# Patient Record
Sex: Male | Born: 1985 | Race: White | Hispanic: No | Marital: Single | Smoking: Current every day smoker
Health system: Southern US, Community
[De-identification: ages and names within clinical notes are randomized; demographics above are authoritative.]

## PROBLEM LIST (undated history)

## (undated) DIAGNOSIS — F32A Depression, unspecified: Secondary | ICD-10-CM

## (undated) DIAGNOSIS — F1910 Other psychoactive substance abuse, uncomplicated: Secondary | ICD-10-CM

## (undated) DIAGNOSIS — F419 Anxiety disorder, unspecified: Secondary | ICD-10-CM

## (undated) DIAGNOSIS — R45851 Suicidal ideations: Secondary | ICD-10-CM

## (undated) DIAGNOSIS — F1010 Alcohol abuse, uncomplicated: Secondary | ICD-10-CM

## (undated) HISTORY — DX: Alcohol abuse, uncomplicated: F10.10

## (undated) HISTORY — PX: SHOULDER SURGERY: SHX246

## (undated) HISTORY — DX: Other psychoactive substance abuse, uncomplicated: F19.10

## (undated) SURGERY — Surgical Case
Anesthesia: *Unknown

---

## 2015-07-24 ENCOUNTER — Emergency Department (HOSPITAL_COMMUNITY)
Admission: EM | Admit: 2015-07-24 | Discharge: 2015-07-24 | Disposition: A | Discharge status: Home / Self Care | Payer: Self-pay | Attending: Emergency Medicine | Admitting: Emergency Medicine

## 2015-07-24 ENCOUNTER — Encounter (HOSPITAL_COMMUNITY): Payer: Self-pay | Admitting: Emergency Medicine

## 2015-07-24 DIAGNOSIS — Z79899 Other long term (current) drug therapy: Secondary | ICD-10-CM | POA: Insufficient documentation

## 2015-07-24 DIAGNOSIS — F17200 Nicotine dependence, unspecified, uncomplicated: Secondary | ICD-10-CM | POA: Insufficient documentation

## 2015-07-24 DIAGNOSIS — Z760 Encounter for issue of repeat prescription: Secondary | ICD-10-CM | POA: Insufficient documentation

## 2015-07-24 DIAGNOSIS — F419 Anxiety disorder, unspecified: Secondary | ICD-10-CM | POA: Insufficient documentation

## 2015-07-24 HISTORY — DX: Anxiety disorder, unspecified: F41.9

## 2015-07-24 MED ORDER — GABAPENTIN 600 MG PO TABS: 600.0000 mg | ORAL_TABLET | Freq: Three times a day (TID) | ORAL | Status: DC

## 2015-07-24 NOTE — Discharge Instructions (Signed)
Do not hesitate to return to the emergency room for any new, worsening or concerning symptoms.  Please obtain primary care using resource guide below. Let them know that you were seen in the emergency room and that they will need to obtain records for further outpatient management.   Medicine Refill at the Emergency Department We have refilled your medicine today, but it is best for you to get refills through your primary health care provider's office. In the future, please plan ahead so you do not need to get refills from the emergency department. If the medicine we refilled was a maintenance medicine, you may have received only enough to get you by until you are able to see your regular health care provider.   This information is not intended to replace advice given to you by your health care provider. Make sure you discuss any questions you have with your health care provider.   Document Released: 10/09/2003 Document Revised: 07/13/2014 Document Reviewed: 09/29/2013 Elsevier Interactive Patient Education 2016 [employer].   Emergency Department Resource Guide 1) Find a Doctor and Pay Out of Pocket Although you won't have to find out who is covered by your insurance plan, it is a good idea to ask around and get recommendations. You will then need to call the office and see if the doctor you have chosen will accept you as a new patient and what types of options they offer for patients who are self-pay. Some doctors offer discounts or will set up payment plans for their patients who do not have insurance, but you will need to ask so you aren't surprised when you get to your appointment.  2) Contact Your Local Health Department Not all health departments have doctors that can see patients for sick visits, but many do, so it is worth a call to see if yours does. If you don't know where your local health department is, you can check in your phone book. The CDC also has a tool to help you locate  your state's health department, and many state websites also have listings of all of their local health departments.  3) Find a Walk-in Clinic If your illness is not likely to be very severe or complicated, you may want to try a walk in clinic. These are popping up all over the country in pharmacies, drugstores, and shopping centers. They're usually staffed by nurse practitioners or physician assistants that have been trained to treat common illnesses and complaints. They're usually fairly quick and inexpensive. However, if you have serious medical issues or chronic medical problems, these are probably not your best option.  No Primary Care Doctor: - Call Health Connect at  [phone] - they can help you locate a primary care doctor that  accepts your insurance, provides certain services, etc. - Physician Referral Service- [phone]  Chronic Pain Problems: Organization         Address  Phone   Notes  [facility]  [phone] Patients need to be referred by their primary care doctor.   Medication Assistance: Organization         Address  Phone   Notes  [facility] Medication [facility] [address] [place], [place] [address] [phone] --Must be a resident of [facility] -- Must have NO insurance coverage whatsoever (no Medicaid/ Medicare, etc.) -- The pt. MUST have a primary care doctor that directs their care regularly and follows them in the community   MedAssist  (  [phone]) [phone]   [employer]  [phone]    Agencies that provide inexpensive medical care: Organization         Address  Phone   Notes  [facility]  [phone]   [facility]    [phone]) [phone]   [facility] [address] [place], [place] [address] [phone]) [phone]   [facility] [address], [place] [phone]   Planned Parenthood    [phone](336) [phone]   [facility]     [phone]) [phone]   [facility] and [facility]  [address], [place] Phone:  [phone](336) [phone], Fax:  [phone](336) [phone] Hours of Operation:  9 am - 6 pm, M-F.  Also accepts Medicaid/Medicare and self-pay.  [facility]  [address], [place] Phone: [phone]) [phone], Fax: [phone]. Hours of Operation:  8:30 am - 5:30 pm, M-F.  Also accepts Medicaid and self-pay.  [facility] [place] [address], [place] Phone: [phone]) [phone]   [facility] [address], [place], [place] [phone], Ext. [phone] Mondays & Thursdays: 7-9 AM.  First 15 patients are seen on a first come, first serve basis.    Medicaid-accepting [facility] Providers:  Organization         Address  Phone   Notes  [facility] [address], [place] [phone]) [phone] Also accepts self-pay patients.  [facility] [address], [place]  [phone]) [phone]   [facility] [address], [place] [phone]) [phone]   [facility] [address] [place] [address], [place] [phone]) [phone]   Renaye RakersVeita Bland [address], [place]   [phone] Only accepts [place] Access [place] patients after they have their name applied to their card.   Self-Pay (no insurance) in [facility]:  Organization         Address  Phone   Notes  Sickle Cell Patients, [facility] [address], [place] [phone]) [phone]   [facility] [address], [place] [phone]) [phone]   [facility] [place]  [address],  [phone](336) [phone]   [facility]/Dr. Osei-Bonsu  [address] [place] [address], [place] or [phone], [place] [phone](336) [phone] Phone number for both [place] and [place] locations is the same.  [facility] [address], [place] [phone](336)  [phone]   [facility] [address] [place] [address], [place] or [address] [phone]) [phone]) [phone]   [facility] [address], [place] [phone](336) [phone], phone; [phone]) [phone], fax Sees patients 1st and 3rd Saturday of every month.  Must not qualify for public or private insurance (i.e. Medicaid, Medicare, [place] Health Choice, Veterans' Benefits)  Household income should be no more than 200% of the poverty level The clinic cannot treat you if you are pregnant or think you are pregnant  Sexually transmitted diseases are not treated at the clinic.    Dental Care: Organization         Address  Phone  Notes  [facility] Department of [facility] [address], [place] [phone]) [phone] Accepts children up to age 821 who are enrolled in [place] or [place] Health Choice; pregnant women  with a Medicaid card; and children who have applied for Medicaid or [place] Health Choice, but were declined, whose parents can pay a reduced fee at time of service.  [facility] Department of [facility]  [address], [place] [phone] Accepts children up to age 51 who are enrolled in [place] or [place] Health Choice; pregnant women with a Medicaid card; and children who have applied for Medicaid or [place] Health Choice, but were declined, whose parents can pay a reduced fee at time of service.  [facility]  [address] [place], [place] [phone] Patients are seen by appointment only. Walk-ins are not accepted. [facility] will see patients 15 years of age and older. Monday - Tuesday (8am-5pm) Most Wednesdays (8:30-5pm) $30 per visit, cash only  [facility]  [address], [facility] [phone] Patients are seen by appointment only. Walk-ins are not accepted. [facility] will see patients 50 years of age and older. One Wednesday Evening (Monthly: Volunteer  Based).  $30 per visit, cash only  [employer]  [phone] for adults; Children under age 40, call Graduate Pediatric Dentistry at [phone]. Children aged 13-14, please call [phone] to request a pediatric application.  Dental services are provided in all areas of dental care including fillings, crowns and bridges, complete and partial dentures, implants, gum treatment, root canals, and extractions. Preventive care is also provided. Treatment is provided to both adults and children. Patients are selected via a lottery and there is often a waiting list.   [facility] [address] [place]  [phone] [email or web address]   [facility] [address] [place], [place] [phone], Ext. [phone] Second and Fourth Thursday of each month, opens at 6:30 AM; Clinic ends at 9 AM.  Patients are seen on a first-come first-served basis, and a limited number are seen during each clinic.   [facility]  [address] [place] [place], [place] [phone]   Eligibility Requirements You must have lived in [place], [place], or [place] counties for at least the last three months.   You cannot be eligible for state or federal sponsored [place], including [employer], [place], or [employer].   You generally cannot be eligible for healthcare insurance through your employer.    How to apply: Eligibility screenings are held every Tuesday and Wednesday afternoon from 1:00 pm until 4:00 pm. You do not need an appointment for the interview!  [facility] [address] [place], [place] [phone]   [facility] Health Department  [phone]   [facility] Health Department  [phone]   [facility] Health Department  [phone]    Behavioral Health Resources in the Community: Intensive Outpatient Programs Organization         Address  Phone  Notes  [facility]  Services [address] [place], [place] [phone]   [facility] Outpatient [address], [place], [place] [phone]   [facility]: Alcohol [facility] Drug S[facility] [address] [place], [place]  [phone]   [facility] Mental Health [address]  [place], [place] [phone] or [phone]   Substance Abuse Resources Organization         Address  Phone  Notes  [facility] Drug Services  [phone]   [facility]  [phone]   [facility] [place]  [phone]   [facility]  [phone]   Residential & Outpatient Substance Abuse Program  [phone]   Psychological Services Organization         Address  Phone  Notes  [facility]  [phone]   [facility]patient [facility]  [phone]   [facility] Mental Health [address], [place] [phone] or [phone]    Mobile Crisis Teams Organization         Address  Phone  Notes  [facility], Mobile Crisis Care Unit  [phone]   [facility]  [address] [place], [place] [phone]   Doristine Locks [address] [place] [phone]    Self-Help/Support Groups Organization         Address  Phone             Notes  [facility] of [place] - variety of support groups  336- I7437963 Call for more information  Narcotics Anonymous (NA), [facility] [address], [employer] [place]  2 meetings at this location   Statistician         Address  Phone  Notes  [facility] [address],    [place]  [phone]   [facility]  [address] [place] [address], [place], [place] [phone]   [facility] [address] [place], [place] [phone] Admissions: 8am-3pm M-F  [facility] [address],    [place], [place] [phone]   [facility] [address] [place], [place], [place] [phone]    [facility] [address]  [place], [place] [phone]   [facility] - Intensive Outpatient [address], [place], [place] [phone]   [facility] [address]  [place], [place] [phone] or [phone]   [facility] ([facility]) [address], [place], [place] [phone] Accepts Medicaid  [facility] [place] [address],  [place] [phone] Substance Abuse/Addiction Treatment   [facility] Organization         Address  Phone  Notes  [facility]  [phone]   Angie Fava, PhD [address] [place], [place]   ([phone]) [phone] or [phone]   [facility]   [address] [place], [place] [phone]   [facility] [address] [place], [place] [phone] Insurance/Medicaid/sponsorship through [facility] and Families [address]                                    [place], [place] [phone] Therapy/tele-psych/case  [facility] [address], [place] [phone]    Dr. Lolly Mustache  [phone]   [facility]  [employer] [facility]. 1) [address], [place] 2) [address], [place] 3)  [address], [place] [phone]  [phone]   [facility] Child Abuse Hotline (530)140-7983 or [phone] (After Hours)

## 2015-07-24 NOTE — ED Notes (Signed)
Per pt, states he needs a refill on his gabapentin

## 2015-07-24 NOTE — ED Provider Notes (Signed)
CSN: [id number]     Arrival date & time 07/24/15  1424 History  By signing my name below, I, Justin Fisher, attest that this documentation has been prepared under the direction and in the presence of  [employer], PA-C. Electronically Signed: Marica Fisher, ED Scribe. 07/24/2015. 3:32 PM.  Chief Complaint  Patient presents with  . Medication Refill   The history is provided by the patient. No language interpreter was used.   PCP: No primary care provider on file. HPI Comments: Justin Fisher is a 30 y.o. male, with PMHx of anxiety, who presents to the Emergency Department for refill of gabapentin which was Rx by pt's out of town psychiatrist to treat anxiety. Pt reports he takes 900 mg x3 of gabapentin daily. Pt notes he recently moved to the area and is looking to establish care with a local psychiatrist. Pt denies any chronic health conditions. Pt further denies SI, HI, or any other Sx at this time.   Past Medical History  Diagnosis Date  . Anxiety    History reviewed. No pertinent past surgical history. No family history on file. Social History  Substance Use Topics  . Smoking status: Current Every Day Smoker  . Smokeless tobacco: None  . Alcohol Use: No    Review of Systems A complete 10 system review of systems was obtained and all systems are negative except as noted in the HPI and PMH.    Allergies  Review of patient's allergies indicates not on file.  Home Medications   Prior to Admission medications   Medication Sig Start Date End Date Taking? Authorizing Provider  gabapentin (NEURONTIN) 600 MG tablet Take 1 tablet (600 mg total) by mouth 3 (three) times daily. 07/24/15   Eoghan Belcher, PA-C   Triage Vitals: BP 131/82 mmHg  Pulse 93  Temp(Src) 98.1 F (36.7 C) (Oral)  Resp 16  SpO2 100% Physical Exam  Constitutional: He is oriented to person, place, and time. He appears well-developed and well-nourished. No distress.  HENT:  Head: Normocephalic and  atraumatic.  Mouth/Throat: Oropharynx is clear and moist.  Eyes: Conjunctivae and EOM are normal. Pupils are equal, round, and reactive to light.  Neck: Normal range of motion.  Cardiovascular: Normal rate, regular rhythm and intact distal pulses.   Pulmonary/Chest: Effort normal and breath sounds normal.  Abdominal: Soft. He exhibits no distension. There is no tenderness.  Musculoskeletal: Normal range of motion.  Neurological: He is alert and oriented to person, place, and time.  Skin: Skin is warm. He is not diaphoretic.  Psychiatric: He has a normal mood and affect.  Nursing note and vitals reviewed.   ED Course  Procedures (including critical care time) DIAGNOSTIC STUDIES: Oxygen Saturation is 100% on ra, nl by my interpretation.    COORDINATION OF CARE: 3:28 PM: Discussed treatment plan which includes refill of gabapentin and resource guide with pt at bedside; patient verbalizes understanding and agrees with treatment plan.   MDM   Final diagnoses:  Medication refill    Filed Vitals:   07/24/15 1442 07/24/15 1541  BP: 131/82 140/92  Pulse: 93 97  Temp: 98.1 F (36.7 C) 98.1 F (36.7 C)  TempSrc: Oral Oral  Resp: 16 18  SpO2: 100% 96%     Briyan Kleven is 30 y.o. male requesting med refill of gabapentin. Patient states that he takes it for anxiety, does not appear acutely anxious. No psychiatric issues that would require emergent intervention. Patient recently moved to the area, advised him we  would not continue to fill his gabapentin out of the ED, patient is given referral to [facility] and resource guide to establish primary care. History of present illness denies suicidal ideation, the  Evaluation does not show pathology that would require ongoing emergent intervention or inpatient treatment. Pt is hemodynamically stable and mentating appropriately. Discussed findings and plan with patient/guardian, who agrees with care plan. All questions answered. Return  precautions discussed and outpatient follow up given.   Discharge Medication List as of 07/24/2015  3:30 PM    START taking these medications   Details  gabapentin (NEURONTIN) 600 MG tablet Take 1 tablet (600 mg total) by mouth 3 (three) times daily., Starting 07/24/2015, Until Discontinued, Print         .   Wynetta Emery, PA-C 07/24/15 1554  Wynetta Emery, PA-C 07/24/15 1653  Wynetta Emery, PA-C 07/24/15 1700  Gerhard Munch, MD 07/25/15 [phone]

## 2015-09-01 ENCOUNTER — Emergency Department (HOSPITAL_COMMUNITY)
Admission: EM | Admit: 2015-09-01 | Discharge: 2015-09-01 | Disposition: A | Discharge status: Home / Self Care | Payer: BLUE CROSS/BLUE SHIELD | Attending: Emergency Medicine | Admitting: Emergency Medicine

## 2015-09-01 ENCOUNTER — Encounter (HOSPITAL_COMMUNITY): Payer: Self-pay | Admitting: Emergency Medicine

## 2015-09-01 DIAGNOSIS — F17200 Nicotine dependence, unspecified, uncomplicated: Secondary | ICD-10-CM | POA: Diagnosis not present

## 2015-09-01 DIAGNOSIS — F419 Anxiety disorder, unspecified: Secondary | ICD-10-CM | POA: Insufficient documentation

## 2015-09-01 DIAGNOSIS — M5412 Radiculopathy, cervical region: Secondary | ICD-10-CM | POA: Diagnosis not present

## 2015-09-01 DIAGNOSIS — Z79899 Other long term (current) drug therapy: Secondary | ICD-10-CM | POA: Diagnosis not present

## 2015-09-01 DIAGNOSIS — M79644 Pain in right finger(s): Secondary | ICD-10-CM | POA: Diagnosis present

## 2015-09-01 MED ORDER — NAPROXEN 500 MG PO TABS: 500.0000 mg | ORAL_TABLET | Freq: Two times a day (BID) | ORAL | Status: DC

## 2015-09-01 NOTE — Discharge Instructions (Signed)
Cervical Radiculopathy Cervical radiculopathy happens when a nerve in the neck (cervical nerve) is pinched or bruised. This condition can develop because of an injury or as part of the normal aging process. Pressure on the cervical nerves can cause pain or numbness that runs from the neck all the way down into the arm and fingers. Usually, this condition gets better with rest. Treatment may be needed if the condition does not improve.  CAUSES This condition may be caused by:  Injury.  Slipped (herniated) disk.  Muscle tightness in the neck because of overuse.  Arthritis.  Breakdown or degeneration in the bones and joints of the spine (spondylosis) due to aging.  Bone spurs that may develop near the cervical nerves. SYMPTOMS Symptoms of this condition include:  Pain that runs from the neck to the arm and hand. The pain can be severe or irritating. It may be worse when the neck is moved.  Numbness or weakness in the affected arm and hand. DIAGNOSIS This condition may be diagnosed based on symptoms, medical history, and a physical exam. You may also have tests, including:  X-rays.  CT scan.  MRI.  Electromyogram (EMG).  Nerve conduction tests. TREATMENT In many cases, treatment is not needed for this condition. With rest, the condition usually gets better over time. If treatment is needed, options may include:  Wearing a soft neck collar for short periods of time.  Physical therapy to strengthen your neck muscles.  Medicines, such as NSAIDs, oral corticosteroids, or spinal injections.  Surgery. This may be needed if other treatments do not help. Various types of surgery may be done depending on the cause of your problems. HOME CARE INSTRUCTIONS Managing Pain  Take over-the-counter and prescription medicines only as told by your health care provider.  If directed, apply ice to the affected area.  Put ice in a plastic bag.  Place a towel between your skin and the  bag.  Leave the ice on for 20 minutes, 2-3 times per day.  If ice does not help, you can try using heat. Take a warm shower or warm bath, or use a heat pack as told by your health care provider.  Try a gentle neck and shoulder massage to help relieve symptoms. Activity  Rest as needed. Follow instructions from your health care provider about any restrictions on activities.  Do stretching and strengthening exercises as told by your health care provider or physical therapist. General Instructions  If you were given a soft collar, wear it as told by your health care provider.  Use a flat pillow when you sleep.  Keep all follow-up visits as told by your health care provider. This is important. SEEK MEDICAL CARE IF:  Your condition does not improve with treatment. SEEK IMMEDIATE MEDICAL CARE IF:  Your pain gets much worse and cannot be controlled with medicines.  You have weakness or numbness in your hand, arm, face, or leg.  You have a high fever.  You have a stiff, rigid neck.  You lose control of your bowels or your bladder (have incontinence).  You have trouble with walking, balance, or speaking.   This information is not intended to replace advice given to you by your health care provider. Make sure you discuss any questions you have with your health care provider.   Document Released: 03/17/2001 Document Revised: 03/13/2015 Document Reviewed: 08/16/2014 Elsevier Interactive Patient Education 2016 [employer]  Radicular Pain Radicular pain in either the arm or leg is usually from  a bulging or herniated disk in the spine. A piece of the herniated disk may press against the nerves as the nerves exit the spine. This causes pain which is felt at the tips of the nerves down the arm or leg. Other causes of radicular pain may include:  Fractures.  Heart disease.  Cancer.  An abnormal and usually degenerative state of the nervous system or nerves (neuropathy). Diagnosis  may require CT or MRI scanning to determine the primary cause.  Nerves that start at the neck (nerve roots) may cause radicular pain in the outer shoulder and arm. It can spread down to the thumb and fingers. The symptoms vary depending on which nerve root has been affected. In most cases radicular pain improves with conservative treatment. Neck problems may require physical therapy, a neck collar, or cervical traction. Treatment may take many weeks, and surgery may be considered if the symptoms do not improve.  Conservative treatment is also recommended for sciatica. Sciatica causes pain to radiate from the lower back or buttock area down the leg into the foot. Often there is a history of back problems. Most patients with sciatica are better after 2 to 4 weeks of rest and other supportive care. Short term bed rest can reduce the disk pressure considerably. Sitting, however, is not a good position since this increases the pressure on the disk. You should avoid bending, lifting, and all other activities which make the problem worse. Traction can be used in severe cases. Surgery is usually reserved for patients who do not improve within the first months of treatment. Only take over-the-counter or prescription medicines for pain, discomfort, or fever as directed by your caregiver. Narcotics and muscle relaxants may help by relieving more severe pain and spasm and by providing mild sedation. Cold or massage can give significant relief. Spinal manipulation is not recommended. It can increase the degree of disc protrusion. Epidural steroid injections are often effective treatment for radicular pain. These injections deliver medicine to the spinal nerve in the space between the protective covering of the spinal cord and back bones (vertebrae). Your caregiver can give you more information about steroid injections. These injections are most effective when given within two weeks of the onset of pain.  You should see your  caregiver for follow up care as recommended. A program for neck and back injury rehabilitation with stretching and strengthening exercises is an important part of management.  SEEK IMMEDIATE MEDICAL CARE IF:  You develop increased pain, weakness, or numbness in your arm or leg.  You develop difficulty with bladder or bowel control.  You develop abdominal pain.   This information is not intended to replace advice given to you by your health care provider. Make sure you discuss any questions you have with your health care provider.  Follow up with PCP if symptoms do not improve. Continue taking home gabapentin. Wear wrist brace. Take naprosyn for pain. Return to the ED if you experience severe worsening of your symptoms, extremity weakness, facial drooping, slurred speech, discoloration of your extremity.

## 2015-09-01 NOTE — ED Notes (Signed)
Patient presents for numbness and tingling to second and third fingers on right hand x1 day. No known injury to same. Neurologically intact.

## 2015-09-01 NOTE — ED Provider Notes (Signed)
CSN: [id number]     Arrival date & time 09/01/15  1641 History  By signing my name below, I, Linna Darner, attest that this documentation has been prepared under the direction and in the presence of non-physician practitioner, Gaylyn Rong, PA-C. Electronically Signed: Linna Darner, Scribe. 09/01/2015. 5:02 PM.    No chief complaint on file.   The history is provided by the patient. No language interpreter was used.     HPI Comments: Justin Fisher is a 30 y.o. male with no significant PMHx who presents to the Emergency Department complaining of sudden onset, worsening, pain and numbness in his right middle and index fingers since yesterday. Pt states that he was watching TV when his right middle and index fingers suddenly began tingling and feeling painful; he states it feels like he has "pins and needles" poking his right middle and index fingers. Pt is able to move is fingers, no decreased sensation. He states that this has never happened in the past. Pt takes gabapentin 800 mg x4 daily for his anxiety and reports that he has been taking his medication regularly. Pt has taken ibuprofen for his finger tingling and pain without relief. He reports that he is able to pick things up with his right hand and flex his fingers; he notes that his pain is not worse with wrist flexion. 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 He reports that he has never injured his neck. Denies history of neck injury. He denies neck pain, weakness, or any other associated symptoms at this time.   Past Medical History  Diagnosis Date  . Anxiety    History reviewed. No pertinent past surgical history. No family history on file. Social History  Substance Use Topics  . Smoking status: Current Every Day Smoker -- 0.50 packs/day  . Smokeless tobacco: None  . Alcohol Use: No    Review of Systems  All other systems reviewed and are negative.     Allergies  Review of patient's allergies indicates no  known allergies.  Home Medications   Prior to Admission medications   Medication Sig Start Date End Date Taking? Authorizing Provider  gabapentin (NEURONTIN) 600 MG tablet Take 1 tablet (600 mg total) by mouth 3 (three) times daily. 07/24/15   Nicole Pisciotta, PA-C   BP 121/52 mmHg  Pulse 85  Temp(Src) 98.4 F (36.9 C) (Oral)  Resp 18  SpO2 96% Physical Exam  Constitutional: He is oriented to person, place, and time. He appears well-developed and well-nourished. No distress.  HENT:  Head: Normocephalic and atraumatic.  Eyes: Conjunctivae are normal. Right eye exhibits no discharge. Left eye exhibits no discharge. No scleral icterus.  Neck: Normal range of motion. Neck supple.  Cardiovascular: Normal rate.   Pulmonary/Chest: Effort normal.  Musculoskeletal:  Negative Phalen's test. Intact distal pulses. No evidence of tendon injury. Positive Tinel's. No evidence of tendon injury. No decrease ROM of digits. Intact distal pulses. Negative Allen's test.    Neurological: He is alert and oriented to person, place, and time. No cranial nerve deficit. Coordination normal.  Strength 5/5 throughout. No sensory deficits.  Normal finger to nose. No facial droop. No pronator drift.   Skin: Skin is warm and dry. No rash noted. He is not diaphoretic. No erythema. No pallor.  Psychiatric: He has a normal mood and affect. His behavior is normal.  Nursing note and vitals reviewed.   ED Course  Procedures (including critical care time)  DIAGNOSTIC STUDIES: Oxygen Saturation is  96% on RA, adequate by my interpretation.    COORDINATION OF CARE: 5:02 PM Discussed treatment plan with pt at bedside and pt agreed to plan.   Labs Review Labs Reviewed - No data to display  Imaging Review No results found. I have personally reviewed and evaluated these images and lab results as part of my medical decision-making.   EKG Interpretation None      MDM   Final diagnoses:  Cervical  radiculopathy   30 year old male with past medical history of multiple psych disorders presents the ED with tingling to right third and fourth digits onset 2 days ago. No trauma or injury. No neck pain. No neurological deficits on exam. Positive Tinel sign. Tingling appears to be in C7 distribution. Suspect this is a cervical radiculopathy, possible carpal tunnel although sensation does not increase with wrist flexion. Patient is currently taking gabapentin, he has been taking this medication for several years for his anxiety. We will give patient wrist brace to wear especially while sleeping. Recommend NSAIDs. Patient will follow-up with his primary care doctor if symptoms not improve in the next week. Return precautions outlined in patient discharge instructions.  I personally performed the services described in this documentation, which was scribed in my presence. The recorded information has been reviewed and is accurate.       Lester Kinsman [place], PA-C 09/02/15 2215  Marily Memos, MD 09/04/15 [phone]

## 2015-09-10 ENCOUNTER — Emergency Department (HOSPITAL_COMMUNITY)
Admission: EM | Admit: 2015-09-10 | Discharge: 2015-09-11 | Disposition: A | Discharge status: Other Acute Inpatient Hospital | Payer: BLUE CROSS/BLUE SHIELD | Attending: Physician Assistant | Admitting: Physician Assistant

## 2015-09-10 ENCOUNTER — Encounter (HOSPITAL_COMMUNITY): Payer: Self-pay | Admitting: Emergency Medicine

## 2015-09-10 DIAGNOSIS — R45851 Suicidal ideations: Secondary | ICD-10-CM

## 2015-09-10 DIAGNOSIS — J069 Acute upper respiratory infection, unspecified: Secondary | ICD-10-CM | POA: Insufficient documentation

## 2015-09-10 DIAGNOSIS — Z79899 Other long term (current) drug therapy: Secondary | ICD-10-CM | POA: Diagnosis not present

## 2015-09-10 DIAGNOSIS — F17200 Nicotine dependence, unspecified, uncomplicated: Secondary | ICD-10-CM | POA: Diagnosis not present

## 2015-09-10 DIAGNOSIS — F1010 Alcohol abuse, uncomplicated: Secondary | ICD-10-CM | POA: Insufficient documentation

## 2015-09-10 DIAGNOSIS — Z791 Long term (current) use of non-steroidal anti-inflammatories (NSAID): Secondary | ICD-10-CM | POA: Diagnosis not present

## 2015-09-10 DIAGNOSIS — F419 Anxiety disorder, unspecified: Secondary | ICD-10-CM | POA: Diagnosis not present

## 2015-09-10 LAB — COMPREHENSIVE METABOLIC PANEL
ALK PHOS: 73 U/L (ref 38–126)
ALT: 37 U/L (ref 17–63)
AST: 32 U/L (ref 15–41)
Albumin: 3.8 g/dL (ref 3.5–5.0)
Anion gap: 12 (ref 5–15)
BILIRUBIN TOTAL: 0.4 mg/dL (ref 0.3–1.2)
BUN: 7 mg/dL (ref 6–20)
CALCIUM: 9.3 mg/dL (ref 8.9–10.3)
CO2: 24 mmol/L (ref 22–32)
CREATININE: 0.84 mg/dL (ref 0.61–1.24)
Chloride: 104 mmol/L (ref 101–111)
Glucose, Bld: 78 mg/dL (ref 65–99)
Potassium: 4 mmol/L (ref 3.5–5.1)
Sodium: 140 mmol/L (ref 135–145)
Total Protein: 6.7 g/dL (ref 6.5–8.1)

## 2015-09-10 LAB — RAPID URINE DRUG SCREEN, HOSP PERFORMED
Amphetamines: NOT DETECTED
Barbiturates: NOT DETECTED
Benzodiazepines: NOT DETECTED
Cocaine: NOT DETECTED
OPIATES: NOT DETECTED
TETRAHYDROCANNABINOL: NOT DETECTED

## 2015-09-10 LAB — CBC
HCT: 43.3 % (ref 39.0–52.0)
Hemoglobin: 14.6 g/dL (ref 13.0–17.0)
MCH: 28.3 pg (ref 26.0–34.0)
MCHC: 33.7 g/dL (ref 30.0–36.0)
MCV: 84.1 fL (ref 78.0–100.0)
PLATELETS: 317 10*3/uL (ref 150–400)
RBC: 5.15 MIL/uL (ref 4.22–5.81)
RDW: 14 % (ref 11.5–15.5)
WBC: 9.4 10*3/uL (ref 4.0–10.5)

## 2015-09-10 LAB — ACETAMINOPHEN LEVEL: Acetaminophen (Tylenol), Serum: <10 ug/mL — ABNORMAL LOW (ref 10–30)

## 2015-09-10 LAB — ETHANOL: ALCOHOL ETHYL (B): 78 mg/dL — AB (ref <5)

## 2015-09-10 LAB — SALICYLATE LEVEL

## 2015-09-10 MED ORDER — NICOTINE 21 MG/24HR TD PT24: 21.0000 mg | MEDICATED_PATCH | Freq: Once | TRANSDERMAL | Status: DC

## 2015-09-10 MED ORDER — IBUPROFEN 400 MG PO TABS: 600.0000 mg | ORAL_TABLET | Freq: Three times a day (TID) | ORAL | Status: DC | PRN

## 2015-09-10 MED ORDER — ONDANSETRON HCL 4 MG PO TABS: 4.0000 mg | ORAL_TABLET | Freq: Three times a day (TID) | ORAL | Status: DC | PRN

## 2015-09-10 MED ORDER — ACETAMINOPHEN 325 MG PO TABS: 650.0000 mg | ORAL_TABLET | ORAL | Status: DC | PRN

## 2015-09-10 NOTE — ED Notes (Signed)
Staffing notified for sitter 

## 2015-09-10 NOTE — ED Notes (Signed)
Pt wanded by security. 

## 2015-09-10 NOTE — ED Provider Notes (Signed)
CSN: [id number]     Arrival date & time 09/10/15  1428 History   First MD Initiated Contact with Patient 09/10/15 2043     Chief Complaint  Patient presents with  . Suicidal  . Alcohol Problem  . Medical Clearance     (Consider location/radiation/quality/duration/timing/severity/associated sxs/prior Treatment) HPI   Patient is a 30 year old male with history of depression and suicidal ideation.. Patient has had psychiatric hospitalization in [place]. Presenting today with suicidal ideation. Patient said he drank and then had suicidal thoughts over the past 2-3 days. Patient on multiple medications and reports he has been taking them.  Patient is losing his voice and has mild sore throat. Has been eating drinking normally. No fevers.  Past Medical History  Diagnosis Date  . Anxiety    History reviewed. No pertinent past surgical history. No family history on file. Social History  Substance Use Topics  . Smoking status: Current Every Day Smoker -- 0.50 packs/day  . Smokeless tobacco: None  . Alcohol Use: Yes    Review of Systems  Constitutional: Negative for fever and activity change.  HENT: Positive for sore throat.   Respiratory: Negative for cough and shortness of breath.   Cardiovascular: Negative for chest pain.  Gastrointestinal: Negative for abdominal pain.  Genitourinary: Negative for dysuria and urgency.  Musculoskeletal: Negative for arthralgias.  Allergic/Immunologic: Negative for immunocompromised state.  Psychiatric/Behavioral: Positive for suicidal ideas. Negative for behavioral problems and agitation.  All other systems reviewed and are negative.     Allergies  Review of patient's allergies indicates no known allergies.  Home Medications   Prior to Admission medications   Medication Sig Start Date End Date Taking? Authorizing Provider  gabapentin (NEURONTIN) 600 MG tablet Take 1 tablet (600 mg total) by mouth 3 (three) times daily. 07/24/15   Nicole  Pisciotta, PA-C  hydrOXYzine (VISTARIL) 50 MG capsule Take 50 mg by mouth 4 (four) times daily as needed.    Historical Provider, MD  lithium 600 MG capsule Take 600 mg by mouth 2 (two) times daily.    Historical Provider, MD  naproxen (NAPROSYN) 500 MG tablet Take 1 tablet (500 mg total) by mouth 2 (two) times daily. 09/01/15   Samantha Tripp [place], PA-C  traZODone (DESYREL) 100 MG tablet Take 200 mg by mouth every evening.    Historical Provider, MD  venlafaxine XR (EFFEXOR-XR) 150 MG 24 hr capsule Take 150 mg by mouth daily.    Historical Provider, MD   BP 113/67 mmHg  Pulse 86  Temp(Src) 98.4 F (36.9 C) (Oral)  Resp 18  Ht 5\' 7"  (1.702 m)  Wt 232 lb 14.4 oz (105.643 kg)  BMI 36.47 kg/m2  SpO2 95% Physical Exam  Constitutional: He is oriented to person, place, and time. He appears well-nourished.  HENT:  Head: Normocephalic.  Mouth/Throat: Oropharynx is clear and moist.  Eyes: Conjunctivae are normal.  Neck: No tracheal deviation present.  Cardiovascular: Normal rate.   Pulmonary/Chest: Effort normal. No stridor. No respiratory distress.  Abdominal: Soft. There is no tenderness. There is no guarding.  Musculoskeletal: Normal range of motion. He exhibits no edema.  Neurological: He is oriented to person, place, and time. No cranial nerve deficit.  Skin: Skin is warm and dry. No rash noted. He is not diaphoretic.  Psychiatric: He has a normal mood and affect. His behavior is normal.  Nursing note and vitals reviewed.   ED Course  Procedures (including critical care time) Labs Review Labs Reviewed  ETHANOL - Abnormal;  Notable for the following:    Alcohol, Ethyl (B) 78 (*)    All other components within normal limits  ACETAMINOPHEN LEVEL - Abnormal; Notable for the following:    Acetaminophen (Tylenol), Serum <10 (*)    All other components within normal limits  COMPREHENSIVE METABOLIC PANEL  SALICYLATE LEVEL  CBC  URINE RAPID DRUG SCREEN, HOSP PERFORMED    Imaging  Review No results found. I have personally reviewed and evaluated these images and lab results as part of my medical decision-making.   EKG Interpretation None      MDM   Final diagnoses:  None  Patient is a 31 year old male with history of psychiatric disorder, depression. Patient on multiple medications for this. Patient had psychiatric hospitalizations in the past. He reports today with 2-3 days of increasing suicidal ideation. Patient will need to be evaluated by TTS.  Patient has mild sore throat, nothing to do medically for this at this point.    Rosland Riding Randall An, MD 09/10/15 2150

## 2015-09-10 NOTE — ED Notes (Signed)
Pt c/o suicidal thoughts. Plan is to drive car off the highway. Pt c/o thoughts since yesterday. Pt states "ive been drinking for two weeks now." Pt states last drink was an hour ago. 5 beers. Denies drug use.

## 2015-09-11 ENCOUNTER — Inpatient Hospital Stay (HOSPITAL_COMMUNITY)
Admission: EM | Admit: 2015-09-11 | Discharge: 2015-09-17 | DRG: 885 | Disposition: A | Discharge status: Home / Self Care | Payer: BLUE CROSS/BLUE SHIELD | Source: Intra-hospital | Attending: Psychiatry | Admitting: Psychiatry

## 2015-09-11 ENCOUNTER — Encounter (HOSPITAL_COMMUNITY): Payer: Self-pay | Admitting: *Deleted

## 2015-09-11 DIAGNOSIS — F17210 Nicotine dependence, cigarettes, uncomplicated: Secondary | ICD-10-CM | POA: Diagnosis present

## 2015-09-11 DIAGNOSIS — Z79899 Other long term (current) drug therapy: Secondary | ICD-10-CM

## 2015-09-11 DIAGNOSIS — R45851 Suicidal ideations: Secondary | ICD-10-CM | POA: Diagnosis present

## 2015-09-11 DIAGNOSIS — G4700 Insomnia, unspecified: Secondary | ICD-10-CM | POA: Diagnosis present

## 2015-09-11 DIAGNOSIS — F419 Anxiety disorder, unspecified: Secondary | ICD-10-CM | POA: Diagnosis present

## 2015-09-11 DIAGNOSIS — F1994 Other psychoactive substance use, unspecified with psychoactive substance-induced mood disorder: Secondary | ICD-10-CM | POA: Diagnosis present

## 2015-09-11 DIAGNOSIS — F1020 Alcohol dependence, uncomplicated: Secondary | ICD-10-CM | POA: Diagnosis present

## 2015-09-11 DIAGNOSIS — F332 Major depressive disorder, recurrent severe without psychotic features: Secondary | ICD-10-CM | POA: Diagnosis present

## 2015-09-11 MED ORDER — ADULT MULTIVITAMIN W/MINERALS CH
ORAL_TABLET | ORAL | Status: AC
  Administered 2015-09-11: 1
  Filled 2015-09-11: qty 1

## 2015-09-11 MED ORDER — CHLORDIAZEPOXIDE HCL 25 MG PO CAPS
ORAL_CAPSULE | ORAL | Status: AC
Start: 2015-09-11 — End: 2015-09-11
  Administered 2015-09-11: 25 mg
  Filled 2015-09-11: qty 1

## 2015-09-11 MED ORDER — CHLORDIAZEPOXIDE HCL 25 MG PO CAPS: 25.0000 mg | ORAL_CAPSULE | Freq: Four times a day (QID) | ORAL | Status: DC | PRN

## 2015-09-11 MED ORDER — THIAMINE HCL 100 MG/ML IJ SOLN: 100.0000 mg | Freq: Once | INTRAMUSCULAR | Status: DC

## 2015-09-11 MED ORDER — TRAZODONE HCL 100 MG PO TABS
200.0000 mg | ORAL_TABLET | Freq: Every day | ORAL | Status: DC
  Administered 2015-09-11 – 2015-09-14 (×4): 200 mg via ORAL
  Filled 2015-09-11 (×7): qty 2

## 2015-09-11 MED ORDER — GABAPENTIN 800 MG PO TABS
800.0000 mg | ORAL_TABLET | Freq: Three times a day (TID) | ORAL | Status: DC
  Administered 2015-09-11 – 2015-09-17 (×22): 800 mg via ORAL
  Filled 2015-09-11 (×29): qty 1

## 2015-09-11 MED ORDER — CHLORDIAZEPOXIDE HCL 25 MG PO CAPS
25.0000 mg | ORAL_CAPSULE | Freq: Four times a day (QID) | ORAL | Status: AC
  Administered 2015-09-11 (×2): 25 mg via ORAL
  Filled 2015-09-11 (×2): qty 1

## 2015-09-11 MED ORDER — MAGNESIUM HYDROXIDE 400 MG/5ML PO SUSP
30.0000 mL | Freq: Every day | ORAL | Status: DC | PRN
Start: 2015-09-11 — End: 2015-09-17

## 2015-09-11 MED ORDER — NICOTINE POLACRILEX 2 MG MT GUM
2.0000 mg | CHEWING_GUM | OROMUCOSAL | Status: DC | PRN
  Administered 2015-09-11 – 2015-09-17 (×14): 2 mg via ORAL
  Filled 2015-09-11 (×5): qty 1

## 2015-09-11 MED ORDER — HYDROXYZINE HCL 25 MG PO TABS
25.0000 mg | ORAL_TABLET | Freq: Four times a day (QID) | ORAL | Status: AC | PRN
  Administered 2015-09-13 (×2): 25 mg via ORAL
  Filled 2015-09-11 (×3): qty 1

## 2015-09-11 MED ORDER — ACETAMINOPHEN 325 MG PO TABS: 650.0000 mg | ORAL_TABLET | Freq: Four times a day (QID) | ORAL | Status: DC | PRN

## 2015-09-11 MED ORDER — CHLORDIAZEPOXIDE HCL 25 MG PO CAPS: 25.0000 mg | ORAL_CAPSULE | Freq: Four times a day (QID) | ORAL | Status: AC | PRN

## 2015-09-11 MED ORDER — ALUM & MAG HYDROXIDE-SIMETH 200-200-20 MG/5ML PO SUSP
30.0000 mL | ORAL | Status: DC | PRN
Start: 2015-09-11 — End: 2015-09-17

## 2015-09-11 MED ORDER — ADULT MULTIVITAMIN W/MINERALS CH
1.0000 | ORAL_TABLET | Freq: Every day | ORAL | Status: DC
  Administered 2015-09-12 – 2015-09-17 (×5): 1 via ORAL
  Filled 2015-09-11 (×9): qty 1

## 2015-09-11 MED ORDER — NAPROXEN 500 MG PO TABS
500.0000 mg | ORAL_TABLET | Freq: Two times a day (BID) | ORAL | Status: DC
Start: 2015-09-11 — End: 2015-09-17
  Administered 2015-09-11 – 2015-09-17 (×11): 500 mg via ORAL
  Filled 2015-09-11 (×15): qty 1

## 2015-09-11 MED ORDER — CHLORDIAZEPOXIDE HCL 25 MG PO CAPS
25.0000 mg | ORAL_CAPSULE | ORAL | Status: AC
  Administered 2015-09-13: 25 mg via ORAL
  Filled 2015-09-11: qty 1

## 2015-09-11 MED ORDER — CHLORDIAZEPOXIDE HCL 25 MG PO CAPS
25.0000 mg | ORAL_CAPSULE | Freq: Every day | ORAL | Status: AC
  Administered 2015-09-14: 25 mg via ORAL
  Filled 2015-09-11: qty 1

## 2015-09-11 MED ORDER — CHLORDIAZEPOXIDE HCL 25 MG PO CAPS
25.0000 mg | ORAL_CAPSULE | Freq: Three times a day (TID) | ORAL | Status: AC
  Administered 2015-09-12 (×3): 25 mg via ORAL
  Filled 2015-09-11 (×3): qty 1

## 2015-09-11 MED ORDER — VITAMIN B-1 100 MG PO TABS
100.0000 mg | ORAL_TABLET | Freq: Every day | ORAL | Status: DC
  Administered 2015-09-12 – 2015-09-17 (×5): 100 mg via ORAL
  Filled 2015-09-11 (×8): qty 1

## 2015-09-11 MED ORDER — LOPERAMIDE HCL 2 MG PO CAPS: 2.0000 mg | ORAL_CAPSULE | ORAL | Status: AC | PRN

## 2015-09-11 MED ORDER — HYDROXYZINE PAMOATE 50 MG PO CAPS
50.0000 mg | ORAL_CAPSULE | Freq: Four times a day (QID) | ORAL | Status: DC | PRN
  Filled 2015-09-11: qty 1

## 2015-09-11 MED ORDER — ONDANSETRON 4 MG PO TBDP: 4.0000 mg | ORAL_TABLET | Freq: Four times a day (QID) | ORAL | Status: AC | PRN

## 2015-09-11 MED ORDER — GABAPENTIN 600 MG PO TABS
600.0000 mg | ORAL_TABLET | Freq: Three times a day (TID) | ORAL | Status: DC
  Administered 2015-09-11: 600 mg via ORAL
  Filled 2015-09-11 (×4): qty 1

## 2015-09-11 MED ORDER — NICOTINE 14 MG/24HR TD PT24
14.0000 mg | MEDICATED_PATCH | Freq: Every day | TRANSDERMAL | Status: DC
  Filled 2015-09-11 (×2): qty 1

## 2015-09-11 NOTE — Progress Notes (Signed)
NUTRITION ASSESSMENT  Pt identified as at risk on the Malnutrition Screen Tool  INTERVENTION: 1. Educated patient on the importance of nutrition and encouraged intake of food and beverages. 2. Discussed weight goals. 3. Supplements: none at this time  NUTRITION DIAGNOSIS: No nutrition dx at this time.   Goal: Pt to meet >/= 90% of their estimated nutrition needs.  Monitor:  PO intake  Assessment:  Pt seen for MST. Pt reports that he has been monitoring his diet/making healthier choices. No nutrition intervention needed at this time.  30 y.o. male  Height: Ht Readings from Last 1 Encounters:  09/11/15 5\' 7"  (1.702 m)    Weight: Wt Readings from Last 1 Encounters:  09/11/15 226 lb (102.513 kg)    Weight Hx: Wt Readings from Last 10 Encounters:  09/11/15 226 lb (102.513 kg)  09/10/15 232 lb 14.4 oz (105.643 kg)    BMI:  Body mass index is 35.39 kg/(m^2). Pt meets criteria for obesity based on current BMI.  Estimated Nutritional Needs: Kcal: 25-30 kcal/kg Protein: > 1 gram protein/kg Fluid: 1 ml/kcal  Diet Order: Diet Carb Modified Fluid consistency:: Thin; Room service appropriate?: Yes Pt is also offered choice of unit snacks mid-morning and mid-afternoon.  Pt is eating as desired.   Lab results and medications reviewed.      Trenton GammonJessica Briyan Kleven, RD, LDN Inpatient Clinical Dietitian Pager # [phone] After hours/weekend pager # [phone]

## 2015-09-11 NOTE — Progress Notes (Signed)
Adult Psychoeducational Group Note  Date:  09/11/2015 Time:  11:40 PM  Group Topic/Focus:  Wrap-Up Group:   The focus of this group is to help patients review their daily goal of treatment and discuss progress on daily workbooks.  Participation Level:  Active  Participation Quality:  Drowsy  Affect:  Anxious  Cognitive:  Confused  Insight: Lacking  Engagement in Group:  Poor  Modes of Intervention:  Support  Additional Comments:  Pt went to group and ended up starting a argument with another pt   Metha Kolasa R 09/11/2015, 11:40 PM
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
## 2015-09-11 NOTE — BHH Counselor (Signed)
Psych disposition: Per Donell SievertSpencer Simon, PA-C, Pt meets inpatient criteria.   Per Clint [facility]ori Beck, [facility], [facility] under the care of Dr Dub MikesLugo. [facility] # [phone].  Counselor notified Dr Mora Bellmanni of disposition and [facility].    Cyndie MullAnna Althea Backs, [facility] Therapeutic Triage [facility] Ext [phone]

## 2015-09-11 NOTE — BHH Group Notes (Signed)
BHH LCSW Group Therapy  09/11/2015 1:15 PM  Type of Therapy:  Group Therapy  Participation Level:  Did Not Attend-pt invited. Chose to remain in bed.   Modes of Intervention:  Confrontation, Discussion, Education, Exploration, Problem-solving, Rapport Building, Socialization and Support  Summary of Progress/Problems: Emotion Regulation: This group focused on both positive and negative emotion identification and allowed group members to process ways to identify feelings, regulate negative emotions, and find healthy ways to manage internal/external emotions. Group members were asked to reflect on a time when their reaction to an emotion led to a negative outcome and explored how alternative responses using emotion regulation would have benefited them. Group members were also asked to discuss a time when emotion regulation was utilized when a negative emotion was experienced.    Fisher, Justin Tamer LCSW 09/11/2015, 1:15 PM
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
## 2015-09-11 NOTE — ED Notes (Signed)
[facility] called for transport. 

## 2015-09-11 NOTE — Tx Team (Signed)
Initial Interdisciplinary Treatment Plan   PATIENT STRESSORS: Financial difficulties Loss of housing Substance abuse   PATIENT STRENGTHS: Average or above average intelligence Communication skills General fund of knowledge Motivation for treatment/growth Physical Health Supportive family/friends Work skills   PROBLEM LIST: Problem List/Patient Goals Date to be addressed Date deferred Reason deferred Estimated date of resolution  "I need to find living arrangements." 09/11/15           "I think I need med adjustments." 09/11/15                                          DISCHARGE CRITERIA:  Adequate post-discharge living arrangements Improved stabilization in mood, thinking, and/or behavior Need for constant or close observation no longer present Verbal commitment to aftercare and medication compliance Withdrawal symptoms are absent or subacute and managed without 24-hour nursing intervention  PRELIMINARY DISCHARGE PLAN: Attend 12-step recovery group Outpatient therapy Placement in alternative living arrangements  PATIENT/FAMIILY INVOLVEMENT: This treatment plan has been presented to and reviewed with the patient, Beecher McardleDavid Revelle, and/or family member.  The patient and family have been given the opportunity to ask questions and make suggestions.  Merian CapronFriedman, Belmira Daley [facility] 09/11/2015, 11:26 AM

## 2015-09-11 NOTE — ED Provider Notes (Signed)
30yo male with history of depression/SI presents with concern for SI. Patient has been accepted for inpt treatment.  He denies other medical issues. VS and labs reviewed and WNL.  [facility].   Alvira MondayErin Homero Hyson, MD 09/11/15 [id number]

## 2015-09-11 NOTE — H&P (Signed)
Psychiatric Admission Assessment Adult  Patient Identification: Justin Fisher MRN:  [id number]  Date of Evaluation:  09/11/2015  Chief Complaint:  Alcohol Induced Depression Disorder  Principal Diagnosis: MDD (major depressive disorder), recurrent severe, without psychosis ([place])  Diagnosis:   Patient Active Problem List   Diagnosis Date Noted  . MDD (major depressive disorder), recurrent severe, without psychosis (Justin Fisher) [F33.2] 09/11/2015  . Alcohol use disorder, moderate, dependence ([place]) [F10.20] 09/11/2015   History of Present Illness: Justin Fisher is a 30 year old Caucasian male. Admitted to the [facility] adult unit from the [facility] ED with complaints of worsening depression & suicidal ideations. During this assessment, Justin Fisher was very irritated, agitated & unco-operative. In the beginning of this assessment, he reports, I went to the hospital yesterday because I was feeling suicidal. I was afraid I was going to hurt myself. I don't know what the trigger was. A couple of years ago, I felt like this, not knowing why. I had attempted suicide in the past, I ate rat poison, was not hospitalized. I would say, I'm depressed for a while, but, I don't why. I saw some doctors in [place], [place], [place], but was not given any medications. I drink a lot of alcohol, I mean a lot. Why do I have to answer these questions again. I had already answered these questions while I was at [facility]. Why do I have to repeat the questions? I'm done here". Jordani got up from his bed & walks to to the bathroom angrily.  Objective: [name] is alert, oriented, irritated, agitated & uncooperative. He presents as being very angry & rude during the admission assessment. He says he has already been assessed at the [facility] ED, as a result not willing to go through the questions questions again. He says he has been to see many doctors over the years for his depression, but had no medications prescribed.  However, chart review, showed [name] was on Lithium Carbonate, Hydroxyzine, Effexor XR, Gabapentin. He is started on the Librium detox protocol for alcohol detox, Trazodone 200 mg for insomnia.  Associated Signs/Symptoms:  Depression Symptoms:  "I feel very depressed"  (Hypo) Manic Symptoms:  Refused to participate in this assessment)  Anxiety Symptoms:  Refused to participate in this assessment)  Psychotic Symptoms:  Refused to participate in this assessment)  PTSD Symptoms: Refused to participate in this assessment)  Total Time spent with patient: 45 minutes  Past Psychiatric History: (Refused to participate in this assessment)  Is the patient at risk to self? Yes.    Has the patient been a risk to self in the past 6 months? Yes.    Has the patient been a risk to self within the distant past? (Refused to participate in this assessment)  Is the patient a risk to others? ( Refused to participate in this assessment) Has the patient been a risk to others in the past 6 months? (Refused to participate in this assessment) Has the patient been a risk to others within the distant past? (Refused to participate in this assessment)  Prior Inpatient Therapy: Yes, [place], [place], [place], [place] Prior Outpatient Therapy: (Refused to participate in this assessment)  Alcohol Screening: 1. How often do you have a drink containing alcohol?: 2 to 4 times a month 2. How many drinks containing alcohol do you have on a typical day when you are drinking?: 10 or more 3. How often do you have six or more drinks on one occasion?: Weekly Preliminary Score: 7  4. How often during the last year have you found that you were not able to stop drinking once you had started?: Weekly 5. How often during the last year have you failed to do what was normally expected from you becasue of drinking?: Monthly 6. How often during the last year have you needed a first drink in the morning to get yourself going after a  heavy drinking session?: Weekly 7. How often during the last year have you had a feeling of guilt of remorse after drinking?: Weekly 8. How often during the last year have you been unable to remember what happened the night before because you had been drinking?: Weekly 9. Have you or someone else been injured as a result of your drinking?: No 10. Has a relative or friend or a doctor or another health worker been concerned about your drinking or suggested you cut down?: Yes, but not in the last year Alcohol Use Disorder Identification Test Final Score (AUDIT): 25 Brief Intervention: Yes  Substance Abuse History in the last 12 months:  Yes.    Consequences of Substance Abuse: Medical Consequences:  Liver damage, Possible death by overdose Legal Consequences:  Arrests, jail time, Loss of driving privilege. Family Consequences:  Family discord, divorce and or separation.  Previous Psychotropic Medications: Yes (Lithium Carbonate)  Psychological Evaluations: Yes   Past Medical History:  Past Medical History  Diagnosis Date  . Anxiety    History reviewed. No pertinent past surgical history.  Family History: History reviewed. No pertinent family history.  Family Psychiatric  History: patient refused to participate in this admission assessment.  Tobacco Screening: Patient refused to answer this assessment  Social History:  History  Alcohol Use  . Yes     History  Drug Use No    Additional Social History:  Allergies:  No Known Allergies  Lab Results:  Results for orders placed or performed during the hospital encounter of 09/10/15 (from the past 48 hour(s))  Urine rapid drug screen (hosp performed) (Not at [facility])     Status: None   Collection Time: 09/10/15  3:35 PM  Result Value Ref Range   Opiates NONE DETECTED NONE DETECTED   Cocaine NONE DETECTED NONE DETECTED   Benzodiazepines NONE DETECTED NONE DETECTED   Amphetamines NONE DETECTED NONE DETECTED   Tetrahydrocannabinol  NONE DETECTED NONE DETECTED   Barbiturates NONE DETECTED NONE DETECTED    Comment:        DRUG SCREEN FOR MEDICAL PURPOSES ONLY.  IF CONFIRMATION IS NEEDED FOR ANY PURPOSE, NOTIFY LAB WITHIN 5 DAYS.        LOWEST DETECTABLE LIMITS FOR URINE DRUG SCREEN Drug Class       Cutoff (ng/mL) Amphetamine      1000 Barbiturate      200 Benzodiazepine   578 Tricyclics       469 Opiates          300 Cocaine          300 THC              50   Comprehensive metabolic panel     Status: None   Collection Time: 09/10/15  4:22 PM  Result Value Ref Range   Sodium 140 135 - 145 mmol/L   Potassium 4.0 3.5 - 5.1 mmol/L   Chloride 104 101 - 111 mmol/L   CO2 24 22 - 32 mmol/L   Glucose, Bld 78 65 - 99 mg/dL   BUN 7 6 - 20 mg/dL  Creatinine, Ser 0.84 0.61 - 1.24 mg/dL   Calcium 9.3 8.9 - 10.3 mg/dL   Total Protein 6.7 6.5 - 8.1 g/dL   Albumin 3.8 3.5 - 5.0 g/dL   AST 32 15 - 41 U/L   ALT 37 17 - 63 U/L   Alkaline Phosphatase 73 38 - 126 U/L   Total Bilirubin 0.4 0.3 - 1.2 mg/dL   GFR calc non Af Amer >60 >60 mL/min   GFR calc Af Amer >60 >60 mL/min    Comment: (NOTE) The eGFR has been calculated using the CKD EPI equation. This calculation has not been validated in all clinical situations. eGFR's persistently <60 mL/min signify possible Chronic Kidney Disease.    Anion gap 12 5 - 15  Ethanol (ETOH)     Status: Abnormal   Collection Time: 09/10/15  4:22 PM  Result Value Ref Range   Alcohol, Ethyl (B) 78 (H) <5 mg/dL    Comment:        LOWEST DETECTABLE LIMIT FOR SERUM ALCOHOL IS 5 mg/dL FOR MEDICAL PURPOSES ONLY   Salicylate level     Status: None   Collection Time: 09/10/15  4:22 PM  Result Value Ref Range   Salicylate Lvl <2.7 2.8 - 30.0 mg/dL  Acetaminophen level     Status: Abnormal   Collection Time: 09/10/15  4:22 PM  Result Value Ref Range   Acetaminophen (Tylenol), Serum <10 (L) 10 - 30 ug/mL    Comment:        THERAPEUTIC CONCENTRATIONS VARY SIGNIFICANTLY. A RANGE OF  10-30 ug/mL MAY BE AN EFFECTIVE CONCENTRATION FOR MANY PATIENTS. HOWEVER, SOME ARE BEST TREATED AT CONCENTRATIONS OUTSIDE THIS RANGE. ACETAMINOPHEN CONCENTRATIONS >150 ug/mL AT 4 HOURS AFTER INGESTION AND >50 ug/mL AT 12 HOURS AFTER INGESTION ARE OFTEN ASSOCIATED WITH TOXIC REACTIONS.   CBC     Status: None   Collection Time: 09/10/15  4:22 PM  Result Value Ref Range   WBC 9.4 4.0 - 10.5 K/uL   RBC 5.15 4.22 - 5.81 MIL/uL   Hemoglobin 14.6 13.0 - 17.0 g/dL   HCT 43.3 39.0 - 52.0 %   MCV 84.1 78.0 - 100.0 fL   MCH 28.3 26.0 - 34.0 pg   MCHC 33.7 30.0 - 36.0 g/dL   RDW 14.0 11.5 - 15.5 %   Platelets 317 150 - 400 K/uL   Blood Alcohol level:  Lab Results  Component Value Date   ETH 78* 74/06/8785   Metabolic Disorder Labs:  No results found for: HGBA1C, MPG No results found for: PROLACTIN No results found for: CHOL, TRIG, HDL, CHOLHDL, VLDL, LDLCALC  Current Medications: Current Facility-Administered Medications  Medication Dose Route Frequency Provider Last Rate Last Dose  . acetaminophen (TYLENOL) tablet 650 mg  650 mg Oral Q6H PRN Encarnacion Slates, NP      . alum & mag hydroxide-simeth (MAALOX/MYLANTA) 200-200-20 MG/5ML suspension 30 mL  30 mL Oral Q4H PRN Encarnacion Slates, NP      . chlordiazePOXIDE (LIBRIUM) capsule 25 mg  25 mg Oral Q6H PRN Ursula Alert, MD      . chlordiazePOXIDE (LIBRIUM) capsule 25 mg  25 mg Oral QID Ursula Alert, MD   0 mg at 09/11/15 1214   Followed by  . [START ON 09/12/2015] chlordiazePOXIDE (LIBRIUM) capsule 25 mg  25 mg Oral TID Ursula Alert, MD       Followed by  . [START ON 09/13/2015] chlordiazePOXIDE (LIBRIUM) capsule 25 mg  25 mg Oral BH-qamhs Saramma Eappen,  MD       Followed by  . [START ON 09/14/2015] chlordiazePOXIDE (LIBRIUM) capsule 25 mg  25 mg Oral Daily Saramma Eappen, MD      . gabapentin (NEURONTIN) tablet 800 mg  800 mg Oral TID PC & HS Encarnacion Slates, NP      . hydrOXYzine (ATARAX/VISTARIL) tablet 25 mg  25 mg Oral Q6H PRN  Ursula Alert, MD      . loperamide (IMODIUM) capsule 2-4 mg  2-4 mg Oral PRN Ursula Alert, MD      . magnesium hydroxide (MILK OF MAGNESIA) suspension 30 mL  30 mL Oral Daily PRN Encarnacion Slates, NP      . multivitamin with minerals tablet 1 tablet  1 tablet Oral Daily Saramma Eappen, MD   0 tablet at 09/11/15 1215  . naproxen (NAPROSYN) tablet 500 mg  500 mg Oral BID Encarnacion Slates, NP      . nicotine polacrilex (NICORETTE) gum 2 mg  2 mg Oral PRN Nicholaus Bloom, MD   2 mg at 09/11/15 1214  . ondansetron (ZOFRAN-ODT) disintegrating tablet 4 mg  4 mg Oral Q6H PRN Ursula Alert, MD      . thiamine (B-1) injection 100 mg  100 mg Intramuscular Once Ursula Alert, MD   100 mg at 09/11/15 1216  . [START ON 09/12/2015] thiamine (VITAMIN B-1) tablet 100 mg  100 mg Oral Daily Saramma Eappen, MD      . traZODone (DESYREL) tablet 200 mg  200 mg Oral Q2000 Encarnacion Slates, NP       PTA Medications: Prescriptions prior to admission  Medication Sig Dispense Refill Last Dose  . gabapentin (NEURONTIN) 600 MG tablet Take 1 tablet (600 mg total) by mouth 3 (three) times daily. 21 tablet 0   . hydrOXYzine (VISTARIL) 50 MG capsule Take 50 mg by mouth 4 (four) times daily as needed.     . lithium 600 MG capsule Take 600 mg by mouth 2 (two) times daily.     . naproxen (NAPROSYN) 500 MG tablet Take 1 tablet (500 mg total) by mouth 2 (two) times daily. 30 tablet 0   . traZODone (DESYREL) 100 MG tablet Take 200 mg by mouth every evening.     . venlafaxine XR (EFFEXOR-XR) 150 MG 24 hr capsule Take 150 mg by mouth daily.      Musculoskeletal: Strength & Muscle Tone: within normal limits Gait & Station: normal Patient leans: N/A  Psychiatric Specialty Exam: Physical Exam  Constitutional: He is oriented to person, place, and time. He appears well-developed.  HENT:  Head: Normocephalic.  Eyes: Pupils are equal, round, and reactive to light.  Neck: Normal range of motion.  Cardiovascular: Normal rate.    Respiratory: Effort normal.  GI: Soft.  Genitourinary:  Denies any issues in this area  Musculoskeletal: Normal range of motion.  Neurological: He is alert and oriented to person, place, and time.  Skin: Skin is warm and dry.  Psychiatric: His speech is normal. Thought content normal. His mood appears anxious. His affect is angry and inappropriate. His affect is not blunt and not labile. He is agitated and aggressive. Cognition and memory are normal. He expresses impulsivity and inappropriate judgment. He exhibits a depressed mood.    Review of Systems  Constitutional: Positive for malaise/fatigue.  HENT: Negative.   Eyes: Negative.   Respiratory: Negative.   Cardiovascular: Negative.   Gastrointestinal: Negative.   Genitourinary: Negative.   Musculoskeletal: Negative.   Skin:  Negative.   Neurological: Positive for weakness.  Endo/Heme/Allergies: Negative.   Psychiatric/Behavioral: Positive for depression, suicidal ideas and substance abuse (Alcoholism,). Negative for hallucinations and memory loss. The patient is nervous/anxious and has insomnia.     Blood pressure 114/97, pulse 82, temperature 98.4 F (36.9 C), temperature source Oral, resp. rate 18, height 5' 7"  (1.702 m), weight 102.513 kg (226 lb), SpO2 100 %.Body mass index is 35.39 kg/(m^2).  General Appearance: Guarded, sad  Eye Contact::  Poor  Speech:  Slow, not spontaneous  Volume:  Decreased  Mood:  Anxious, Depressed and Irritable  Affect:  Flat and Restricted  Thought Process:  Coherent  Orientation:  Full (Time, Place, and Person)  Thought Content:  Patient refused to participate in this assessment  Suicidal Thoughts:  Refused to participate in this assessment  Homicidal Thoughts:  Refused to participate in this assessment  Memory:  Immediate;   Good Recent;   Good Remote;   Good  Judgement:  Impaired  Insight:  Lacking  Psychomotor Activity:  Decreaed, withdrawn  Concentration:  Poor  Recall:  [employer]  of Knowledge:Fair  Language: Good  Akathisia:  No  Handed:  Right  AIMS (if indicated):     Assets:  Others:  Refused to participate in this assessment  ADL's:  Intact  Cognition: WNL  Sleep:      Treatment Plan/Recommendations: 1. Admit for crisis management and stabilization, estimated length of stay 3-5 days.  2. Medication management to reduce current symptoms to base line and improve the patient's overall level of functioning:  Librium detox protocols, resume Gabapentin 800 mg for agitation & Trazodone 200 mg for insomnia. 3. Treat health problems as indicated.  4. Develop treatment plan to decrease risk of relapse upon discharge and the need for readmission.  5. Psycho-social education regarding relapse prevention and self care.  6. Health care follow up as needed for medical problems.  7. Review, reconcile, and reinstate any pertinent home medications for other health issues where appropriate. 8. Call for consults with hospitalist for any additional specialty patient care services as needed.  Observation Level/Precautions:  15 minute checks  Laboratory:  Per ED, BAL 78 per toxicology reports  Psychotherapy: Group sessions   Medications: Librium detox protocols, resume Gabapentin 800 mg, Trazodone 200 mg for insomnia.  Consultations:    Discharge Concerns:    Estimated LOS:  Other:     I certify that inpatient services furnished can reasonably be expected to improve the patient's condition.    Encarnacion Slates, NP, [place] 3/8/20173:13 PM

## 2015-09-11 NOTE — ED Notes (Signed)
tts in process  

## 2015-09-12 ENCOUNTER — Encounter (HOSPITAL_COMMUNITY): Payer: Self-pay | Admitting: Psychiatry

## 2015-09-12 LAB — LIPID PANEL
CHOL/HDL RATIO: 6.7 ratio
Cholesterol: 215 mg/dL — ABNORMAL HIGH (ref 0–200)
HDL: 32 mg/dL — AB (ref >40)
LDL CALC: 128 mg/dL — AB (ref 0–99)
TRIGLYCERIDES: 275 mg/dL — AB (ref <150)
VLDL: 55 mg/dL — AB (ref 0–40)

## 2015-09-12 LAB — TSH: TSH: 0.845 u[IU]/mL (ref 0.350–4.500)

## 2015-09-12 MED ORDER — LAMOTRIGINE 100 MG PO TABS
100.0000 mg | ORAL_TABLET | Freq: Every day | ORAL | Status: DC
  Administered 2015-09-12 – 2015-09-17 (×5): 100 mg via ORAL
  Filled 2015-09-12 (×11): qty 1

## 2015-09-12 MED ORDER — SERTRALINE HCL 100 MG PO TABS
200.0000 mg | ORAL_TABLET | Freq: Every day | ORAL | Status: DC
  Administered 2015-09-12 – 2015-09-17 (×5): 200 mg via ORAL
  Filled 2015-09-12 (×9): qty 2
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
## 2015-09-12 NOTE — Progress Notes (Signed)
[facility]  09/12/2015 3:44 PM Justin McardleDavid Goytia  MRN:  [id number] Subjective:  Justin Fisher states he is having a hard time. States he had been staying at a half way house and he was kicked out not for relapsing but for going out to visit a male friend and not letting them know. States he got upset and he drank. [facility] and had been on Zoloft 200 mg and Lamictal. States he has gone off the Zoloft before and he gets suicidal. States he also feels benefit from the Lamictal.  Principal Problem: MDD (major depressive disorder), recurrent severe, without psychosis (HCC) Diagnosis:   Patient Active Problem List   Diagnosis Date Noted  . MDD (major depressive disorder), recurrent severe, without psychosis (HCC) [F33.2] 09/11/2015  . Alcohol use disorder, moderate, dependence (HCC) [F10.20] 09/11/2015   Total Time spent with patient: 30 minutes  Past Psychiatric History: He was diagnosed with ADHD early on and had been on several medications Adderall Ritalin Wellbutrin Neurontin  Past Medical History:  Past Medical History  Diagnosis Date  . Anxiety    History reviewed. No pertinent past surgical history. Family History: History reviewed. No pertinent family history. Family Psychiatric  History: No family history of psychiatric disorders Social History:  History  Alcohol Use  . Yes     History  Drug Use No    Social History   Social History  . Marital Status: Single    Spouse Name: N/A  . Number of Children: N/A  . Years of Education: N/A   Social History Main Topics  . Smoking status: Current Every Day Smoker -- 0.50 packs/day  . Smokeless tobacco: None  . Alcohol Use: Yes  . Drug Use: No  . Sexual Activity: Not Asked   Other Topics Concern  . None   Social History Narrative   Additional Social History:                         Sleep: Fair  Appetite:  Fair  Current Medications: Current Facility-Administered Medications   Medication Dose Route Frequency Provider Last Rate Last Dose  . acetaminophen (TYLENOL) tablet 650 mg  650 mg Oral Q6H PRN Sanjuana KavaAgnes I Nwoko, NP      . alum & mag hydroxide-simeth (MAALOX/MYLANTA) 200-200-20 MG/5ML suspension 30 mL  30 mL Oral Q4H PRN Sanjuana KavaAgnes I Nwoko, NP      . chlordiazePOXIDE (LIBRIUM) capsule 25 mg  25 mg Oral Q6H PRN Jomarie LongsSaramma Eappen, MD      . chlordiazePOXIDE (LIBRIUM) capsule 25 mg  25 mg Oral TID Jomarie LongsSaramma Eappen, MD   25 mg at 09/12/15 1222   Followed by  . [START ON 09/13/2015] chlordiazePOXIDE (LIBRIUM) capsule 25 mg  25 mg Oral BH-qamhs Jomarie LongsSaramma Eappen, MD       Followed by  . [START ON 09/14/2015] chlordiazePOXIDE (LIBRIUM) capsule 25 mg  25 mg Oral Daily Saramma Eappen, MD      . gabapentin (NEURONTIN) tablet 800 mg  800 mg Oral TID PC & HS Sanjuana KavaAgnes I Nwoko, NP   800 mg at 09/12/15 1222  . hydrOXYzine (ATARAX/VISTARIL) tablet 25 mg  25 mg Oral Q6H PRN Jomarie LongsSaramma Eappen, MD      . lamoTRIgine (LAMICTAL) tablet 100 mg  100 mg Oral Daily Rachael FeeIrving A Lachanda Buczek, MD      . loperamide (IMODIUM) capsule 2-4 mg  2-4 mg Oral PRN Jomarie LongsSaramma Eappen, MD      .  magnesium hydroxide (MILK OF MAGNESIA) suspension 30 mL  30 mL Oral Daily PRN Sanjuana Kava, NP      . multivitamin with minerals tablet 1 tablet  1 tablet Oral Daily Jomarie Longs, MD   1 tablet at 09/12/15 0913  . naproxen (NAPROSYN) tablet 500 mg  500 mg Oral BID Sanjuana Kava, NP   500 mg at 09/12/15 0913  . nicotine polacrilex (NICORETTE) gum 2 mg  2 mg Oral PRN Rachael Fee, MD   2 mg at 09/11/15 2125  . ondansetron (ZOFRAN-ODT) disintegrating tablet 4 mg  4 mg Oral Q6H PRN Jomarie Longs, MD      . sertraline (ZOLOFT) tablet 200 mg  200 mg Oral Daily Rachael Fee, MD      . thiamine (B-1) injection 100 mg  100 mg Intramuscular Once Jomarie Longs, MD   100 mg at 09/11/15 1216  . thiamine (VITAMIN B-1) tablet 100 mg  100 mg Oral Daily Jomarie Longs, MD   100 mg at 09/12/15 0913  . traZODone (DESYREL) tablet 200 mg  200 mg Oral Q2000 Sanjuana Kava, NP   200 mg at 09/11/15 2126    Lab Results:  Results for orders placed or performed during the hospital encounter of 09/11/15 (from the past 48 hour(s))  Lipid panel, fasting     Status: Abnormal   Collection Time: 09/12/15  6:35 AM  Result Value Ref Range   Cholesterol 215 (H) 0 - 200 mg/dL   Triglycerides 161 (H) <150 mg/dL   HDL 32 (L) >09 mg/dL   Total CHOL/HDL Ratio 6.7 RATIO   VLDL 55 (H) 0 - 40 mg/dL   LDL Cholesterol 604 (H) 0 - 99 mg/dL    Comment:        Total Cholesterol/HDL:CHD Risk Coronary Heart Disease Risk Table                     Men   Women  1/2 Average Risk   3.4   3.3  Average Risk       5.0   4.4  2 X Average Risk   9.6   7.1  3 X Average Risk  23.4   11.0        Use the calculated Patient Ratio above and the CHD Risk Table to determine the patient's CHD Risk.        ATP III CLASSIFICATION (LDL):  <100     mg/dL   Optimal  540-981  mg/dL   Near or Above                    Optimal  130-159  mg/dL   Borderline  191-478  mg/dL   High  >295     mg/dL   Very High Performed at [facility]   TSH     Status: None   Collection Time: 09/12/15  6:35 AM  Result Value Ref Range   TSH 0.845 0.350 - 4.500 uIU/mL    Comment: Performed at [facility]    Blood Alcohol level:  Lab Results  Component Value Date   [facility] 09/10/2015    Physical Findings: AIMS: Facial and Oral Movements Muscles of Facial Expression: None, normal Lips and Perioral Area: None, normal Jaw: None, normal Tongue: None, normal,Extremity Movements Upper (arms, wrists, hands, fingers): None, normal Lower (legs, knees, ankles, toes): None, normal, Trunk Movements Neck, shoulders, hips: None, normal, Overall Severity Severity of  abnormal movements (highest score from questions above): None, normal Incapacitation due to abnormal movements: None, normal Patient's awareness of abnormal movements (rate only patient's report): No Awareness, Dental  Status Current problems with teeth and/or dentures?: No Does patient usually wear dentures?: No  CIWA:  CIWA-Ar Total: 0 COWS:     Musculoskeletal: Strength & Muscle Tone: within normal limits Gait & Station: normal Patient leans: normal  Psychiatric Specialty Exam: Review of Systems  Constitutional: Positive for malaise/fatigue.  HENT: Negative.   Eyes: Negative.   Respiratory: Negative.   Cardiovascular: Negative.   Gastrointestinal: Negative.   Genitourinary: Negative.   Musculoskeletal: Negative.   Skin: Negative.   Neurological: Negative.   Endo/Heme/Allergies: Negative.   Psychiatric/Behavioral: Positive for depression and substance abuse. The patient is nervous/anxious.     Blood pressure 100/54, pulse 50, temperature 97.4 F (36.3 C), temperature source Oral, resp. rate 18, height  (1.702 m), weight 102.513 kg (226 lb), SpO2 99 %.Body mass index is 35.39 kg/(m^2).  General Appearance: Fairly Groomed  Patent attorney::  Fair  Speech:  Clear and Coherent  Volume:  Normal  Mood:  Anxious and Depressed  Affect:  anxious worried  Thought Process:  Coherent and Goal Directed  Orientation:  Full (Time, Place, and Person)  Thought Content:  symptoms events worries concerns  Suicidal Thoughts:  No  Homicidal Thoughts:  No  Memory:  Immediate;   Fair Recent;   Fair Remote;   Fair  Judgement:  Fair  Insight:  Present  Psychomotor Activity:  Normal  Concentration:  Fair  Recall:  [name] of Knowledge:Fair  Language: Fair  Akathisia:  No  Handed:  Right  AIMS (if indicated):     Assets:  Desire for Improvement  ADL's:  Intact  Cognition: WNL  Sleep:  Number of Hours: 6.75   Treatment Plan Summary: Daily contact with patient to assess and evaluate symptoms and progress in treatment and Medication management Supportive approach/coping skills Alcohol dependence; Librium detox protocol/work a relapse prevention plan Depression; resume the Zoloft and optimize  response Mood instability; resume the Lamictal 100 mg daily Work with CBT/mindfulness Arrange for outpatient follow up Rachael Fee, MD 09/12/2015, 3:44 PM

## 2015-09-12 NOTE — [facility] Counselor (Signed)
Pt not willing to complete PSA this morning. Pt sleeping in room this afternoon. Will try again on Friday.  Trula SladeHeather Smart, MSW, LCSW Clinical Social Worker 09/12/2015 2:47 PM
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
## 2015-09-12 NOTE — Progress Notes (Signed)
[facility] Group Notes:  (Nursing/MHT/Case Management/Adjunct)  Date:  09/12/2015  Time:  2100 Type of Therapy:  wrap up group  Participation Level:  Active  Participation Quality:  Appropriate, Attentive, Sharing and Supportive  Affect:  Appropriate and Irritable  Cognitive:  Appropriate  Insight:  Appropriate  Engagement in Group:  Engaged  Modes of Intervention:  Clarification, Education and Support  Summary of Progress/Problems:  Justin Fisher, Justin Fisher 09/12/2015, 10:21 PM

## 2015-09-12 NOTE — [facility] Suicide Risk Assessment (Signed)
[facility] INPATIENT:  Family/Significant Other Suicide Prevention Education  Suicide Prevention Education:  Patient Refusal for Family/Significant Other Suicide Prevention Education: The patient Justin McardleDavid Fisher has refused to provide written consent for family/significant other to be provided Family/Significant Other Suicide Prevention Education during admission and/or prior to discharge.  Physician notified.  SPE completed with pt, as pt refused to consent to family contact. SPI pamphlet provided to pt and pt was encouraged to share information with support network, ask questions, and talk about any concerns relating to SPE. Pt denies access to guns/firearms and verbalized understanding of information provided. Mobile Crisis information also provided to pt.   Smart, Sten Dematteo LCSW 09/12/2015, 3:16 PM

## 2015-09-12 NOTE — Progress Notes (Signed)
D:Patient in the dayroom on approach.  Patient states, "My day was boring."  Patient states we don't have groups around here."  Patient states he is experiencing anxiety and depression.  Patient states he has periods of agitation, anxiety and depression.  Patient states he has passive SI but verbally contracts for safety.  Patient denies withdrawal symptoms. A: Staff to monitor Q 15 mins for safety.  Encouragement and support offered.  Scheduled medications administered per orders.   R: Patient remains safe on the unit.  Patient attended group tonight.  Patient visible on the unit and interacting with peers.  Patient taking administered medications.

## 2015-09-13 LAB — HEMOGLOBIN A1C
HEMOGLOBIN A1C: 6.5 % — AB (ref 4.8–5.6)
Mean Plasma Glucose: 140 mg/dL

## 2015-09-13 NOTE — [facility] Counselor (Signed)
Adult Comprehensive Assessment  Patient ID: Justin Fisher, male   DOB: 08-07-1985, 30 y.o.   MRN: [id number]  Information Source: Information source: Patient  Current Stressors:  Educational / Learning stressors: none identified Employment / Job issues: employed as Holiday representativeconstruction person at [employer]" Family Relationships: poor-no identified family support at this time Surveyor, quantityinancial / Lack of resources (include bankruptcy): income from employment/BCBS through "[employer]"  Housing / Lack of housing: homeless and living in his car Physical health (include injuries & life threatening diseases): none identified Social relationships: poor-no friends in community that are supportive and in recovery.  Substance abuse: pt reports that contradictory to admission note, he did not relapse "I said that so I could get admitted." Pt reports being sober from alcohol since Dec 2016.  Bereavement / Loss: loss of family relationships due to poor choices and alcoholism  Living/Environment/Situation:  Living Arrangements: Alone Living conditions (as described by patient or guardian): homeless in car How long has patient lived in current situation?: few days-recently kicked out of [facility] [facility] after going to visit a male.  What is atmosphere in current home: Chaotic, Temporary  Family History:  Marital status: Single Are you sexually active?: No What is your sexual orientation?: heterosexual Has your sexual activity been affected by drugs, alcohol, medication, or emotional stress?: n/a  Does patient have children?: No  Childhood History:  By whom was/is the patient raised?: Both parents Additional childhood history information: Pt did not wish to discuss family in detail. No mental illness or substance abuse issues in family Description of patient's relationship with caregiver when they were a child: close to parents Patient's description of current relationship with people who raised him/her: strained  from parents-"burned bridges" How were you disciplined when you got in trouble as a child/adolescent?: pt did not answer Does patient have siblings?: Yes Number of Siblings: 1 Description of patient's current relationship with siblings: pt did not wish to elaborate  Did patient suffer any verbal/emotional/physical/sexual abuse as a child?: No Did patient suffer from severe childhood neglect?: No Has patient ever been sexually abused/assaulted/raped as an adolescent or adult?: No Was the patient ever a victim of a crime or a disaster?: No Witnessed domestic violence?: No Has patient been effected by domestic violence as an adult?: No  Education:  Highest grade of school patient has completed: high school  Currently a student?: No Name of school: n/a  Learning disability?: No  Employment/Work Situation:   Employment situation: Employed 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?: No Are There Guns or Other Weapons in Your Home?: No Are These Weapons Safely Secured?: No  Financial Resources:   Financial resources: Income from employment, Private insurance Does patient have a representative payee or guardian?: No  Alcohol/Substance Abuse:   What has been your use of drugs/alcohol within the last 12 months?: Pt reports that he did not relapse on alcohol as he said during initial assessment. Pt reports being sober since Dec 2016.  If attempted suicide, did drugs/alcohol play a role in this?: Yes (past attempts. most recently, pt reports chronic SI with plan) Alcohol/Substance Abuse  Treatment Hx: Past Tx,  Inpatient If yes, describe treatment: [facility] Dix 25 + times; [facility] per pt although not pulling up in his chart. Pt reports he used to follow up at [facility] and got out of treatment in january 2017 but is hoping to find a new psychiatrist.  Has alcohol/substance abuse ever caused legal problems?: No  Social Support System:   Forensic psychologist System: Poor Describe Community Support System: pt cannot identify any community supports Type of faith/religion: n/a  How does patient's faith help to cope with current illness?: n/a   Leisure/Recreation:   Leisure and Hobbies: pt refused to answer  Strengths/Needs:   What things does the patient do well?: pt refused to answer In what areas does patient struggle / problems for patient: coping with depression/SI. irritability/emotional regulation; coping skills  Discharge Plan:   Does patient have access to transportation?: Yes (car and license) Will patient be returning to same living situation after discharge?: No Plan for living situation after discharge: pt is hoping to get into housing of some sort Currently receiving community mental health services: Yes (From Whom) ([facility]) If no, would patient like referral for services when discharged?: Yes (What county?) ([place]-possibly [facility] or Med management at [facility]. Pt does not want [place]. "Maybe a counselor and psychiatrist to see once a month or so." ) Does patient have financial barriers related to discharge medications?: No (income from employment and insurance)  Summary/Recommendations:   Emergency planning/management officer and Recommendations (to be completed by the evaluator): Patient is 30 year old male living in [place], [place] ([place] He reports that he was recently kicked out of an [facility] and is homeless. Patient is employed with [employer]. Patient reports that he was experiencing increased depression and suicidal ideations with a plan prior to  admission but had not relapsed on alcohol and has been sober since Dec 2016. Recommendations for patient include: crisis stabilization, therapeutic milieu, encourage group attendance and participation, medication management for mood stabilization, and development of comprehensive mental wellness plan.   Justin Fisher, Tomasa Dobransky LCSW 09/13/2015 3:24 PM

## 2015-09-13 NOTE — [facility] Group Notes (Signed)
[facility] LCSW Group Therapy  09/13/2015 1:12 PM  Type of Therapy:  Group Therapy  Participation Level:  Active  Participation Quality:  Attentive  Affect:  Appropriate  Cognitive:  Alert  Insight:  Improving  Engagement in Therapy:  Improving  Modes of Intervention:  Confrontation, Discussion, Education, Exploration, Problem-solving, Rapport Building, Socialization and Support  Summary of Progress/Problems: Feelings around Relapse. Group members discussed the meaning of relapse and shared personal stories of relapse, how it affected them and others, and how they perceived themselves during this time. Group members were encouraged to identify triggers, warning signs and coping skills used when facing the possibility of relapse. Social supports were discussed and explored in detail. Post Acute Withdrawal Syndrome (handout provided) was introduced and examined. Pt's were encouraged to ask questions, talk about key points associated with PAWS, and process this information in terms of relapse prevention. Justin Fisher shared that he did not relapse on alcohol or illegal substances prior to admission but lied in order to be admitted. "I was really struggling with suicidal thoughts and depression." Justin Fisher talked about his mental health relapse in depth and was able to identify things that would help him feel less depressed "I need stable housing and more support since my family is not there for me anymore."   Smart, Justin Bontempo LCSW 09/13/2015, 1:12 PM

## 2015-09-13 NOTE — Progress Notes (Signed)
D- Patient was difficult to wake this morning.  Patient was prompted to take morning medications several times but would not get OOB.  Patient attended groups and but did not interact fully.   Patient denies HI and AVH but reports passive suicidal thoughts.    A- Assess patient for safety offer medications as prescribed engage patient in 1:1 therapeutic staff talks,   R-  Patient is able to contract for safety

## 2015-09-13 NOTE — Progress Notes (Signed)
Recreation Therapy Notes  Date: 03.10.2017 Time: 9:30am Location: 300 Hall Group Room   Group Topic: Stress Management  Goal Area(s) Addresses:  Patient will actively participate in stress management techniques presented during session.   Behavioral Response: Did not attend.    Dustin Burrill L Aakash Hollomon, LRT/CTRS       Khara Renaud L 09/13/2015 10:20 AM 

## 2015-09-13 NOTE — [facility] Group Notes (Signed)
[facility]   09/13/2015 9:20 AM  Participation Quality:  Invited. DID NOT ATTEND. Pt chose to remain in bed.   Smart, Kino Dunsworth LCSW

## 2015-09-13 NOTE — [facility] Counselor (Signed)
Pt refusing to get out of bed this morning to meet with CSW. (for psychosocial assessment and to discuss aftercare plan).   Trula SladeHeather Smart, MSW, LCSW Clinical Social Worker 09/13/2015 9:34 AM

## 2015-09-13 NOTE — Progress Notes (Signed)
[facility] MD Progress Note  09/13/2015 7:34 PM Justin Fisher  MRN:  [id number] Subjective:  Justin Fisher continues to work on getting his life back together. He states that when he ran out of medications things got worst. States he could not cope with having have asked him to leave the half way house. Feels that on medications he could have handled things better. Still having a hard time sleeping at night being tired fatigue during the day Principal Problem: MDD (major depressive disorder), recurrent severe, without psychosis (HCC) Diagnosis:   Patient Active Problem List   Diagnosis Date Noted  . MDD (major depressive disorder), recurrent severe, without psychosis (HCC) [F33.2] 09/11/2015  . Alcohol use disorder, moderate, dependence (HCC) [F10.20] 09/11/2015   Total Time spent with patient: 20 minutes  Past Psychiatric History: see admission H and P  Past Medical History:  Past Medical History  Diagnosis Date  . Anxiety    History reviewed. No pertinent past surgical history. Family History: History reviewed. No pertinent family history. Family Psychiatric  History: see admission H and P Social History:  History  Alcohol Use  . Yes     History  Drug Use No    Social History   Social History  . Marital Status: Single    Spouse Name: N/A  . Number of Children: N/A  . Years of Education: N/A   Social History Main Topics  . Smoking status: Current Every Day Smoker -- 0.50 packs/day  . Smokeless tobacco: None  . Alcohol Use: Yes  . Drug Use: No  . Sexual Activity: Not Asked   Other Topics Concern  . None   Social History Narrative   Additional Social History:                         Sleep: Poor  Appetite:  Fair  Current Medications: Current Facility-Administered Medications  Medication Dose Route Frequency Provider Last Rate Last Dose  . acetaminophen (TYLENOL) tablet 650 mg  650 mg Oral Q6H PRN Sanjuana Kava, NP      . alum & mag hydroxide-simeth  (MAALOX/MYLANTA) 200-200-20 MG/5ML suspension 30 mL  30 mL Oral Q4H PRN Sanjuana Kava, NP      . chlordiazePOXIDE (LIBRIUM) capsule 25 mg  25 mg Oral Q6H PRN Jomarie Longs, MD      . chlordiazePOXIDE (LIBRIUM) capsule 25 mg  25 mg Oral BH-qamhs Jomarie Longs, MD   25 mg at 09/13/15 0948   Followed by  . [START ON 09/14/2015] chlordiazePOXIDE (LIBRIUM) capsule 25 mg  25 mg Oral Daily Saramma Eappen, MD      . gabapentin (NEURONTIN) tablet 800 mg  800 mg Oral TID PC & HS Sanjuana Kava, NP   800 mg at 09/13/15 1822  . hydrOXYzine (ATARAX/VISTARIL) tablet 25 mg  25 mg Oral Q6H PRN Jomarie Longs, MD   25 mg at 09/13/15 1320  . lamoTRIgine (LAMICTAL) tablet 100 mg  100 mg Oral Daily Rachael Fee, MD   100 mg at 09/12/15 1546  . loperamide (IMODIUM) capsule 2-4 mg  2-4 mg Oral PRN Jomarie Longs, MD      . magnesium hydroxide (MILK OF MAGNESIA) suspension 30 mL  30 mL Oral Daily PRN Sanjuana Kava, NP      . multivitamin with minerals tablet 1 tablet  1 tablet Oral Daily Jomarie Longs, MD   1 tablet at 09/12/15 0913  . naproxen (NAPROSYN) tablet 500 mg  500 mg  Oral BID Sanjuana KavaAgnes I Nwoko, NP   500 mg at 09/13/15 1821  . nicotine polacrilex (NICORETTE) gum 2 mg  2 mg Oral PRN Rachael FeeIrving A Jerritt Cardoza, MD   2 mg at 09/11/15 2125  . ondansetron (ZOFRAN-ODT) disintegrating tablet 4 mg  4 mg Oral Q6H PRN Jomarie LongsSaramma Eappen, MD      . sertraline (ZOLOFT) tablet 200 mg  200 mg Oral Daily Rachael FeeIrving A Brynlee Pennywell, MD   200 mg at 09/12/15 1546  . thiamine (B-1) injection 100 mg  100 mg Intramuscular Once Jomarie LongsSaramma Eappen, MD   100 mg at 09/11/15 1216  . thiamine (VITAMIN B-1) tablet 100 mg  100 mg Oral Daily Jomarie LongsSaramma Eappen, MD   100 mg at 09/12/15 0913  . traZODone (DESYREL) tablet 200 mg  200 mg Oral Q2000 Sanjuana KavaAgnes I Nwoko, NP   200 mg at 09/12/15 2207    Lab Results:  Results for orders placed or performed during the hospital encounter of 09/11/15 (from the past 48 hour(s))  Lipid panel, fasting     Status: Abnormal   Collection Time:  09/12/15  6:35 AM  Result Value Ref Range   Cholesterol 215 (H) 0 - 200 mg/dL   Triglycerides 960275 (H) <150 mg/dL   HDL 32 (L) >45>40 mg/dL   Total CHOL/HDL Ratio 6.7 RATIO   VLDL 55 (H) 0 - 40 mg/dL   LDL Cholesterol 409128 (H) 0 - 99 mg/dL    Comment:        Total Cholesterol/HDL:CHD Risk Coronary Heart Disease Risk Table                     Men   Women  1/2 Average Risk   3.4   3.3  Average Risk       5.0   4.4  2 X Average Risk   9.6   7.1  3 X Average Risk  23.4   11.0        Use the calculated Patient Ratio above and the CHD Risk Table to determine the patient's CHD Risk.        ATP III CLASSIFICATION (LDL):  <100     mg/dL   Optimal  [id number]  mg/dL   Near or Above                    Optimal  130-159  mg/dL   Borderline  [id number]  mg/dL   High  >213>190     mg/dL   Very High [facility]   Hemoglobin A1c     Status: Abnormal   Collection Time: 09/12/15  6:35 AM  Result Value Ref Range   Hgb A1c MFr Bld 6.5 (H) 4.8 - 5.6 %    Comment: (NOTE)         Pre-diabetes: 5.7 - 6.4         Diabetes: >6.4         Glycemic control for adults with diabetes: <7.0    Mean Plasma Glucose 140 mg/dL    Comment: (NOTE) Performed At: [facility] [address] [place], [place] [id number] Mila HomerHancock William F MD GE:[phone] Performed at [facility]   TSH     Status: None   Collection Time: 09/12/15  6:35 AM  Result Value Ref Range   TSH 0.845 0.350 - 4.500 uIU/mL    Comment: Performed at [facility]    Blood Alcohol level:  Lab Results  Component Value  Date   ETH 56* 09/10/2015    Physical Findings: AIMS: Facial and Oral Movements Muscles of Facial Expression: None, normal Lips and Perioral Area: None, normal Jaw: None, normal Tongue: None, normal,Extremity Movements Upper (arms, wrists, hands, fingers): None, normal Lower (legs, knees, ankles, toes): None, normal, Trunk Movements Neck, shoulders, hips: None,  normal, Overall Severity Severity of abnormal movements (highest score from questions above): None, normal Incapacitation due to abnormal movements: None, normal Patient's awareness of abnormal movements (rate only patient's report): No Awareness, Dental Status Current problems with teeth and/or dentures?: No Does patient usually wear dentures?: No  CIWA:  CIWA-Ar Total: 0 COWS:     Musculoskeletal: Strength & Muscle Tone: within normal limits Gait & Station: normal Patient leans: normal  Psychiatric Specialty Exam: Review of Systems  Constitutional: Negative.   HENT: Negative.   Eyes: Negative.   Respiratory: Negative.   Cardiovascular: Negative.   Gastrointestinal: Negative.   Genitourinary: Negative.   Musculoskeletal: Negative.   Skin: Negative.   Neurological: Negative.   Endo/Heme/Allergies: Negative.   Psychiatric/Behavioral: Positive for depression and substance abuse. The patient is nervous/anxious.     Blood pressure 129/56, pulse 66, temperature 97.9 F (36.6 C), temperature source Oral, resp. rate 18, height  (1.702 m), weight 102.513 kg (226 lb), SpO2 99 %.Body mass index is 35.39 kg/(m^2).  General Appearance: Fairly Groomed  Patent attorney::  Fair  Speech:  Clear and Coherent  Volume:  fluctuates  Mood:  Anxious, Depressed and worried  Affect:  Restricted  Thought Process:  Coherent and Goal Directed  Orientation:  Full (Time, Place, and Person)  Thought Content:  symptoms events worries concerns  Suicidal Thoughts:  No  Homicidal Thoughts:  No  Memory:  Immediate;   Fair Recent;   Fair Remote;   Fair  Judgement:  Fair  Insight:  Present and Shallow  Psychomotor Activity:  Restlessness  Concentration:  Fair  Recall:  [name] of Knowledge:Fair  Language: Fair  Akathisia:  No  Handed:  Right  AIMS (if indicated):     Assets:  Desire for Improvement  ADL's:  Intact  Cognition: WNL  Sleep:  Number of Hours: 5.25   Treatment Plan  Summary: Daily contact with patient to assess and evaluate symptoms and progress in treatment and Medication management Supportive approach/coping skills Alcohol dependence; continue the Librium detox protocol/work a relapse prevention plan Depression; continue the Zoloft 200 mg daily Mood instability; continue the Lamictal 100 mg daily Insomnia; will continue the Trazodone 200 mg and reassess effectiveness Work with CBT/mindfulness Explore outpatient treatment options Dalesha Stanback A, MD 09/13/2015, 7:34 PM

## 2015-09-14 MED ORDER — HYDROXYZINE HCL 25 MG PO TABS
25.0000 mg | ORAL_TABLET | Freq: Four times a day (QID) | ORAL | Status: DC | PRN
  Administered 2015-09-14 – 2015-09-16 (×4): 25 mg via ORAL
  Filled 2015-09-14 (×3): qty 1

## 2015-09-14 MED ORDER — HYDROXYZINE HCL 50 MG PO TABS
50.0000 mg | ORAL_TABLET | Freq: Every evening | ORAL | Status: DC | PRN
  Administered 2015-09-14 – 2015-09-15 (×2): 50 mg via ORAL
  Filled 2015-09-14 (×10): qty 1

## 2015-09-14 NOTE — Progress Notes (Signed)
D.  Pt in dayroom interacting appropriately with peers, no complaints voiced at this time.  Pt states he does not take his Trazodone at 2000 as is scheduled, he states he takes it at 2130.  Pt requested to wait until that time to take it and to see if the time could be changed for tomorrow.  Pt denies SI/HI/hallucinations at this time.  A.  Support and encouragement offered, note made for doctor about medication times.  R.  Pt remains safe on unit, will continue to monitor.

## 2015-09-14 NOTE — [facility] Group Notes (Signed)
[facility] Group Notes: (Clinical Social Work)   09/14/2015      Type of Therapy:  Group Therapy   Participation Level:  Did Not Attend despite MHT prompting   Jossilyn Benda Grossman-Orr, LCSW 09/14/2015, 12:44 PM     

## 2015-09-14 NOTE — Progress Notes (Signed)
D: Patient observed in milieu with peers. Patient with minimal conversation. Patient states his goal for the day was " to find housing after discharge." A: Patient communicating to social worker who is working with patient to find housing.  Patient excited about this. Q 15 minute checks in progress and maintained for safety. R: Patient remains safe on unit.

## 2015-09-14 NOTE — Progress Notes (Signed)
[facility] MD Progress Note  09/14/2015 1:38 PM Justin Fisher  MRN:  [id number] Subjective:  Patient reports " I am fine"  Objective:  Justin Fisher is awake, alert and oriented X4 , found resting in bedroom with his t-shirt covering his eyes. Patient denies headache or sensitivity to light. Patient is disengaged throughout this discussion.  Denies suicidal or homicidal ideation. Denies auditory or visual hallucination. Patient responds are "yes and no" without further elaborations.  Patient reports he is medication compliant without medication side effects. States his depression 5/10. Reports eating and sleeping well.  Per staff notes patient is not resting well throughout the night. Support, encouragement and reassurance was provided.   Principal Problem: MDD (major depressive disorder), recurrent severe, without psychosis (HCC) Diagnosis:   Patient Active Problem List   Diagnosis Date Noted  . MDD (major depressive disorder), recurrent severe, without psychosis (HCC) [F33.2] 09/11/2015  . Alcohol use disorder, moderate, dependence (HCC) [F10.20] 09/11/2015   Total Time spent with patient: 20 minutes  Past Psychiatric History: see admission H and P  Past Medical History:  Past Medical History  Diagnosis Date  . Anxiety    History reviewed. No pertinent past surgical history. Family History: History reviewed. No pertinent family history. Family Psychiatric  History: see admission H and P Social History:  History  Alcohol Use  . Yes     History  Drug Use No    Social History   Social History  . Marital Status: Single    Spouse Name: N/A  . Number of Children: N/A  . Years of Education: N/A   Social History Main Topics  . Smoking status: Current Every Day Smoker -- 0.50 packs/day  . Smokeless tobacco: None  . Alcohol Use: Yes  . Drug Use: No  . Sexual Activity: Not Asked   Other Topics Concern  . None   Social History Narrative   Additional Social History:                          Sleep: Poor  Appetite:  Fair  Current Medications: Current Facility-Administered Medications  Medication Dose Route Frequency Provider Last Rate Last Dose  . acetaminophen (TYLENOL) tablet 650 mg  650 mg Oral Q6H PRN Sanjuana Kava, NP      . alum & mag hydroxide-simeth (MAALOX/MYLANTA) 200-200-20 MG/5ML suspension 30 mL  30 mL Oral Q4H PRN Sanjuana Kava, NP      . gabapentin (NEURONTIN) tablet 800 mg  800 mg Oral TID PC & HS Sanjuana Kava, NP   800 mg at 09/14/15 0800  . lamoTRIgine (LAMICTAL) tablet 100 mg  100 mg Oral Daily Rachael Fee, MD   100 mg at 09/14/15 0800  . magnesium hydroxide (MILK OF MAGNESIA) suspension 30 mL  30 mL Oral Daily PRN Sanjuana Kava, NP      . multivitamin with minerals tablet 1 tablet  1 tablet Oral Daily Jomarie Longs, MD   1 tablet at 09/14/15 0759  . naproxen (NAPROSYN) tablet 500 mg  500 mg Oral BID Sanjuana Kava, NP   500 mg at 09/14/15 0759  . nicotine polacrilex (NICORETTE) gum 2 mg  2 mg Oral PRN Rachael Fee, MD   2 mg at 09/13/15 2008  . sertraline (ZOLOFT) tablet 200 mg  200 mg Oral Daily Rachael Fee, MD   200 mg at 09/14/15 0758  . thiamine (B-1) injection 100 mg  100 mg Intramuscular  Once Jomarie Longs, MD   100 mg at 09/11/15 1216  . thiamine (VITAMIN B-1) tablet 100 mg  100 mg Oral Daily Jomarie Longs, MD   100 mg at 09/14/15 0758  . traZODone (DESYREL) tablet 200 mg  200 mg Oral Q2000 Sanjuana Kava, NP   200 mg at 09/13/15 2216    Lab Results:  No results found for this or any previous visit (from the past 48 hour(s)).  Blood Alcohol level:  Lab Results  Component Value Date   ETH 78* 09/10/2015    Physical Findings: AIMS: Facial and Oral Movements Muscles of Facial Expression: None, normal Lips and Perioral Area: None, normal Jaw: None, normal Tongue: None, normal,Extremity Movements Upper (arms, wrists, hands, fingers): None, normal Lower (legs, knees, ankles, toes): None, normal, Trunk  Movements Neck, shoulders, hips: None, normal, Overall Severity Severity of abnormal movements (highest score from questions above): None, normal Incapacitation due to abnormal movements: None, normal Patient's awareness of abnormal movements (rate only patient's report): No Awareness, Dental Status Current problems with teeth and/or dentures?: No Does patient usually wear dentures?: No  CIWA:  CIWA-Ar Total: 0 COWS:     Musculoskeletal: Strength & Muscle Tone: within normal limits Gait & Station: normal Patient leans: normal  Psychiatric Specialty Exam: Review of Systems  Constitutional: Negative.   HENT: Negative.   Eyes: Negative.   Respiratory: Negative.   Cardiovascular: Negative.   Gastrointestinal: Negative.   Genitourinary: Negative.   Musculoskeletal: Negative.   Skin: Negative.   Neurological: Negative.   Endo/Heme/Allergies: Negative.   Psychiatric/Behavioral: Positive for depression and substance abuse. The patient is nervous/anxious.   All other systems reviewed and are negative.   Blood pressure 115/69, pulse 86, temperature 98.4 F (36.9 C), temperature source Oral, resp. rate 18, height  (1.702 m), weight 102.513 kg (226 lb), SpO2 99 %.Body mass index is 35.39 kg/(m^2).  General Appearance: Casual  Eye Contact::  None patient eyes covered.    Speech:  Clear and Coherent  Volume:  fluctuates  Mood:  Anxious, Depressed and worried  Affect:  Restricted  Thought Process:  Coherent and Goal Directed  Orientation:  Full (Time, Place, and Person)  Thought Content:  symptoms events worries concerns  Suicidal Thoughts:  No  Homicidal Thoughts:  No  Memory:  Immediate;   Fair Recent;   Fair Remote;   Fair  Judgement:  Fair  Insight:  Present and Shallow  Psychomotor Activity:  Restlessness  Concentration:  Fair  Recall:  [name] of Knowledge:Fair  Language: Fair  Akathisia:  No  Handed:  Right  AIMS (if indicated):     Assets:  Desire for  Improvement  ADL's:  Intact  Cognition: WNL  Sleep:  Number of Hours: 5.25   I agree with current treatment plan on 09/14/2015, Patient seen face-to-face for psychiatric evaluation follow-up, chart reviewed and case discussed with the MD Justin Fisher.  Reviewed the information documented and agree with the treatment plan.  Treatment Plan Summary: Daily contact with patient to assess and evaluate symptoms and progress in treatment and Medication management Supportive approach/coping skills Alcohol dependence; continue the Librium detox protocol/work a relapse prevention plan Depression; continue the Zoloft 200 mg daily Mood instability; continue the Lamictal 100 mg daily Insomnia; will continue the Trazodone 200 mg and reassess effectiveness. Start vistaril 50 mg with 1x repeat dose. Work with CBT/mindfulness Explore outpatient treatment options  Oneta Rack, NP 09/14/2015, 1:38 PM  I reviewed chart and agreed with  the findings and treatment Plan.  Kathryne SharperSyed Beena Catano, MD

## 2015-09-14 NOTE — Progress Notes (Signed)
D-  Patient has been less irritable this shift.  Patient had no agitated outburst and was compliant with medication.  Patient attended group and participated offering great insight.  Patient denies HI and AVH but remains passively suicidal.   A- Assess for safety, offer medications as prescribed, engage in 1:1 therapeutic staff talks.   R- Patient able to contract for safety.

## 2015-09-15 MED ORDER — TRAZODONE HCL 100 MG PO TABS
200.0000 mg | ORAL_TABLET | Freq: Every day | ORAL | Status: DC
  Administered 2015-09-15 – 2015-09-16 (×2): 200 mg via ORAL
  Filled 2015-09-15 (×3): qty 2

## 2015-09-15 NOTE — [facility] Group Notes (Signed)
[facility] Group Notes: (Clinical Social Work)   09/15/2015      Type of Therapy:  Group Therapy   Participation Level:  Did Not Attend despite MHT prompting   Laxmi Choung Grossman-Orr, LCSW 09/15/2015, 12:25 PM     

## 2015-09-15 NOTE — [facility] Group Notes (Signed)
[facility] Group Notes:  (Nursing/MHT/Case Management/Adjunct)  Date:  09/15/2015  Time:  10:29 AM  Type of Therapy:  Psychoeducational Skills  Participation Level:  Did Not Attend  Participation Quality:  N/A  Affect:  N/A  Cognitive:  N/A  Insight:  None  Engagement in Group:  None  Modes of Intervention:  Discussion and Education  Summary of Progress/Problems: Patient was invited but did not attend.   Analyn Matusek E 09/15/2015, 10:29 AM 

## 2015-09-15 NOTE — Progress Notes (Signed)
Nursing Shift Note:  D. Patient is polite, cooperative, compliant with medications, does admit to passing SI w/o a plan but no HI or AVH. Patient also admits to having feelings of worthlessness, hoplelssness, helplessness, and much worry and anxiety over his future as he is telling this Nurse that "now that i don't even have a place to live anymore". A. Nurse providing emotional support, therapeutic communication, medications as ordered, and ensuring continuous q 15 minute checks for safety are done and documented. R Patient contracts for safety on the unit and remains safe on the unit

## 2015-09-15 NOTE — Progress Notes (Signed)
Patient did attend the evening speaker AA meeting.  

## 2015-09-15 NOTE — Progress Notes (Signed)
D.  Pt pleasant on approach, no complaints voiced other than some anxiety.  Positive for evening AA group, interacting appropriately with peers on the unit.  Denies SI at this time but did endorse passive SI early today.  Denies HI/hallucinations a this time.  A.  Support and encouragement offered, medications given as ordered  R.  Pt remains safe on unit, will continue to monitor.

## 2015-09-15 NOTE — Progress Notes (Signed)
[facility] MD Progress Note  09/15/2015 12:10 PM Justin Fisher  MRN:  [id number] Subjective:  Patient reports " I am feeling so today. I am still waiting to hear back from [facility]" (residential treatment facility)  Objective:  Justin Fisher is awake, alert and oriented X4 , found resting in bedroom with his t-shirt covering his eyes.  Patient reports I am okay I don't need to talk to you today.Patient is disengaged throughout this discussion. Re-assessment Patient reports he is waiting for social worker to make a referral to [facility].    Denies suicidal or homicidal ideation. Denies auditory or visual hallucination. Patient reports he is medication compliant without medication side effects. States his depression 5/10. Reports eating and sleeping well. Patient reports taken Zyprexa in the past for mood stabilization and is requesting to be restated on this mediation.  Per staff notes patient is not resting well throughout the night. Patient is requesting to move the time for night time medications. Support, encouragement and reassurance was provided.   Principal Problem: MDD (major depressive disorder), recurrent severe, without psychosis (HCC) Diagnosis:   Patient Active Problem List   Diagnosis Date Noted  . MDD (major depressive disorder), recurrent severe, without psychosis (HCC) [F33.2] 09/11/2015  . Alcohol use disorder, moderate, dependence (HCC) [F10.20] 09/11/2015   Total Time spent with patient: 20 minutes  Past Psychiatric History: see admission H and P  Past Medical History:  Past Medical History  Diagnosis Date  . Anxiety    History reviewed. No pertinent past surgical history. Family History: History reviewed. No pertinent family history. Family Psychiatric  History: see admission H and P Social History:  History  Alcohol Use  . Yes     History  Drug Use No    Social History   Social History  . Marital Status: Single    Spouse Name: N/A  . Number of Children: N/A  . Years  of Education: N/A   Social History Main Topics  . Smoking status: Current Every Day Smoker -- 0.50 packs/day  . Smokeless tobacco: None  . Alcohol Use: Yes  . Drug Use: No  . Sexual Activity: Not Asked   Other Topics Concern  . None   Social History Narrative   Additional Social History:                         Sleep: Poor  Appetite:  Fair  Current Medications: Current Facility-Administered Medications  Medication Dose Route Frequency Provider Last Rate Last Dose  . acetaminophen (TYLENOL) tablet 650 mg  650 mg Oral Q6H PRN Sanjuana Kava, NP      . alum & mag hydroxide-simeth (MAALOX/MYLANTA) 200-200-20 MG/5ML suspension 30 mL  30 mL Oral Q4H PRN Sanjuana Kava, NP      . gabapentin (NEURONTIN) tablet 800 mg  800 mg Oral TID PC & HS Sanjuana Kava, NP   800 mg at 09/15/15 1144  . hydrOXYzine (ATARAX/VISTARIL) tablet 25 mg  25 mg Oral Q6H PRN Oneta Rack, NP   25 mg at 09/14/15 1730  . hydrOXYzine (ATARAX/VISTARIL) tablet 50 mg  50 mg Oral QHS,MR X 1 Oneta Rack, NP   50 mg at 09/14/15 2202  . lamoTRIgine (LAMICTAL) tablet 100 mg  100 mg Oral Daily Rachael Fee, MD   100 mg at 09/15/15 1144  . magnesium hydroxide (MILK OF MAGNESIA) suspension 30 mL  30 mL Oral Daily PRN Sanjuana Kava, NP      .  multivitamin with minerals tablet 1 tablet  1 tablet Oral Daily Jomarie LongsSaramma Eappen, MD   1 tablet at 09/15/15 1142  . naproxen (NAPROSYN) tablet 500 mg  500 mg Oral BID Sanjuana KavaAgnes I Nwoko, NP   500 mg at 09/15/15 1143  . nicotine polacrilex (NICORETTE) gum 2 mg  2 mg Oral PRN Rachael FeeIrving A Lugo, MD   2 mg at 09/15/15 1145  . sertraline (ZOLOFT) tablet 200 mg  200 mg Oral Daily Rachael FeeIrving A Lugo, MD   200 mg at 09/15/15 1143  . thiamine (B-1) injection 100 mg  100 mg Intramuscular Once Jomarie LongsSaramma Eappen, MD   100 mg at 09/11/15 1216  . thiamine (VITAMIN B-1) tablet 100 mg  100 mg Oral Daily Saramma Eappen, MD   100 mg at 09/15/15 1144  . traZODone (DESYREL) tablet 200 mg  200 mg Oral Q2000 Sanjuana KavaAgnes I  Nwoko, NP   200 mg at 09/14/15 2202    Lab Results:  No results found for this or any previous visit (from the past 48 hour(s)).  Blood Alcohol level:  Lab Results  Component Value Date   ETH 78* 09/10/2015    Physical Findings: AIMS: Facial and Oral Movements Muscles of Facial Expression: None, normal Lips and Perioral Area: None, normal Jaw: None, normal Tongue: None, normal,Extremity Movements Upper (arms, wrists, hands, fingers): None, normal Lower (legs, knees, ankles, toes): None, normal, Trunk Movements Neck, shoulders, hips: None, normal, Overall Severity Severity of abnormal movements (highest score from questions above): None, normal Incapacitation due to abnormal movements: None, normal Patient's awareness of abnormal movements (rate only patient's report): No Awareness, Dental Status Current problems with teeth and/or dentures?: No Does patient usually wear dentures?: No  CIWA:  CIWA-Ar Total: 0 COWS:     Musculoskeletal: Strength & Muscle Tone: within normal limits Gait & Station: normal Patient leans: normal  Psychiatric Specialty Exam: Review of Systems  Constitutional: Negative.   HENT: Negative.   Eyes: Negative.   Respiratory: Negative.   Cardiovascular: Negative.   Gastrointestinal: Negative.   Genitourinary: Negative.   Musculoskeletal: Negative.   Skin: Negative.   Neurological: Negative.   Endo/Heme/Allergies: Negative.   Psychiatric/Behavioral: Positive for depression and substance abuse. The patient is nervous/anxious.   All other systems reviewed and are negative.   Blood pressure 115/66, pulse 77, temperature 98.4 F (36.9 C), temperature source Oral, resp. rate 18, height 5\' 7"  (1.702 m), weight 102.513 kg (226 lb), SpO2 99 %.Body mass index is 35.39 kg/(m^2).  General Appearance: Casual  Eye Contact::  None patient eyes covered.    Speech:  Clear and Coherent  Volume:  fluctuates  Mood:  Anxious, Depressed and worried  Affect:   Restricted  Thought Process:  Coherent and Goal Directed  Orientation:  Full (Time, Place, and Person)  Thought Content:  symptoms events worries concerns  Suicidal Thoughts:  No  Homicidal Thoughts:  No  Memory:  Immediate;   Fair Recent;   Fair Remote;   Fair  Judgement:  Fair  Insight:  Present and Shallow  Psychomotor Activity:  Restlessness-improving throughout the day  Concentration:  Fair  Recall:  FiservFair  Fund of Knowledge:Fair  Language: Fair  Akathisia:  No  Handed:  Right  AIMS (if indicated):     Assets:  Desire for Improvement  ADL's:  Intact  Cognition: WNL  Sleep:  Number of Hours: 4.75   I agree with current treatment plan on 09/15/2015, Patient seen face-to-face for psychiatric evaluation follow-up, chart reviewed and case  discussed with the MD Deziree Mokry. Reviewed the information documented and agree with the treatment plan.  Treatment Plan Summary: Daily contact with patient to assess and evaluate symptoms and progress in treatment and Medication management   Supportive approach/coping skills Alcohol dependence; continue the Librium detox protocol/work a relapse prevention plan Depression; continue the Zoloft 200 mg daily Mood instability; continue the Lamictal 100 mg daily Insomnia; will continue the Trazodone 200 mg and reassess effectiveness. Start vistaril 50 mg with 1x repeat dose. (Adjusted QHS to 2130)  Work with CBT/mindfulness Explore outpatient treatment options  Oneta Rack, NP 09/15/2015, 12:10 PM  I reviewed chart and agreed with the findings and treatment Plan.  Kathryne Sharper, MD

## 2015-09-16 NOTE — Progress Notes (Signed)
CSW called Winferd Humphreyee Gray [phone] and requested that he meet with pt to discuss [facility]. Tee arrived at 3:00PM to speak with pt.   Trula SladeHeather Smart, MSW, LCSW Clinical Social Worker 09/16/2015 3:12 PM

## 2015-09-16 NOTE — Progress Notes (Signed)
Recreation Therapy Notes  Date: 03.13.2017 Time: 9:30am  Location: [address] Group Room   Group Topic: Stress Management  Goal Area(s) Addresses:  Patient will actively participate in stress management techniques presented during session.   Behavioral Response: Did not attend.   Lauriana Denes L Lebron Nauert, LRT/CTRS        Ohn Bostic L 09/16/2015 1:42 PM 

## 2015-09-16 NOTE — [facility] Group Notes (Signed)
[facility] LCSW Group Therapy  09/16/2015 3:00 PM  Type of Therapy:  Group Therapy  Participation Level:  Active  Participation Quality:  Attentive  Affect:  Appropriate  Cognitive:  Alert and Oriented  Insight:  Improving  Engagement in Therapy:  Improving  Modes of Intervention:  Confrontation, Discussion, Education, Exploration, Problem-solving, Rapport Building, Socialization and Support   Summary of Progress/Problems: Today's Topic: Overcoming Obstacles. Patients identified one short term goal and potential obstacles in reaching this goal. Patients processed barriers involved in overcoming these obstacles. Patients identified steps necessary for overcoming these obstacles and explored motivation (internal and external) for facing these difficulties head on. Justin Fisher was attentive and engaged during today's processing group. He shared that his biggest obstacle is finding safe and stable housing after discharge. He was open to meeting Justin Humphreyee Fisher ([facility] [employer]) today after group and has been looking at low income housing outside of city limits. Justin Fisher was receptive to information provided during group and advice/feedback from others in group. He continues to show improving insight and progress in the group setting.    Smart, Jessee Mezera LCSW 09/16/2015, 3:00 PM

## 2015-09-16 NOTE — Plan of Care (Signed)
Problem: Alteration in mood & ability to function due to Goal: STG-Patient will attend groups Outcome: Progressing Pt has attended AA groups this weekend     

## 2015-09-16 NOTE — Progress Notes (Signed)
Patient ID: Justin Fisher, male   DOB: 1986-06-16, 30 y.o.   MRN: [id number]   Pt currently presents with a masked affect and labile behavior. Per self inventory, pt has been sleeping well, states that he feels better. Pt reports "I'm bored" to the nurse. Pt remains in the dayroom, interacts with peers positively. Pt adheres to medication regimen.   Pt provided with medications per providers orders. Pt's labs and vitals were monitored throughout the day. Pt supported emotionally and encouraged to express concerns and questions. Pt educated on medications.  Pt's safety ensured with 15 minute and environmental checks. Pt currently denies SI/HI and A/V hallucinations. Pt verbally agrees to seek staff if SI/HI or A/VH occurs and to consult with staff before acting on these thoughts. Will continue POC.

## 2015-09-16 NOTE — Progress Notes (Signed)
[facility] MD Progress Note  09/16/2015 3:28 PM Justin Fisher  MRN:  [id number] Subjective:  Justin Fisher endorses that he is still trying to get his life back together. States being back on his medications he is feeling better. He states that he has support from his family mostly emotional support but they would not help him financially wise. 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 MDD (major depressive disorder), recurrent severe, without psychosis (HCC) [F33.2] 09/11/2015  . Alcohol use disorder, moderate, dependence (HCC) [F10.20] 09/11/2015   Total Time spent with patient: 20 minutes  Past Psychiatric History: see admission H and P  Past Medical History:  Past Medical History  Diagnosis Date  . Anxiety    History reviewed. No pertinent past surgical history. Family History: History reviewed. No pertinent family history. Family Psychiatric  History: see admission H and P Social History:  History  Alcohol Use  . Yes     History  Drug Use No    Social History   Social History  . Marital Status: Single    Spouse Name: N/A  . Number of Children: N/A  . Years of Education: N/A   Social History Main Topics  . Smoking status: Current Every Day Smoker -- 0.50 packs/day  . Smokeless tobacco: None  . Alcohol Use: Yes  . Drug Use: No  . Sexual Activity: Not Asked   Other Topics Concern  . None   Social History Narrative   Additional Social History:                         Sleep: Fair  Appetite:  Fair  Current Medications: Current Facility-Administered Medications  Medication Dose Route Frequency Provider Last Rate Last Dose  . acetaminophen (TYLENOL) tablet 650 mg  650 mg Oral Q6H PRN Sanjuana Kava, NP      . alum & mag  hydroxide-simeth (MAALOX/MYLANTA) 200-200-20 MG/5ML suspension 30 mL  30 mL Oral Q4H PRN Sanjuana Kava, NP      . gabapentin (NEURONTIN) tablet 800 mg  800 mg Oral TID PC & HS Sanjuana Kava, NP   800 mg at 09/16/15 1200  . hydrOXYzine (ATARAX/VISTARIL) tablet 25 mg  25 mg Oral Q6H PRN Oneta Rack, NP   25 mg at 09/15/15 2007  . hydrOXYzine (ATARAX/VISTARIL) tablet 50 mg  50 mg Oral QHS,MR X 1 Oneta Rack, NP   50 mg at 09/15/15 2201  . lamoTRIgine (LAMICTAL) tablet 100 mg  100 mg Oral Daily Rachael Fee, MD   100 mg at 09/16/15 0841  . magnesium hydroxide (MILK OF MAGNESIA) suspension 30 mL  30 mL Oral Daily PRN Sanjuana Kava, NP      . multivitamin with minerals tablet 1 tablet  1 tablet Oral Daily Jomarie Longs, MD   1 tablet at 09/16/15 0839  . naproxen (NAPROSYN) tablet 500 mg  500 mg Oral BID Sanjuana Kava, NP   500 mg at 09/16/15 0839  . nicotine polacrilex (NICORETTE) gum 2 mg  2 mg Oral PRN Rachael Fee, MD   2 mg at 09/16/15 1200  . sertraline (ZOLOFT) tablet 200 mg  200 mg Oral Daily Rachael Fee, MD  200 mg at 09/16/15 0839  . thiamine (B-1) injection 100 mg  100 mg Intramuscular Once Jomarie LongsSaramma Eappen, MD   100 mg at 09/11/15 1216  . thiamine (VITAMIN B-1) tablet 100 mg  100 mg Oral Daily Jomarie LongsSaramma Eappen, MD   100 mg at 09/16/15 0839  . traZODone (DESYREL) tablet 200 mg  200 mg Oral QHS Oneta Rackanika N Lewis, NP   200 mg at 09/15/15 2201    Lab Results: No results found for this or any previous visit (from the past 48 hour(s)).  Blood Alcohol level:  Lab Results  Component Value Date   ETH 78* 09/10/2015    Physical Findings: AIMS: Facial and Oral Movements Muscles of Facial Expression: None, normal Lips and Perioral Area: None, normal Jaw: None, normal Tongue: None, normal,Extremity Movements Upper (arms, wrists, hands, fingers): None, normal Lower (legs, knees, ankles, toes): None, normal, Trunk Movements Neck, shoulders, hips: None, normal, Overall Severity Severity of  abnormal movements (highest score from questions above): None, normal Incapacitation due to abnormal movements: None, normal Patient's awareness of abnormal movements (rate only patient's report): No Awareness, Dental Status Current problems with teeth and/or dentures?: No Does patient usually wear dentures?: No  CIWA:  CIWA-Ar Total: 0 COWS:     Musculoskeletal: Strength & Muscle Tone: within normal limits Gait & Station: normal Patient leans: normal  Psychiatric Specialty Exam: Review of Systems  Constitutional: Negative.   HENT: Negative.   Eyes: Negative.   Respiratory: Negative.   Cardiovascular: Negative.   Gastrointestinal: Negative.   Genitourinary: Negative.   Musculoskeletal: Negative.   Skin: Negative.   Neurological: Negative.   Endo/Heme/Allergies: Negative.   Psychiatric/Behavioral: Positive for depression and substance abuse. The patient is nervous/anxious.     Blood pressure 118/80, pulse 68, temperature 98.1 F (36.7 C), temperature source Oral, resp. rate 16, height 5\' 7"  (1.702 m), weight 102.513 kg (226 lb), SpO2 99 %.Body mass index is 35.39 kg/(m^2).  General Appearance: Fairly Groomed  Patent attorneyye Contact::  Fair  Speech:  Clear and Coherent  Volume:  Normal  Mood:  Anxious  Affect:  Restricted  Thought Process:  Coherent and Goal Directed  Orientation:  Full (Time, Place, and Person)  Thought Content:  symptoms events worries concerns  Suicidal Thoughts:  No  Homicidal Thoughts:  No  Memory:  Immediate;   Fair Recent;   Fair Remote;   Fair  Judgement:  Fair  Insight:  Present  Psychomotor Activity:  Normal  Concentration:  Fair  Recall:  FiservFair  Fund of Knowledge:Fair  Language: Fair  Akathisia:  No  Handed:  Right  AIMS (if indicated):     Assets:  Desire for Improvement Social Support  ADL's:  Intact  Cognition: WNL  Sleep:  Number of Hours: 5   Treatment Plan Summary: Daily contact with patient to assess and evaluate symptoms and progress  in treatment and Medication management Supportive approach/coping skills Alcohol dependence; work on a relapse prevention plan  Depression: continue the Zoloft 200 mg daily Mood instability will continue the Lamictal 100 mg Work with CBT/mindfulness Will explore placement options Briant Angelillo A, MD 09/16/2015, 3:28 PM

## 2015-09-16 NOTE — Progress Notes (Signed)
D:Patient in the dayroom on [facility].  Patient sates he had a good day.  Patient states he did not have a goal for today.  Patient states he is still working on his discharge plan.  Patient states he is homeless.  Patient denies SI/HI and denies AVH.   A: Staff to monitor Q 15 mins for safety.  Encouragement and support offered.  Scheduled medications administered per orders.  Nicorette gum administered prn R: Patient remains safe on the unit.  Patient attended group tonight.  Patient visible on the unit and interacting with peers.  Patient taking adminisitered medications.

## 2015-09-16 NOTE — [facility] Group Notes (Signed)
Adult Psychoeducational Group Note  Date:  09/16/2015 Time:  9:19 PM  Group Topic/Focus:  Wrap-Up Group:   The focus of this group is to help patients review their daily goal of treatment and discuss progress on daily workbooks.  Participation Level:  Minimal  Participation Quality:  Attentive  Affect:  Flat  Cognitive:  Alert  Insight: Good  Engagement in Group:  Limited  Modes of Intervention:  Discussion  Additional Comments:  Pt rated his day a 5.  He didn't have a goal.  He expressed that he doesn't to anything but sit around and watch television.  He stated he is discharging tomorrow.  Caroll RancherLindsay, Roxie Gueye A 09/16/2015, 9:19 PM

## 2015-09-16 NOTE — [facility] Group Notes (Signed)
[facility]   09/16/2015 9:47 AM  Participation Quality:  Invited. DID NOT ATTEND. Pt chose to remain in bed.   Smart, Kegan Mckeithan LCSW

## 2015-09-17 MED ORDER — NICOTINE POLACRILEX 2 MG MT GUM: 2.0000 mg | CHEWING_GUM | OROMUCOSAL | Status: DC | PRN

## 2015-09-17 MED ORDER — GABAPENTIN 800 MG PO TABS: 800.0000 mg | ORAL_TABLET | Freq: Three times a day (TID) | ORAL | Status: DC

## 2015-09-17 MED ORDER — NAPROXEN 500 MG PO TABS: 500.0000 mg | ORAL_TABLET | Freq: Two times a day (BID) | ORAL | Status: DC

## 2015-09-17 MED ORDER — SERTRALINE HCL 100 MG PO TABS: 200.0000 mg | ORAL_TABLET | Freq: Every day | ORAL | Status: DC

## 2015-09-17 MED ORDER — TRAZODONE HCL 100 MG PO TABS: 200.0000 mg | ORAL_TABLET | Freq: Every day | ORAL | Status: DC

## 2015-09-17 MED ORDER — LAMOTRIGINE 100 MG PO TABS: 100.0000 mg | ORAL_TABLET | Freq: Every day | ORAL | Status: DC

## 2015-09-17 MED ORDER — HYDROXYZINE HCL 25 MG PO TABS: ORAL_TABLET | ORAL | Status: DC

## 2015-09-17 NOTE — Progress Notes (Addendum)
Patient ID: Justin McardleDavid Fisher, male   DOB: 05/31/1986, 30 y.o.   MRN: [id number]  Pt currently presents with a flat affect and anxious behavior. Pt reports no current physical problems. Pt reports that he is looking forward to leaving today, states a plan [facility] and other places to find a [facility]." Pt rates depression at 2, hopelessness at 2 and anxiety at a 5.  Pt provided with medications per providers orders. Pt's labs and vitals were monitored throughout the day. Pt supported emotionally and encouraged to express concerns and questions. Pt educated on medications and suicide prevention resources.   Pt's safety ensured with 15 minute and environmental checks. Pt currently denies SI/HI and A/V hallucinations. Pt verbally agrees to seek staff if SI/HI or A/VH occurs and to consult with staff before acting on these thoughts. Pt to be discharged today per MD. Will continue POC.

## 2015-09-17 NOTE — Discharge Summary (Signed)
Physician Discharge Summary Note  Patient:  Justin Fisher is an 30 y.o., male MRN:  [id number] DOB:  10/21/1985 Patient phone:  [phone] (home)  Patient address:   [address] [place] [address],  Total Time spent with patient: Greater than 30 minutes  Date of Admission:  09/11/2015  Date of Discharge: 09-17-15  Reason for Admission: Alcohol use disorder/worsening symptoms of depression.  Principal Problem: MDD (major depressive disorder), recurrent severe, without psychosis [facility])  Discharge Diagnoses: Patient Active Problem List   Diagnosis Date Noted  . MDD (major depressive disorder), recurrent severe, without psychosis (HCC) [F33.2] 09/11/2015  . Alcohol use disorder, moderate, dependence (HCC) [F10.20] 09/11/2015   Past Psychiatric History: Major depression  Past Medical History:  Past Medical History  Diagnosis Date  . Anxiety    History reviewed. No pertinent past surgical history.  Family History: History reviewed. No pertinent family history.  Family Psychiatric  History: See H&P  Social History:  History  Alcohol Use  . Yes     History  Drug Use No    Social History   Social History  . Marital Status: Single    Spouse Name: N/A  . Number of Children: N/A  . Years of Education: N/A   Social History Main Topics  . Smoking status: Current Every Day Smoker -- 0.50 packs/day  . Smokeless tobacco: None  . Alcohol Use: Yes  . Drug Use: No  . Sexual Activity: Not Asked   Other Topics Concern  . None   Social History Narrative   Hospital Course: Justin Fisher is a 30 year old Caucasian male. Admitted to the [facility] adult unit from the [facility] ED with complaints of worsening depression & suicidal ideations. During this assessment, Justin Fisher was very irritated, agitated & unco-operative. In the beginning of this assessment, he reports, I went to the hospital yesterday because I was feeling suicidal. I was afraid I was going to hurt myself. I  don't know what the trigger was. A couple of years ago, I felt like this, not knowing why. I had attempted suicide in the past, I ate rat poison, was not hospitalized. I would say, I'm depressed for a while, but, I don't why. I saw some doctors in [place], [place], [address], but was not given any medications. I drink a lot of alcohol, I mean a lot. Why do I have to answer these questions again. I had already answered these questions while I was at [facility]. Why do I have to repeat the questions? I'm done here". Justin Fisher got up from his bed & walks to to the bathroom angrily.  Justin Fisher was admitted to the [facility] with a blood alcohol level of 78. However, on the day of his admission, he was very angry & uncooperative during the assessment that he was not able to provide the staff with any useful information pertaining to his hospitalization. Per record review, it was noted that patient has been receiving some mental health care coupled with his problem with alcoholism. After admission assessment, it was determined that he will need alcohol detoxification/mood stabilization treatments to combat any withdrawal symptoms he may experience & to stabilize his mood.   After admission assessment/evaluation, Justin Fisher was started on Librium detox protocols for alcohol detox. He was medicated & discharged on; gabapentin 800 mg for agitation, Hydroxyzine 25 & 50 mg prn respectively for anxiety, Lamictal 100 mg for mood stabilization, Sertraline 100 mg for depression & Trazodone 100 mg for insomnia. He  was enrolled & participated in the group counseling sessions being offered & held on this unit. He learned coping skills. He presented no other significant medical issues other than pain episodes that required treatment or monitoring. He tolerated his treatment regimen without any adverse effects or reactions.  Justin Fisher has completed detox treatments without complications. His mood is stable. His anxiety symptoms  stabilized. There are no evidence of substance withdrawal symptoms. He appears to be medically & mentally stable to be discharged to continue treatment as noted below. He adamantly denies any SIHI, AVH, delusional thoughts or paranoia. [facility] in no apparent distress. Transportation his arrangement.  Physical Findings: AIMS: Facial and Oral Movements Muscles of Facial Expression: None, normal Lips and Perioral Area: None, normal Jaw: None, normal Tongue: None, normal,Extremity Movements Upper (arms, wrists, hands, fingers): None, normal Lower (legs, knees, ankles, toes): None, normal, Trunk Movements Neck, shoulders, hips: None, normal, Overall Severity Severity of abnormal movements (highest score from questions above): None, normal Incapacitation due to abnormal movements: None, normal Patient's awareness of abnormal movements (rate only patient's report): No Awareness, Dental Status Current problems with teeth and/or dentures?: No Does patient usually wear dentures?: No  CIWA:  CIWA-Ar Total: 0 COWS:     Musculoskeletal: Strength & Muscle Tone: within normal limits Gait & Station: normal Patient leans: N/A  Psychiatric Specialty Exam: Review of Systems  Constitutional: Negative.   HENT: Negative.   Eyes: Negative.   Respiratory: Negative.   Cardiovascular: Negative.   Gastrointestinal: Negative.   Genitourinary: Negative.   Musculoskeletal: Negative.   Skin: Negative.   Neurological: Negative.   Endo/Heme/Allergies: Negative.   Psychiatric/Behavioral: Positive for depression (Stable) and substance abuse (Hx. Alcohol use disorder). Negative for suicidal ideas, hallucinations and memory loss. The patient has insomnia (Stable). The patient is not nervous/anxious.     Blood pressure 100/52, pulse 88, temperature 97.5 F (36.4 C), temperature source Oral, resp. rate 16, height 5\' 7"  (1.702 m), weight 102.513 kg (226 lb), SpO2 99 %.Body mass index  is 35.39 kg/(m^2).  See Md's SRA   Have you used any form of tobacco in the last 30 days? (Cigarettes, Smokeless Tobacco, Cigars, and/or Pipes): Yes  Has this patient used any form of tobacco in the last 30 days? (Cigarettes, Smokeless Tobacco, Cigars, and/or Pipes) Yes, Yes, A prescription for an FDA-approved tobacco cessation medication was offered at discharge and the patient refused  Blood Alcohol level:  Lab Results  Component Value Date   ETH 78* 09/10/2015    Metabolic Disorder Labs:  Lab Results  Component Value Date   HGBA1C 6.5* 09/12/2015   MPG 140 09/12/2015   No results found for: PROLACTIN Lab Results  Component Value Date   CHOL 215* 09/12/2015   TRIG 275* 09/12/2015   HDL 32* 09/12/2015   CHOLHDL 6.7 09/12/2015   VLDL 55* 09/12/2015   LDLCALC 128* 09/12/2015   See Psychiatric Specialty Exam and Suicide Risk Assessment completed by Attending Physician prior to discharge.  Discharge destination:  Home  Is patient on multiple antipsychotic therapies at discharge:  No   Has Patient had three or more failed trials of antipsychotic monotherapy by history:  No  Recommended Plan for Multiple Antipsychotic Therapies: NA    Medication List    STOP taking these medications        hydrOXYzine 50 MG capsule  Commonly known as:  VISTARIL      TAKE these medications      Indication  gabapentin 800 MG tablet  Commonly known as:  NEURONTIN  Take 1 tablet (800 mg total) by mouth 4 (four) times daily - after meals and at bedtime. For agitation   Indication:  Aggressive Behavior, Agitation     hydrOXYzine 25 MG tablet  Commonly known as:  ATARAX/VISTARIL  Take 1 tablet (25 mg) three times daily & 2 tablets (50 mg at bedtime: For anxiety/insomnia   Indication:  Anxiety/insomnia     lamoTRIgine 100 MG tablet  Commonly known as:  LAMICTAL  Take 1 tablet (100 mg total) by mouth daily. For mood stabilization   Indication:  Mood stabilization     naproxen 500 MG  tablet  Commonly known as:  NAPROSYN  Take 1 tablet (500 mg total) by mouth 2 (two) times daily. For moderate pain   Indication:  Mild to Moderate Pain     nicotine polacrilex 2 MG gum  Commonly known as:  NICORETTE  Take 1 each (2 mg total) by mouth as needed for smoking cessation.   Indication:  Nicotine Addiction     sertraline 100 MG tablet  Commonly known as:  ZOLOFT  Take 2 tablets (200 mg total) by mouth daily. For depression   Indication:  Major Depressive Disorder     traZODone 100 MG tablet  Commonly known as:  DESYREL  Take 2 tablets (200 mg total) by mouth at bedtime. For sleep   Indication:  Trouble Sleeping       Follow-up Information    [facility] up [facility] On 09/19/2015.   Why:  Appt on this date with Mr. [facility] for assessment at 11:00AM (medication management/individual counseling).    Contact information:   [address] [place], [place] [address] Phone: [phone] Fax: [phone]     Follow-up recommendations: Activity:  As tolerated Diet: As recommended by your primary care doctor. Keep all scheduled follow-up appointments as recommended.   Comments: Take all your medications as prescribed by your mental healthcare provider. Report any adverse effects and or reactions from your medicines to your outpatient provider promptly. Patient is instructed and cautioned to not engage in alcohol and or illegal drug use while on prescription medicines. In the event of worsening symptoms, patient is instructed to call the crisis hotline, 911 and or go to the nearest ED for appropriate evaluation and treatment of symptoms. Follow-up with your primary care provider for your other medical issues, concerns and or health care needs.   Signed: Sanjuana Kava, NP, PMHNP-BC 09/17/2015, 10:08 AM  I personally assessed the patient and formulated the plan Madie Reno A. Dub Mikes, M.D.

## 2015-09-17 NOTE — Progress Notes (Addendum)
Patient ID: Justin Fisher, male   DOB: 10-29-1985, 30 y.o.   MRN: [id number]  Pt [facility], sent via [place]. Pt was stable and appreciative at that time. All papers and prescriptions were given and valuables returned. Verbal understanding expressed. Denies SI/HI and A/VH. Pt given opportunity to express concerns and ask questions.

## 2015-09-17 NOTE — [facility] Suicide Risk Assessment (Signed)
[facility] Discharge Suicide Risk Assessment   Principal Problem: MDD (major depressive disorder), recurrent severe[facility] ([facility] Discharge Diagnoses:  Patient Active Problem List   Diagnosis Date Noted  . MDD (major depressive disorder), recurrent severe, without psychosis (HCC) [F33.2] 09/11/2015  . Alcohol use disorder, moderate, dependence (HCC) [F10.20] 09/11/2015    Total Time spent with patient: 20 minutes  Musculoskeletal: Strength & Muscle Tone: within normal limits Gait & Station: normal Patient leans: normal  Psychiatric Specialty Exam: Review of Systems  Constitutional: Negative.   HENT: Negative.   Eyes: Negative.   Respiratory: Negative.   Cardiovascular: Negative.   Gastrointestinal: Negative.   Genitourinary: Negative.   Musculoskeletal: Negative.   Skin: Negative.   Endo/Heme/Allergies: Negative.   Psychiatric/Behavioral: Positive for depression and substance abuse.    Blood pressure 100/52, pulse 88, temperature 97.5 F (36.4 C), temperature source Oral, resp. rate 16, height 5\' 7"  (1.702 m), weight 102.513 kg (226 lb), SpO2 99 %.Body mass index is 35.39 kg/(m^2).  General Appearance: Fairly Groomed  Patent attorneyye Contact::  Fair  Speech:  Clear and Coherent409  Volume:  Normal  Mood:  Euthymic  Affect:  Appropriate  Thought Process:  Coherent and Goal Directed  Orientation:  Full (Time, Place, and Person)  Thought Content:  plans as he moves on, relapse prevention plan  Suicidal Thoughts:  No  Homicidal Thoughts:  No  Memory:  Immediate;   Fair Recent;   Fair Remote;   Fair  Judgement:  Fair  Insight:  Present  Psychomotor Activity:  Normal  Concentration:  Fair  Recall:  FiservFair  Fund of Knowledge:Fair  Language: Fair  Akathisia:  No  Handed:  Right  AIMS (if indicated):     Assets:  Desire for Improvement  Sleep:  Number of Hours: 6  Cognition: WNL  ADL's:  Intact  In full contact with reality. There are no active SI plans or intent. He is  planning [facility], get a bed at the shelter and safe some money to get a more permanent placement. States he is committed to abstinence Mental Status Per Nursing Assessment::   On Admission:  Suicidal ideation indicated by patient, Suicide plan, Plan includes specific time, place, or method, Self-harm thoughts  Demographic Factors:  Caucasian  Loss Factors: financila problems  Historical Factors: none identified  Risk Reduction Factors:   Positive coping skills or problem solving skills  Continued Clinical Symptoms:  Alcohol/Substance Abuse/Dependencies  Cognitive Features That Contribute To Risk:  None    Suicide Risk:  Minimal: No identifiable suicidal ideation.  Patients presenting with no risk factors but with morbid ruminations; may be classified as minimal risk based on the severity of the depressive symptoms  Follow-up Information    [facility] up [facility] On 09/19/2015.   Why:  Appt on this date with Mr. [facility] for assessment (medication management/individual counseling).    Contact information:   [address] [place], [place] [address] Phone: [phone] Fax: [phone]      Plan Of Care/Follow-up recommendations:  Activity:  as tolerated Diet:  regular Follow up as above Dawn Kiper A, MD 09/17/2015, 9:25 AM

## 2015-09-17 NOTE — Progress Notes (Signed)
Patient ID: Beecher McardleDavid Jamison, male   DOB: 1985-12-07, 30 y.o.   MRN: [id number]  Adult Psychoeducational Group Note  Date:  09/17/2015 Time: 09:00am  Group Topic/Focus:  Self Care:   The focus of this group is to help patients understand the importance of self-care in order to improve or restore emotional, physical, spiritual, interpersonal, and financial health.  Participation Level:  Did Not Attend  Participation Quality: n/a  Affect: n/a  Cognitive: n/a  Insight: n/a  Engagement in Group: n/a  Modes of Intervention:  Activity, Discussion, Education and Support  Additional Comments:  Pt did not attend group, pt in bed asleep.   Aurora Maskwyman, Dayane Hillenburg E 09/17/2015, 9:43 AM
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
## 2015-09-17 NOTE — Progress Notes (Addendum)
  [facility] Adult [facility] Discharge Plan :  Will you be returning to the same living situation after discharge:  Yes,  shelter until he saves money for apt. (Pt plans to go through the [facility] program) At discharge, do you have transportation home?: Yes,  [facility]. Security will need to transport pt to hospital Do you have the ability to pay for your medications: Yes,  [employer] private insurance  Release of information consent forms completed and submitted to medical records by CSW.   Patient to Follow up at: Follow-up Information    [facility] On 09/19/2015 at 11:00AM   Why:  Appt on this date with Mr. [facility] for assessment (medication management/individual counseling).    Contact information:   [address] [place], [place] [address] Phone: [phone] Fax: [phone]      Next level of care provider has access to [facility] Link:no  Safety Planning and Suicide Prevention discussed: Yes,  SPE completed with pt, as he declined to consent to family contact.   Have you used any form of tobacco in the last 30 days? (Cigarettes, Smokeless Tobacco, Cigars, and/or Pipes): Yes  Has patient been referred to the Quitline?: Patient refused referral  Patient has been referred for addiction treatment: Yes-see above.   Smart, Analisia Kingsford LCSW 09/17/2015, 9:41 AM

## 2015-10-15 ENCOUNTER — Emergency Department (HOSPITAL_COMMUNITY)
Admission: EM | Admit: 2015-10-15 | Discharge: 2015-10-15 | Disposition: A | Discharge status: Home / Self Care | Payer: BLUE CROSS/BLUE SHIELD | Attending: Emergency Medicine | Admitting: Emergency Medicine

## 2015-10-15 ENCOUNTER — Encounter (HOSPITAL_COMMUNITY): Payer: Self-pay | Admitting: Emergency Medicine

## 2015-10-15 ENCOUNTER — Encounter (HOSPITAL_COMMUNITY): Payer: Self-pay | Admitting: *Deleted

## 2015-10-15 ENCOUNTER — Observation Stay (HOSPITAL_COMMUNITY)
Admission: AD | Admit: 2015-10-15 | Discharge: 2015-10-16 | Disposition: A | Discharge status: Home / Self Care | Payer: BLUE CROSS/BLUE SHIELD | Attending: Psychiatry | Admitting: Psychiatry

## 2015-10-15 DIAGNOSIS — F17200 Nicotine dependence, unspecified, uncomplicated: Secondary | ICD-10-CM | POA: Insufficient documentation

## 2015-10-15 DIAGNOSIS — R45851 Suicidal ideations: Secondary | ICD-10-CM | POA: Insufficient documentation

## 2015-10-15 DIAGNOSIS — F10229 Alcohol dependence with intoxication, unspecified: Secondary | ICD-10-CM | POA: Diagnosis not present

## 2015-10-15 DIAGNOSIS — F419 Anxiety disorder, unspecified: Secondary | ICD-10-CM | POA: Diagnosis not present

## 2015-10-15 DIAGNOSIS — Z23 Encounter for immunization: Secondary | ICD-10-CM | POA: Diagnosis not present

## 2015-10-15 DIAGNOSIS — Z791 Long term (current) use of non-steroidal anti-inflammatories (NSAID): Secondary | ICD-10-CM | POA: Diagnosis not present

## 2015-10-15 DIAGNOSIS — Z79899 Other long term (current) drug therapy: Secondary | ICD-10-CM | POA: Insufficient documentation

## 2015-10-15 DIAGNOSIS — F332 Major depressive disorder, recurrent severe without psychotic features: Secondary | ICD-10-CM | POA: Insufficient documentation

## 2015-10-15 DIAGNOSIS — F3289 Other specified depressive episodes: Secondary | ICD-10-CM

## 2015-10-15 DIAGNOSIS — F1110 Opioid abuse, uncomplicated: Secondary | ICD-10-CM | POA: Insufficient documentation

## 2015-10-15 DIAGNOSIS — Z008 Encounter for other general examination: Secondary | ICD-10-CM | POA: Diagnosis present

## 2015-10-15 DIAGNOSIS — F1024 Alcohol dependence with alcohol-induced mood disorder: Secondary | ICD-10-CM | POA: Diagnosis not present

## 2015-10-15 LAB — RAPID URINE DRUG SCREEN, HOSP PERFORMED
AMPHETAMINES: NOT DETECTED
BARBITURATES: NOT DETECTED
Benzodiazepines: NOT DETECTED
Cocaine: NOT DETECTED
OPIATES: NOT DETECTED
TETRAHYDROCANNABINOL: NOT DETECTED

## 2015-10-15 LAB — COMPREHENSIVE METABOLIC PANEL
ALBUMIN: 4.8 g/dL (ref 3.5–5.0)
ALT: 24 U/L (ref 17–63)
ANION GAP: 13 (ref 5–15)
AST: 25 U/L (ref 15–41)
Alkaline Phosphatase: 86 U/L (ref 38–126)
BUN: 17 mg/dL (ref 6–20)
CHLORIDE: 104 mmol/L (ref 101–111)
CO2: 23 mmol/L (ref 22–32)
Calcium: 9.3 mg/dL (ref 8.9–10.3)
Creatinine, Ser: 0.95 mg/dL (ref 0.61–1.24)
GFR calc Af Amer: >60 mL/min (ref >60)
GFR calc non Af Amer: >60 mL/min (ref >60)
GLUCOSE: 138 mg/dL — AB (ref 65–99)
POTASSIUM: 3.9 mmol/L (ref 3.5–5.1)
SODIUM: 140 mmol/L (ref 135–145)
TOTAL PROTEIN: 7.9 g/dL (ref 6.5–8.1)
Total Bilirubin: 0.3 mg/dL (ref 0.3–1.2)

## 2015-10-15 LAB — CBC
HEMATOCRIT: 45.2 % (ref 39.0–52.0)
HEMOGLOBIN: 15.2 g/dL (ref 13.0–17.0)
MCH: 27.9 pg (ref 26.0–34.0)
MCHC: 33.6 g/dL (ref 30.0–36.0)
MCV: 82.9 fL (ref 78.0–100.0)
Platelets: 402 10*3/uL — ABNORMAL HIGH (ref 150–400)
RBC: 5.45 MIL/uL (ref 4.22–5.81)
RDW: 14.2 % (ref 11.5–15.5)
WBC: 19 10*3/uL — ABNORMAL HIGH (ref 4.0–10.5)

## 2015-10-15 LAB — ETHANOL: Alcohol, Ethyl (B): <5 mg/dL (ref <5)

## 2015-10-15 MED ORDER — CHLORDIAZEPOXIDE HCL 25 MG PO CAPS: 25.0000 mg | ORAL_CAPSULE | Freq: Four times a day (QID) | ORAL | Status: DC | PRN

## 2015-10-15 MED ORDER — LORAZEPAM 1 MG PO TABS: 0.0000 mg | ORAL_TABLET | Freq: Two times a day (BID) | ORAL | Status: DC

## 2015-10-15 MED ORDER — PNEUMOCOCCAL VAC POLYVALENT 25 MCG/0.5ML IJ INJ
0.5000 mL | INJECTION | INTRAMUSCULAR | Status: AC
  Administered 2015-10-16: 0.5 mL via INTRAMUSCULAR

## 2015-10-15 MED ORDER — GABAPENTIN 400 MG PO CAPS
800.0000 mg | ORAL_CAPSULE | Freq: Three times a day (TID) | ORAL | Status: DC
  Administered 2015-10-15 – 2015-10-16 (×4): 800 mg via ORAL
  Filled 2015-10-15 (×4): qty 2

## 2015-10-15 MED ORDER — MAGNESIUM HYDROXIDE 400 MG/5ML PO SUSP: 30.0000 mL | Freq: Every day | ORAL | Status: DC | PRN

## 2015-10-15 MED ORDER — NICOTINE POLACRILEX 2 MG MT GUM: 2.0000 mg | CHEWING_GUM | OROMUCOSAL | Status: DC | PRN

## 2015-10-15 MED ORDER — HYDROXYZINE HCL 25 MG PO TABS: 25.0000 mg | ORAL_TABLET | Freq: Three times a day (TID) | ORAL | Status: DC | PRN

## 2015-10-15 MED ORDER — SERTRALINE HCL 50 MG PO TABS: 200.0000 mg | ORAL_TABLET | Freq: Every day | ORAL | Status: DC

## 2015-10-15 MED ORDER — LAMOTRIGINE 100 MG PO TABS
100.0000 mg | ORAL_TABLET | Freq: Every day | ORAL | Status: DC
  Administered 2015-10-15 – 2015-10-16 (×2): 100 mg via ORAL
  Filled 2015-10-15 (×2): qty 1

## 2015-10-15 MED ORDER — ALUM & MAG HYDROXIDE-SIMETH 200-200-20 MG/5ML PO SUSP: 30.0000 mL | ORAL | Status: DC | PRN

## 2015-10-15 MED ORDER — TRAZODONE HCL 100 MG PO TABS
100.0000 mg | ORAL_TABLET | Freq: Every evening | ORAL | Status: DC | PRN
  Administered 2015-10-15: 100 mg via ORAL
  Filled 2015-10-15: qty 1

## 2015-10-15 MED ORDER — LORAZEPAM 1 MG PO TABS
0.0000 mg | ORAL_TABLET | Freq: Four times a day (QID) | ORAL | Status: DC
Start: 2015-10-15 — End: 2015-10-15

## 2015-10-15 MED ORDER — NAPROXEN 500 MG PO TABS: 500.0000 mg | ORAL_TABLET | Freq: Two times a day (BID) | ORAL | Status: DC | PRN

## 2015-10-15 MED ORDER — HYDROXYZINE HCL 50 MG PO TABS
50.0000 mg | ORAL_TABLET | Freq: Four times a day (QID) | ORAL | Status: DC | PRN
  Administered 2015-10-15 – 2015-10-16 (×2): 50 mg via ORAL
  Filled 2015-10-15 (×3): qty 1

## 2015-10-15 MED ORDER — TRAZODONE HCL 100 MG PO TABS: 200.0000 mg | ORAL_TABLET | Freq: Every day | ORAL | Status: DC

## 2015-10-15 MED ORDER — LAMOTRIGINE 100 MG PO TABS
100.0000 mg | ORAL_TABLET | Freq: Every day | ORAL | Status: DC
Start: 2015-10-15 — End: 2015-10-15

## 2015-10-15 MED ORDER — SERTRALINE HCL 100 MG PO TABS
200.0000 mg | ORAL_TABLET | Freq: Every day | ORAL | Status: DC
  Administered 2015-10-15 – 2015-10-16 (×2): 200 mg via ORAL
  Filled 2015-10-15 (×2): qty 2

## 2015-10-15 MED ORDER — GABAPENTIN 800 MG PO TABS: 800.0000 mg | ORAL_TABLET | ORAL | Status: DC

## 2015-10-15 MED ORDER — NICOTINE POLACRILEX 2 MG MT GUM
2.0000 mg | CHEWING_GUM | OROMUCOSAL | Status: DC | PRN
Start: 2015-10-15 — End: 2015-10-16
  Administered 2015-10-15 – 2015-10-16 (×5): 2 mg via ORAL
  Filled 2015-10-15 (×5): qty 1

## 2015-10-15 MED ORDER — ACETAMINOPHEN 325 MG PO TABS: 650.0000 mg | ORAL_TABLET | Freq: Four times a day (QID) | ORAL | Status: DC | PRN

## 2015-10-15 NOTE — ED Provider Notes (Signed)
CSN: [id number]     Arrival date & time 10/15/15  1106 History   First MD Initiated Contact with Patient 10/15/15 1200     Chief Complaint  Patient presents with  . Medical Clearance     (Consider location/radiation/quality/duration/timing/severity/associated sxs/prior Treatment) Patient is a 30 y.o. male presenting with mental health disorder.  Mental Health Problem Presenting symptoms: depression and suicidal thoughts   Presenting symptoms: no hallucinations, no self mutilation and no suicide attempt   Degree of incapacity (severity):  Severe Onset quality:  Gradual Duration:  2 days Timing:  Constant Progression:  Improving (worse last night than now) Chronicity:  New Context: alcohol use   Context: not drug abuse, not noncompliant and not recent medication change   Context comment:  Thoughts worsened last night, drove to [facility] and slept in car, tried to check self in to [facility] this AM however needed medical clearance and came here Relieved by:  Nothing Associated symptoms: no abdominal pain, no chest pain and no headaches   Risk factors: hx of suicide attempts (years ago)     Past Medical History  Diagnosis Date  . Anxiety    History reviewed. No pertinent past surgical history. No family history on file. Social History  Substance Use Topics  . Smoking status: Current Every Day Smoker -- 0.50 packs/day  . Smokeless tobacco: None  . Alcohol Use: Yes    Review of Systems  Constitutional: Negative for fever.  HENT: Negative for sore throat.   Eyes: Negative for visual disturbance.  Respiratory: Negative for shortness of breath.   Cardiovascular: Negative for chest pain.  Gastrointestinal: Negative for abdominal pain.  Genitourinary: Negative for difficulty urinating.  Musculoskeletal: Negative for back pain and neck stiffness.  Skin: Negative for rash.  Neurological: Negative for syncope and headaches.  Psychiatric/Behavioral: Positive for suicidal ideas. Negative for  hallucinations and self-injury.      Allergies  Review of patient's allergies indicates no known allergies.  Home Medications   Prior to Admission medications   Medication Sig Start Date End Date Taking? Authorizing Provider  gabapentin (NEURONTIN) 800 MG tablet Take 1 tablet (800 mg total) by mouth 4 (four) times daily - after meals and at bedtime. For agitation Patient taking differently: Take 800 mg by mouth 4 (four) times daily. For agitation 09/17/15   Sanjuana Kava, NP  hydrOXYzine (ATARAX/VISTARIL) 25 MG tablet Take 1 tablet (25 mg) three times daily & 2 tablets (50 mg at bedtime: For anxiety/insomnia Patient taking differently: Take 50 mg by mouth every 6 (six) hours as needed for anxiety.  09/17/15   Sanjuana Kava, NP  ibuprofen (ADVIL,MOTRIN) 200 MG tablet Take 800 mg by mouth every 6 (six) hours as needed for moderate pain.    Historical Provider, MD  lamoTRIgine (LAMICTAL) 100 MG tablet Take 1 tablet (100 mg total) by mouth daily. For mood stabilization 09/17/15   Sanjuana Kava, NP  naproxen (NAPROSYN) 500 MG tablet Take 1 tablet (500 mg total) by mouth 2 (two) times daily. For moderate pain 09/17/15   Sanjuana Kava, NP  nicotine polacrilex (NICORETTE) 2 MG gum Take 1 each (2 mg total) by mouth as needed for smoking cessation. Patient not taking: Reported on 10/15/2015 09/17/15   Sanjuana Kava, NP  sertraline (ZOLOFT) 100 MG tablet Take 2 tablets (200 mg total) by mouth daily. For depression 09/17/15   Sanjuana Kava, NP  tetrahydrozoline (VISINE) 0.05 % ophthalmic solution Place 2 drops into both eyes 2 (  two) times daily as needed.    Historical Provider, MD  traZODone (DESYREL) 100 MG tablet Take 2 tablets (200 mg total) by mouth at bedtime. For sleep 09/17/15   Sanjuana KavaAgnes I Nwoko, NP   BP 129/81 mmHg  Pulse 89  Temp(Src) 97.7 F (36.5 C) (Oral)  Resp 17  SpO2 96% Physical Exam  Constitutional: He is oriented to person, place, and time. He appears well-developed and well-nourished. No  distress.  HENT:  Head: Normocephalic and atraumatic.  Eyes: Conjunctivae and EOM are normal.  Neck: Normal range of motion.  Cardiovascular: Normal rate, regular rhythm, normal heart sounds and intact distal pulses.  Exam reveals no gallop and no friction rub.   No murmur heard. Pulmonary/Chest: Effort normal and breath sounds normal. No respiratory distress. He has no wheezes. He has no rales.  Abdominal: Soft. He exhibits no distension. There is no tenderness. There is no guarding.  Musculoskeletal: He exhibits no edema.  Neurological: He is alert and oriented to person, place, and time.  Skin: Skin is warm and dry. He is not diaphoretic.  Nursing note and vitals reviewed.   ED Course  Procedures (including critical care time) Labs Review Labs Reviewed  COMPREHENSIVE METABOLIC PANEL - Abnormal; Notable for the following:    Glucose, Bld 138 (*)    All other components within normal limits  CBC - Abnormal; Notable for the following:    WBC 19.0 (*)    Platelets 402 (*)    All other components within normal limits  ETHANOL  URINE RAPID DRUG SCREEN, HOSP PERFORMED    Imaging Review No results found. I have personally reviewed and evaluated these images and lab results as part of my medical decision-making.   EKG Interpretation None      MDM   Final diagnoses:  MDD (major depressive disorder), recurrent severe, without psychosis (HCC)   29yo [facility]. Reports worsening depression and suicidal thoughts.  Denies medical problems.  Labs obtained. Leukocytosis likely secondary to steroid use last week for URI with symptoms improved now. No other significant lab abnormalities. [facility] and was taken there for evaluation of worsening depression and suicidal thoughts.  Alvira MondayErin Harika Laidlaw, MD 10/15/15 2117

## 2015-10-15 NOTE — Discharge Instructions (Signed)
Major Depressive Disorder Major depressive disorder is a mental illness. It also may be called clinical depression or unipolar depression. Major depressive disorder usually causes feelings of sadness, hopelessness, or helplessness. Some people with this disorder do not feel particularly sad but lose interest in doing things they used to enjoy (anhedonia). Major depressive disorder also can cause physical symptoms. It can interfere with work, school, relationships, and other normal everyday activities. The disorder varies in severity but is longer lasting and more serious than the sadness we all feel from time to time in our lives. Major depressive disorder often is triggered by stressful life events or major life changes. Examples of these triggers include divorce, loss of your job or home, a move, and the death of a family member or close friend. Sometimes this disorder occurs for no obvious reason at all. People who have family members with major depressive disorder or bipolar disorder are at higher risk for developing this disorder, with or without life stressors. Major depressive disorder can occur at any age. It may occur just once in your life (single episode major depressive disorder). It may occur multiple times (recurrent major depressive disorder). SYMPTOMS People with major depressive disorder have either anhedonia or depressed mood on nearly a daily basis for at least 2 weeks or longer. Symptoms of depressed mood include:  Feelings of sadness (blue or down in the dumps) or emptiness.  Feelings of hopelessness or helplessness.  Tearfulness or episodes of crying (may be observed by others).  Irritability (children and adolescents). In addition to depressed mood or anhedonia or both, people with this disorder have at least four of the following symptoms:  Difficulty sleeping or sleeping too much.   Significant change (increase or decrease) in appetite or weight.   Lack of energy or  motivation.  Feelings of guilt and worthlessness.   Difficulty concentrating, remembering, or making decisions.  Unusually slow movement (psychomotor retardation) or restlessness (as observed by others).   Recurrent wishes for death, recurrent thoughts of self-harm (suicide), or a suicide attempt. People with major depressive disorder commonly have persistent negative thoughts about themselves, other people, and the world. People with severe major depressive disorder may experiencedistorted beliefs or perceptions about the world (psychotic delusions). They also may see or hear things that are not real (psychotic hallucinations). DIAGNOSIS Major depressive disorder is diagnosed through an assessment by your health care provider. Your health care provider will ask aboutaspects of your daily life, such as mood,sleep, and appetite, to see if you have the diagnostic symptoms of major depressive disorder. Your health care provider may ask about your medical history and use of alcohol or drugs, including prescription medicines. Your health care provider also may do a physical exam and blood work. This is because certain medical conditions and the use of certain substances can cause major depressive disorder-like symptoms (secondary depression). Your health care provider also may refer you to a mental health specialist for further evaluation and treatment. TREATMENT It is important to recognize the symptoms of major depressive disorder and seek treatment. The following treatments can be prescribed for this disorder:   Medicine. Antidepressant medicines usually are prescribed. Antidepressant medicines are thought to correct chemical imbalances in the brain that are commonly associated with major depressive disorder. Other types of medicine may be added if the symptoms do not respond to antidepressant medicines alone or if psychotic delusions or hallucinations occur.  Talk therapy. Talk therapy can be  helpful in treating major depressive disorder by providing   support, education, and guidance. Certain types of talk therapy also can help with negative thinking (cognitive behavioral therapy) and with relationship issues that trigger this disorder (interpersonal therapy). A mental health specialist can help determine which treatment is best for you. Most people with major depressive disorder do well with a combination of medicine and talk therapy. Treatments involving electrical stimulation of the brain can be used in situations with extremely severe symptoms or when medicine and talk therapy do not work over time. These treatments include electroconvulsive therapy, transcranial magnetic stimulation, and vagal nerve stimulation.   This information is not intended to replace advice given to you by your health care provider. Make sure you discuss any questions you have with your health care provider.   Document Released: 10/17/2012 Document Revised: 07/13/2014 Document Reviewed: 10/17/2012 Elsevier Interactive Patient Education 2016 [employer]  

## 2015-10-15 NOTE — BH Assessment (Signed)
Per Vernona RiegerLaura, NP - patient meets criteria for OBS.  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

## 2015-10-15 NOTE — H&P (Signed)
Justin [facility] Admission Assessment Adult  Patient Identification: Justin Fisher MRN:  [id number]  Date of Evaluation:  10/15/2015  Chief Complaint:  Patient stated "I ended up running out of my medications for a few days because I did not make it to my follow up appointment."   Principal Diagnosis: Alcohol-induced depressive disorder with moderate or severe use disorder with onset during intoxication [facility])  Diagnosis:   Patient Active Problem List   Diagnosis Date Noted  . Alcohol-induced depressive disorder with moderate or severe use disorder with onset during intoxication ([place]) [F10.24, F10.229, F32.89] 10/15/2015  . MDD (major depressive disorder), recurrent severe, without psychosis ([place]) [F33.2] 09/11/2015  . Alcohol use disorder, moderate, dependence ([place]) [F10.20] 09/11/2015   History of Present Illness: Justin [facility] is a 30 year old Caucasian male. Admitted to the [facility] from the [facility] ED with complaints of worsening depression & suicidal ideations. The patient originally presented as a walk in but was sent for medical clearance due to recent alcohol relapse. He was discharged from [facility] on 09/17/2015 after being stabilized on psychotropic medications to include Zoloft, Neurontin, Lamictal, Trazodone, and Vistaril. Patient states "I was doing good on my medications and not drinking. But I kept working and did not reschedule my follow up appointment with the [facility]. It was scheduled for 09/19/2015. So I ended up running out of the medication for like two or three days. I could not deal with my anxiety and ended up drinking yesterday. I feel bad today because I drank so much.  I was really feeling bad so I called my Aunt. She told me to come back to the psychiatric hospital to get back on the medications. I feel safer now and am not having the thoughts to kill myself anymore. I feel like I could leave tomorrow after I get my  medications. I would like to have another appointment at the [facility]. This time I will make it a priority to go." His current urine drug screen is negative along with alcohol level less than five. Chace was pleasant during the interview today and showed insight into what led him to come back so soon after discharge from the [facility]. He appears motivated to make his mental health a priority and reports his medications were really helping him. Patient reports he has been living in his car but that his mother has been trying to help him find more stable housing.   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 Yes.    Has the patient been a risk to self in the past 6 months? Yes.    Has the patient been a risk to self within the distant past? Yes Is the patient a risk to others? No Has the patient been a risk to others in the past 6 months? No Has the patient been a risk to others within the distant past? No  Prior Inpatient Therapy: Yes, [place], [place], [place], [place] Prior Outpatient Therapy: Yes but recently missed his appointment with the [facility]  Alcohol Screening: 1. How often do you have a drink containing alcohol?: Monthly or less 2. How many drinks containing alcohol do  you have on a typical day when you are drinking?: 10 or more 3. How often do you have six or more drinks on one occasion?: Monthly Preliminary Score: 6 4. How often during the last year have you found that you were not able to stop drinking once you had started?: Monthly 5. How often during the last year have you failed to do what was normally expected from you becasue of  drinking?: Monthly 6. How often during the last year have you needed a first drink in the morning to get yourself going after a heavy drinking session?: Monthly 7. How often during the last year have you had a feeling of guilt of remorse after drinking?: Monthly 8. How often during the last year have you been unable to remember what happened the night before because you had been drinking?: Monthly 9. Have you or someone else been injured as a result of your drinking?: No 10. Has a relative or friend or a doctor or another health worker been concerned about your drinking or suggested you cut down?: Yes, during the last year Alcohol Use Disorder Identification Test Final Score (AUDIT): 21 Brief Intervention: Yes  Substance Abuse History in the last 12 months:  Yes.    Consequences of Substance Abuse: Medical Consequences:  Liver damage, Possible death by overdose Legal Consequences:  Arrests, jail time, Loss of driving privilege. Family Consequences:  Family discord, divorce and or separation.  Previous Psychotropic Medications: Yes (Lithium Carbonate), Effexor, Neurontin   Psychological Evaluations: Yes   Past Medical History:  Past Medical History  Diagnosis Date  . Anxiety    History reviewed. No pertinent past surgical history.  Family History: History reviewed. No pertinent family history.  Tobacco Screening: Current smoker requesting nicotine gum   Social History:  History  Alcohol Use  . Yes     History  Drug Use No    Additional Social History:  Allergies:  No Known Allergies  Lab Results:  Results for orders placed or performed during the hospital encounter of 10/15/15 (from the past 48 hour(s))  Comprehensive metabolic panel     Status: Abnormal   Collection Time: 10/15/15 11:27 AM  Result Value Ref Range   Sodium 140 135 - 145 mmol/L   Potassium 3.9 3.5 - 5.1 mmol/L   Chloride 104 101 - 111 mmol/L   CO2 23 22 - 32 mmol/L   Glucose, Bld 138 (H) 65 - 99 mg/dL    BUN 17 6 - 20 mg/dL   Creatinine, Ser 0.95 0.61 - 1.24 mg/dL   Calcium 9.3 8.9 - 10.3 mg/dL   Total Protein 7.9 6.5 - 8.1 g/dL   Albumin 4.8 3.5 - 5.0 g/dL   AST 25 15 - 41 U/L   ALT 24 17 - 63 U/L   Alkaline Phosphatase 86 38 - 126 U/L   Total Bilirubin 0.3 0.3 - 1.2 mg/dL   GFR calc non Af Amer >60 >60 mL/min   GFR calc Af Amer >60 >60 mL/min    Comment: (NOTE) The eGFR has been calculated using the CKD EPI equation. This calculation has not been validated in all clinical situations. eGFR's persistently <60 mL/min signify possible Chronic Kidney Disease.    Anion gap 13 5 - 15  Ethanol (ETOH)     Status: None   Collection Time: 10/15/15 11:27 AM  Result Value Ref Range   Alcohol, Ethyl (B) <5 <5 mg/dL    Comment:        LOWEST  DETECTABLE LIMIT FOR SERUM ALCOHOL IS 5 mg/dL FOR MEDICAL PURPOSES ONLY   CBC     Status: Abnormal   Collection Time: 10/15/15 11:27 AM  Result Value Ref Range   WBC 19.0 (H) 4.0 - 10.5 K/uL   RBC 5.45 4.22 - 5.81 MIL/uL   Hemoglobin 15.2 13.0 - 17.0 g/dL   HCT 45.2 39.0 - 52.0 %   MCV 82.9 78.0 - 100.0 fL   MCH 27.9 26.0 - 34.0 pg   MCHC 33.6 30.0 - 36.0 g/dL   RDW 14.2 11.5 - 15.5 %   Platelets 402 (H) 150 - 400 K/uL  Urine rapid drug screen (hosp performed) (Not at [facility])     Status: None   Collection Time: 10/15/15 12:20 PM  Result Value Ref Range   Opiates NONE DETECTED NONE DETECTED   Cocaine NONE DETECTED NONE DETECTED   Benzodiazepines NONE DETECTED NONE DETECTED   Amphetamines NONE DETECTED NONE DETECTED   Tetrahydrocannabinol NONE DETECTED NONE DETECTED   Barbiturates NONE DETECTED NONE DETECTED    Comment:        DRUG SCREEN FOR MEDICAL PURPOSES ONLY.  IF CONFIRMATION IS NEEDED FOR ANY PURPOSE, NOTIFY LAB WITHIN 5 DAYS.        LOWEST DETECTABLE LIMITS FOR URINE DRUG SCREEN Drug Class       Cutoff (ng/mL) Amphetamine      1000 Barbiturate      200 Benzodiazepine   768 Tricyclics       088 Opiates          300 Cocaine           300 THC              50    Blood Alcohol level:  Lab Results  Component Value Date   ETH <5 10/15/2015   ETH 78* 05/08/1593   Metabolic Disorder Labs:  Lab Results  Component Value Date   HGBA1C 6.5* 09/12/2015   MPG 140 09/12/2015   No results found for: PROLACTIN Lab Results  Component Value Date   CHOL 215* 09/12/2015   TRIG 275* 09/12/2015   HDL 32* 09/12/2015   CHOLHDL 6.7 09/12/2015   VLDL 55* 09/12/2015   LDLCALC 128* 09/12/2015    Current Medications: Current Facility-Administered Medications  Medication Dose Route Frequency Provider Last Rate Last Dose  . acetaminophen (TYLENOL) tablet 650 mg  650 mg Oral Q6H PRN Niel Hummer, NP      . alum & mag hydroxide-simeth (MAALOX/MYLANTA) 200-200-20 MG/5ML suspension 30 mL  30 mL Oral Q4H PRN Niel Hummer, NP      . chlordiazePOXIDE (LIBRIUM) capsule 25 mg  25 mg Oral Q6H PRN Niel Hummer, NP      . gabapentin (NEURONTIN) capsule 800 mg  800 mg Oral TID WC & HS Niel Hummer, NP   800 mg at 10/15/15 1712  . hydrOXYzine (ATARAX/VISTARIL) tablet 50 mg  50 mg Oral Q6H PRN Niel Hummer, NP      . lamoTRIgine (LAMICTAL) tablet 100 mg  100 mg Oral Daily Niel Hummer, NP   100 mg at 10/15/15 1712  . magnesium hydroxide (MILK OF MAGNESIA) suspension 30 mL  30 mL Oral Daily PRN Niel Hummer, NP      . nicotine polacrilex (NICORETTE) gum 2 mg  2 mg Oral PRN Niel Hummer, NP   2 mg at 10/15/15 1605  . [START ON 10/16/2015] pneumococcal 23 valent vaccine (PNU-IMMUNE) injection 0.5 mL  0.5 mL Intramuscular Tomorrow-1000 Niel Hummer, NP      . sertraline (ZOLOFT) tablet 200 mg  200 mg Oral Daily Niel Hummer, NP   200 mg at 10/15/15 1712  . traZODone (DESYREL) tablet 100 mg  100 mg Oral QHS PRN Niel Hummer, NP       PTA Medications: Prescriptions prior to admission  Medication Sig Dispense Refill Last Dose  . gabapentin (NEURONTIN) 800 MG tablet Take 1 tablet (800 mg total) by mouth 4 (four) times daily - after meals  and at bedtime. For agitation (Patient taking differently: Take 800 mg by mouth 4 (four) times daily. For agitation) 120 tablet 0 10/11/2015  . hydrOXYzine (ATARAX/VISTARIL) 25 MG tablet Take 1 tablet (25 mg) three times daily & 2 tablets (50 mg at bedtime: For anxiety/insomnia (Patient taking differently: Take 50 mg by mouth every 6 (six) hours as needed for anxiety. ) 90 tablet 0 10/13/2015  . ibuprofen (ADVIL,MOTRIN) 200 MG tablet Take 800 mg by mouth every 6 (six) hours as needed for moderate pain.   Past Month at Unknown time  . lamoTRIgine (LAMICTAL) 100 MG tablet Take 1 tablet (100 mg total) by mouth daily. For mood stabilization 30 tablet 0 10/14/2015 at Unknown time  . naproxen (NAPROSYN) 500 MG tablet Take 1 tablet (500 mg total) by mouth 2 (two) times daily. For moderate pain 1 tablet 0 10/14/2015 at Unknown time  . sertraline (ZOLOFT) 100 MG tablet Take 2 tablets (200 mg total) by mouth daily. For depression 30 tablet 0 10/14/2015 at Unknown time  . tetrahydrozoline (VISINE) 0.05 % ophthalmic solution Place 2 drops into both eyes 2 (two) times daily as needed.   Past Week at Unknown time  . traZODone (DESYREL) 100 MG tablet Take 2 tablets (200 mg total) by mouth at bedtime. For sleep 60 tablet 0 10/14/2015 at Unknown time  . nicotine polacrilex (NICORETTE) 2 MG gum Take 1 each (2 mg total) by mouth as needed for smoking cessation. (Patient not taking: Reported on 10/15/2015) 100 tablet 0    Musculoskeletal: Strength & Muscle Tone: within normal limits Gait & Station: normal Patient leans: N/A  Psychiatric Specialty Exam: Physical Exam  Genitourinary:  Denies any issues in this area  Psychiatric: His speech is normal. His mood appears anxious. His affect is angry and inappropriate. His affect is not blunt and not labile. He is agitated and aggressive. Cognition and memory are normal. He expresses impulsivity and inappropriate judgment. He exhibits a depressed mood.    Review of Systems   Constitutional: Negative.   HENT: Negative.   Eyes: Negative.   Respiratory: Negative.   Cardiovascular: Negative.   Gastrointestinal: Negative.   Genitourinary: Negative.   Musculoskeletal: Negative.   Skin: Negative.   Neurological: Negative.   Endo/Heme/Allergies: Negative.   Psychiatric/Behavioral: Positive for depression and substance abuse (Alcoholism,). Negative for suicidal ideas, hallucinations and memory loss. The patient is nervous/anxious and has insomnia.     Blood pressure 144/72, pulse 76, temperature 98 F (36.7 C), temperature source Oral, resp. rate 16, height 5' 7"  (1.702 m), weight 103.42 kg (228 lb), SpO2 99 %.Body mass index is 35.7 kg/(m^2).  General Appearance: Casual  Eye Contact::  Good  Speech:  Clear and Coherent  Volume:  Normal  Mood:  Anxious and Depressed  Affect:  Congruent  Thought Process:  Coherent and Goal Directed  Orientation:  Full (Time, Place, and Person)  Thought Content:  Symptoms, worries, concerns   Suicidal Thoughts:  No  Homicidal Thoughts:  No  Memory:  Immediate;   Good Recent;   Good Remote;   Good  Judgement:  Impaired  Insight:  Lacking  Psychomotor Activity:  Normal  Concentration:  Good  Recall:  Good  Fund of Knowledge:Fair  Language: Good  Akathisia:  No  Handed:  Right  AIMS (if indicated):     Assets:  Communication Skills Desire for Improvement Financial Resources/Insurance Leisure Time Physical Health Resilience Social Support Vocational/Educational  ADL's:  Intact  Cognition: WNL  Sleep:      Treatment Plan/Recommendations: 1. Admit for crisis management and stabilization, estimated length of stay 24 hours.   2. Medication management to reduce current symptoms to base line and improve the patient's overall level of functioning:  Librium prn due to recent alcohol consumption, resume Gabapentin 800 mg for agitation & Trazodone 200 mg for insomnia, Zoloft, Lamictal at previous dosages.  3. Treat health  problems as indicated.  4. Develop treatment plan to decrease risk of relapse upon discharge and the need for readmission.  5. Psycho-social education regarding relapse prevention and self care.  6. Health care follow up as needed for medical problems.  7. Review, reconcile, and reinstate any pertinent home medications for other health issues where appropriate. 8. Call for consults with hospitalist for any additional specialty patient care services as needed.  Observation Level/Precautions:  Continuous Observation  Laboratory:  CBC Chemistry Profile UDS  Psychotherapy: Individual for substance abuse counseling   Medications: Librium 25 mg prn, resume Gabapentin 800 mg, Trazodone 200 mg for insomnia, Zoloft 200 mg daily for depression, Lamictal 100 mg for mood stabilization   Consultations:  Was sent for medical clearance at the ED  Discharge Concerns: Compliance with follow up recommendations  Estimated LOS: Less than 24 hours  Other:  Needs appointment for outpatient follow up    Elmarie Shiley, NP, NP-C 4/11/20175:17 PM

## 2015-10-15 NOTE — Progress Notes (Signed)
D:Pt admitted to the OBS unit with SI thoughts yesterday to crash his car or lay on a railroad track. Pt denies thoughts currently. He denies HI and hallucinations.  Pt reports that he was kicked out of [facility] and does not want to go to AA or NA. He reports that he ran out of his medications and did not go to his follow [facility]. Pt reports a hx of suicide attempts and cutting. He reports drinking 4 to 6 beer yesterday and a pint of liquor. Pt's BAL was less than 5. Pt is restless on admission and wants to get started back on medication including nicorette gum.  Pt currently has a job and lives in his car.

## 2015-10-15 NOTE — BH Assessment (Addendum)
Assessment Note  Justin Fisher is a 30 year old Caucasian male that requests detox from alcohol.  Patient reports SI with a plan to sit on the railroad track due to his inability to stop drinking.  Patient reports that his drinking has caused him to become homeless.  Patient reports that he is now living in his car.   Patient reports increased depression associated with his inability to stop drinking.  Patient reports that he has also been non-compliant with taking his psychiatric medication or the past week.  Patient reports that he did not follow up with a community outpatient Psychiatrist to have his prescription refilled.    Patient reports prior inpatient psychiatric hospitalization at [facility] in March 2017.  Patient reports that he has also been to other detox facilities in the past.  Patient was not able to remember the name or the year in which he received therapy.   Patient reports that he began drinking at the age of 29 years old.  Patient reports that he became addicted at the age of 30 years old.  Patient reports that he his last drink was yesterday when he drank whiskey and beer.  Patient denies withdrawal symptoms or a history of seizure.   Patient denies HI/Psychosis.  Patient denies physical, sexual or emotional abuse.     Diagnosis: 291.89 Alcohol-induced depressive disorder, With moderate or severe use disorder   Past Medical History:  Past Medical History  Diagnosis Date  . Anxiety     No past surgical history on file.  Family History: No family history on file.  Social History:  reports that he has been smoking.  He does not have any smokeless tobacco history on file. He reports that he drinks alcohol. He reports that he does not use illicit drugs.  Additional Social History:  Alcohol / Drug Use History of alcohol / drug use?: Yes Longest period of sobriety (when/how long): Unable to remember  Negative Consequences of Use: Financial, Personal relationships, Work /  School Withdrawal Symptoms:  (None Reported) Substance #1 Name of Substance 1: Alcohol 1 - Age of First Use: 13 1 - Amount (size/oz): varies 1 - Frequency: daily 1 - Duration: since the age of 16 1 - Last Use / Amount: yesterday when he drank whisky and beer   CIWA:   COWS:    Allergies: No Known Allergies  Home Medications:  (Not in a hospital admission)  OB/GYN Status:  No LMP for male patient.  General Assessment Data [facility] Assessment[facility] ([facility] TTS Assessment: In system Is this a Tele or Face-to-Face Assessment?: Face-to-Face Is this an Initial Assessment or a Re-assessment for this encounter?: Initial Assessment Marital status: Single Maiden name: NA Is patient pregnant?: No Pregnancy Status: No Living Arrangements:  (Homeless, living in his car) Can pt return to current living arrangement?: Yes Admission Status: Voluntary Is patient capable of signing voluntary admission?: Yes Referral Source: Self/Family/Friend Insurance type: Scientist, research (physical sciences) Exam [facility] Walk-in ONLY) Medical Exam completed: Yes  Crisis Care Plan Living Arrangements:  (Homeless, living in his car) Legal Guardian:  (NA) Name of Psychiatrist: None Reported Name of Therapist: None Reported  Education Status Is patient currently in school?: No Current Grade: NA Highest grade of school patient has completed: NA Name of school: NA Contact person: NA  Risk to self with the past 6 months Suicidal Ideation: Yes-Currently Present Has patient been a risk to self within the past 6 months prior  to admission? : Yes Suicidal Intent: Yes-Currently Present Has patient had any suicidal intent within the past 6 months prior to admission? : Yes Is patient at risk for suicide?: Yes Suicidal Plan?: Yes-Currently Present Has patient had any suicidal plan within the past 6 months prior to admission? : Yes Specify Current Suicidal Plan: Place his car on a rail road  tracks Access to Means: Yes Specify Access to Suicidal Means: Car What has been your use of drugs/alcohol within the last 12 months?: Alcohol Previous Attempts/Gestures: Yes How many times?: 0 Other Self Harm Risks: None  Triggers for Past Attempts:  (NA) Intentional Self Injurious Behavior: None Family Suicide History: No Recent stressful life event(s): Conflict (Comment), Job Loss, Financial Problems Persecutory voices/beliefs?: No Depression: Yes Depression Symptoms: Despondent, Isolating, Fatigue, Feeling worthless/self pity, Loss of interest in usual pleasures, Guilt Substance abuse history and/or treatment for substance abuse?: Yes Suicide prevention information given to non-admitted patients: Yes  Risk to Others within the past 6 months Homicidal Ideation: No Does patient have any lifetime risk of violence toward others beyond the six months prior to admission? : No Thoughts of Harm to Others: No Current Homicidal Intent: No Current Homicidal Plan: No Access to Homicidal Means: No Identified Victim: NA History of harm to others?: No Assessment of Violence: None Noted Violent Behavior Description: NA Does patient have access to weapons?: No Criminal Charges Pending?: No Does patient have a court date: No Is patient on probation?: No  Psychosis Hallucinations: None noted Delusions: None noted  Mental Status Report Appearance/Hygiene: Disheveled, Poor hygiene Eye Contact: Fair Motor Activity: Freedom of movement, Restlessness Speech: Logical/coherent Level of Consciousness: Alert Mood: Depressed, Anxious, Worthless, low self-esteem Affect: Anxious, Blunted, Depressed Anxiety Level: Minimal Thought Processes: Coherent, Relevant Judgement: Unimpaired Orientation: Person, Place, Time, Situation Obsessive Compulsive Thoughts/Behaviors: None  Cognitive Functioning Memory: Recent Intact, Remote Intact IQ: Average Insight: Fair Impulse Control: Poor Appetite:  Fair Weight Loss: 0 Weight Gain: 0 Sleep: Decreased Total Hours of Sleep: 5 Vegetative Symptoms: Decreased grooming, Staying in bed, Not bathing  ADLScreening [facility]) Patient's cognitive ability adequate to safely complete daily activities?: Yes Patient able to express need for assistance with ADLs?: Yes Independently performs ADLs?: Yes (appropriate for developmental age)  Prior Inpatient Therapy Prior Inpatient Therapy: Yes Prior Therapy Dates: 09-2015 Prior Therapy Facilty/Provider(s): [facility] Reason for Treatment: SI  Prior Outpatient Therapy Prior Outpatient Therapy: Yes Prior Therapy Dates: Ongoing  Prior Therapy Facilty/Provider(s): None Reported Reason for Treatment: NA Does patient have an ACCT team?: No Does patient have Intensive In-House Services?  : No Does patient have [facility] services? : No Does patient have P4CC services?: No  ADL Screening (condition at time of admission) Patient's cognitive ability adequate to safely complete daily activities?: Yes Is the patient deaf or have difficulty hearing?: No Does the patient have difficulty seeing, even when wearing glasses/contacts?: No Does the patient have difficulty concentrating, remembering, or making decisions?: No Patient able to express need for assistance with ADLs?: Yes Does the patient have difficulty dressing or bathing?: No Independently performs ADLs?: Yes (appropriate for developmental age) Does the patient have difficulty walking or climbing stairs?: No Weakness of Legs: None Weakness of Arms/Hands: None  Home Assistive Devices/Equipment Home Assistive Devices/Equipment: None    Abuse/Neglect Assessment (Assessment to be complete while patient is alone) Physical Abuse: Denies Verbal Abuse: Denies Sexual Abuse: Denies Exploitation of patient/patient's resources: Denies Self-Neglect: Denies Values / Beliefs Cultural Requests During Hospitalization: None Spiritual Requests During  Hospitalization:  None Consults Spiritual Care Consult Needed: No Social Work Consult Needed: No Merchant navy officerAdvance Directives (For Healthcare) Does patient have an advance directive?: No Would patient like information on creating an advanced directive?: No - patient declined information    Additional Information 1:1 In Past 12 Months?: No CIRT Risk: No Elopement Risk: No     Disposition: Per Vernona RiegerLaura, NP - patient meets criteria for the OBS Unit.  Per AV Delorise Jackson(Tori) patient accepted to OBS Bed 3.  [facility]  Disposition Initial Assessment Completed for this Encounter: Yes Disposition of Patient: Referred to Patient referred to:  (OBS)  On Site Evaluation by:   Reviewed with Physician:    Phillip HealStevenson, Lewayne Pauley LaVerne 10/15/2015 11:10 AM

## 2015-10-15 NOTE — ED Notes (Signed)
Per pt, [facility]-here for medical clearance [facility]

## 2015-10-15 NOTE — Progress Notes (Signed)
Shift note:  Justin Fisher awake and watching TV at shift change.  Pt pleasant and cooperative and appropriate for circumstances.  Pt denies any withdrawal symptoms.  Pt is visible hot and asks for temp to be turned down.  Pt has rolled up pant legs and shirt sleeves. Pt asks how early he can have his sleep medication.  Pt asking for snacks and drinks. Pt given drinks and snacks, pts meds explained to him.   Pt eating snacks, watching TV and verbalizes his disappointment that his Trazodone is not double [facility], since that is the strength he takes at home.

## 2015-10-16 DIAGNOSIS — F1024 Alcohol dependence with alcohol-induced mood disorder: Secondary | ICD-10-CM | POA: Diagnosis not present

## 2015-10-16 DIAGNOSIS — F3289 Other specified depressive episodes: Secondary | ICD-10-CM | POA: Diagnosis not present

## 2015-10-16 DIAGNOSIS — F332 Major depressive disorder, recurrent severe without psychotic features: Secondary | ICD-10-CM

## 2015-10-16 DIAGNOSIS — F10229 Alcohol dependence with intoxication, unspecified: Secondary | ICD-10-CM

## 2015-10-16 MED ORDER — HYDROXYZINE HCL 25 MG PO TABS: 50.0000 mg | ORAL_TABLET | Freq: Four times a day (QID) | ORAL | Status: DC | PRN

## 2015-10-16 MED ORDER — TRAZODONE HCL 100 MG PO TABS: 100.0000 mg | ORAL_TABLET | Freq: Every evening | ORAL | Status: DC | PRN

## 2015-10-16 MED ORDER — LAMOTRIGINE 100 MG PO TABS: 100.0000 mg | ORAL_TABLET | Freq: Every day | ORAL | Status: DC

## 2015-10-16 MED ORDER — SERTRALINE HCL 100 MG PO TABS: 200.0000 mg | ORAL_TABLET | Freq: Every day | ORAL | Status: DC

## 2015-10-16 MED ORDER — NICOTINE POLACRILEX 2 MG MT GUM: 2.0000 mg | CHEWING_GUM | OROMUCOSAL | Status: DC | PRN

## 2015-10-16 MED ORDER — GABAPENTIN 800 MG PO TABS: 800.0000 mg | ORAL_TABLET | Freq: Three times a day (TID) | ORAL | Status: DC

## 2015-10-16 NOTE — Progress Notes (Signed)
D: Pt d/c home as ordered. Pt cooperative with d/c procedure and compliant with scheduled medication regimen. Denies SI, HI, AVH and pain when assessed. Pt presents with animated affect and pleasant mood. Pt verbalized understanding of d/c instructions. Signed belonging sheet in agreement with items received from locker 59.  A: Support and availability provided to pt. Medications administered as prescribed including PRN Vistaril and nicotine gum as per pt's request for c/o anxiety and nicotine cravings. Encouragement, availability and supported offer to pt. All belongings in locker 59 returned to pt. D/C instructions reviewed with pt prior to departure. R: Pt receptive to care. Denies adverse drug reactions. Safety maintained in observation unit till time of d/c without incident.

## 2015-10-16 NOTE — Discharge Instructions (Signed)
Patient will be discharged this date and follow up with outpatient services at "[facility]." Patient has been receiving services there and has the desire to continue with outpatient services at that location to address substance abuse issues and medication management. This Clinical research associatewriter started a relapse prevention and assisted patient with a trigger identification sheet to utilize for aftercare. Patient was also provided with outpatient resources and information on relapse prevention. Patient was encouraged to consider obtaining a sponsor and was provided with a AA/NA meeting schedule for the area. This Clinical research associatewriter also provided patient with information on relapse prevention and the stages of change.

## 2015-10-16 NOTE — BH Assessment (Signed)
[facility] Assessment Progress Note Patient will be discharged this date and follow up with outpatient services at "[facility]." Patient has been receiving services there and has the desire to continue with outpatient services at that location to address substance abuse issues and medication management. This Clinical research associatewriter started a relapse prevention and assisted patient with a trigger identification sheet to utilize for aftercare. Patient was also provided with outpatient resources and information on relapse prevention. Patient was encouraged to consider obtaining a sponsor and was provided with a AA/NA meeting schedule for the area. This Clinical research associatewriter also provided patient with information on relapse prevention and the stages of change.

## 2015-10-16 NOTE — Discharge Summary (Signed)
[facility] OBS UNIT DISCHARGE SUMMARY  Patient Identification: Justin Fisher MRN:  [id number]  Date of Evaluation:  10/16/2015  Principal Diagnosis: Alcohol-induced depressive disorder with moderate or severe use disorder with onset during intoxication [facility])  Diagnosis:   Patient Active Problem List   Diagnosis Date Noted  . Alcohol-induced depressive disorder with moderate or severe use disorder with onset during intoxication ([place]) [F10.24, F10.229, F32.89] 10/15/2015  . MDD (major depressive disorder), recurrent severe, without psychosis ([place]) [F33.2] 09/11/2015  . Alcohol use disorder, moderate, dependence ([place]) [F10.20] 09/11/2015   Subjective: Pt seen and chart reviewed. Pt is alert/oriented x4, calm, cooperative, and appropriate to situation. Pt denies suicidal/homicidal ideation and psychosis and does not appear to be responding to internal stimuli. Pt reports that he felt suicidal yesterday but now feels much better with the support system, restarting medications he had been off of for 3 days, and care coordination by the OBS UNIT staff members. Pt would like to discharge home now.   History of Present Illness: I have reviewed and concur with HPI elements below from my colleague Elmarie Shiley, NP, modified as follows: Justin Fisher is a 30 year old Caucasian male. Admitted to the [facility] from the [facility] ED with complaints of worsening depression & suicidal ideations. The patient originally presented as a walk in but was sent for medical clearance due to recent alcohol relapse. He was discharged from [facility] adult unit on 09/17/2015 after being stabilized on psychotropic medications to include Zoloft, Neurontin, Lamictal, Trazodone, and Vistaril. Patient states "I was doing good on my medications and not drinking. But I kept working and did not reschedule my follow up appointment with the [facility]. It was scheduled for 09/19/2015. So I ended up running out of the  medication for like two or three days. I could not deal with my anxiety and ended up drinking yesterday. I feel bad today because I drank so much.  I was really feeling bad so I called my Aunt. She told me to come back to the psychiatric hospital to get back on the medications. I feel safer now and am not having the thoughts to kill myself anymore. I feel like I could leave tomorrow after I get my medications. I would like to have another appointment at the [facility]. This time I will make it a priority to go." His current urine drug screen is negative along with alcohol level less than five. Justin Fisher was pleasant during the interview today and showed insight into what led him to come back so soon after discharge from the unit. He appears motivated to make his mental health a priority and reports his medications were really helping him. Patient reports he has been living in his car but that his mother has been trying to help him find more stable housing.   Pt spent the night in the OBS UNIT without incident. Today, on 10/16/2015 he was seen again and is doing very well as mentioned above.    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 How often do you have a drink containing alcohol?: Monthly or less 2. How many drinks containing alcohol do you have on a typical day when you are drinking?: 10 or more 3. How often do you have six or  more drinks on one occasion?: Monthly Preliminary Score: 6 4. How often during the last year have you found that you were not able to stop drinking once you had started?: Monthly 5. How often during the last year have you failed to do what was normally expected from you becasue of drinking?: Monthly 6. How often  during the last year have you needed a first drink in the morning to get yourself going after a heavy drinking session?: Monthly 7. How often during the last year have you had a feeling of guilt of remorse after drinking?: Monthly 8. How often during the last year have you been unable to remember what happened the night before because you had been drinking?: Monthly 9. Have you or someone else been injured as a result of your drinking?: No 10. Has a relative or friend or a doctor or another health worker been concerned about your drinking or suggested you cut down?: Yes, during the last year Alcohol Use Disorder Identification Test Final Score (AUDIT): 21 Brief Intervention: Yes  Substance Abuse History in the last 12 months:  Yes.    Consequences of Substance Abuse: Medical Consequences:  Liver damage, Possible death by overdose Legal Consequences:  Arrests, jail time, Loss of driving privilege. Family Consequences:  Family discord, divorce and or separation.  Previous Psychotropic Medications: Yes (Lithium Carbonate), Effexor, Neurontin   Psychological Evaluations: Yes   Past Medical History:  Past Medical History  Diagnosis Date  . Anxiety    History reviewed. No pertinent past surgical history.  Family History: History reviewed. No pertinent family history.  Tobacco Screening: Current smoker requesting nicotine gum   Social History:  History  Alcohol Use  . Yes     History  Drug Use No    Additional Social History:  Allergies:  No Known Allergies  Lab Results:  Results for orders placed or performed during the hospital encounter of 10/15/15 (from the past 48 hour(s))  Comprehensive metabolic panel     Status: Abnormal   Collection Time: 10/15/15 11:27 AM  Result Value Ref Range   Sodium 140 135 - 145 mmol/L   Potassium 3.9 3.5 - 5.1 mmol/L   Chloride 104 101 - 111 mmol/L   CO2 23 22 - 32 mmol/L   Glucose, Bld 138 (H) 65 - 99 mg/dL   BUN 17 6 - 20 mg/dL    Creatinine, Ser 0.95 0.61 - 1.24 mg/dL   Calcium 9.3 8.9 - 10.3 mg/dL   Total Protein 7.9 6.5 - 8.1 g/dL   Albumin 4.8 3.5 - 5.0 g/dL   AST 25 15 - 41 U/L   ALT 24 17 - 63 U/L   Alkaline Phosphatase 86 38 - 126 U/L   Total Bilirubin 0.3 0.3 - 1.2 mg/dL   GFR calc non Af Amer >60 >60 mL/min   GFR calc Af Amer >60 >60 mL/min    Comment: (NOTE) The eGFR has been calculated using the CKD EPI equation. This calculation has not been validated in all clinical situations. eGFR's persistently <60 mL/min signify possible Chronic Kidney Disease.    Anion gap 13 5 - 15  Ethanol (ETOH)     Status: None   Collection Time: 10/15/15 11:27 AM  Result Value Ref Range   Alcohol, Ethyl (B) <5 <5 mg/dL    Comment:        LOWEST DETECTABLE LIMIT FOR SERUM ALCOHOL IS 5 mg/dL FOR MEDICAL PURPOSES ONLY   CBC     Status: Abnormal  Collection Time: 10/15/15 11:27 AM  Result Value Ref Range   WBC 19.0 (H) 4.0 - 10.5 K/uL   RBC 5.45 4.22 - 5.81 MIL/uL   Hemoglobin 15.2 13.0 - 17.0 g/dL   HCT 45.2 39.0 - 52.0 %   MCV 82.9 78.0 - 100.0 fL   MCH 27.9 26.0 - 34.0 pg   MCHC 33.6 30.0 - 36.0 g/dL   RDW 14.2 11.5 - 15.5 %   Platelets 402 (H) 150 - 400 K/uL  Urine rapid drug screen (hosp performed) (Not at [facility])     Status: None   Collection Time: 10/15/15 12:20 PM  Result Value Ref Range   Opiates NONE DETECTED NONE DETECTED   Cocaine NONE DETECTED NONE DETECTED   Benzodiazepines NONE DETECTED NONE DETECTED   Amphetamines NONE DETECTED NONE DETECTED   Tetrahydrocannabinol NONE DETECTED NONE DETECTED   Barbiturates NONE DETECTED NONE DETECTED    Comment:        DRUG SCREEN FOR MEDICAL PURPOSES ONLY.  IF CONFIRMATION IS NEEDED FOR ANY PURPOSE, NOTIFY LAB WITHIN 5 DAYS.        LOWEST DETECTABLE LIMITS FOR URINE DRUG SCREEN Drug Class       Cutoff (ng/mL) Amphetamine      1000 Barbiturate      200 Benzodiazepine   001 Tricyclics       749 Opiates          300 Cocaine          300 THC               50    Blood Alcohol level:  Lab Results  Component Value Date   ETH <5 10/15/2015   ETH 78* 44/96/7591   Metabolic Disorder Labs:  Lab Results  Component Value Date   HGBA1C 6.5* 09/12/2015   MPG 140 09/12/2015   No results found for: PROLACTIN Lab Results  Component Value Date   CHOL 215* 09/12/2015   TRIG 275* 09/12/2015   HDL 32* 09/12/2015   CHOLHDL 6.7 09/12/2015   VLDL 55* 09/12/2015   LDLCALC 128* 09/12/2015    Current Medications: Current Facility-Administered Medications  Medication Dose Route Frequency Provider Last Rate Last Dose  . acetaminophen (TYLENOL) tablet 650 mg  650 mg Oral Q6H PRN Niel Hummer, NP      . alum & mag hydroxide-simeth (MAALOX/MYLANTA) 200-200-20 MG/5ML suspension 30 mL  30 mL Oral Q4H PRN Niel Hummer, NP      . chlordiazePOXIDE (LIBRIUM) capsule 25 mg  25 mg Oral Q6H PRN Niel Hummer, NP      . gabapentin (NEURONTIN) capsule 800 mg  800 mg Oral TID WC & HS Niel Hummer, NP   800 mg at 10/16/15 1201  . hydrOXYzine (ATARAX/VISTARIL) tablet 50 mg  50 mg Oral Q6H PRN Niel Hummer, NP   50 mg at 10/16/15 1000  . lamoTRIgine (LAMICTAL) tablet 100 mg  100 mg Oral Daily Niel Hummer, NP   100 mg at 10/16/15 0859  . magnesium hydroxide (MILK OF MAGNESIA) suspension 30 mL  30 mL Oral Daily PRN Niel Hummer, NP      . nicotine polacrilex (NICORETTE) gum 2 mg  2 mg Oral PRN Niel Hummer, NP   2 mg at 10/16/15 1258  . sertraline (ZOLOFT) tablet 200 mg  200 mg Oral Daily Niel Hummer, NP   200 mg at 10/16/15 0859  . traZODone (DESYREL) tablet 100 mg  100 mg  Oral QHS PRN Niel Hummer, NP   100 mg at 10/15/15 2122   PTA Medications: Prescriptions prior to admission  Medication Sig Dispense Refill Last Dose  . gabapentin (NEURONTIN) 800 MG tablet Take 1 tablet (800 mg total) by mouth 4 (four) times daily - after meals and at bedtime. For agitation (Patient taking differently: Take 800 mg by mouth 4 (four) times daily. For agitation) 120  tablet 0 10/11/2015  . hydrOXYzine (ATARAX/VISTARIL) 25 MG tablet Take 1 tablet (25 mg) three times daily & 2 tablets (50 mg at bedtime: For anxiety/insomnia (Patient taking differently: Take 50 mg by mouth every 6 (six) hours as needed for anxiety. ) 90 tablet 0 10/13/2015  . ibuprofen (ADVIL,MOTRIN) 200 MG tablet Take 800 mg by mouth every 6 (six) hours as needed for moderate pain.   Past Month at Unknown time  . lamoTRIgine (LAMICTAL) 100 MG tablet Take 1 tablet (100 mg total) by mouth daily. For mood stabilization 30 tablet 0 10/14/2015 at Unknown time  . naproxen (NAPROSYN) 500 MG tablet Take 1 tablet (500 mg total) by mouth 2 (two) times daily. For moderate pain 1 tablet 0 10/14/2015 at Unknown time  . sertraline (ZOLOFT) 100 MG tablet Take 2 tablets (200 mg total) by mouth daily. For depression 30 tablet 0 10/14/2015 at Unknown time  . tetrahydrozoline (VISINE) 0.05 % ophthalmic solution Place 2 drops into both eyes 2 (two) times daily as needed.   Past Week at Unknown time  . traZODone (DESYREL) 100 MG tablet Take 2 tablets (200 mg total) by mouth at bedtime. For sleep 60 tablet 0 10/14/2015 at Unknown time  . nicotine polacrilex (NICORETTE) 2 MG gum Take 1 each (2 mg total) by mouth as needed for smoking cessation. (Patient not taking: Reported on 10/15/2015) 100 tablet 0    Musculoskeletal: Strength & Muscle Tone: within normal limits Gait & Station: normal Patient leans: N/A  Psychiatric Specialty Exam: Physical Exam  Genitourinary:  Denies any issues in this area  Psychiatric: His speech is normal. His mood appears anxious. His affect is angry and inappropriate. His affect is not blunt and not labile. He is agitated and aggressive. Cognition and memory are normal. He expresses impulsivity and inappropriate judgment. He exhibits a depressed mood.    Review of Systems  Constitutional: Negative.   HENT: Negative.   Eyes: Negative.   Respiratory: Negative.   Cardiovascular: Negative.    Gastrointestinal: Negative.   Genitourinary: Negative.   Musculoskeletal: Negative.   Skin: Negative.   Neurological: Negative.   Endo/Heme/Allergies: Negative.   Psychiatric/Behavioral: Positive for depression and substance abuse (Alcoholism,). Negative for suicidal ideas, hallucinations and memory loss. The patient is nervous/anxious and has insomnia.     Blood pressure 124/60, pulse 86, temperature 98.2 F (36.8 C), temperature source Oral, resp. rate 16, height 5' 7"  (1.702 m), weight 103.42 kg (228 lb), SpO2 99 %.Body mass index is 35.7 kg/(m^2).  General Appearance: Casual and Fairly Groomed  Engineer, water::  Good  Speech:  Clear and Coherent  Volume:  Normal  Mood:  Anxious  Affect:  Congruent  Thought Process:  Coherent and Goal Directed  Orientation:  Full (Time, Place, and Person)  Thought Content:  discharge plans, followup  Suicidal Thoughts:  No  Homicidal Thoughts:  No  Memory:  Immediate;   Good Recent;   Good Remote;   Good  Judgement:  Fair yet improving  Insight:  Fair yet improving  Psychomotor Activity:  Normal  Concentration:  Good  Recall:  Roel Cluck of Knowledge:Fair  Language: Good  Akathisia:  No  Handed:  Right  AIMS (if indicated):     Assets:  Communication Skills Desire for Improvement Financial Resources/Insurance Leisure Time Physical Health Resilience Social Support Vocational/Educational  ADL's:  Intact  Cognition: WNL  Sleep:      Treatment Plan/Recommendations: Alcohol-induced depressive disorder with moderate or severe use disorder with onset during intoxication ([place]), improving, stable for outpatient management 14 day RX for scripts for pertinent psychotropics  Disposition: -Discharge home with outpatient resources for followup   Benjamine Mola, FNP 4/12/20172:23 PM  Agree with NP note as above

## 2015-10-30 ENCOUNTER — Emergency Department (HOSPITAL_COMMUNITY): Payer: BLUE CROSS/BLUE SHIELD

## 2015-10-30 ENCOUNTER — Emergency Department (HOSPITAL_COMMUNITY)
Admission: EM | Admit: 2015-10-30 | Discharge: 2015-10-30 | Disposition: A | Discharge status: Home / Self Care | Payer: BLUE CROSS/BLUE SHIELD | Attending: Emergency Medicine | Admitting: Emergency Medicine

## 2015-10-30 ENCOUNTER — Encounter (HOSPITAL_COMMUNITY): Payer: Self-pay | Admitting: Emergency Medicine

## 2015-10-30 DIAGNOSIS — S299XXA Unspecified injury of thorax, initial encounter: Secondary | ICD-10-CM | POA: Diagnosis not present

## 2015-10-30 DIAGNOSIS — Y998 Other external cause status: Secondary | ICD-10-CM | POA: Diagnosis not present

## 2015-10-30 DIAGNOSIS — Z79899 Other long term (current) drug therapy: Secondary | ICD-10-CM | POA: Insufficient documentation

## 2015-10-30 DIAGNOSIS — S3991XA Unspecified injury of abdomen, initial encounter: Secondary | ICD-10-CM | POA: Diagnosis not present

## 2015-10-30 DIAGNOSIS — F419 Anxiety disorder, unspecified: Secondary | ICD-10-CM | POA: Insufficient documentation

## 2015-10-30 DIAGNOSIS — Y9389 Activity, other specified: Secondary | ICD-10-CM | POA: Insufficient documentation

## 2015-10-30 DIAGNOSIS — Y92410 Unspecified street and highway as the place of occurrence of the external cause: Secondary | ICD-10-CM | POA: Insufficient documentation

## 2015-10-30 DIAGNOSIS — F17200 Nicotine dependence, unspecified, uncomplicated: Secondary | ICD-10-CM | POA: Insufficient documentation

## 2015-10-30 DIAGNOSIS — S30811A Abrasion of abdominal wall, initial encounter: Secondary | ICD-10-CM | POA: Diagnosis not present

## 2015-10-30 DIAGNOSIS — S0101XA Laceration without foreign body of scalp, initial encounter: Secondary | ICD-10-CM | POA: Insufficient documentation

## 2015-10-30 DIAGNOSIS — S0990XA Unspecified injury of head, initial encounter: Secondary | ICD-10-CM | POA: Diagnosis present

## 2015-10-30 DIAGNOSIS — S20211A Contusion of right front wall of thorax, initial encounter: Secondary | ICD-10-CM | POA: Diagnosis not present

## 2015-10-30 LAB — COMPREHENSIVE METABOLIC PANEL
ALBUMIN: 4 g/dL (ref 3.5–5.0)
ALK PHOS: 84 U/L (ref 38–126)
ALT: 28 U/L (ref 17–63)
AST: 31 U/L (ref 15–41)
Anion gap: 12 (ref 5–15)
BILIRUBIN TOTAL: 0.3 mg/dL (ref 0.3–1.2)
BUN: 9 mg/dL (ref 6–20)
CALCIUM: 9.2 mg/dL (ref 8.9–10.3)
CO2: 20 mmol/L — AB (ref 22–32)
CREATININE: 0.86 mg/dL (ref 0.61–1.24)
Chloride: 109 mmol/L (ref 101–111)
GFR calc Af Amer: >60 mL/min (ref >60)
GFR calc non Af Amer: >60 mL/min (ref >60)
GLUCOSE: 99 mg/dL (ref 65–99)
Potassium: 4 mmol/L (ref 3.5–5.1)
SODIUM: 141 mmol/L (ref 135–145)
Total Protein: 7 g/dL (ref 6.5–8.1)

## 2015-10-30 LAB — CBC WITH DIFFERENTIAL/PLATELET
BASOS PCT: 0 %
Basophils Absolute: 0 10*3/uL (ref 0.0–0.1)
EOS ABS: 0.1 10*3/uL (ref 0.0–0.7)
Eosinophils Relative: 1 %
HEMATOCRIT: 42.4 % (ref 39.0–52.0)
Hemoglobin: 14.4 g/dL (ref 13.0–17.0)
Lymphocytes Relative: 18 %
Lymphs Abs: 2.1 10*3/uL (ref 0.7–4.0)
MCH: 28.1 pg (ref 26.0–34.0)
MCHC: 34 g/dL (ref 30.0–36.0)
MCV: 82.8 fL (ref 78.0–100.0)
MONOS PCT: 6 %
Monocytes Absolute: 0.7 10*3/uL (ref 0.1–1.0)
NEUTROS ABS: 8.4 10*3/uL — AB (ref 1.7–7.7)
Neutrophils Relative %: 75 %
Platelets: 339 10*3/uL (ref 150–400)
RBC: 5.12 MIL/uL (ref 4.22–5.81)
RDW: 14.2 % (ref 11.5–15.5)
WBC: 11.3 10*3/uL — ABNORMAL HIGH (ref 4.0–10.5)

## 2015-10-30 LAB — LIPASE, BLOOD: Lipase: 24 U/L (ref 11–51)

## 2015-10-30 MED ORDER — IOPAMIDOL (ISOVUE-300) INJECTION 61%
INTRAVENOUS | Status: AC
  Administered 2015-10-30: 100 mL
  Filled 2015-10-30: qty 100

## 2015-10-30 MED ORDER — FENTANYL CITRATE (PF) 100 MCG/2ML IJ SOLN
50.0000 ug | Freq: Once | INTRAMUSCULAR | Status: AC
  Administered 2015-10-30: 50 ug via INTRAVENOUS
  Filled 2015-10-30: qty 2

## 2015-10-30 MED ORDER — HYDROCODONE-ACETAMINOPHEN 5-325 MG PO TABS: 1.0000 | ORAL_TABLET | Freq: Four times a day (QID) | ORAL | Status: DC | PRN

## 2015-10-30 MED ORDER — ONDANSETRON HCL 4 MG/2ML IJ SOLN
4.0000 mg | Freq: Once | INTRAMUSCULAR | Status: AC
  Administered 2015-10-30: 4 mg via INTRAVENOUS
  Filled 2015-10-30: qty 2
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
## 2015-10-30 NOTE — ED Notes (Signed)
MD at bedside. 

## 2016-01-01 ENCOUNTER — Encounter (HOSPITAL_COMMUNITY): Payer: Self-pay | Admitting: Emergency Medicine

## 2016-01-01 ENCOUNTER — Observation Stay (HOSPITAL_COMMUNITY)
Admission: AD | Admit: 2016-01-01 | Discharge: 2016-01-02 | Disposition: A | Discharge status: Home / Self Care | Payer: BLUE CROSS/BLUE SHIELD | Source: Intra-hospital | Attending: Psychiatry | Admitting: Psychiatry

## 2016-01-01 ENCOUNTER — Encounter (HOSPITAL_COMMUNITY): Payer: Self-pay | Admitting: *Deleted

## 2016-01-01 ENCOUNTER — Emergency Department (HOSPITAL_COMMUNITY)
Admission: EM | Admit: 2016-01-01 | Discharge: 2016-01-01 | Disposition: A | Discharge status: Other Acute Inpatient Hospital | Payer: BLUE CROSS/BLUE SHIELD | Attending: Emergency Medicine | Admitting: Emergency Medicine

## 2016-01-01 DIAGNOSIS — F1920 Other psychoactive substance dependence, uncomplicated: Secondary | ICD-10-CM | POA: Insufficient documentation

## 2016-01-01 DIAGNOSIS — F1020 Alcohol dependence, uncomplicated: Secondary | ICD-10-CM | POA: Insufficient documentation

## 2016-01-01 DIAGNOSIS — F1110 Opioid abuse, uncomplicated: Secondary | ICD-10-CM | POA: Diagnosis not present

## 2016-01-01 DIAGNOSIS — F332 Major depressive disorder, recurrent severe without psychotic features: Principal | ICD-10-CM | POA: Diagnosis present

## 2016-01-01 DIAGNOSIS — R45851 Suicidal ideations: Secondary | ICD-10-CM | POA: Insufficient documentation

## 2016-01-01 DIAGNOSIS — F17200 Nicotine dependence, unspecified, uncomplicated: Secondary | ICD-10-CM | POA: Insufficient documentation

## 2016-01-01 DIAGNOSIS — F411 Generalized anxiety disorder: Secondary | ICD-10-CM | POA: Insufficient documentation

## 2016-01-01 DIAGNOSIS — F3289 Other specified depressive episodes: Secondary | ICD-10-CM

## 2016-01-01 DIAGNOSIS — F1024 Alcohol dependence with alcohol-induced mood disorder: Secondary | ICD-10-CM

## 2016-01-01 DIAGNOSIS — Z79899 Other long term (current) drug therapy: Secondary | ICD-10-CM | POA: Insufficient documentation

## 2016-01-01 DIAGNOSIS — F1120 Opioid dependence, uncomplicated: Secondary | ICD-10-CM | POA: Diagnosis not present

## 2016-01-01 DIAGNOSIS — F10229 Alcohol dependence with intoxication, unspecified: Secondary | ICD-10-CM | POA: Diagnosis present

## 2016-01-01 DIAGNOSIS — F17210 Nicotine dependence, cigarettes, uncomplicated: Secondary | ICD-10-CM | POA: Insufficient documentation

## 2016-01-01 DIAGNOSIS — F329 Major depressive disorder, single episode, unspecified: Secondary | ICD-10-CM | POA: Insufficient documentation

## 2016-01-01 DIAGNOSIS — F32A Depression, unspecified: Secondary | ICD-10-CM

## 2016-01-01 HISTORY — DX: Suicidal ideations: R45.851

## 2016-01-01 LAB — ACETAMINOPHEN LEVEL

## 2016-01-01 LAB — ETHANOL

## 2016-01-01 LAB — CBC
HEMATOCRIT: 47 % (ref 39.0–52.0)
Hemoglobin: 16.8 g/dL (ref 13.0–17.0)
MCH: 28.3 pg (ref 26.0–34.0)
MCHC: 35.7 g/dL (ref 30.0–36.0)
MCV: 79.3 fL (ref 78.0–100.0)
PLATELETS: 407 10*3/uL — AB (ref 150–400)
RBC: 5.93 MIL/uL — ABNORMAL HIGH (ref 4.22–5.81)
RDW: 13.7 % (ref 11.5–15.5)
WBC: 10.3 10*3/uL (ref 4.0–10.5)

## 2016-01-01 LAB — COMPREHENSIVE METABOLIC PANEL
ALBUMIN: 5 g/dL (ref 3.5–5.0)
ALT: 30 U/L (ref 17–63)
ANION GAP: 10 (ref 5–15)
AST: 25 U/L (ref 15–41)
Alkaline Phosphatase: 112 U/L (ref 38–126)
BILIRUBIN TOTAL: 0.7 mg/dL (ref 0.3–1.2)
BUN: 11 mg/dL (ref 6–20)
CO2: 24 mmol/L (ref 22–32)
Calcium: 9.8 mg/dL (ref 8.9–10.3)
Chloride: 106 mmol/L (ref 101–111)
Creatinine, Ser: 0.82 mg/dL (ref 0.61–1.24)
GFR calc Af Amer: >60 mL/min (ref >60)
GFR calc non Af Amer: >60 mL/min (ref >60)
GLUCOSE: 121 mg/dL — AB (ref 65–99)
POTASSIUM: 4 mmol/L (ref 3.5–5.1)
Sodium: 140 mmol/L (ref 135–145)
TOTAL PROTEIN: 8.5 g/dL — AB (ref 6.5–8.1)

## 2016-01-01 LAB — SALICYLATE LEVEL: Salicylate Lvl: <4 mg/dL (ref 2.8–30.0)

## 2016-01-01 LAB — RAPID URINE DRUG SCREEN, HOSP PERFORMED
Amphetamines: NOT DETECTED
BENZODIAZEPINES: NOT DETECTED
Barbiturates: NOT DETECTED
COCAINE: NOT DETECTED
Opiates: POSITIVE — AB
Tetrahydrocannabinol: NOT DETECTED

## 2016-01-01 MED ORDER — SERTRALINE HCL 50 MG PO TABS
200.0000 mg | ORAL_TABLET | Freq: Every day | ORAL | Status: DC
  Filled 2016-01-01: qty 4

## 2016-01-01 MED ORDER — HYDROXYZINE PAMOATE 50 MG PO CAPS
50.0000 mg | ORAL_CAPSULE | Freq: Four times a day (QID) | ORAL | Status: DC | PRN
  Filled 2016-01-01: qty 1

## 2016-01-01 MED ORDER — HYDROXYZINE HCL 25 MG PO TABS: 25.0000 mg | ORAL_TABLET | Freq: Four times a day (QID) | ORAL | Status: DC | PRN

## 2016-01-01 MED ORDER — LOPERAMIDE HCL 2 MG PO CAPS
2.0000 mg | ORAL_CAPSULE | ORAL | Status: DC | PRN
Start: 2016-01-01 — End: 2016-01-02

## 2016-01-01 MED ORDER — NAPROXEN 500 MG PO TABS: 500.0000 mg | ORAL_TABLET | Freq: Two times a day (BID) | ORAL | Status: DC | PRN

## 2016-01-01 MED ORDER — LAMOTRIGINE 100 MG PO TABS: 100.0000 mg | ORAL_TABLET | Freq: Every day | ORAL | Status: DC

## 2016-01-01 MED ORDER — CLONIDINE HCL 0.1 MG PO TABS: 0.1000 mg | ORAL_TABLET | Freq: Every day | ORAL | Status: DC

## 2016-01-01 MED ORDER — ONDANSETRON 4 MG PO TBDP: 4.0000 mg | ORAL_TABLET | Freq: Four times a day (QID) | ORAL | Status: DC | PRN

## 2016-01-01 MED ORDER — METHOCARBAMOL 500 MG PO TABS: 500.0000 mg | ORAL_TABLET | Freq: Three times a day (TID) | ORAL | Status: DC | PRN

## 2016-01-01 MED ORDER — TRAZODONE HCL 100 MG PO TABS
100.0000 mg | ORAL_TABLET | Freq: Every evening | ORAL | Status: DC | PRN
  Administered 2016-01-01: 100 mg via ORAL
  Filled 2016-01-01: qty 1

## 2016-01-01 MED ORDER — GABAPENTIN 400 MG PO CAPS
400.0000 mg | ORAL_CAPSULE | Freq: Three times a day (TID) | ORAL | Status: DC
  Administered 2016-01-01 – 2016-01-02 (×4): 400 mg via ORAL
  Filled 2016-01-01 (×4): qty 1

## 2016-01-01 MED ORDER — CLONIDINE HCL 0.1 MG PO TABS: 0.1000 mg | ORAL_TABLET | ORAL | Status: DC

## 2016-01-01 MED ORDER — GABAPENTIN 800 MG PO TABS
400.0000 mg | ORAL_TABLET | Freq: Three times a day (TID) | ORAL | Status: DC
  Filled 2016-01-01: qty 0.5

## 2016-01-01 MED ORDER — ALUM & MAG HYDROXIDE-SIMETH 200-200-20 MG/5ML PO SUSP: 30.0000 mL | ORAL | Status: DC | PRN

## 2016-01-01 MED ORDER — DICYCLOMINE HCL 20 MG PO TABS: 20.0000 mg | ORAL_TABLET | Freq: Four times a day (QID) | ORAL | Status: DC | PRN

## 2016-01-01 MED ORDER — GABAPENTIN 400 MG PO CAPS: 400.0000 mg | ORAL_CAPSULE | Freq: Three times a day (TID) | ORAL | Status: DC

## 2016-01-01 MED ORDER — CLONIDINE HCL 0.1 MG PO TABS
0.1000 mg | ORAL_TABLET | Freq: Four times a day (QID) | ORAL | Status: DC
  Administered 2016-01-01 – 2016-01-02 (×4): 0.1 mg via ORAL
  Filled 2016-01-01 (×4): qty 1

## 2016-01-01 MED ORDER — LOPERAMIDE HCL 2 MG PO CAPS: 2.0000 mg | ORAL_CAPSULE | ORAL | Status: DC | PRN

## 2016-01-01 MED ORDER — TRAZODONE HCL 100 MG PO TABS: 200.0000 mg | ORAL_TABLET | Freq: Every day | ORAL | Status: DC

## 2016-01-01 MED ORDER — HYDROXYZINE HCL 25 MG PO TABS
25.0000 mg | ORAL_TABLET | Freq: Four times a day (QID) | ORAL | Status: DC | PRN
  Administered 2016-01-01 – 2016-01-02 (×2): 25 mg via ORAL
  Filled 2016-01-01 (×2): qty 1

## 2016-01-01 MED ORDER — ACETAMINOPHEN 325 MG PO TABS: 650.0000 mg | ORAL_TABLET | Freq: Four times a day (QID) | ORAL | Status: DC | PRN

## 2016-01-01 MED ORDER — CLONIDINE HCL 0.1 MG PO TABS
0.1000 mg | ORAL_TABLET | Freq: Four times a day (QID) | ORAL | Status: DC
  Administered 2016-01-01: 0.1 mg via ORAL
  Filled 2016-01-01: qty 1

## 2016-01-01 MED ORDER — MAGNESIUM HYDROXIDE 400 MG/5ML PO SUSP: 30.0000 mL | Freq: Every day | ORAL | Status: DC | PRN

## 2016-01-01 MED ORDER — GABAPENTIN 800 MG PO TABS
800.0000 mg | ORAL_TABLET | Freq: Three times a day (TID) | ORAL | Status: DC
  Filled 2016-01-01: qty 1

## 2016-01-01 NOTE — ED Notes (Signed)
Pt has in belonging bag:  Black book bag, brown sandals, grey t-shirt, black sunglasses, green lighter, cig box, black phone.

## 2016-01-01 NOTE — [facility] Assessment (Signed)
[facility] Assessment Progress Note  Per Claudette Headonrad Withrow, DNP, this pt [facility] at this [facility].  Berneice Heinrichina Tate, RN, [facility]; they will be ready to receive pt at 15:00.  Pt has signed Voluntary Admission and Consent for Treatment, as well as Consent to Release Information to no one, and signed [facility].  Pt's nurse, Morrie Sheldonshley, has been notified, and agrees to send original paperwork along with pt via [facility], and to call report to [phone] or [phone].  Doylene Canninghomas Kennidee Heyne, MA Triage Specialist [phone]

## 2016-01-01 NOTE — ED Notes (Signed)
Pt admitted to room #37. Pt behavior calm and cooperative, pleasant on approach. Pt reports he came to the hospital for "getting off heroin and suicidal thoughts." Pt endorsing SI without plan. Denies HI. Denies AVH. 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 Pt reports he lives alone and is employed. Pt denies any recent stressors. Pt reports he has had rehab for heroin a couple of times in the past. Special checks q 15 mins in place for safety. Video monitoring in place.

## 2016-01-01 NOTE — Progress Notes (Addendum)
Pt with sitter at bedside [facility] ans [facility]  Pt with arms crossed over his chest and feet crossed, feet/legs in constant motion Pt answered questions pleasant without aggression Pt confirms no pcp and did not follow up with providers after [facility] except made a visit back to [place] (where "my mother lives" "lived with my mother for half a year but I stay here in ") to see his psychiatrist there "doctor Riley [facility] at [facility]."  Pt with father as emergency contact not a mother listed in [employer]  [place] [address], [address] [place], [place] [address]  Phone [phone] Fax [phone]  "She wants me to find a psychiatrist here but did not recommend one."  Pt states he had the resources provided to him after leaving [facility] but did not follow up with any of the resources or providers given  CM provided pt with a 6 page list of internal medicine pcps for [employer]BS PPO (YPJ) plan and a 10 page list of psychiatrists in [place]  Placed per pt permission in his pt belonging bag at triage nursing station Pt aware and appreciative of providers to assist with f/u care  Entered in d/c instructions Please use the resources provided to you in emergency room by case manager to assist you're your choice of doctor for follow up Schedule an appointment as soon as possible for a visit on 01/03/2016 As needed The resources have been placed in your patient belongin bag as discussed these lists are providing you with internal medicine and psychiatrist/counselor within [facility] - accepting new patients at this time Please go to [email or web address], locate find a doctor area in top right corner of page to find others

## 2016-01-01 NOTE — Progress Notes (Signed)
Patient ID: Justin McardleDavid Vining, male   DOB: [birth year], 30 y.o.   MRN: [id number] Placed TC to Renata Capriceonrad NP to discuss protocol for patient and Neurontin dosing. Conrad to put in orders for Clonidine protocol and for Neurontin.

## 2016-01-01 NOTE — BH Assessment (Addendum)
Assessment Note  Justin Fisher is an 30 y.o. male with history of Anxiety and Suicidal Ideations. Patient presents to [facility] requesting detox from Heroin. Patient stared using Heroin during his early 20's. 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 Patient has used Heroin consistently over the past 1.5 month. He reports using .5 daily and last used yesterday. UDS + for O Patient asked about abusing any additional drugs (prescription and illicit). Sts, "Not really". He does admit consuming alcohol every other day. He drinks 6 to 8 beers per use. Last drinks was 3 to 4 days ago. Patient's BAL is negative. Several withdrawal symptoms reported: nausea, diarrhea, increased anxiety, flu like symptoms, chills, hot flashes and sweats. No history of seizures. Patient does have a history of black outs. Patient has participated in INPT treatment for substance use at [facility] In[facility] unit and Obs unit. He has also received treatment at [facility] 07/2015 and [facility] 3x's.   Patient has suicidal ideations. No plan or intent. He has a previous history of cutting wrist and eating rat poison. Trigger for previous attempt was substance abuse. Today's trigger for the current suicidal thoughts are also related to his substance abuse and withdrawal symptoms. Denies self mutilating behaviors. No family history of mental health illness or substance use. Patient has depressive symptoms including: loss of interest pleasures, fatigue, and hopelessness.   No history of abuse. No support systems. No HI. Calm and cooperative. No history of violent or aggressive behaviors. No legal issues. No AVH's. Patient does not appear to be responding to internal stimuli.      Diagnosis: Major Depressive Disorder, Recurrent, Severe, without psychotic symptoms; Anxiety Disorder, Substance Induced Mood Disorder, Substance Use Disorder  Past Medical History:  Past Medical History  Diagnosis Date  . Anxiety   . Suicidal  thoughts     History reviewed. No pertinent past surgical history.  Family History: No family history on file.  Social History:  reports that he has been smoking.  He does not have any smokeless tobacco history on file. He reports that he drinks alcohol. He reports that he does not use illicit drugs.  Additional Social History:  Alcohol / Drug Use Pain Medications: SEE MAR Prescriptions: SEE MAR Over the Counter: SEE MAR History of alcohol / drug use?: Yes Longest period of sobriety (when/how long): Unable to remember  Negative Consequences of Use: Financial, Personal relationships, Work / School Withdrawal Symptoms: Agitation, Sweats, Fever / Chills, Irritability, Nausea / Vomiting, Weakness Substance #1 Name of Substance 1: Heroin (Patient was injected in his antecubital areas) 1 - Age of First Use: "early 20's" 1 - Amount (size/oz): 1.5 months 1 - Frequency: daily for the past 1.5 month;  1 - Duration: on-going  1 - Last Use / Amount: 12/31/2015 Substance #2 Name of Substance 2: Alcohol  2 - Age of First Use: 30 yrs old  2 - Amount (size/oz): 6 to 8 beers 2 - Frequency: daily  2 - Duration: on-going  2 - Last Use / Amount: 3-4 days ago   CIWA: CIWA-Ar BP: 135/66 mmHg Pulse Rate: 72 Nausea and Vomiting: intermittent nausea with dry heaves COWS:    Allergies: No Known Allergies  Home Medications:  (Not in a hospital admission)  OB/GYN Status:  No LMP for male patient.  General Assessment Data [facility] of [facility]: [facility] Assessment: In system Is this a Tele or Face-to-Face Assessment?: Face-to-Face Is this an Initial Assessment or a Re-assessment for this encounter?:  Initial Assessment Marital status: Single Maiden name:  (n/a) Is patient pregnant?: No Pregnancy Status: No Living Arrangements: Alone Can pt return to current living arrangement?: Yes Admission Status: Voluntary Is patient capable of signing voluntary admission?: Yes Referral Source:  Self/Family/Friend Insurance type:  [employer](BCBS)     Crisis Care Plan Living Arrangements: Alone Legal Guardian: Other: (no legal guardian ) Name of Psychiatrist:  (no psychiatrist ) Name of Therapist:  (no therapist )  Education Status Is patient currently in school?: No Current Grade:  (n/a) Highest grade of school patient has completed:  (HS) Name of school:  (n/a) Contact person:  (n/a)  Risk to self with the past 6 months Suicidal Ideation: Yes-Currently Present Has patient been a risk to self within the past 6 months prior to admission? : Yes Suicidal Intent: No Has patient had any suicidal intent within the past 6 months prior to admission? : No Is patient at risk for suicide?: No Suicidal Plan?: No Has patient had any suicidal plan within the past 6 months prior to admission? : No Access to Means: No What has been your use of drugs/alcohol within the last 12 months?:  (patient reports Heroin and Alcohol use ) Previous Attempts/Gestures: Yes How many times?:  (3x's-eat rat poison and cut self) Other Self Harm Risks:  (denies ) Triggers for Past Attempts: Other (Comment) (drug use ) Intentional Self Injurious Behavior: None Family Suicide History: No Recent stressful life event(s): Other (Comment) (drug use) Persecutory voices/beliefs?: No Depression: Yes Depression Symptoms: Feeling angry/irritable, Feeling worthless/self pity, Loss of interest in usual pleasures, Fatigue, Guilt, Isolating, Insomnia, Tearfulness, Despondent Substance abuse history and/or treatment for substance abuse?: Yes  Risk to Others within the past 6 months Homicidal Ideation: No Does patient have any lifetime risk of violence toward others beyond the six months prior to admission? : No Thoughts of Harm to Others: No Current Homicidal Intent: No Current Homicidal Plan: No Access to Homicidal Means: No Identified Victim:  (n/a) History of harm to others?: No Assessment of Violence: None  Noted Violent Behavior Description:  (patient is calm and cooperative ) Does patient have access to weapons?: No Criminal Charges Pending?: No Does patient have a court date: No Is patient on probation?: No  Psychosis Hallucinations: None noted Delusions: None noted  Mental Status Report Appearance/Hygiene: In scrubs Eye Contact: Good Motor Activity: Freedom of movement Speech: Logical/coherent Level of Consciousness: Alert Mood: Depressed Affect: Appropriate to circumstance Anxiety Level: None Thought Processes: Relevant Judgement: Impaired Orientation: Person, Place, Time, Situation Obsessive Compulsive Thoughts/Behaviors: None  Cognitive Functioning Concentration: Decreased Memory: Recent Intact, Remote Intact IQ: Average Insight: Poor Impulse Control: Fair Appetite: Fair Weight Loss:  (patient denies ) Weight Gain:  (patient denies ) Sleep: No Change Total Hours of Sleep:  (varies (7 or 8 hrs)) Vegetative Symptoms: None  ADLScreening [facility] Assessment [facility]) Patient's cognitive ability adequate to safely complete daily activities?: Yes Patient able to express need for assistance with ADLs?: Yes Independently performs ADLs?: Yes (appropriate for developmental age)  Prior Inpatient Therapy Prior Inpatient Therapy: Yes Prior Therapy Dates:  (multiple admissions starting 2004 to 09/2015) Prior Therapy Facilty/Provider(s):  ([facility], [facility], [place] (3x's), [facility] ) Reason for Treatment:  (substance abuse)  Prior Outpatient Therapy Prior Outpatient Therapy: Yes Prior Therapy Dates:  (current ) Prior Therapy Facilty/Provider(s):  ([facility]. [facility] in [place], [place]iatrist ) Reason for Treatment:  (medication management ) Does patient have an ACCT team?: No Does patient have Intensive In-House Services?  :  No Does patient have [facility] services? : No Does patient have P4CC services?: No  ADL Screening (condition at time of admission) Patient's cognitive  ability adequate to safely complete daily activities?: Yes Is the patient deaf or have difficulty hearing?: No Does the patient have difficulty seeing, even when wearing glasses/contacts?: No Does the patient have difficulty concentrating, remembering, or making decisions?: No Patient able to express need for assistance with ADLs?: Yes Does the patient have difficulty dressing or bathing?: No Independently performs ADLs?: Yes (appropriate for developmental age) Communication: Independent Dressing (OT): Independent Grooming: Independent Feeding: Independent Bathing: Independent Toileting: Independent In/Out Bed: Independent Walks in Home: Independent Does the patient have difficulty walking or climbing stairs?: No Weakness of Legs: None Weakness of Arms/Hands: None  Home Assistive Devices/Equipment Home Assistive Devices/Equipment: None    Abuse/Neglect Assessment (Assessment to be complete while patient is alone) Physical Abuse: Denies Verbal Abuse: Denies Sexual Abuse: Denies Exploitation of patient/patient's resources: Denies Self-Neglect: Denies Values / Beliefs Cultural Requests During Hospitalization: None Spiritual Requests During Hospitalization: None   Advance Directives (For Healthcare) Does patient have an advance directive?: No Would patient like information on creating an advanced directive?: No - patient declined information Nutrition Screen- [facility] Patient's home diet: Regular  Additional Information 1:1 In Past 12 Months?: No CIRT Risk: No Elopement Risk: No Does patient have medical clearance?: Yes     Disposition:  Disposition Initial Assessment Completed for this Encounter: Yes Disposition of Patient: Inpatient treatment program ([facility] per Renata Capriceonrad, NP) Type of inpatient treatment program: Adult  On Site Evaluation by:   Reviewed with Physician:    Melynda Rippleerry, [facility] [facility] 01/01/2016 2:47 PM

## 2016-01-01 NOTE — H&P (Signed)
[facility] Unit Admission Assessment Adult  Patient Identification: Justin Fisher MRN:  [id number]  Date of Evaluation:  01/02/2016  Chief Complaint:  Patient stated "I need to come off heroin. I been on heroin for about the past 2 months. I still been taking my medicine. I just need help with detox, support, and meetings. I know what to do, just need support. "   Principal Diagnosis: MDD (major depressive disorder), recurrent severe, without psychosis ([place])  Diagnosis:   Patient Active Problem List   Diagnosis Date Noted  . Alcohol-induced depressive disorder with moderate or severe use disorder with onset during intoxication ([place]) [F10.24, F10.229, F32.89] 10/15/2015  . MDD (major depressive disorder), recurrent severe, without psychosis ([place]) [F33.2] 09/11/2015  . Alcohol use disorder, moderate, dependence ([place]) [F10.20] 09/11/2015   History of Present Illness:Justin Fisher is an 30 y.o. male with history of Anxiety and Suicidal Ideations. Patient presents to [facility] requesting detox from Heroin. Patient stared using Heroin during his early 20's. 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 Patient has used Heroin consistently over the past 1.5 month. He reports using .5 daily and last used yesterday. UDS + for O Patient asked about abusing any additional drugs (prescription and illicit). Sts, "Not really". He does admit consuming alcohol every other day. He drinks 6 to 8 beers per use. Last drinks was 3 to 4 days ago. Patient's BAL is negative. Several withdrawal symptoms reported: nausea, diarrhea, increased anxiety, flu like symptoms, chills, hot flashes and sweats. No history of seizures. Patient does have a history of black outs. Patient has participated in [place] treatment for substance use at [place] unit and Obs unit. He has also received treatment at [place] 07/2015 and [facility] 3x's.   Patient has suicidal ideations. No plan or intent. He has a  previous history of cutting wrist and eating rat poison. Trigger for previous attempt was substance abuse. Today's trigger for the current suicidal thoughts are also related to his substance abuse and withdrawal symptoms. Denies self mutilating behaviors. No family history of mental health illness or substance use. Patient has depressive symptoms including: loss of interest pleasures, fatigue, and hopelessness.   No history of abuse. No support systems. No HI. Calm and cooperative. No history of violent or aggressive behaviors. No legal issues. No AVH's. Patient does not appear to be responding to internal stimuli.   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 Yes.    Has the patient been a risk to self in the past 6 months? Yes.    Has the patient been a risk to self within the distant past? Yes Is the patient a risk to others? No Has the patient been a risk to others in the past 6 months? No Has the patient been a risk to others within the distant past? No  Prior Inpatient Therapy: Yes, [place], [place], [place], [place] Prior Outpatient Therapy: Yes but recently missed his appointment with the [facility]  Alcohol Screening: 1. How often do you have a drink containing alcohol?: 4 or more times a week 2. How many drinks containing alcohol do you have on a typical day when you are drinking?:  5 or 6 3. How often do you have six or more drinks on one occasion?: Weekly Preliminary Score: 5 4. How often during the last year have you found that you were not able to stop drinking once you had started?: Less than monthly 5. How often during the last year  have you failed to do what was normally expected from you becasue of drinking?: Weekly 6. How often during the last year have you needed a first drink in the morning to get yourself going after a heavy drinking session?: Never 7. How often during the last year have you had a feeling of guilt of remorse after drinking?: Daily or almost daily 8. How often during the last year have you been unable to remember what happened the night before because you had been drinking?: Weekly 9. Have you or someone else been injured as a result of your drinking?: No 10. Has a relative or friend or a doctor or another health worker been concerned about your drinking or suggested you cut down?: Yes, during the last year Alcohol Use Disorder Identification Test Final Score (AUDIT): 24 Brief Intervention: Patient declined brief intervention  Substance Abuse History in the last 12 months:  Yes.    Consequences of Substance Abuse: Medical Consequences:  Liver damage, Possible death by overdose Legal Consequences:  Arrests, jail time, Loss of driving privilege. Family Consequences:  Family discord, divorce and or separation.  Previous Psychotropic Medications: Yes (Lithium Carbonate), Effexor, Neurontin   Psychological Evaluations: Yes   Past Medical History:  Past Medical History  Diagnosis Date  . Anxiety   . Suicidal thoughts    History reviewed. No pertinent past surgical history.  Family History: History reviewed. No pertinent family history.  Tobacco Screening: Current smoker requesting nicotine gum   Social History:  History  Alcohol Use  . Yes     History  Drug Use No    Additional Social History:  Allergies:  No Known Allergies  Lab Results:  Results for orders placed or performed during the hospital encounter of 01/01/16 (from the past 48 hour(s))  Rapid urine drug screen (hospital performed)     Status: Abnormal   Collection Time: 01/01/16 12:44 PM  Result Value Ref Range   Opiates  POSITIVE (A) NONE DETECTED   Cocaine NONE DETECTED NONE DETECTED   Benzodiazepines NONE DETECTED NONE DETECTED   Amphetamines NONE DETECTED NONE DETECTED   Tetrahydrocannabinol NONE DETECTED NONE DETECTED   Barbiturates NONE DETECTED NONE DETECTED    Comment:        DRUG SCREEN FOR MEDICAL PURPOSES ONLY.  IF CONFIRMATION IS NEEDED FOR ANY PURPOSE, NOTIFY LAB WITHIN 5 DAYS.        LOWEST DETECTABLE LIMITS FOR URINE DRUG SCREEN Drug Class       Cutoff (ng/mL) Amphetamine      1000 Barbiturate      200 Benzodiazepine   782 Tricyclics       956 Opiates          300 Cocaine          300 THC              50   Comprehensive metabolic panel     Status: Abnormal   Collection Time: 01/01/16 12:58 PM  Result Value Ref Range   Sodium 140 135 - 145 mmol/L   Potassium 4.0 3.5 - 5.1 mmol/L   Chloride 106 101 - 111 mmol/L   CO2 24 22 - 32 mmol/L  Glucose, Bld 121 (H) 65 - 99 mg/dL   BUN 11 6 - 20 mg/dL   Creatinine, Ser 0.82 0.61 - 1.24 mg/dL   Calcium 9.8 8.9 - 10.3 mg/dL   Total Protein 8.5 (H) 6.5 - 8.1 g/dL   Albumin 5.0 3.5 - 5.0 g/dL   AST 25 15 - 41 U/L   ALT 30 17 - 63 U/L   Alkaline Phosphatase 112 38 - 126 U/L   Total Bilirubin 0.7 0.3 - 1.2 mg/dL   GFR calc non Af Amer >60 >60 mL/min   GFR calc Af Amer >60 >60 mL/min    Comment: (NOTE) The eGFR has been calculated using the CKD EPI equation. This calculation has not been validated in all clinical situations. eGFR's persistently <60 mL/min signify possible Chronic Kidney Disease.    Anion gap 10 5 - 15  cbc     Status: Abnormal   Collection Time: 01/01/16 12:58 PM  Result Value Ref Range   WBC 10.3 4.0 - 10.5 K/uL   RBC 5.93 (H) 4.22 - 5.81 MIL/uL   Hemoglobin 16.8 13.0 - 17.0 g/dL   HCT 47.0 39.0 - 52.0 %   MCV 79.3 78.0 - 100.0 fL   MCH 28.3 26.0 - 34.0 pg   MCHC 35.7 30.0 - 36.0 g/dL   RDW 13.7 11.5 - 15.5 %   Platelets 407 (H) 150 - 400 K/uL  Ethanol     Status: None   Collection Time: 01/01/16 12:59 PM   Result Value Ref Range   Alcohol, Ethyl (B) <5 <5 mg/dL    Comment:        LOWEST DETECTABLE LIMIT FOR SERUM ALCOHOL IS 5 mg/dL FOR MEDICAL PURPOSES ONLY   Salicylate level     Status: None   Collection Time: 01/01/16 12:59 PM  Result Value Ref Range   Salicylate Lvl <6.9 2.8 - 30.0 mg/dL  Acetaminophen level     Status: Abnormal   Collection Time: 01/01/16 12:59 PM  Result Value Ref Range   Acetaminophen (Tylenol), Serum <10 (L) 10 - 30 ug/mL    Comment:        THERAPEUTIC CONCENTRATIONS VARY SIGNIFICANTLY. A RANGE OF 10-30 ug/mL MAY BE AN EFFECTIVE CONCENTRATION FOR MANY PATIENTS. HOWEVER, SOME ARE BEST TREATED AT CONCENTRATIONS OUTSIDE THIS RANGE. ACETAMINOPHEN CONCENTRATIONS >150 ug/mL AT 4 HOURS AFTER INGESTION AND >50 ug/mL AT 12 HOURS AFTER INGESTION ARE OFTEN ASSOCIATED WITH TOXIC REACTIONS.    Blood Alcohol level:  Lab Results  Component Value Date   ETH <5 01/01/2016   ETH <5 67/89/3810   Metabolic Disorder Labs:  Lab Results  Component Value Date   HGBA1C 6.5* 09/12/2015   MPG 140 09/12/2015   No results found for: PROLACTIN Lab Results  Component Value Date   CHOL 215* 09/12/2015   TRIG 275* 09/12/2015   HDL 32* 09/12/2015   CHOLHDL 6.7 09/12/2015   VLDL 55* 09/12/2015   LDLCALC 128* 09/12/2015    Current Medications: Current Facility-Administered Medications  Medication Dose Route Frequency Provider Last Rate Last Dose  . acetaminophen (TYLENOL) tablet 650 mg  650 mg Oral Q6H PRN Benjamine Mola, FNP      . alum & mag hydroxide-simeth (MAALOX/MYLANTA) 200-200-20 MG/5ML suspension 30 mL  30 mL Oral Q4H PRN Benjamine Mola, FNP      . cloNIDine (CATAPRES) tablet 0.1 mg  0.1 mg Oral QID Benjamine Mola, FNP   0.1 mg at 01/01/16 2133   Followed by  . [  START ON 01/04/2016] cloNIDine (CATAPRES) tablet 0.1 mg  0.1 mg Oral BH-qamhs Benjamine Mola, FNP       Followed by  . [START ON 01/06/2016] cloNIDine (CATAPRES) tablet 0.1 mg  0.1 mg Oral QAC breakfast  Benjamine Mola, FNP      . dicyclomine (BENTYL) tablet 20 mg  20 mg Oral Q6H PRN Benjamine Mola, FNP      . gabapentin (NEURONTIN) capsule 400 mg  400 mg Oral TID AC & HS Benjamine Mola, FNP   400 mg at 01/01/16 2130  . hydrOXYzine (ATARAX/VISTARIL) tablet 25 mg  25 mg Oral Q6H PRN Benjamine Mola, FNP   25 mg at 01/01/16 1847  . loperamide (IMODIUM) capsule 2-4 mg  2-4 mg Oral PRN Benjamine Mola, FNP      . magnesium hydroxide (MILK OF MAGNESIA) suspension 30 mL  30 mL Oral Daily PRN Benjamine Mola, FNP      . methocarbamol (ROBAXIN) tablet 500 mg  500 mg Oral Q8H PRN Benjamine Mola, FNP      . naproxen (NAPROSYN) tablet 500 mg  500 mg Oral BID PRN Benjamine Mola, FNP      . ondansetron (ZOFRAN-ODT) disintegrating tablet 4 mg  4 mg Oral Q6H PRN Benjamine Mola, FNP      . traZODone (DESYREL) tablet 100 mg  100 mg Oral QHS PRN,MR X 1 Laverle Hobby, PA-C   100 mg at 01/01/16 2213   PTA Medications: Prescriptions prior to admission  Medication Sig Dispense Refill Last Dose  . lamoTRIgine (LAMICTAL) 100 MG tablet Take 1 tablet (100 mg total) by mouth daily. For mood stabilization 14 tablet 0 01/01/2016 at Unknown time  . sertraline (ZOLOFT) 100 MG tablet Take 2 tablets (200 mg total) by mouth daily. For depression 28 tablet 0 01/01/2016 at Unknown time  . traZODone (DESYREL) 100 MG tablet Take 1 tablet (100 mg total) by mouth at bedtime as needed for sleep. (Patient taking differently: Take 200 mg by mouth at bedtime. ) 14 tablet 0 12/31/2015 at Unknown time  . gabapentin (NEURONTIN) 800 MG tablet Take 1 tablet (800 mg total) by mouth 4 (four) times daily - after meals and at bedtime. For agitation 56 tablet 0 01/01/2016  . HYDROcodone-acetaminophen (NORCO/VICODIN) 5-325 MG tablet Take 1 tablet by mouth every 6 (six) hours as needed. (Patient not taking: Reported on 01/01/2016) 10 tablet 0 Unknown at Unknown time  . hydrOXYzine (ATARAX/VISTARIL) 25 MG tablet Take 2 tablets (50 mg total) by mouth every 6  (six) hours as needed for anxiety. (Patient not taking: Reported on 01/01/2016) 90 tablet 0 Unknown at Unknown time  . hydrOXYzine (VISTARIL) 50 MG capsule Take 50 mg by mouth every 6 (six) hours as needed for anxiety.    Unknown at Unknown time  . ibuprofen (ADVIL,MOTRIN) 200 MG tablet Take 400 mg by mouth every 6 (six) hours as needed for headache.   Unknown at Unknown time  . naphazoline-glycerin (CLEAR EYES) 0.012-0.2 % SOLN Place 1-2 drops into both eyes 4 (four) times daily as needed for irritation.   Unknown at Unknown time  . nicotine polacrilex (NICORETTE) 2 MG gum Take 1 each (2 mg total) by mouth as needed for smoking cessation. 100 tablet 0 Unknown at Unknown time   Musculoskeletal: Strength & Muscle Tone: within normal limits Gait & Station: normal Patient leans: N/A  Psychiatric Specialty Exam: Physical Exam  Genitourinary:  Denies any issues in this area  Psychiatric: His speech is normal. His mood appears anxious. His affect is angry and inappropriate. His affect is not blunt and not labile. He is agitated and aggressive. Cognition and memory are normal. He expresses impulsivity and inappropriate judgment. He exhibits a depressed mood.    Review of Systems  Constitutional: Negative.   HENT: Negative.   Eyes: Negative.   Respiratory: Negative.   Cardiovascular: Negative.   Gastrointestinal: Negative.   Genitourinary: Negative.   Musculoskeletal: Negative.   Skin: Negative.   Neurological: Negative.   Endo/Heme/Allergies: Negative.   Psychiatric/Behavioral: Positive for depression and substance abuse (Alcoholism,). Negative for suicidal ideas, hallucinations and memory loss. The patient is nervous/anxious and has insomnia.     Blood pressure 112/64, pulse 81, temperature 98.4 F (36.9 C), temperature source Oral, resp. rate 18, height 5' 7"  (1.702 m), weight 104.327 kg (230 lb), SpO2 99 %.Body mass index is 36.01 kg/(m^2).  General Appearance: Casual  Eye Contact::   Good  Speech:  Clear and Coherent  Volume:  Normal  Mood:  Anxious and Depressed  Affect:  Congruent  Thought Process:  Coherent and Goal Directed  Orientation:  Full (Time, Place, and Person)  Thought Content:  Symptoms, worries, concerns   Suicidal Thoughts:  No  Homicidal Thoughts:  No  Memory:  Immediate;   Good Recent;   Good Remote;   Good  Judgement:  Impaired  Insight:  Lacking  Psychomotor Activity:  Normal  Concentration:  Good  Recall:  Good  Fund of Knowledge:Fair  Language: Good  Akathisia:  No  Handed:  Right  AIMS (if indicated):     Assets:  Communication Skills Desire for Improvement Financial Resources/Insurance Leisure Time Physical Health Resilience Social Support Vocational/Educational  ADL's:  Intact  Cognition: WNL  Sleep:      Treatment Plan/Recommendations: 1. Admit for crisis management and stabilization, estimated length of stay 24 hours.   2. Medication management to reduce current symptoms to base line and improve the patient's overall level of functioning. Will resume home medications.  3. Treat health problems as indicated.  4. Develop treatment plan to decrease risk of relapse upon discharge and the need for readmission.  5. Psycho-social education regarding relapse prevention and self care.  6. Health care follow up as needed for medical problems.  7. Review, reconcile, and reinstate any pertinent home medications for other health issues where appropriate.  Observation Level/Precautions:  Continuous Observation  Laboratory:  CBC Chemistry Profile UDS  Psychotherapy: Individual for substance abuse counseling   Consultations:  Was sent for medical clearance at the ED  Discharge Concerns: Compliance with follow up recommendations  Estimated LOS: Less than 24 hours  Other:  Needs appointment for outpatient follow up    Nanci Pina, FNP, 6/29/20171:37 PM

## 2016-01-01 NOTE — ED Provider Notes (Signed)
CSN: [id number]     Arrival date & time 01/01/16  1206 History   First MD Initiated Contact with Patient 01/01/16 1256     Chief Complaint  Patient presents with  . Detox   . Suicidal     HPI Patient presents with heroin abuse and suicidal thoughts/depression. States that he uses about half gram a day and last use yesterday. States he is suicidal but is use. States he would rather die if he cannot get off it. Has had some suicidal thoughts. States he is worried he would do something. History of depression. Has a psychiatrist in [place] that he sees. States he does not have one in town here. No fevers. No chest pain. States he has had some nausea. States he does feel little short of breath. States he is withdrawing off the heroin now. He is injecting in his antecubital areas.   Past Medical History  Diagnosis Date  . Anxiety   . Suicidal thoughts    History reviewed. No pertinent past surgical history. No family history on file. Social History  Substance Use Topics  . Smoking status: Current Every Day Smoker -- 0.50 packs/day  . Smokeless tobacco: None  . Alcohol Use: Yes    Review of Systems  Constitutional: Positive for appetite change. Negative for activity change.  Eyes: Negative for pain.  Respiratory: Positive for shortness of breath. Negative for chest tightness.   Cardiovascular: Negative for chest pain and leg swelling.  Gastrointestinal: Positive for nausea, vomiting and diarrhea. Negative for abdominal pain.  Genitourinary: Negative for flank pain.  Musculoskeletal: Negative for back pain and neck stiffness.  Skin: Negative for rash.  Neurological: Negative for weakness, numbness and headaches.  Psychiatric/Behavioral: Positive for suicidal ideas. Negative for behavioral problems.      Allergies  Review of patient's allergies indicates no known allergies.  Home Medications   Prior to Admission medications   Medication Sig Start Date End Date Taking? Authorizing  Provider  gabapentin (NEURONTIN) 800 MG tablet Take 1 tablet (800 mg total) by mouth 4 (four) times daily - after meals and at bedtime. For agitation 10/16/15  Yes Beau FannyJohn C Withrow, FNP  hydrOXYzine (VISTARIL) 50 MG capsule Take 50 mg by mouth every 6 (six) hours as needed for anxiety.  08/11/15  Yes Historical Provider, MD  ibuprofen (ADVIL,MOTRIN) 200 MG tablet Take 400 mg by mouth every 6 (six) hours as needed for headache.   Yes Historical Provider, MD  lamoTRIgine (LAMICTAL) 100 MG tablet Take 1 tablet (100 mg total) by mouth daily. For mood stabilization 10/16/15  Yes Beau FannyJohn C Withrow, FNP  naphazoline-glycerin (CLEAR EYES) 0.012-0.2 % SOLN Place 1-2 drops into both eyes 4 (four) times daily as needed for irritation.   Yes Historical Provider, MD  nicotine polacrilex (NICORETTE) 2 MG gum Take 1 each (2 mg total) by mouth as needed for smoking cessation. 10/16/15  Yes Beau FannyJohn C Withrow, FNP  sertraline (ZOLOFT) 100 MG tablet Take 2 tablets (200 mg total) by mouth daily. For depression 10/16/15  Yes Beau FannyJohn C Withrow, FNP  traZODone (DESYREL) 100 MG tablet Take 1 tablet (100 mg total) by mouth at bedtime as needed for sleep. Patient taking differently: Take 200 mg by mouth at bedtime.  10/16/15  Yes Beau FannyJohn C Withrow, FNP  HYDROcodone-acetaminophen (NORCO/VICODIN) 5-325 MG tablet Take 1 tablet by mouth every 6 (six) hours as needed. Patient not taking: Reported on 01/01/2016 10/30/15   Tilden FossaElizabeth Rees, MD  hydrOXYzine (ATARAX/VISTARIL) 25 MG tablet Take 2  tablets (50 mg total) by mouth every 6 (six) hours as needed for anxiety. Patient not taking: Reported on 01/01/2016 10/16/15   Beau FannyJohn C Withrow, FNP   BP 116/70 mmHg  Pulse 93  Temp(Src) 97.7 F (36.5 C) (Oral)  Resp 18  Ht 5\' 7"  (1.702 m)  Wt 230 lb (104.327 kg)  BMI 36.01 kg/m2  SpO2 100% Physical Exam  Constitutional: He appears well-developed.  HENT:  Head: Atraumatic.  Eyes: EOM are normal.  Neck: Neck supple.  Cardiovascular: Normal rate.    Pulmonary/Chest: Effort normal.  Abdominal: Soft.  Musculoskeletal: Normal range of motion.  Neurological: He is alert.  Skin: Skin is warm.  anticubital injection sites without infection  Psychiatric: He has a normal mood and affect.    ED Course  Procedures (including critical care time) Labs Review Labs Reviewed  COMPREHENSIVE METABOLIC PANEL - Abnormal; Notable for the following:    Glucose, Bld 121 (*)    Total Protein 8.5 (*)    All other components within normal limits  ACETAMINOPHEN LEVEL - Abnormal; Notable for the following:    Acetaminophen (Tylenol), Serum <10 (*)    All other components within normal limits  CBC - Abnormal; Notable for the following:    RBC 5.93 (*)    Platelets 407 (*)    All other components within normal limits  URINE RAPID DRUG SCREEN, HOSP PERFORMED - Abnormal; Notable for the following:    Opiates POSITIVE (*)    All other components within normal limits  ETHANOL  SALICYLATE LEVEL    Imaging Review No results found. I have personally reviewed and evaluated these images and lab results as part of my medical decision-making.   EKG Interpretation None      MDM   Final diagnoses:  Heroin abuse  Depression    Patient presents with heroin abuse and depression. Some suicidal thoughts. Appears medically cleared at this time. Started on detox protocol. Home medicines restarted. Her to be seen by TTS.  Benjiman CoreNathan Renezmae Canlas, MD 01/01/16 1400

## 2016-01-01 NOTE — [facility] Counselor (Signed)
OBS Counselor met with patient upon admission to discuss possible disposition plans. Pt is irritable and very demanding. Pt shared that he is endorsing SI with a plan to shoot himself. Pt is also detoxing from heroin. Pt would be suitable for transfer to the Adult Inpatient Unit on tomorrow. Patient will be monitored throughout the remainder of the shift and evaluated later by evening extender to assist in treatment.   Justin Pulling, MA OBS Counselor

## 2016-01-01 NOTE — Progress Notes (Signed)
Patient ID: Beecher McardleDavid Graef, male   DOB: 10/19/1985, 30 y.o.   MRN: [id number] Patient admitted to obs after stating he was suicidal with a plan to wreck his car or shoot himself.  Patient reported he does not have a gun or access to one.  Patient using heroin daily and alcohol 4 or more times a week. Last use of heroin yesterdayand last use of alcohol 4 days ago. Patient reported drinking 6 or more drinks when drinking.  Patient has had multiple suicde attempts by cutting wrist and by eating rat poison. [facility]. Patient has no hx of seizures .  Stated he has lost everything due to his drug use, his relationships, work, money.  Also states he has had legal problems in the past. Denies HI and AVH at this time. Reports he has SI but contracts verbally for safety.  Oriented to unit. Food provided.

## 2016-01-01 NOTE — ED Notes (Signed)
[facility] per MD order. Pt signed for personal belongings and personal belongings given to transport service for transport. Pt signed e-signature. Ambulatory off unit with transport service.

## 2016-01-01 NOTE — ED Notes (Signed)
Patient states "I want to get off heroine. I have had suicidal thoughts about this whole situation." Denies plan. Denies HI.

## 2016-01-02 DIAGNOSIS — F332 Major depressive disorder, recurrent severe without psychotic features: Secondary | ICD-10-CM | POA: Diagnosis not present

## 2016-01-02 MED ORDER — LAMOTRIGINE 100 MG PO TABS: 100.0000 mg | ORAL_TABLET | Freq: Every day | ORAL | Status: DC

## 2016-01-02 MED ORDER — HYDROXYZINE PAMOATE 50 MG PO CAPS: 50.0000 mg | ORAL_CAPSULE | Freq: Four times a day (QID) | ORAL | Status: DC | PRN

## 2016-01-02 MED ORDER — NICOTINE POLACRILEX 2 MG MT GUM
2.0000 mg | CHEWING_GUM | OROMUCOSAL | Status: DC | PRN
  Administered 2016-01-02 (×2): 2 mg via ORAL
  Filled 2016-01-02 (×2): qty 1

## 2016-01-02 MED ORDER — TRAZODONE HCL 100 MG PO TABS: 100.0000 mg | ORAL_TABLET | Freq: Every evening | ORAL | Status: DC | PRN

## 2016-01-02 MED ORDER — SERTRALINE HCL 100 MG PO TABS: 200.0000 mg | ORAL_TABLET | Freq: Every day | ORAL | Status: DC

## 2016-01-02 MED ORDER — GABAPENTIN 400 MG PO CAPS: 400.0000 mg | ORAL_CAPSULE | Freq: Three times a day (TID) | ORAL | Status: DC

## 2016-01-02 NOTE — Discharge Summary (Signed)
Physician Discharge Summary Note  Patient:  Justin Fisher is an 30 y.o., male MRN:  [id number] DOB:  April 24, 1986 Patient phone:  [phone] (home)  Patient address:   [address] [place] [id number],  Total Time spent with patient: 45 minutes  Date of Admission:  01/01/2016 Date of Discharge: 01/02/2016  Reason for Admission:  Subjective: Pt seen and chart reviewed. Pt is alert/oriented x4, calm, cooperative, and appropriate to situation. Pt denies suicidal/homicidal ideation and psychosis and does not appear to be responding to internal stimuli. Pt reports that he felt suicidal yesterday but now feels much better with the support system, home medications, and care coordination by the OBS UNIT staff members. Pt would like to discharge home now.  History of Present Illness: I have reviewed and concur with HPI elements below: Justin Fisher is an 30 y.o. male with history of Anxiety and Suicidal Ideations. [facility]. Patient stared using Heroin during his early 20's. 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 Patient has used Heroin consistently over the past 1.5 month. He reports using .5 daily and last used yesterday. UDS + for O Patient asked about abusing any additional drugs (prescription and illicit). Sts, "Not really". He does admit consuming alcohol every other day. He drinks 6 to 8 beers per use. Last drinks was 3 to 4 days ago. Patient's BAL is negative. Several withdrawal symptoms reported: nausea, diarrhea, increased anxiety, flu like symptoms, chills, hot flashes and sweats. No history of seizures. Patient does have a history of black outs. Patient has [facility]. [facility].   Patient has suicidal ideations. No plan or intent. He has a previous history of cutting wrist and  eating rat poison. Trigger for previous attempt was substance abuse. Today's trigger for the current suicidal thoughts are also related to his substance abuse and withdrawal symptoms. Denies self mutilating behaviors. No family history of mental health illness or substance use. Patient has depressive symptoms including: loss of interest pleasures, fatigue, and hopelessness.  Pt spent the night in the OBS UNIT without incident. Today, on 01/02/2016 he was seen again and is doing very well as mentioned above.   Collateral from Dad: Justin Fisher was able to confirm information above, including moms recent diagnosis of stage 4 cancer. She notified her children that she has about 1 year left, and treatment options are very limited. Dad states he has always been there for his son, Justin Fisher just chooses not to reach out to him. However him coming to the hospital to get help instead of going on a drinking binge when his mother advised him of her cancer, is showing signs of improvement for my son. If I need to come to [place] to help or support him I will, I just an hour away in [place] area.   Total Time spent with patient: 45 minutes  Past Psychiatric History: MDD, Alcohol abuse, Reports remission of heroine abuse Prior Inpatient Therapy: Yes, [place], [place], [place], [place] Prior Outpatient Therapy: Yes but recently missed his appointment with the [facility]   Principal Problem: MDD (major depressive disorder), recurrent severe, without psychosis (HCC) Discharge Diagnoses: Patient Active Problem List   Diagnosis Date Noted  . 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 MDD (major depressive disorder), recurrent severe, without psychosis (HCC) [F33.2] 09/11/2015  . Alcohol use disorder, moderate, dependence (HCC)  [F10.20] 09/11/2015    Past Psychiatric History:heroin use, Alcohol use, MDD  Past Medical History:  Past Medical History  Diagnosis Date  . Anxiety   . Suicidal thoughts    History reviewed. No pertinent past surgical history. Family History: History reviewed. No pertinent family history. Family Psychiatric  History:See HPI Social History:  History  Alcohol Use  . Yes     History  Drug Use No    Social History   Social History  . Marital Status: Single    Spouse Name: N/A  . Number of Children: N/A  . Years of Education: N/A   Social History Main Topics  . Smoking status: Current Every Day Smoker -- 0.50 packs/day  . Smokeless tobacco: None  . Alcohol Use: Yes  . Drug Use: No  . Sexual Activity: Not Asked   Other Topics Concern  . None   Social History Narrative    Hospital Course:  Justin Fisher is a 30 year old Caucasian male. Admitted to the [facility] adult unit from the [facility] ED with complaints of worsening depression & suicidal ideations. During this assessment, Justin Fisher was very irritated, agitated & unco-operative. In the beginning of this assessment, he reports, I went to the hospital yesterday because I was feeling suicidal. I was afraid I was going to hurt myself. My trigger was my mom calling me to tell me she had terminal cancer. I thought she would be able to get better.  Justin Fisher was admitted to the [facility] with a UDS positive for opiates, negative alcohol levels, and all other labs within normal. However, on the day of his admission, he was very angry & uncooperative during the assessment that he was not able to provide the staff with any useful information pertaining to his hospitalization. Per record review, it was noted that patient has been receiving some mental health care coupled with his problem with previous alcoholism and drug use. After admission assessment, it was determined that he will need drug detoxification/mood stabilization  treatments to combat any withdrawal symptoms he may experience & to stabilize his mood.   After admission assessment/evaluation, Justin Fisher was started on Clonidine detox protocols for opiate detox. He was medicated & discharged on; gabapentin 400 mg for agitation, Hydroxyzine 25 & 50 mg prn respectively for anxiety, Lamictal 100 mg for mood stabilization, Sertraline 200 mg for depression & Trazodone 100 mg for insomnia. He has agreed to resume group counseling sessions being offered and resume use of coping skills. He presented no other significant medical issues other than agitation episodes that required treatment or monitoring. He tolerated his treatment regimen without any adverse effects or reactions.  Justin Fisher has completed detox treatments without complications. His mood is stable. His anxiety symptoms stabilized. There are no evidence of substance withdrawal symptoms. He appears to be medically & mentally stable to be discharged to continue treatment as noted below. He adamantly denies any SIHI, AVH, delusional thoughts or paranoia. He left [facility] with all personal belongings in no apparent distress. Transportation his arrangement.  Physical Findings: AIMS: Facial and Oral Movements Muscles of Facial Expression: None, normal Lips and Perioral Area: None, normal Jaw: None, normal Tongue: None, normal,Extremity Movements Upper (arms, wrists, hands, fingers): None, normal Lower (legs, knees, ankles, toes): None, normal, Trunk Movements Neck, shoulders, hips: None, normal, Overall Severity Severity  of abnormal movements (highest score from questions above): None, normal Incapacitation due to abnormal movements: None, normal Patient's awareness of abnormal movements (rate only patient's report): No Awareness, Dental Status Current problems with teeth and/or dentures?: No Does patient usually wear dentures?: No  CIWA:  CIWA-Ar Total: 5 COWS:  COWS Total Score: 2  Musculoskeletal: Strength & Muscle  Tone: within normal limits Gait & Station: normal Patient leans: N/A  Psychiatric Specialty Exam: Physical Exam  Nursing note and vitals reviewed. Constitutional: He is oriented to person, place, and time. He appears well-developed.  HENT:  Head: Normocephalic.  Eyes: Pupils are equal, round, and reactive to light.  Musculoskeletal: Normal range of motion.  Neurological: He is alert and oriented to person, place, and time.  Skin: Skin is warm and dry.  Psychiatric: He has a normal mood and affect. His behavior is normal. Judgment and thought content normal.    Review of Systems  Psychiatric/Behavioral: Positive for hallucinations and substance abuse. Negative for depression, suicidal ideas and memory loss. The patient is not nervous/anxious and does not have insomnia.   All other systems reviewed and are negative.   Blood pressure 119/86, pulse 70, temperature 98.2 F (36.8 C), temperature source Oral, resp. rate 18, height  (1.702 m), weight 104.327 kg (230 lb), SpO2 99 %.Body mass index is 36.01 kg/(m^2).  General Appearance: Fairly Groomed paper scrubs  Eye Contact:  Fair  Speech:  Clear and Coherent and Normal Rate  Volume:  Normal  Mood:  Euthymic  Affect:  Appropriate and Congruent  Thought Process:  Coherent and Goal Directed  Orientation:  Full (Time, Place, and Person)  Thought Content:  WDL and Logical  Suicidal Thoughts:  No  Homicidal Thoughts:  No  Memory:  Immediate;   Good Recent;   Good  Judgement:  Good  Insight:  Good  Psychomotor Activity:  Normal  Concentration:  Concentration: Fair  Recall:  Fair  Fund of Knowledge:  Fair  Language:  Fair  Akathisia:  No  Handed:  Right  AIMS (if indicated):     Assets:  Communication Skills Desire for Improvement Financial Resources/Insurance Physical Health Social Support Vocational/Educational  ADL's:  Intact  Cognition:  WNL  Sleep:        Have you used any form of tobacco in the last 30 days?  (Cigarettes, Smokeless Tobacco, Cigars, and/or Pipes): Yes  Has this patient used any form of tobacco in the last 30 days? (Cigarettes, Smokeless Tobacco, Cigars, and/or Pipes) Yes, No  Blood Alcohol level:  Lab Results  Component Value Date   ETH <5 01/01/2016   ETH <5 10/15/2015    Metabolic Disorder Labs:  Lab Results  Component Value Date   HGBA1C 6.5* 09/12/2015   MPG 140 09/12/2015   No results found for: PROLACTIN Lab Results  Component Value Date   CHOL 215* 09/12/2015   TRIG 275* 09/12/2015   HDL 32* 09/12/2015   CHOLHDL 6.7 09/12/2015   VLDL 55* 09/12/2015   LDLCALC 128* 09/12/2015    See Psychiatric Specialty Exam and Suicide Risk Assessment completed by Attending Physician prior to discharge.  Discharge destination:  Home  Is patient on multiple antipsychotic therapies at discharge:  No   Has Patient had three or more failed trials of antipsychotic monotherapy by history:  No  Recommended Plan for Multiple Antipsychotic Therapies: NA  Discharge Instructions    Discharge instructions    Complete by:  As directed   Discharge Recommendations:  The patient  is being discharged to his family. Patient is to take his discharge medications as ordered. See follow up below. We recommend that he participate in individual therapy to target depressive symptoms, drug use, and improving coping skills. Discussed with patient the importance of making responsible decisions and talking with his parents. He states his mom was just diagnosed with terminal cancer, and this triggered his depression symptoms. Pt has a good family support system that he can continue to use to help maximize his safety plan and treatment options. Encouraged patient to trust his outpatient provider and therapist to ensure that he gets the most information out of each session so that he can make more informative decisions about his care. He is asked to make sure he is familiar with the symptoms of  depression, so that he can advise his therapist when things begin to become abnormal for him.  We recommend that he participate in family therapy to target the conflict with his family as well as grief and loss , and improving communication skills and conflict resolution skills. Family is to initiate/implement a contingency based behavioral model to address patient's behavior. The patient should abstain from all illicit substances, alcohol, and peer pressure. If the patient's symptoms worsen or do not continue to improve or if the patient becomes actively suicidal or homicidal then it is recommended that the patient return to the closest hospital emergency room or call 911 for further evaluation and treatment. National Suicide Prevention Lifeline 1800-SUICIDE or [phone]. Please follow up with your primary medical doctor for all other medical needs.    The patient has been educated on the possible side effects to medications and he/his guardian is to contact a medical professional and inform outpatient provider of any new side effects of medication. He is to take regular diet and activity as tolerated.  Family was educated about removing/locking any firearms, medications or dangerous products from the home.            Medication List    STOP taking these medications        gabapentin 800 MG tablet  Commonly known as:  NEURONTIN  Replaced by:  gabapentin 400 MG capsule     HYDROcodone-acetaminophen 5-325 MG tablet  Commonly known as:  NORCO/VICODIN     hydrOXYzine 25 MG tablet  Commonly known as:  ATARAX/VISTARIL     naphazoline-glycerin 0.012-0.2 % Soln  Commonly known as:  CLEAR EYES     nicotine polacrilex 2 MG gum  Commonly known as:  NICORETTE      TAKE these medications      Indication   gabapentin 400 MG capsule  Commonly known as:  NEURONTIN  Take 1 capsule (400 mg total) by mouth 3 (three) times daily.   Indication:  Agitation, drug dependence      hydrOXYzine 50 MG capsule  Commonly known as:  VISTARIL  Take 1 capsule (50 mg total) by mouth every 6 (six) hours as needed for anxiety.   Indication:  Anxiety Neurosis, Sedation, Tension     ibuprofen 200 MG tablet  Commonly known as:  ADVIL,MOTRIN  Take 400 mg by mouth every 6 (six) hours as needed for headache.      lamoTRIgine 100 MG tablet  Commonly known as:  LAMICTAL  Take 1 tablet (100 mg total) by mouth daily. For mood stabilization   Indication:  Mood stabilization     sertraline 100 MG tablet  Commonly known as:  ZOLOFT  Take 2 tablets (200 mg  total) by mouth daily. For depression   Indication:  Major Depressive Disorder     traZODone 100 MG tablet  Commonly known as:  DESYREL  Take 1 tablet (100 mg total) by mouth at bedtime as needed and may repeat dose one time if needed for sleep.   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

## 2016-01-02 NOTE — Progress Notes (Signed)
D: Pt d/c home as ordered. Received 30 days prescription of ordered medications. Pt also received a list of substance abuse treatment providers in the community and other outpatient resources as well. Pt encouraged to follow up with outpatient provider and community resources. A: Scheduled and PRN medications administered as ordered. Support and encouragement provided to pt. D/C instructions reviewed with pt including prescriptions. All belongings in locker 37 returned to pt at time of d/c. Safety maintained in Obs. Unit till time of d/c without outburst or self injurious behavior.  R: Pt A & O X4. Denied AVH, HI and pain at time of assessment. Initially endorsed SI, which he later changed after conversations with his parents and requested d/c instead of inpatient treatment. Presents with blunt affect and irritable mood on initial contact but brightened up and became more demanding as the shift progressed. Verbalized understanding related to d/c instructions. Signed belonging sheet in agreement with items received from locker. Ambulatory without physical distress at time of d/c.

## 2016-01-02 NOTE — Progress Notes (Signed)
Pt in bed in a deep state of sleep initially.  Pt wakes up in poor mood and begins to complain about wanting food.  Pt denies any pain or discomfort and remains drowsy.  Pt wants to have something available to take for sleep and sts he takes 200mg  of Trazodone at home.   Pt given snacks.  Provider on call authorizes sleep medications. Pt continuously observed for safety on unit except when in bathroom. Pt currently asleep. Pt remains safe on unit.

## 2016-02-10 ENCOUNTER — Other Ambulatory Visit (HOSPITAL_COMMUNITY): Payer: Self-pay | Admitting: Psychiatry

## 2016-02-28 ENCOUNTER — Emergency Department (HOSPITAL_COMMUNITY)
Admission: EM | Admit: 2016-02-28 | Discharge: 2016-02-28 | Disposition: A | Discharge status: Home / Self Care | Payer: BLUE CROSS/BLUE SHIELD | Attending: Emergency Medicine | Admitting: Emergency Medicine

## 2016-02-28 ENCOUNTER — Inpatient Hospital Stay (HOSPITAL_COMMUNITY)
Admission: AD | Admit: 2016-02-28 | Discharge: 2016-03-03 | DRG: 897 | Disposition: A | Discharge status: Home / Self Care | Payer: BLUE CROSS/BLUE SHIELD | Source: Intra-hospital | Attending: Psychiatry | Admitting: Psychiatry

## 2016-02-28 ENCOUNTER — Encounter (HOSPITAL_COMMUNITY): Payer: Self-pay | Admitting: *Deleted

## 2016-02-28 DIAGNOSIS — Z5181 Encounter for therapeutic drug level monitoring: Secondary | ICD-10-CM | POA: Diagnosis not present

## 2016-02-28 DIAGNOSIS — F17200 Nicotine dependence, unspecified, uncomplicated: Secondary | ICD-10-CM | POA: Diagnosis present

## 2016-02-28 DIAGNOSIS — F332 Major depressive disorder, recurrent severe without psychotic features: Secondary | ICD-10-CM | POA: Diagnosis present

## 2016-02-28 DIAGNOSIS — F419 Anxiety disorder, unspecified: Secondary | ICD-10-CM | POA: Diagnosis present

## 2016-02-28 DIAGNOSIS — F1910 Other psychoactive substance abuse, uncomplicated: Secondary | ICD-10-CM | POA: Insufficient documentation

## 2016-02-28 DIAGNOSIS — R45851 Suicidal ideations: Secondary | ICD-10-CM | POA: Diagnosis present

## 2016-02-28 DIAGNOSIS — F329 Major depressive disorder, single episode, unspecified: Secondary | ICD-10-CM | POA: Diagnosis present

## 2016-02-28 DIAGNOSIS — Z915 Personal history of self-harm: Secondary | ICD-10-CM

## 2016-02-28 DIAGNOSIS — F1120 Opioid dependence, uncomplicated: Principal | ICD-10-CM | POA: Diagnosis present

## 2016-02-28 DIAGNOSIS — F1410 Cocaine abuse, uncomplicated: Secondary | ICD-10-CM | POA: Diagnosis present

## 2016-02-28 DIAGNOSIS — F32A Depression, unspecified: Secondary | ICD-10-CM | POA: Diagnosis present

## 2016-02-28 DIAGNOSIS — G4700 Insomnia, unspecified: Secondary | ICD-10-CM | POA: Diagnosis present

## 2016-02-28 DIAGNOSIS — F10239 Alcohol dependence with withdrawal, unspecified: Secondary | ICD-10-CM | POA: Diagnosis present

## 2016-02-28 DIAGNOSIS — Z818 Family history of other mental and behavioral disorders: Secondary | ICD-10-CM

## 2016-02-28 LAB — CBC
HCT: 42.6 % (ref 39.0–52.0)
Hemoglobin: 14.9 g/dL (ref 13.0–17.0)
MCH: 28 pg (ref 26.0–34.0)
MCHC: 35 g/dL (ref 30.0–36.0)
MCV: 79.9 fL (ref 78.0–100.0)
PLATELETS: 409 10*3/uL — AB (ref 150–400)
RBC: 5.33 MIL/uL (ref 4.22–5.81)
RDW: 13.7 % (ref 11.5–15.5)
WBC: 11.7 10*3/uL — ABNORMAL HIGH (ref 4.0–10.5)

## 2016-02-28 LAB — COMPREHENSIVE METABOLIC PANEL
ALT: 19 U/L (ref 17–63)
AST: 20 U/L (ref 15–41)
Albumin: 4.5 g/dL (ref 3.5–5.0)
Alkaline Phosphatase: 85 U/L (ref 38–126)
Anion gap: 7 (ref 5–15)
BUN: 9 mg/dL (ref 6–20)
CHLORIDE: 107 mmol/L (ref 101–111)
CO2: 23 mmol/L (ref 22–32)
CREATININE: 0.79 mg/dL (ref 0.61–1.24)
Calcium: 9.4 mg/dL (ref 8.9–10.3)
GFR calc non Af Amer: >60 mL/min (ref >60)
Glucose, Bld: 128 mg/dL — ABNORMAL HIGH (ref 65–99)
POTASSIUM: 4 mmol/L (ref 3.5–5.1)
SODIUM: 137 mmol/L (ref 135–145)
TOTAL PROTEIN: 7.8 g/dL (ref 6.5–8.1)
Total Bilirubin: 0.7 mg/dL (ref 0.3–1.2)

## 2016-02-28 LAB — RAPID URINE DRUG SCREEN, HOSP PERFORMED
AMPHETAMINES: NOT DETECTED
Barbiturates: NOT DETECTED
Benzodiazepines: NOT DETECTED
Cocaine: POSITIVE — AB
OPIATES: POSITIVE — AB
Tetrahydrocannabinol: NOT DETECTED

## 2016-02-28 LAB — ETHANOL: Alcohol, Ethyl (B): <5 mg/dL (ref <5)

## 2016-02-28 MED ORDER — ALUM & MAG HYDROXIDE-SIMETH 200-200-20 MG/5ML PO SUSP: 30.0000 mL | ORAL | Status: DC | PRN

## 2016-02-28 MED ORDER — LORAZEPAM 1 MG PO TABS: 0.0000 mg | ORAL_TABLET | Freq: Two times a day (BID) | ORAL | Status: DC

## 2016-02-28 MED ORDER — IBUPROFEN 200 MG PO TABS: 600.0000 mg | ORAL_TABLET | Freq: Three times a day (TID) | ORAL | Status: DC | PRN

## 2016-02-28 MED ORDER — ACETAMINOPHEN 325 MG PO TABS: 650.0000 mg | ORAL_TABLET | ORAL | Status: DC | PRN

## 2016-02-28 MED ORDER — VITAMIN B-1 100 MG PO TABS
100.0000 mg | ORAL_TABLET | Freq: Every day | ORAL | Status: DC
  Administered 2016-02-28: 100 mg via ORAL
  Filled 2016-02-28: qty 1

## 2016-02-28 MED ORDER — ZOLPIDEM TARTRATE 5 MG PO TABS: 5.0000 mg | ORAL_TABLET | Freq: Every evening | ORAL | Status: DC | PRN

## 2016-02-28 MED ORDER — ONDANSETRON HCL 4 MG PO TABS: 4.0000 mg | ORAL_TABLET | Freq: Three times a day (TID) | ORAL | Status: DC | PRN

## 2016-02-28 MED ORDER — LORAZEPAM 1 MG PO TABS
0.0000 mg | ORAL_TABLET | Freq: Four times a day (QID) | ORAL | Status: DC
  Administered 2016-02-28: 1 mg via ORAL
  Filled 2016-02-28: qty 1

## 2016-02-28 MED ORDER — THIAMINE HCL 100 MG/ML IJ SOLN: 100.0000 mg | Freq: Every day | INTRAMUSCULAR | Status: DC

## 2016-02-28 NOTE — ED Notes (Signed)
Bed: WBH39 Expected date:  Expected time:  Means of arrival:  Comments: TR 4  

## 2016-02-28 NOTE — BH Assessment (Addendum)
Assessment Note  Justin McardleDavid Fisher is an 30 y.o. male that presents this date with some passive thoughts of self harm without a plan. Patient is requesting detox from Heroin stating he has been using 1/2 gram a day for the last month. Patient reported last use this date 02/28/16 when patient reported using 1/2 gram.  Patient denies H/I or AVH.  Patient admits to using alcohol every other day consuming 6 to 8 beers per use. Last drink was this date when patient reported consuming 2-12 oz beers. Patient [facility] with the last one on 01/01/16 followed by an admission on 10/15/15 and again on 09/11/15. Patient states he has not followed up with after care due to continuously relapsing. Several withdrawal symptoms reported this date include: nausea, diarrhea, increased anxiety and sweats. No history of seizures. Patient does have a history of black outs. Patient had one successful inpatient [facility]  in 2015 when he maintained his sobriety for over three months. Patient has suicidal ideations but no plan. Denies self mutilating behaviors. No family history of mental health illness or substance use. Patient has depressive symptoms including: loss of interest pleasures, fatigue, and hopelessness.  Patient is calm and cooperative. No history of violent or aggressive behaviors. No legal issues. No AVH's. Patient is time/place oriented. Case was staffed with Justin PollackLord DNP who recommended patient be monitored in the Observation Unit.  Diagnosis: Major Depressive Disorder, Recurrent, Severe, without psychotic symptoms; Polysubstance abuse severe   Past Medical History:  Past Medical History:  Diagnosis Date  . Anxiety   . Suicidal thoughts     History reviewed. No pertinent surgical history.  Family History: No family history on file.  Social History:  reports that he has been smoking.  He has been smoking about 0.50 packs per day. He has never used smokeless  tobacco. He reports that he drinks alcohol. He reports that he does not use drugs.  Additional Social History:  Alcohol / Drug Use Pain Medications: SEE MAR Prescriptions: SEE MAR Over the Counter: SEE MAR History of alcohol / drug use?: Yes Longest period of sobriety (when/how long): Unable to remember  Negative Consequences of Use: Financial, Personal relationships, Work / School Withdrawal Symptoms: Agitation, Weakness, Nausea / Vomiting, Fever / Chills, Irritability, Sweats Substance #1 Name of Substance 1: Heroin  1 - Age of First Use: "early 20's" 1 - Amount (size/oz): 1.5 months 1 - Frequency: daily for the past 1.5 month;  1 - Duration: on-going  1 - Last Use / Amount: 02/28/16 1/2 gram  Substance #2 Name of Substance 2: Alcohol  2 - Age of First Use: 30 yrs old  2 - Amount (size/oz): 6 to 8 beers 2 - Frequency: 4 times a week 2 - Duration: on-going  2 - Last Use / Amount: 02/27/16 patient stated he used 1/2 pint   CIWA: CIWA-Ar BP: 132/87 Pulse Rate: 94 Nausea and Vomiting: 2 Tactile Disturbances: very mild itching, pins and needles, burning or numbness Tremor: two Auditory Disturbances: not present Paroxysmal Sweats: no sweat visible Visual Disturbances: not present Anxiety: two Headache, Fullness in Head: moderate Agitation: normal activity Orientation and Clouding of Sensorium: oriented and can do serial additions CIWA-Ar Total: 10 COWS:    Allergies: No Known Allergies  Home Medications:  (Not in a hospital admission)  OB/GYN Status:  No LMP for male patient.  General Assessment Data Location of Assessment: [facility] ED TTS Assessment: In system Is this a  Tele or Face-to-Face Assessment?: Face-to-Face Is this an Initial Assessment or a Re-assessment for this encounter?: Initial Assessment Marital status: Single Maiden name: na Is patient pregnant?: No Pregnancy Status: No Living Arrangements: Alone Can pt return to current living arrangement?:  Yes Admission Status: Voluntary Is patient capable of signing voluntary admission?: Yes Referral Source: Self/Family/Friend Insurance type: Tax adviser Exam [facility] Walk-in ONLY) Medical Exam completed: Yes  Crisis Care Plan Living Arrangements: Alone Legal Guardian: Other: (na) Name of Psychiatrist: None Name of Therapist: None  Education Status Is patient currently in school?: No Current Grade: na Highest grade of school patient has completed: 12 Name of school: na Contact person: na  Risk to self with the past 6 months Suicidal Ideation: Yes-Currently Present (passive) Has patient been a risk to self within the past 6 months prior to admission? : Yes Suicidal Intent: No Has patient had any suicidal intent within the past 6 months prior to admission? : No Is patient at risk for suicide?: Yes Suicidal Plan?: No Has patient had any suicidal plan within the past 6 months prior to admission? : No Access to Means: No What has been your use of drugs/alcohol within the last 12 months?: current Previous Attempts/Gestures: Yes How many times?:  (pt was unsure) Other Self Harm Risks: none Triggers for Past Attempts: Unknown Intentional Self Injurious Behavior: None Family Suicide History: No Recent stressful life event(s): Other (Comment) (none noted) Persecutory voices/beliefs?: No Depression: Yes Depression Symptoms: Feeling worthless/self pity Substance abuse history and/or treatment for substance abuse?: Yes Suicide prevention information given to non-admitted patients: Not applicable  Risk to Others within the past 6 months Homicidal Ideation: No Does patient have any lifetime risk of violence toward others beyond the six months prior to admission? : No Thoughts of Harm to Others: No Current Homicidal Intent: No Current Homicidal Plan: No Access to Homicidal Means: No Identified Victim: na History of harm to others?: No Assessment of Violence: None  Noted Violent Behavior Description:  (none noted) Does patient have access to weapons?: No Criminal Charges Pending?: No Does patient have a court date: No Is patient on probation?: No  Psychosis Hallucinations: None noted Delusions: None noted  Mental Status Report Appearance/Hygiene: Unremarkable Eye Contact: Fair Motor Activity: Freedom of movement Speech: Logical/coherent Level of Consciousness: Alert Mood: Depressed Affect: Appropriate to circumstance Anxiety Level: None Thought Processes: Coherent, Relevant Judgement: Unimpaired Orientation: Person, Place, Time Obsessive Compulsive Thoughts/Behaviors: None  Cognitive Functioning Concentration: Normal Memory: Recent Intact, Remote Intact IQ: Average Insight: Poor Impulse Control: Poor Appetite: Poor Weight Loss: 0 Weight Gain: 0 Sleep: Decreased Total Hours of Sleep: 4 Vegetative Symptoms: None  ADLScreening [facility] Assessment Services) Patient's cognitive ability adequate to safely complete daily activities?: Yes Patient able to express need for assistance with ADLs?: Yes Independently performs ADLs?: Yes (appropriate for developmental age)  Prior Inpatient Therapy Prior Inpatient Therapy: Yes Prior Therapy Dates: 2017 Prior Therapy Facilty/Provider(s): [facility] Reason for Treatment: SA issues  Prior Outpatient Therapy Prior Outpatient Therapy: Yes Prior Therapy Dates: 2016 Prior Therapy Facilty/Provider(s): [facility] Reason for Treatment: SA use Does patient have an ACCT team?: No Does patient have Intensive In-House Services?  : No Does patient have [facility] services? : No Does patient have P4CC services?: No  ADL Screening (condition at time of admission) Patient's cognitive ability adequate to safely complete daily activities?: Yes Is the patient deaf or have difficulty hearing?: No Does the patient have difficulty seeing, even when wearing glasses/contacts?: No Does the patient  have difficulty concentrating,  remembering, or making decisions?: No Patient able to express need for assistance with ADLs?: Yes Does the patient have difficulty dressing or bathing?: No Independently performs ADLs?: Yes (appropriate for developmental age) Does the patient have difficulty walking or climbing stairs?: No Weakness of Legs: None Weakness of Arms/Hands: None  Home Assistive Devices/Equipment Home Assistive Devices/Equipment: None  Therapy Consults (therapy consults require a physician order) PT Evaluation Needed: No OT Evalulation Needed: No SLP Evaluation Needed: No Abuse/Neglect Assessment (Assessment to be complete while patient is alone) Physical Abuse: Denies Verbal Abuse: Denies Sexual Abuse: Denies Exploitation of patient/patient's resources: Denies Self-Neglect: Denies Values / Beliefs Cultural Requests During Hospitalization: None Spiritual Requests During Hospitalization: None Consults Spiritual Care Consult Needed: No Social Work Consult Needed: No Merchant navy officer (For Healthcare) Does patient have an advance directive?: No Would patient like information on creating an advanced directive?: No - patient declined information    Additional Information 1:1 In Past 12 Months?: No CIRT Risk: No Elopement Risk: No Does patient have medical clearance?: Yes     Disposition: Case was staffed with Justin Pollack DNP who recommended patient be monitored in the Observation Unit.  Disposition Initial Assessment Completed for this Encounter: Yes Disposition of Patient: Other dispositions Type of inpatient treatment program: Adult Other disposition(s): Other (Comment) (Observation Unit)  On Site Evaluation by:   Reviewed with Physician:    Alfredia Ferguson 02/28/2016 5:42 PM

## 2016-02-28 NOTE — ED Triage Notes (Signed)
Pt states he has had suicidal thoughts all week, which became worse last night. 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 Pt states he has hx of seizures when withdrawing from alcohol and would like to be medically detoxed.

## 2016-02-28 NOTE — ED Notes (Signed)
TTS at bedside. 

## 2016-02-28 NOTE — ED Notes (Signed)
Pt admitted to room #39. Pt behavior cooperative, anxious. Reports he is at the hospital d/t "suicidal thoughts." Pt reports he is coming off alcohol and heroin. Pt denies HI. Denies AVH. Pt reports hx of SA years ago. Pt reports he drinks 1/5 liquor daily for months as well as heroin daily for months. Special checks q 15 mins in place for safety. Video monitoring in place. Will continue to monitor.

## 2016-02-28 NOTE — ED Notes (Signed)
SBAR Report received from previous nurse. Pt received calm and visible on unit. Pt denies current SI/ HI, A/V H, depression, anxiety, and pain at this time, and is otherwise stable. Pt reminded of camera surveillance, q 15 min rounds, and rules of the milieu. Attempting to find out if pt is cleared for OBS unit

## 2016-02-29 ENCOUNTER — Encounter (HOSPITAL_COMMUNITY): Payer: Self-pay | Admitting: *Deleted

## 2016-02-29 DIAGNOSIS — F1120 Opioid dependence, uncomplicated: Secondary | ICD-10-CM | POA: Diagnosis not present

## 2016-02-29 DIAGNOSIS — F329 Major depressive disorder, single episode, unspecified: Secondary | ICD-10-CM | POA: Diagnosis present

## 2016-02-29 DIAGNOSIS — F32A Depression, unspecified: Secondary | ICD-10-CM | POA: Diagnosis present

## 2016-02-29 MED ORDER — ACETAMINOPHEN 325 MG PO TABS
650.0000 mg | ORAL_TABLET | Freq: Four times a day (QID) | ORAL | Status: DC | PRN
  Administered 2016-03-01: 650 mg via ORAL
  Filled 2016-02-29: qty 2

## 2016-02-29 MED ORDER — DICYCLOMINE HCL 20 MG PO TABS
20.0000 mg | ORAL_TABLET | Freq: Four times a day (QID) | ORAL | Status: DC | PRN
  Administered 2016-02-29 – 2016-03-02 (×6): 20 mg via ORAL
  Filled 2016-02-29 (×6): qty 1

## 2016-02-29 MED ORDER — HYDROXYZINE HCL 50 MG PO TABS
ORAL_TABLET | ORAL | Status: AC
  Filled 2016-02-29: qty 1

## 2016-02-29 MED ORDER — MAGNESIUM HYDROXIDE 400 MG/5ML PO SUSP: 30.0000 mL | Freq: Every day | ORAL | Status: DC | PRN

## 2016-02-29 MED ORDER — CLONIDINE HCL 0.1 MG PO TABS
0.1000 mg | ORAL_TABLET | Freq: Four times a day (QID) | ORAL | Status: AC
  Administered 2016-02-29 – 2016-03-01 (×6): 0.1 mg via ORAL
  Filled 2016-02-29 (×9): qty 1

## 2016-02-29 MED ORDER — CLONIDINE HCL 0.1 MG PO TABS
0.1000 mg | ORAL_TABLET | Freq: Every day | ORAL | Status: DC
  Administered 2016-03-02: 0.1 mg via ORAL
  Filled 2016-02-29 (×2): qty 1

## 2016-02-29 MED ORDER — IBUPROFEN 400 MG PO TABS
400.0000 mg | ORAL_TABLET | Freq: Four times a day (QID) | ORAL | Status: DC | PRN
  Administered 2016-03-01: 400 mg via ORAL
  Filled 2016-02-29: qty 1

## 2016-02-29 MED ORDER — GABAPENTIN 300 MG PO CAPS
300.0000 mg | ORAL_CAPSULE | Freq: Three times a day (TID) | ORAL | Status: DC
  Administered 2016-02-29 – 2016-03-02 (×6): 300 mg via ORAL
  Filled 2016-02-29 (×10): qty 1

## 2016-02-29 MED ORDER — CLONIDINE HCL 0.1 MG PO TABS
0.1000 mg | ORAL_TABLET | ORAL | Status: DC
  Administered 2016-03-02 – 2016-03-03 (×2): 0.1 mg via ORAL
  Filled 2016-02-29 (×4): qty 1

## 2016-02-29 MED ORDER — CLONIDINE HCL 0.1 MG PO TABS
0.2000 mg | ORAL_TABLET | ORAL | Status: AC
Start: 2016-02-29 — End: 2016-02-29
  Administered 2016-02-29: 0.2 mg via ORAL

## 2016-02-29 MED ORDER — HYDROXYZINE HCL 50 MG PO TABS
50.0000 mg | ORAL_TABLET | Freq: Four times a day (QID) | ORAL | Status: DC | PRN
  Administered 2016-02-29 – 2016-03-03 (×5): 50 mg via ORAL
  Filled 2016-02-29 (×5): qty 1

## 2016-02-29 MED ORDER — LAMOTRIGINE 25 MG PO TABS
25.0000 mg | ORAL_TABLET | Freq: Every day | ORAL | Status: DC
  Administered 2016-02-29 – 2016-03-03 (×4): 25 mg via ORAL
  Filled 2016-02-29 (×6): qty 1

## 2016-02-29 MED ORDER — METHOCARBAMOL 500 MG PO TABS
500.0000 mg | ORAL_TABLET | Freq: Three times a day (TID) | ORAL | Status: DC | PRN
  Administered 2016-02-29 – 2016-03-03 (×7): 500 mg via ORAL
  Filled 2016-02-29 (×7): qty 1

## 2016-02-29 MED ORDER — DICYCLOMINE HCL 20 MG PO TABS
ORAL_TABLET | ORAL | Status: AC
  Filled 2016-02-29: qty 1

## 2016-02-29 MED ORDER — ALUM & MAG HYDROXIDE-SIMETH 200-200-20 MG/5ML PO SUSP: 30.0000 mL | ORAL | Status: DC | PRN

## 2016-02-29 MED ORDER — LORAZEPAM 1 MG PO TABS
1.0000 mg | ORAL_TABLET | Freq: Three times a day (TID) | ORAL | Status: DC | PRN
  Administered 2016-02-29 – 2016-03-02 (×4): 1 mg via ORAL
  Filled 2016-02-29 (×4): qty 1

## 2016-02-29 MED ORDER — ONDANSETRON 4 MG PO TBDP
4.0000 mg | ORAL_TABLET | Freq: Four times a day (QID) | ORAL | Status: DC | PRN
Start: 2016-02-29 — End: 2016-03-03
  Administered 2016-02-29 – 2016-03-02 (×3): 4 mg via ORAL
  Filled 2016-02-29 (×3): qty 1

## 2016-02-29 MED ORDER — SERTRALINE HCL 100 MG PO TABS
200.0000 mg | ORAL_TABLET | Freq: Every day | ORAL | Status: DC
  Administered 2016-02-29 – 2016-03-03 (×4): 200 mg via ORAL
  Filled 2016-02-29 (×6): qty 2

## 2016-02-29 MED ORDER — NICOTINE POLACRILEX 2 MG MT GUM
2.0000 mg | CHEWING_GUM | OROMUCOSAL | Status: DC | PRN
  Administered 2016-02-29 – 2016-03-02 (×5): 2 mg via ORAL
  Filled 2016-02-29 (×5): qty 1

## 2016-02-29 MED ORDER — TRAZODONE HCL 100 MG PO TABS
100.0000 mg | ORAL_TABLET | Freq: Every evening | ORAL | Status: DC | PRN
  Administered 2016-02-29 – 2016-03-02 (×3): 100 mg via ORAL
  Filled 2016-02-29 (×3): qty 1

## 2016-02-29 MED ORDER — DICYCLOMINE HCL 20 MG PO TABS
20.0000 mg | ORAL_TABLET | ORAL | Status: AC
  Administered 2016-02-29: 20 mg via ORAL

## 2016-02-29 MED ORDER — METHOCARBAMOL 750 MG PO TABS
750.0000 mg | ORAL_TABLET | ORAL | Status: AC
  Administered 2016-02-29: 750 mg via ORAL

## 2016-02-29 MED ORDER — HYDROXYZINE HCL 50 MG PO TABS
50.0000 mg | ORAL_TABLET | ORAL | Status: AC
  Administered 2016-02-29: 50 mg via ORAL

## 2016-02-29 MED ORDER — NAPROXEN 500 MG PO TABS
500.0000 mg | ORAL_TABLET | Freq: Two times a day (BID) | ORAL | Status: DC | PRN
  Administered 2016-02-29 – 2016-03-02 (×3): 500 mg via ORAL
  Filled 2016-02-29 (×3): qty 1

## 2016-02-29 MED ORDER — CLONIDINE HCL 0.1 MG PO TABS
ORAL_TABLET | ORAL | Status: AC
  Filled 2016-02-29: qty 2

## 2016-02-29 MED ORDER — METHOCARBAMOL 750 MG PO TABS
ORAL_TABLET | ORAL | Status: AC
  Filled 2016-02-29: qty 1

## 2016-02-29 MED ORDER — LOPERAMIDE HCL 2 MG PO CAPS
2.0000 mg | ORAL_CAPSULE | ORAL | Status: DC | PRN
  Administered 2016-03-01: 2 mg via ORAL
  Filled 2016-02-29: qty 1

## 2016-02-29 NOTE — H&P (Signed)
[facility] Admission Assessment Adult  Patient Identification: Justin Fisher MRN:  [id number]  Date of Evaluation:  02/29/2016  Chief Complaint:  Patient states "I feel sick because I have been using heroin on a daily basis for the last month."  Principal Diagnosis: Heroin use disorder, moderate, dependence ([place])  Diagnosis:   Patient Active Problem List   Diagnosis Date Noted  . MDD (major depressive disorder) ([place]) [F32.9] 02/29/2016  . Heroin use disorder, moderate, dependence ([place]) [F11.20] 01/01/2016  . Alcohol-induced depressive disorder with moderate or severe use disorder with onset during intoxication ([place]) [F10.24, F10.229, F32.89] 10/15/2015  . MDD (major depressive disorder), recurrent severe, without psychosis ([place]) [F33.2] 09/11/2015  . Alcohol use disorder, moderate, dependence ([place]) [F10.20] 09/11/2015   History of Present Illness:Justin Fisher is an 30 y.o. male who presented to [facility] requesting detox from heroin. Patient stared using Heroin during his early 20's. 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 Patient has used Heroin consistently over the past  month. He reports using .5 gram daily and last used yesterday. His UDS is positive for opiates and cocaine.  He does admit consuming alcohol every other day. He drinks 6 to 8 beers per use.  Patient's BAL is negative. Several withdrawal symptoms are reported today: nausea, diarrhea, increased anxiety, flu like symptoms, chills, hot flashes and sweats. No history of seizures. Patient does have a history of black outs. Patient has participated in [facility] for substance use at [facility] and [facility]. He was most recently in the Observation Unit on 01/01/2016. He has also received treatment at [facility] 07/2015 and [facility] 3x's.   Patient has suicidal ideations. No plan or intent. He has a previous history of cutting wrist and eating rat poison. Trigger for previous attempt  was substance abuse. Today's trigger for the current suicidal thoughts are also related to his substance abuse and withdrawal symptoms. Denies self mutilating behaviors. No family history of mental health illness or substance use. Patient has depressive symptoms including: loss of interest pleasures, fatigue, and hopelessness.   No history of abuse. No support systems. No HI. Calm and cooperative. No history of violent or aggressive behaviors. No legal issues. No AVH's. Patient does not appear to be responding to internal stimuli. He does appear restless during the assessment and is observed requesting medications to help with the withdrawal per the clonidine protocol.   Associated Signs/Symptoms:  Depression Symptoms:  Depressed mood, Hopelessness, Passive suicidal ideation with no plan  (Hypo) Manic Symptoms:  Denies  Anxiety Symptoms:  Excessive Worry, Panic Symptoms,   Psychotic Symptoms:  Denies  PTSD Symptoms: Denies  Total Time spent with patient: 30 minutes  Past Psychiatric History: MDD, Alcohol abuse, Reports remission of heroine abuse  Is the patient at risk to self? Yes.    Has the patient been a risk to self in the past 6 months? Yes.    Has the patient been a risk to self within the distant past? Yes Is the patient a risk to others? No Has the patient been a risk to others in the past 6 months? No Has the patient been a risk to others within the distant past? No  Prior Inpatient Therapy: Yes, [place], [facility], [place], [place] Prior Outpatient Therapy: Yes but recently missed his appointment with the [facility]  Alcohol Screening:    Substance Abuse History in the last 12 months:  Yes.    Consequences of Substance Abuse: Medical Consequences:  Liver damage, Possible death by overdose Legal Consequences:  Arrests, jail time, Loss of driving privilege. Family Consequences:  Family discord, divorce and or separation.  Previous Psychotropic Medications: Yes  (Lithium Carbonate), Effexor, Neurontin   Psychological Evaluations: Yes   Past Medical History:  Past Medical History:  Diagnosis Date  . Anxiety   . Suicidal thoughts    History reviewed. No pertinent surgical history.  Family History: History reviewed. No pertinent family history.  Tobacco Screening: Current smoker requesting nicotine gum   Social History:  History  Alcohol Use  . Yes     History  Drug Use No    Additional Social History:  Allergies:  No Known Allergies  Lab Results:  Results for orders placed or performed during the hospital encounter of 02/28/16 (from the past 48 hour(s))  Comprehensive metabolic panel     Status: Abnormal   Collection Time: 02/28/16  3:54 PM  Result Value Ref Range   Sodium 137 135 - 145 mmol/L   Potassium 4.0 3.5 - 5.1 mmol/L   Chloride 107 101 - 111 mmol/L   CO2 23 22 - 32 mmol/L   Glucose, Bld 128 (H) 65 - 99 mg/dL   BUN 9 6 - 20 mg/dL   Creatinine, Ser 0.79 0.61 - 1.24 mg/dL   Calcium 9.4 8.9 - 10.3 mg/dL   Total Protein 7.8 6.5 - 8.1 g/dL   Albumin 4.5 3.5 - 5.0 g/dL   AST 20 15 - 41 U/L   ALT 19 17 - 63 U/L   Alkaline Phosphatase 85 38 - 126 U/L   Total Bilirubin 0.7 0.3 - 1.2 mg/dL   GFR calc non Af Amer >60 >60 mL/min   GFR calc Af Amer >60 >60 mL/min    Comment: (NOTE) The eGFR has been calculated using the CKD EPI equation. This calculation has not been validated in all clinical situations. eGFR's persistently <60 mL/min signify possible Chronic Kidney Disease.    Anion gap 7 5 - 15  cbc     Status: Abnormal   Collection Time: 02/28/16  3:54 PM  Result Value Ref Range   WBC 11.7 (H) 4.0 - 10.5 K/uL   RBC 5.33 4.22 - 5.81 MIL/uL   Hemoglobin 14.9 13.0 - 17.0 g/dL   HCT 42.6 39.0 - 52.0 %   MCV 79.9 78.0 - 100.0 fL   MCH 28.0 26.0 - 34.0 pg   MCHC 35.0 30.0 - 36.0 g/dL   RDW 13.7 11.5 - 15.5 %   Platelets 409 (H) 150 - 400 K/uL  Ethanol     Status: None   Collection Time: 02/28/16  3:56 PM  Result  Value Ref Range   Alcohol, Ethyl (B) <5 <5 mg/dL    Comment:        LOWEST DETECTABLE LIMIT FOR SERUM ALCOHOL IS 5 mg/dL FOR MEDICAL PURPOSES ONLY   Rapid urine drug screen (hospital performed)     Status: Abnormal   Collection Time: 02/28/16  6:59 PM  Result Value Ref Range   Opiates POSITIVE (A) NONE DETECTED   Cocaine POSITIVE (A) NONE DETECTED   Benzodiazepines NONE DETECTED NONE DETECTED   Amphetamines NONE DETECTED NONE DETECTED   Tetrahydrocannabinol NONE DETECTED NONE DETECTED   Barbiturates NONE DETECTED NONE DETECTED    Comment:        DRUG SCREEN FOR MEDICAL PURPOSES ONLY.  IF CONFIRMATION IS NEEDED FOR ANY PURPOSE, NOTIFY LAB WITHIN 5 DAYS.        LOWEST DETECTABLE  LIMITS FOR URINE DRUG SCREEN Drug Class       Cutoff (ng/mL) Amphetamine      1000 Barbiturate      200 Benzodiazepine   892 Tricyclics       119 Opiates          300 Cocaine          300 THC              50    Blood Alcohol level:  Lab Results  Component Value Date   ETH <5 02/28/2016   ETH <5 41/74/0814   Metabolic Disorder Labs:  Lab Results  Component Value Date   HGBA1C 6.5 (H) 09/12/2015   MPG 140 09/12/2015   No results found for: PROLACTIN Lab Results  Component Value Date   CHOL 215 (H) 09/12/2015   TRIG 275 (H) 09/12/2015   HDL 32 (L) 09/12/2015   CHOLHDL 6.7 09/12/2015   VLDL 55 (H) 09/12/2015   LDLCALC 128 (H) 09/12/2015    Current Medications: Current Facility-Administered Medications  Medication Dose Route Frequency Provider Last Rate Last Dose  . acetaminophen (TYLENOL) tablet 650 mg  650 mg Oral Q6H PRN Niel Hummer, NP      . alum & mag hydroxide-simeth (MAALOX/MYLANTA) 200-200-20 MG/5ML suspension 30 mL  30 mL Oral Q4H PRN Niel Hummer, NP      . cloNIDine (CATAPRES) tablet 0.1 mg  0.1 mg Oral QID Niel Hummer, NP   0.1 mg at 02/29/16 1158   Followed by  . [START ON 03/02/2016] cloNIDine (CATAPRES) tablet 0.1 mg  0.1 mg Oral BH-qamhs Niel Hummer, NP        Followed by  . [START ON 03/04/2016] cloNIDine (CATAPRES) tablet 0.1 mg  0.1 mg Oral QAC breakfast Niel Hummer, NP      . dicyclomine (BENTYL) tablet 20 mg  20 mg Oral Q6H PRN Niel Hummer, NP   20 mg at 02/29/16 0919  . gabapentin (NEURONTIN) capsule 300 mg  300 mg Oral TID Niel Hummer, NP   300 mg at 02/29/16 1158  . hydrOXYzine (ATARAX/VISTARIL) tablet 50 mg  50 mg Oral Q6H PRN Niel Hummer, NP   50 mg at 02/29/16 0919  . ibuprofen (ADVIL,MOTRIN) tablet 400 mg  400 mg Oral Q6H PRN Niel Hummer, NP      . lamoTRIgine (LAMICTAL) tablet 25 mg  25 mg Oral Daily Niel Hummer, NP   25 mg at 02/29/16 1158  . loperamide (IMODIUM) capsule 2-4 mg  2-4 mg Oral PRN Niel Hummer, NP      . LORazepam (ATIVAN) tablet 1 mg  1 mg Oral Q8H PRN Niel Hummer, NP      . magnesium hydroxide (MILK OF MAGNESIA) suspension 30 mL  30 mL Oral Daily PRN Niel Hummer, NP      . methocarbamol (ROBAXIN) tablet 500 mg  500 mg Oral Q8H PRN Niel Hummer, NP   500 mg at 02/29/16 4818  . naproxen (NAPROSYN) tablet 500 mg  500 mg Oral BID PRN Niel Hummer, NP      . ondansetron (ZOFRAN-ODT) disintegrating tablet 4 mg  4 mg Oral Q6H PRN Niel Hummer, NP   4 mg at 02/29/16 0919  . sertraline (ZOLOFT) tablet 200 mg  200 mg Oral Daily Niel Hummer, NP   200 mg at 02/29/16 5631  . traZODone (DESYREL) tablet 100 mg  100 mg Oral  QHS PRN Niel Hummer, NP       PTA Medications: Prescriptions Prior to Admission  Medication Sig Dispense Refill Last Dose  . gabapentin (NEURONTIN) 400 MG capsule Take 1 capsule (400 mg total) by mouth 3 (three) times daily. (Patient not taking: Reported on 02/28/2016) 90 capsule 0 Not Taking at Unknown time  . gabapentin (NEURONTIN) 800 MG tablet Take 800 mg by mouth 3 (three) times daily.   02/27/2016 at Unknown time  . hydrOXYzine (VISTARIL) 50 MG capsule Take 1 capsule (50 mg total) by mouth every 6 (six) hours as needed for anxiety. 30 capsule 0 unknown  . ibuprofen (ADVIL,MOTRIN) 200 MG  tablet Take 400 mg by mouth every 6 (six) hours as needed for headache.   unknown  . lamoTRIgine (LAMICTAL) 100 MG tablet Take 1 tablet (100 mg total) by mouth daily. For mood stabilization 30 tablet 0 02/27/2016 at 0800  . sertraline (ZOLOFT) 100 MG tablet Take 2 tablets (200 mg total) by mouth daily. For depression 60 tablet 0 02/27/2016 at Unknown time  . traZODone (DESYREL) 100 MG tablet Take 1 tablet (100 mg total) by mouth at bedtime as needed and may repeat dose one time if needed for sleep. (Patient taking differently: Take 100 mg by mouth at bedtime. ) 30 tablet 0 02/27/2016 at Unknown time   Musculoskeletal: Strength & Muscle Tone: within normal limits Gait & Station: normal Patient leans: N/A  Psychiatric Specialty Exam: Physical Exam  Genitourinary:  Genitourinary Comments: Denies any issues in this area  Psychiatric: His speech is normal. His mood appears anxious. His affect is angry and inappropriate. His affect is not blunt and not labile. He is agitated and aggressive. Cognition and memory are normal. He expresses impulsivity and inappropriate judgment. He exhibits a depressed mood.    Review of Systems  Constitutional: Positive for chills and diaphoresis.  HENT: Negative.   Eyes: Negative.   Respiratory: Negative.   Cardiovascular: Negative.   Gastrointestinal: Positive for nausea.  Genitourinary: Negative.   Musculoskeletal: Negative.   Skin: Negative.   Neurological: Negative.   Endo/Heme/Allergies: Negative.   Psychiatric/Behavioral: Positive for depression, substance abuse (Alcoholism,) and suicidal ideas. The patient is nervous/anxious and has insomnia.     Blood pressure 120/73, pulse 74, temperature 97.5 F (36.4 C), temperature source Oral, resp. rate 20, height 5' 7"  (1.702 m), weight 104.3 kg (230 lb), SpO2 99 %.Body mass index is 36.02 kg/m.  General Appearance: Disheveled  Eye Contact::  Minimal  Speech:  Clear and Coherent  Volume:  Normal  Mood:   Anxious and Depressed  Affect:  Congruent  Thought Process:  Coherent and Goal Directed  Orientation:  Full (Time, Place, and Person)  Thought Content:  Symptoms, worries, concerns   Suicidal Thoughts:  Yes.  without intent/plan  Homicidal Thoughts:  No  Memory:  Immediate;   Good Recent;   Good Remote;   Good  Judgement:  Impaired  Insight:  Lacking  Psychomotor Activity:  Restlessness  Concentration:  Good  Recall:  Good  Fund of Knowledge:Fair  Language: Good  Akathisia:  No  Handed:  Right  AIMS (if indicated):     Assets:  Communication Skills Desire for Improvement Financial Resources/Insurance Leisure Time Physical Health Resilience Social Support Vocational/Educational  ADL's:  Intact  Cognition: WNL  Sleep:      Treatment Plan/Recommendations: 1. Admit to [facility] [facility] for crisis management and stabilization, estimated length of stay 24-48 hours.   2. Medication management to reduce current  symptoms to base line and improve the patient's overall level of functioning. Will resume home medications to include Zoloft 200 mg daily for depression, Lamictal 25 mg daily for mood control, and Neurontin 300 mg TID for anxiety  3. Treat health problems as indicated. Start on clonidine protocol for opiate detox. Start Ativan 1 mg every eight hours prn due to report of alcohol abuse.  4. Develop treatment plan to decrease risk of relapse upon discharge and the need for readmission.  5. Psycho-social education regarding relapse prevention and self care.  6. Health care follow up as needed for medical problems.  7. Review, reconcile, and reinstate any pertinent home medications for other health issues where appropriate.  Observation Level/Precautions:  Continuous Observation  Laboratory:  CBC Chemistry Profile UDS  Psychotherapy: Individual for substance abuse counseling   Consultations: None  Discharge Concerns: Compliance with follow up recommendations  Estimated  LOS: 24-48 hours  Other:  Needs appointment for outpatient follow up, Observation staff to explore residential treatment options    Elmarie Shiley, NP, 8/26/201712:03 PM   Agree with  NP note and assessment

## 2016-02-29 NOTE — Progress Notes (Signed)
Admission Note:  D: Patient si a 30 year old male [facility] from alcohol and Heroin with passive SI without a plan. On admission, patient appear anxious and mildly irritated. Patient reports generalized body pain and withdrawals symptoms like anxiety, hot/cold flashes. Denies SI stated "I am afraid of hurting myself".  Denies HI, AH/VH at this time. Pt stated he has no Hx of physical, sexual ar emotional at any given time of his life.   A: Skin/body search done. No contraband found. No tattoos seen. Noted some wound-like- cigarette burns on both hands. Patient stated that those wounds are as a result of picking his skin . POC and unit policies explained and understanding verbalized. Consents obtained. Accepted food and fluids offered. R: Patient had no additional questions or concerns.

## 2016-02-29 NOTE — Progress Notes (Signed)
Patient ID: Justin McardleDavid Fisher, male   DOB: Mar 29, 1986, 30 y.o.   MRN: [id number]   Pt has been having severe withdrawal symptoms on the unit today, he has required several prn medications to help with withdrawals. Pt reported that he felt like he needed to be admitted to the unit. Pt was advised that he would be reassessed tomorrow and that the provider would determine at that time if he was going to be discharged or admitted to the unit. Pt reported that he was having stomach cramps, body aches, N/V, anxiety and agitation. Pt reported that his depression was a 10, his hopelessness was a 10, and his anxiety was a 10. Will continue to monitor patient.

## 2016-02-29 NOTE — Progress Notes (Signed)
Admission Note:  D: Patient si a 29 year old male admitted to [facility] requesting detox from alcohol and Heroin with passive SI without a plan. On admission, patient appear anxious and mildly irritated. Patient reports generalized body pain and withdrawals symptoms like anxiety, hot/cold flashes. Denies SI stated "I am afraid of hurting myself".  Denies HI, AH/VH at this time. Pt stated he has no Hx of physical, sexual ar emotional at any given time of his life.   A: Skin/body search done. No contraband found. No tattoos seen. Noted some wound-like- cigarette burns on both hands. Patient stated that those wounds are as a result of picking his skin . POC and unit policies explained and understanding verbalized. Consents obtained. Accepted food and fluids offered. R: Patient had no additional questions or concerns.   

## 2016-03-01 DIAGNOSIS — F10239 Alcohol dependence with withdrawal, unspecified: Secondary | ICD-10-CM | POA: Diagnosis present

## 2016-03-01 DIAGNOSIS — Z915 Personal history of self-harm: Secondary | ICD-10-CM | POA: Diagnosis not present

## 2016-03-01 DIAGNOSIS — F17200 Nicotine dependence, unspecified, uncomplicated: Secondary | ICD-10-CM | POA: Diagnosis present

## 2016-03-01 DIAGNOSIS — G4700 Insomnia, unspecified: Secondary | ICD-10-CM | POA: Diagnosis present

## 2016-03-01 DIAGNOSIS — F1120 Opioid dependence, uncomplicated: Secondary | ICD-10-CM | POA: Diagnosis present

## 2016-03-01 DIAGNOSIS — R45851 Suicidal ideations: Secondary | ICD-10-CM | POA: Diagnosis present

## 2016-03-01 DIAGNOSIS — F419 Anxiety disorder, unspecified: Secondary | ICD-10-CM | POA: Diagnosis present

## 2016-03-01 DIAGNOSIS — F332 Major depressive disorder, recurrent severe without psychotic features: Secondary | ICD-10-CM | POA: Diagnosis present

## 2016-03-01 DIAGNOSIS — F17210 Nicotine dependence, cigarettes, uncomplicated: Secondary | ICD-10-CM | POA: Diagnosis not present

## 2016-03-01 DIAGNOSIS — F1410 Cocaine abuse, uncomplicated: Secondary | ICD-10-CM | POA: Diagnosis present

## 2016-03-01 DIAGNOSIS — Z818 Family history of other mental and behavioral disorders: Secondary | ICD-10-CM | POA: Diagnosis not present

## 2016-03-01 MED ORDER — NICOTINE 21 MG/24HR TD PT24
MEDICATED_PATCH | TRANSDERMAL | Status: AC
  Filled 2016-03-01: qty 1

## 2016-03-01 MED ORDER — NICOTINE 21 MG/24HR TD PT24
21.0000 mg | MEDICATED_PATCH | Freq: Every day | TRANSDERMAL | Status: DC
  Administered 2016-03-01: 21 mg via TRANSDERMAL
  Filled 2016-03-01 (×2): qty 1

## 2016-03-01 NOTE — Progress Notes (Signed)
D: Pt denies SI/HI/AV. Pt is pleasant and cooperative. Pt did request prn trazodone for sleep with good results noted. A: Pt was offered support and encouragement. Pt was given scheduled medications.Constant observation for safety and patient remains safe on unit.  R:Pt interacts well with staff. Pt is taking medication. Pt has no complaints.Pt receptive to treatment and safety maintained on unit.

## 2016-03-01 NOTE — Progress Notes (Signed)
Patient re-assessed in the [facility]Observation Unit today. He reports the presence of continued withdrawal symptoms. Patient states "I feel worse today. My body hurts so bad and I have no energy. The prn medications have helped some. I was using heroin for three months straight. I feel like I would hurt myself if I was not here. I have thought about crashing my car because I feel so bad. I could also find a gun." Discussed with Dr. Jama Flavorsobos. Patient meets criteria for the adult unit. He has been accepted to 306-1 and will be transferred later today for continued care.

## 2016-03-01 NOTE — Progress Notes (Signed)
Patient ID: Justin McardleDavid Heffern, male   DOB: 09-10-1985, 30 y.o.   MRN: [id number] Transfer note to adult unit-Seen by Charisse MarchLaura D. NP earlier this am and she has determined he will continue his detox on the adult unit and is moving to room 306-1. He has slept most of his time on the OBS unit due to his detoxing from Heroin and cocaine. He has had numerous prn meds for his complaints of headache, nausea, and anxiety. He states he feels like "crap"'.Gave him Robaxin and Ativan prn for his withdrawal sx and complaints. He is understanding and accepting of the transfer to the adult unit. Reported off to [employer] RN.

## 2016-03-01 NOTE — ED Provider Notes (Signed)
[facility] Provider Note   CSN: [id number] Arrival date & time: 02/28/16  1529     History   Chief Complaint Chief Complaint  Patient presents with  . Suicidal  . Alcohol Problem  . Drug Problem    HPI Justin Fisher is a 30 y.o. male.  HPI Patient presents to the emergency department with suicidal ideation and alcohol abuse.  Patient states he drinks a fifth of liquor a day.  Patient states that he has been having increasing suicidal thoughts over the last few days.  Patient states nothing seems make the condition better or worse.  Patient denies hallucinations. The patient denies chest pain, shortness of breath, headache,blurred vision, neck pain, fever, cough, weakness, numbness, dizziness, anorexia, edema, abdominal pain, nausea, vomiting, diarrhea, rash, back pain, dysuria, hematemesis, bloody stool, near syncope, or syncope. Past Medical History:  Diagnosis Date  . Anxiety   . Suicidal thoughts     Patient Active Problem List   Diagnosis Date Noted  . MDD (major depressive disorder) ([facility]) 02/29/2016  . 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 MDD (major depressive disorder), recurrent severe, without psychosis ([facility]) 09/11/2015  . Alcohol use disorder, moderate, dependence ([facility]) 09/11/2015    History reviewed. No pertinent surgical history.     Home Medications    Prior to Admission medications   Medication Sig Start Date End Date Taking? Authorizing Provider  gabapentin (NEURONTIN) 800 MG tablet Take 800 mg by mouth 3 (three) times daily.   Yes Historical Provider, MD  lamoTRIgine (LAMICTAL) 100 MG tablet Take 1 tablet (100 mg total) by mouth daily. For mood stabilization 01/02/16  Yes Truman Hayward, FNP  sertraline (ZOLOFT) 100 MG tablet Take 2 tablets (200 mg total) by mouth daily. For depression  01/02/16  Yes Truman Hayward, FNP  traZODone (DESYREL) 100 MG tablet Take 1 tablet (100 mg total) by mouth at bedtime as needed and may repeat dose one time if needed for sleep. Patient taking differently: Take 100 mg by mouth at bedtime.  01/02/16  Yes Truman Hayward, FNP  gabapentin (NEURONTIN) 400 MG capsule Take 1 capsule (400 mg total) by mouth 3 (three) times daily. Patient not taking: Reported on 02/28/2016 01/02/16   Truman Hayward, FNP  hydrOXYzine (VISTARIL) 50 MG capsule Take 1 capsule (50 mg total) by mouth every 6 (six) hours as needed for anxiety. 01/02/16   Truman Hayward, FNP  ibuprofen (ADVIL,MOTRIN) 200 MG tablet Take 400 mg by mouth every 6 (six) hours as needed for headache.    Historical Provider, MD    Family History No family history on file.  Social History Social History  Substance Use Topics  . Smoking status: Current Every Day Smoker    Packs/day: 0.50  . Smokeless tobacco: Never Used  . Alcohol use Yes     Allergies   Review of patient's allergies indicates no known allergies.   Review of Systems Review of Systems All other systems negative except as documented in the HPI. All pertinent positives and negatives as reviewed in the HPI.   Physical Exam Updated Vital Signs BP 109/62 (BP Location: Right Arm)   Pulse 98   Temp 98 F (36.7 C) (Oral)   Resp 17   Ht 5\' 7"  (1.702 m)   Wt 104.3 kg   SpO2 100%   BMI 36.02 kg/m   Physical  Exam  Constitutional: He is oriented to person, place, and time. He appears well-developed and well-nourished. No distress.  HENT:  Head: Normocephalic and atraumatic.  Mouth/Throat: Oropharynx is clear and moist.  Eyes: Pupils are equal, round, and reactive to light.  Neck: Normal range of motion. Neck supple.  Cardiovascular: Normal rate, regular rhythm and normal heart sounds.  Exam reveals no gallop and no friction rub.   No murmur heard. Pulmonary/Chest: Effort normal and breath sounds normal. No respiratory  distress. He has no wheezes.  Abdominal: Soft. Bowel sounds are normal. He exhibits no distension. There is no tenderness.  Neurological: He is alert and oriented to person, place, and time. He exhibits normal muscle tone. Coordination normal.  Skin: Skin is warm and dry. No rash noted. No erythema.  Psychiatric: He has a normal mood and affect. His behavior is normal. He expresses suicidal ideation.  Nursing note and vitals reviewed.    ED Treatments / Results  Labs (all labs ordered are listed, but only abnormal results are displayed) Labs Reviewed  COMPREHENSIVE METABOLIC PANEL - Abnormal; Notable for the following:       Result Value   Glucose, Bld 128 (*)    All other components within normal limits  CBC - Abnormal; Notable for the following:    WBC 11.7 (*)    Platelets 409 (*)    All other components within normal limits  URINE RAPID DRUG SCREEN, HOSP PERFORMED - Abnormal; Notable for the following:    Opiates POSITIVE (*)    Cocaine POSITIVE (*)    All other components within normal limits  ETHANOL    EKG  EKG Interpretation None       Radiology No results found.  Procedures Procedures (including critical care time)  Medications Ordered in ED Medications - No data to display   Initial Impression / Assessment and Plan / ED Course  I have reviewed the triage vital signs and the nursing notes.  Pertinent labs & imaging results that were available during my care of the patient were reviewed by me and considered in my medical decision making (see chart for details).  Clinical Course    Patient will need TTS assessment for suicidal ideation  Final Clinical Impressions(s) / ED Diagnoses   Final diagnoses:  Suicidal ideations  Polysubstance abuse    New Prescriptions Discharge Medication List as of 02/28/2016  9:11 PM       Charlestine Nighthristopher Jaquesha Boroff, PA-C 03/01/16 0133    Samuel JesterKathleen McManus, DO 03/02/16 [id number]

## 2016-03-01 NOTE — Tx Team (Addendum)
Initial Treatment Plan 03/01/2016 4:06 PM Justin Fisher ZOX:[id number]    PATIENT STRESSORS: Medication change or noncompliance Substance abuse   PATIENT STRENGTHS: Ability for insight Capable of independent living Motivation for treatment/growth   PATIENT IDENTIFIED PROBLEMS: Substance Abuse  Suicide Risk                   DISCHARGE CRITERIA:  Improved stabilization in mood, thinking, and/or behavior Verbal commitment to aftercare and medication compliance  PRELIMINARY DISCHARGE PLAN: Attend 12-step recovery group Outpatient therapy  PATIENT/FAMILY INVOLVEMENT: This treatment plan has been presented to and reviewed with the patient, Justin Fisher.  The patient and family have been given the opportunity to ask questions and make suggestions.  Lauris PoagAndrea B Berdell Hostetler, RN 03/01/2016, 4:06 PM

## 2016-03-01 NOTE — Plan of Care (Signed)
Problem: Medication: Goal: Compliance with prescribed medication regimen will improve Outcome: Progressing Pt has been compliant with scheduled medications tonight.    

## 2016-03-01 NOTE — Progress Notes (Signed)
D: Pt was in the hallway upon initial approach.  Pt has anxious affect and mood.  He describes his day as "rough, rocky" because he is "coming off heroin."   Pt denies SI/HI, denies hallucinations, reports generalized pain of 7/10.  Pt has been visible in milieu interacting with peers and staff appropriately.  He reports withdrawal symptoms of "my body hurts, anxiety."  Pt attended evening group.   A: Introduced self to pt.  Met with pt and offered support and encouragement.  Actively listened to pt.  Medications administered per order.  PRN medication administered for pain and sleep. R: Pt is compliant with medications.  Pt verbally contracts for safety.  He reports he will inform staff of needs and concerns.  Will continue to monitor and assess.

## 2016-03-02 MED ORDER — CHLORDIAZEPOXIDE HCL 25 MG PO CAPS
25.0000 mg | ORAL_CAPSULE | Freq: Four times a day (QID) | ORAL | Status: DC | PRN
  Administered 2016-03-02: 25 mg via ORAL
  Filled 2016-03-02: qty 1

## 2016-03-02 MED ORDER — GABAPENTIN 400 MG PO CAPS
400.0000 mg | ORAL_CAPSULE | Freq: Three times a day (TID) | ORAL | Status: DC
  Administered 2016-03-02 – 2016-03-03 (×4): 400 mg via ORAL
  Filled 2016-03-02 (×9): qty 1

## 2016-03-02 NOTE — [facility] Group Notes (Signed)
[facility]  Date/Time: 03/02/2016  1:30pm  Type of Therapy and Topic:  Group Therapy:  Trust and Honesty  Participation Level:  Active  Description of Group:    In this group patients will be asked to explore value of being honest.  Patients will be guided to discuss their thoughts, feelings, and behaviors related to honesty and trusting in others. Patients will process together how trust and honesty relate to how we form relationships with peers, family members, and self. Each patient will be challenged to identify and express feelings of being vulnerable. Patients will discuss reasons why people are dishonest and identify alternative outcomes if one was truthful (to self or others).  This group will be process-oriented, with patients participating in exploration of their own experiences as well as giving and receiving support and challenge from other group members.  Therapeutic Goals: 1. Patient will identify why honesty is important to relationships and how honesty overall affects relationships.  2. Patient will identify a situation where they lied or were lied too and the  feelings, thought process, and behaviors surrounding the situation 3. Patient will identify the meaning of being vulnerable, how that feels, and how that correlates to being honest with self and others. 4. Patient will identify situations where they could have told the truth, but instead lied and explain reasons of dishonesty.  Summary of Patient Progress  Patient discussed how dishonesty and manipulation towards others, especially his father, is influenced by his addiction. He reports feeling little remorse for his dishonesty about his relapses, citing his motivation as not wanting to disappoint others and also so that he gets what he wants in order to fuel his addiction.   Therapeutic Modalities:   Cognitive Behavioral Therapy Solution Focused Therapy Motivational Interviewing Brief  Therapy   Samuella BruinKristin Sire Poet, LCSW Clinical Social Worker [facility] [phone]

## 2016-03-02 NOTE — Progress Notes (Signed)
Patient ID: Justin McardleDavid Schirm, male   DOB: 1986-07-01, 30 y.o.   MRN: [id number] D: Patient continues to complain of anxiety.  Patient denies any thoughts of self harm.  He continues to ask for several prns.  He refused to fill out self inventory.  Patient states his is having chills, irritability and agitation.  He presents with flat, blunted affect; anxious and depressed mood. A: Continue to monitor medication management and MD orders.  Safety checks completed every 15 minutes per protocol.  Offer support and encouragement as needed. R: Patient is receptive to staff; his behavior is appropriate.

## 2016-03-02 NOTE — Progress Notes (Signed)
Recreation Therapy Notes  Date: 03/02/16 Time: 0930 Location: [address] Dayroom  Group Topic: Stress Management  Goal Area(s) Addresses:  Patient will verbalize importance of using healthy stress management.  Patient will identify positive emotions associated with healthy stress management.   Intervention: Stress Management  Activity :  [place].  LRT introduced to the technique of guided imagery to the patients.  Patients were to follow along as LRT read script in order to participate in the the technique.  Education: Stress Management, Discharge Planning.   Education Outcome: Acknowledges edcuation/In group clarification offered/Needs additional education  Clinical Observations/Feedback: Pt did not attend group.    Justin Fisher, LRT/CTRS   Rosemary Pentecost A 03/02/2016 12:48 PM 

## 2016-03-02 NOTE — Tx Team (Signed)
Interdisciplinary Treatment and Diagnostic Plan Update  03/02/2016 Time of Session: 9:30am Justin Fisher MRN: [id number]  Principal Diagnosis: Heroin use disorder, moderate, dependence (HCC)  Secondary Diagnoses: Principal Problem:   Heroin use disorder, moderate, dependence (HCC) Active Problems:   MDD (major depressive disorder) (HCC)   Opiate dependence (HCC)   Current Medications:  Current Facility-Administered Medications  Medication Dose Route Frequency Provider Last Rate Last Dose  . acetaminophen (TYLENOL) tablet 650 mg  650 mg Oral Q6H PRN Thermon LeylandLaura A Davis, NP   650 mg at 03/01/16 1947  . alum & mag hydroxide-simeth (MAALOX/MYLANTA) 200-200-20 MG/5ML suspension 30 mL  30 mL Oral Q4H PRN Thermon LeylandLaura A Davis, NP      . cloNIDine (CATAPRES) tablet 0.1 mg  0.1 mg Oral BH-qamhs Thermon LeylandLaura A Davis, NP       Followed by  . [START ON 03/04/2016] cloNIDine (CATAPRES) tablet 0.1 mg  0.1 mg Oral QAC breakfast Thermon LeylandLaura A Davis, NP   0.1 mg at 03/02/16 0810  . dicyclomine (BENTYL) tablet 20 mg  20 mg Oral Q6H PRN Thermon LeylandLaura A Davis, NP   20 mg at 03/02/16 0606  . gabapentin (NEURONTIN) capsule 300 mg  300 mg Oral TID Thermon LeylandLaura A Davis, NP   300 mg at 03/02/16 0810  . hydrOXYzine (ATARAX/VISTARIL) tablet 50 mg  50 mg Oral Q6H PRN Thermon LeylandLaura A Davis, NP   50 mg at 03/01/16 1615  . ibuprofen (ADVIL,MOTRIN) tablet 400 mg  400 mg Oral Q6H PRN Thermon LeylandLaura A Davis, NP   400 mg at 03/01/16 1615  . lamoTRIgine (LAMICTAL) tablet 25 mg  25 mg Oral Daily Thermon LeylandLaura A Davis, NP   25 mg at 03/02/16 0809  . loperamide (IMODIUM) capsule 2-4 mg  2-4 mg Oral PRN Thermon LeylandLaura A Davis, NP   2 mg at 03/01/16 1615  . LORazepam (ATIVAN) tablet 1 mg  1 mg Oral Q8H PRN Thermon LeylandLaura A Davis, NP   1 mg at 03/02/16 0606  . magnesium hydroxide (MILK OF MAGNESIA) suspension 30 mL  30 mL Oral Daily PRN Thermon LeylandLaura A Davis, NP      . methocarbamol (ROBAXIN) tablet 500 mg  500 mg Oral Q8H PRN Thermon LeylandLaura A Davis, NP   500 mg at 03/02/16 0606  . naproxen (NAPROSYN) tablet 500 mg  500 mg  Oral BID PRN Thermon LeylandLaura A Davis, NP   500 mg at 03/01/16 1105  . nicotine polacrilex (NICORETTE) gum 2 mg  2 mg Oral PRN Thermon LeylandLaura A Davis, NP   2 mg at 03/02/16 0606  . ondansetron (ZOFRAN-ODT) disintegrating tablet 4 mg  4 mg Oral Q6H PRN Thermon LeylandLaura A Davis, NP   4 mg at 02/29/16 1459  . sertraline (ZOLOFT) tablet 200 mg  200 mg Oral Daily Thermon LeylandLaura A Davis, NP   200 mg at 03/02/16 0809  . traZODone (DESYREL) tablet 100 mg  100 mg Oral QHS PRN Thermon LeylandLaura A Davis, NP   100 mg at 03/01/16 2116   PTA Medications: Prescriptions Prior to Admission  Medication Sig Dispense Refill Last Dose  . gabapentin (NEURONTIN) 400 MG capsule Take 1 capsule (400 mg total) by mouth 3 (three) times daily. (Patient not taking: Reported on 02/28/2016) 90 capsule 0 Not Taking at Unknown time  . gabapentin (NEURONTIN) 800 MG tablet Take 800 mg by mouth 3 (three) times daily.   02/27/2016 at Unknown time  . hydrOXYzine (VISTARIL) 50 MG capsule Take 1 capsule (50 mg total) by mouth every 6 (six) hours as needed for anxiety. 30 capsule  0 unknown  . ibuprofen (ADVIL,MOTRIN) 200 MG tablet Take 400 mg by mouth every 6 (six) hours as needed for headache.   unknown  . lamoTRIgine (LAMICTAL) 100 MG tablet Take 1 tablet (100 mg total) by mouth daily. For mood stabilization 30 tablet 0 02/27/2016 at 0800  . sertraline (ZOLOFT) 100 MG tablet Take 2 tablets (200 mg total) by mouth daily. For depression 60 tablet 0 02/27/2016 at Unknown time  . traZODone (DESYREL) 100 MG tablet Take 1 tablet (100 mg total) by mouth at bedtime as needed and may repeat dose one time if needed for sleep. (Patient taking differently: Take 100 mg by mouth at bedtime. ) 30 tablet 0 02/27/2016 at Unknown time    Treatment Modalities: Medication Management, Group therapy, Case management,  1 to 1 session with clinician, Psychoeducation, Recreational therapy.   Physician Treatment Plan for Primary Diagnosis: Heroin use disorder, moderate, dependence (HCC) Long Term Goal(s):  Improvement in symptoms so as ready for discharge   Short Term Goals: Ability to identify changes in lifestyle to reduce recurrence of condition will improve, Ability to identify and develop effective coping behaviors will improve and Ability to identify triggers associated with substance abuse/mental health issues will improve  Medication Management: Evaluate patient's response, side effects, and tolerance of medication regimen.  Therapeutic Interventions: 1 to 1 sessions, Unit Group sessions and Medication administration.  Evaluation of Outcomes: Adequate for Discharge  Physician Treatment Plan for Secondary Diagnosis: Principal Problem:   Heroin use disorder, moderate, dependence (HCC) Active Problems:   MDD (major depressive disorder) (HCC)   Opiate dependence (HCC)  Long Term Goal(s): Improvement in symptoms so as ready for discharge  Short Term Goals: Ability to identify changes in lifestyle to reduce recurrence of condition will improve, Ability to identify and develop effective coping behaviors will improve and Ability to identify triggers associated with substance abuse/mental health issues will improve  Medication Management: Evaluate patient's response, side effects, and tolerance of medication regimen.  Therapeutic Interventions: 1 to 1 sessions, Unit Group sessions and Medication administration.  Evaluation of Outcomes: Adequate for Discharge   RN Treatment Plan for Primary Diagnosis: Heroin use disorder, moderate, dependence (HCC) Long Term Goal(s): Knowledge of disease and therapeutic regimen to maintain health will improve  Short Term Goals: Ability to verbalize feelings will improve, Ability to identify and develop effective coping behaviors will improve and Compliance with prescribed medications will improve  Medication Management: RN will administer medications as ordered by provider, will assess and evaluate patient's response and provide education to patient for  prescribed medication. RN will report any adverse and/or side effects to prescribing provider.  Therapeutic Interventions: 1 on 1 counseling sessions, Psychoeducation, Medication administration, Evaluate responses to treatment, Monitor vital signs and CBGs as ordered, Perform/monitor CIWA, COWS, AIMS and Fall Risk screenings as ordered, Perform wound care treatments as ordered.  Evaluation of Outcomes: Adequate for Discharge   LCSW Treatment Plan for Primary Diagnosis: Heroin use disorder, moderate, dependence (HCC) Long Term Goal(s): Safe transition to appropriate next level of care at discharge, Engage patient in therapeutic group addressing interpersonal concerns.  Short Term Goals: Engage patient in aftercare planning with referrals and resources, Facilitate patient progression through stages of change regarding substance use diagnoses and concerns and Increase skills for wellness and recovery  Therapeutic Interventions: Assess for all discharge needs, 1 to 1 time with Social worker, Explore available resources and support systems, Assess for adequacy in community support network, Educate family and significant other(s) on suicide prevention,  Complete Psychosocial Assessment, Interpersonal group therapy.  Evaluation of Outcomes: Adequate for Discharge   Progress in Treatment :  Attending groups: Continuing to assess  Participating in groups: Continuing to assess  Taking medication as prescribed: Yes, MD continuing to assess for appropriate medication regimen  Toleration medication: Yes  Family/Significant other contact made: No, patient has declined for collateral contact   Patient understands diagnosis: Yes  Discussing patient identified problems/goals with staff: Yes  Medical problems stabilized or resolved: Yes  Denies suicidal/homicidal ideation: Yes, denies  Issues/concerns per patient self-inventory: None reported  Other: N/A  New problem(s) identified: None  reported at this time    New Short Term/Long Term Goal(s): None at this time    Discharge Plan or Barriers: Patient plans to return home to follow up with outpatient services.     Reason for Continuation of Hospitalization: Anxiety Depression Hallucinations Mania Medication stabilization Suicidal ideation Withdrawal symptoms   Estimated Length of Stay: 1-2 days    Attendees:  Patient:   Physician: Dr. Vanetta Shawl, MD 8/28/20179:30am  Nursing: Antoine Primas, RN8/28/20179:30am  RN Care Manager: Onnie Boer, CM 03/02/16  Social Workers:  Samuella Bruin, LCSW 8/28/20179:30am  Nurse Pratictioners:    Scribe for Treatment Team: Samuella Bruin, LCSW Clinical Social Worker [facility] [phone]

## 2016-03-02 NOTE — [facility] Counselor (Signed)
Adult Comprehensive Assessment  Patient ID: Justin Fisher, male   DOB: January 19, 1986, 30 y.o.   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 Identifies father as primary Youth worker / Lack of resources (include bankruptcy): income from employment/has some financial support from his father/BCBS through "[employer] / Lack of housing: Lives alone in a house in [place] Physical health (include injuries & life threatening diseases): none identified Social relationships: poor-no friends in community that are supportive and in recovery.  Substance abuse: Daily heroin use (IV), crack cocaine use every other day, also taking prescription medications Bereavement / Loss: loss of family relationships due to poor choices and alcoholism  Living/Environment/Situation:  Living Arrangements: Alone Living conditions (as described by patient or guardian): Lives alone in a house in [place] How long has patient lived in current situation?: 4 months What is atmosphere in current home: Safe, comfortable  Family History:  Marital status: Single Are you sexually active?: No What is your sexual orientation?: heterosexual Has your sexual activity been affected by drugs, alcohol, medication, or emotional stress?: n/a  Does patient have children?: No  Childhood History:  By whom was/is the patient raised?: Both parents Additional childhood history information: Pt did not wish to discuss family in detail. No mental illness or substance abuse issues in family Description of patient's relationship with caregiver when they were a child: close to parents Patient's description of current relationship with people who raised him/her: strained from  parents-"burned bridges". Identifies father as supportive, more distant with mother How were you disciplined when you got in trouble as a child/adolescent?: pt did not answer Does patient have siblings?: Yes Number of Siblings: 2 Description of patient's current relationship with siblings: distant with brother and sister Did patient suffer any verbal/emotional/physical/sexual abuse as a child?: No Did patient suffer from severe childhood neglect?: No Has patient ever been sexually abused/assaulted/raped as an adolescent or adult?: No Was the patient ever a victim of a crime or a disaster?: No Witnessed domestic violence?: No Has patient been effected by domestic violence as an adult?: No  Education:  Highest grade of school patient has completed: high school  Currently a student?: No Name of school: n/a  Learning disability?: No  Employment/Work Situation:   Employment situation: Employed 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?: No Are There Guns or Other Weapons in Your Home?: No Are These Weapons Safely Secured?: No  Financial Resources:   Financial resources: Income from employment, Private insurance Does patient have a representative payee or guardian?: No  Alcohol/Substance Abuse:   What has been your use of drugs/alcohol within the last 12 months?: Daily heroin use (IV) , crack cocaine use every other day, also taking prescription medications If attempted suicide, did drugs/alcohol play a role in this?: Yes (past attempts. most recently, pt reports chronic SI with plan) Alcohol/Substance  Abuse Treatment Hx: Past Tx, Inpatient If yes, describe treatment: [facility] Dix 25 +  times[facility] last admission in March 2017 with 2 observation unit stays since that time. Pt reports [facility] and got out of treatment in january 2017 but is hoping to find a new psychiatrist.  Has alcohol/substance abuse ever caused legal problems?: No  Social Support System:   Forensic psychologistatient's Community Support System: Poor Describe Community Support System: father Type of faith/religion: n/a  How does patient's faith help to cope with current illness?: n/a   Leisure/Recreation:   Leisure and Hobbies: pt did not answer  Strengths/Needs:   What things does the patient do well?: pt did not answer In what areas does patient struggle / problems for patient: coping with depression/SI. irritability/emotional regulation; coping skills  Discharge Plan:   Does patient have access to transportation?: Yes (car and license) Will patient be returning to same living situation after discharge?: Yes- plans to return home Currently receiving community mental health services: No If no, would patient like referral for services when discharged?: Yes Suboxone & outpatient psychiatry Does patient have financial barriers related to discharge medications?: No (income from employment and insurance)  Summary/Recommendations:   Summary and Recommendations (to be completed by the evaluator): Patient is a [age] year old male who presented to the hospital with substance abuse and depression. Primary triggers for admission include limited social supports and lack of outpatient providers. Patient will benefit from crisis stabilization, medication evaluation, group therapy and psycho education in addition to case management for discharge planning. At discharge, it is recommended that Pt remain compliant with established discharge plan and continued treatment.  Samuella BruinKristin Beaulah Romanek, LCSW Clinical Social  Worker [facility] [phone]

## 2016-03-02 NOTE — Progress Notes (Signed)
[facility] MD Progress Note  03/02/2016 10:35 AM Justin McardleDavid Fisher  MRN:  [id number] Subjective:   He states that he feels much better. He continues to feel anxious and would like his gabapentin to back at home dose; 600 mg QID. He states that he came here as he had worsening withdrawal symptoms and SI of "would rather die or get clan" without plans. He wanted to "physically get off dope." He is motivated for sobriety, but is not interested in residential treatment, stating that he has tried them multiple times in the past; last in fellowship fall in last December. He has seen outpatient psychiatrist in [place] but it has been difficult for him to commute. He also states that he has stopped refilling medication (gabapentin), thinking that "heroin" is enough for him. He states that he has been using substance as below "to get high and happy," the believes he has physical dependence. He denies  SI/HI/AH/VH. He reports a couple of days of decreased need for sleep and felt euphoria, mostly related to his substance use.   Psych outpatient: couple of months ago in [place]  Substance use Shooting Heroin every day Cocaine: a couple times per week Alcohol: fifth of liquor/whiskey per day, last use 4 days ago Longest sobriety: 1.5 years in 2012-2013, it was helpful for him to move to a new city, live in halfway house, and have a girlfriend,   Suicide attempt: rat poison, OD pills, car in garage with engine turned on,  at age 30, 32 yo 32 yo benzo, alcohol   FHx denies suicide attempt FHx mother with cancer/anxiety, father-depression, sister on Ritalin   Principal Problem: Heroin use disorder, moderate, dependence (HCC) Diagnosis:   Patient Active Problem List   Diagnosis Date Noted  . Opiate dependence (HCC) [F11.20] 03/01/2016  . MDD (major depressive disorder) (HCC) [F32.9] 02/29/2016  . 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 MDD (major depressive disorder), recurrent severe, without psychosis (HCC) [F33.2] 09/11/2015  . Alcohol use disorder, moderate, dependence (HCC) [F10.20] 09/11/2015   Total Time spent with patient: 20 minutes  Past Psychiatric History: see HPI  Past Medical History:  Past Medical History:  Diagnosis Date  . Anxiety   . Suicidal thoughts    History reviewed. No pertinent surgical history. Family History: History reviewed. No pertinent family history. Family Psychiatric  History: FHx denies suicide attempt FHx mother with cancer/anxiety, father-depression, sister on Ritalin  Social History:  History  Alcohol Use  . Yes     History  Drug Use No    Social History   Social History  . Marital status: Single    Spouse name: N/A  . Number of children: N/A  . Years of education: N/A   Social History Main Topics  . Smoking status: Current Every Day Smoker    Packs/day: 0.50  . Smokeless tobacco: Never Used  . Alcohol use Yes  . Drug use: No  . Sexual activity: Not Asked   Other Topics Concern  . None   Social History Narrative  . None   Additional Social History:         Work- day labor Lives by himself No children, single                Sleep: Good  Appetite:  Good  Current Medications: Current Facility-Administered Medications  Medication Dose Route Frequency  Provider Last Rate Last Dose  . acetaminophen (TYLENOL) tablet 650 mg  650 mg Oral Q6H PRN Thermon Leyland, NP   650 mg at 03/01/16 1947  . alum & mag hydroxide-simeth (MAALOX/MYLANTA) 200-200-20 MG/5ML suspension 30 mL  30 mL Oral Q4H PRN Thermon Leyland, NP      . cloNIDine (CATAPRES) tablet 0.1 mg  0.1 mg Oral BH-qamhs Thermon Leyland, NP       Followed by  . [START ON 03/04/2016] cloNIDine (CATAPRES) tablet 0.1 mg  0.1 mg Oral QAC breakfast Thermon Leyland, NP   0.1 mg at 03/02/16 0810  . dicyclomine  (BENTYL) tablet 20 mg  20 mg Oral Q6H PRN Thermon Leyland, NP   20 mg at 03/02/16 0606  . gabapentin (NEURONTIN) capsule 300 mg  300 mg Oral TID Thermon Leyland, NP   300 mg at 03/02/16 0810  . hydrOXYzine (ATARAX/VISTARIL) tablet 50 mg  50 mg Oral Q6H PRN Thermon Leyland, NP   50 mg at 03/01/16 1615  . ibuprofen (ADVIL,MOTRIN) tablet 400 mg  400 mg Oral Q6H PRN Thermon Leyland, NP   400 mg at 03/01/16 1615  . lamoTRIgine (LAMICTAL) tablet 25 mg  25 mg Oral Daily Thermon Leyland, NP   25 mg at 03/02/16 0809  . loperamide (IMODIUM) capsule 2-4 mg  2-4 mg Oral PRN Thermon Leyland, NP   2 mg at 03/01/16 1615  . LORazepam (ATIVAN) tablet 1 mg  1 mg Oral Q8H PRN Thermon Leyland, NP   1 mg at 03/02/16 0606  . magnesium hydroxide (MILK OF MAGNESIA) suspension 30 mL  30 mL Oral Daily PRN Thermon Leyland, NP      . methocarbamol (ROBAXIN) tablet 500 mg  500 mg Oral Q8H PRN Thermon Leyland, NP   500 mg at 03/02/16 0606  . naproxen (NAPROSYN) tablet 500 mg  500 mg Oral BID PRN Thermon Leyland, NP   500 mg at 03/01/16 1105  . nicotine polacrilex (NICORETTE) gum 2 mg  2 mg Oral PRN Thermon Leyland, NP   2 mg at 03/02/16 0606  . ondansetron (ZOFRAN-ODT) disintegrating tablet 4 mg  4 mg Oral Q6H PRN Thermon Leyland, NP   4 mg at 02/29/16 1459  . sertraline (ZOLOFT) tablet 200 mg  200 mg Oral Daily Thermon Leyland, NP   200 mg at 03/02/16 0809  . traZODone (DESYREL) tablet 100 mg  100 mg Oral QHS PRN Thermon Leyland, NP   100 mg at 03/01/16 2116    Lab Results: No results found for this or any previous visit (from the past 48 hour(s)).  Blood Alcohol level:  Lab Results  Component Value Date   ETH <5 02/28/2016   ETH <5 01/01/2016    Metabolic Disorder Labs: Lab Results  Component Value Date   HGBA1C 6.5 (H) 09/12/2015   MPG 140 09/12/2015   No results found for: PROLACTIN Lab Results  Component Value Date   CHOL 215 (H) 09/12/2015   TRIG 275 (H) 09/12/2015   HDL 32 (L) 09/12/2015   CHOLHDL 6.7 09/12/2015   VLDL 55 (H)  09/12/2015   LDLCALC 128 (H) 09/12/2015    Physical Findings: AIMS:  , ,  ,  ,    CIWA:    COWS:  COWS Total Score: 7  Musculoskeletal: Strength & Muscle Tone: within normal limits Gait & Station: normal Patient leans: N/A  Psychiatric Specialty Exam: Physical Exam  Review of Systems  Constitutional: Negative for chills and diaphoresis.  Eyes: Positive for blurred vision.  Cardiovascular: Positive for chest pain and palpitations.  Gastrointestinal: Negative for diarrhea, nausea and vomiting.  Neurological: Positive for tremors and headaches. Negative for dizziness.  Psychiatric/Behavioral: The patient is nervous/anxious. The patient does not have insomnia.     Blood pressure 101/66, pulse 75, temperature 98 F (36.7 C), resp. rate 18, height 5\' 7"  (1.702 m), weight 196 lb (88.9 kg), SpO2 100 %.Body mass index is 30.7 kg/m.  General Appearance: Fairly Groomed  Eye Contact:  Good  Speech:  Clear and Coherent  Volume:  Normal  Mood:  Anxious  Affect:  anxious, restless  Thought Process:  Coherent and Goal Directed  Orientation:  Full (Time, Place, and Person)  Thought Content:  Logical Perceptions: denies AH/VH  Suicidal Thoughts:  No  Homicidal Thoughts:  No  Memory:  intact to recent and delayed recall  Judgement:  Fair  Insight:  Shallow  Psychomotor Activity:  Increased  Concentration:  Concentration: Fair and Attention Span: Fair  Recall:  Good  Fund of Knowledge:  Fair  Language:  Fair  Akathisia:  No  Handed:  Right  AIMS (if indicated):     Assets:  Communication Skills Desire for Improvement  ADL's:  Intact  Cognition:  WNL  Sleep:  Number of Hours: 6.5   Assessment Justin Fisher is an 30 y.o. male with poly substance use (heroin, alcohol, cocaine) who presented to [facility] requesting detox from heroin with passive SI. (Utox positive for cocaine, opiates, 02/28/2016 Et OH <5)  # MDD # r/o Substance induced mood disorder There has been an improvement in  his neurovegetative symptoms since he is abstinent from drug. Will continue sertraline. Will increase gabapentin to target his anxiety/craving for alcohol. Although lamotrigine may be discontinued given his lack of significant manic symptoms/irritability, he reports his preference to continue this medication.   # Heroin use disorder # Cocaine use disorder # Alcohol use disorder He is motivated for sobriety and is interested in outpatient follow up. He reports some physical symptoms of withdrawal but has not had any significant signs of alcohol withdrawal. Having discussed with patient, will have librium prn available (rather than standing dose). Will continue clonidine protocol.   Plans - Continue sertraline 200 mg daily - Increase gabapentin 400 mg TID - Continue lamotrigine 25 mg daily - Continue clonidine protocol - Librium 25 mg q6hprn for alcohol withdrawal - - Admit for crisis management and stabilization. - Medication management to reduce current symptoms to base line and improve the patient's overall level of functioning. - Monitor for the adverse effect of the medications and anger outbursts - Continue 15 minutes observation for safety concerns - Encouraged to participate in milieu therapy and group therapy counseling sessions and also work with coping skills -  Develop treatment plan to decrease risk of relapse upon discharge and to reduce the need for readmission. -  Psycho-social education regarding relapse prevention and self care. - Health care follow up as needed for medical problems. - Restart home medications where appropriate.  Treatment Plan Summary: Daily contact with patient to assess and evaluate symptoms and progress in treatment  Neysa Hotter, MD 03/02/2016, 10:35 AM

## 2016-03-03 DIAGNOSIS — F17210 Nicotine dependence, cigarettes, uncomplicated: Secondary | ICD-10-CM

## 2016-03-03 LAB — TSH: TSH: 0.901 u[IU]/mL (ref 0.350–4.500)

## 2016-03-03 LAB — HIV ANTIBODY (ROUTINE TESTING W REFLEX): HIV SCREEN 4TH GENERATION: NONREACTIVE

## 2016-03-03 MED ORDER — NICOTINE POLACRILEX 2 MG MT GUM: 2.0000 mg | CHEWING_GUM | OROMUCOSAL | 0 refills | Status: DC | PRN

## 2016-03-03 MED ORDER — IBUPROFEN 200 MG PO TABS: 400.0000 mg | ORAL_TABLET | Freq: Four times a day (QID) | ORAL | 0 refills | Status: DC | PRN

## 2016-03-03 MED ORDER — TRAZODONE HCL 100 MG PO TABS: 100.0000 mg | ORAL_TABLET | Freq: Every evening | ORAL | 0 refills | Status: DC | PRN

## 2016-03-03 MED ORDER — LAMOTRIGINE 25 MG PO TABS: 25.0000 mg | ORAL_TABLET | Freq: Every day | ORAL | 0 refills | Status: DC

## 2016-03-03 MED ORDER — SERTRALINE HCL 100 MG PO TABS: 200.0000 mg | ORAL_TABLET | Freq: Every day | ORAL | 0 refills | Status: DC

## 2016-03-03 MED ORDER — HYDROXYZINE HCL 50 MG PO TABS: 50.0000 mg | ORAL_TABLET | Freq: Four times a day (QID) | ORAL | 0 refills | Status: DC | PRN

## 2016-03-03 MED ORDER — GABAPENTIN 400 MG PO CAPS: 400.0000 mg | ORAL_CAPSULE | Freq: Three times a day (TID) | ORAL | 0 refills | Status: DC

## 2016-03-03 NOTE — [facility] Suicide Risk Assessment (Signed)
[facility]   Principal Problem: Heroin use disorder, moderate, dependence (HCC) Discharge Diagnoses:  Patient Active Problem List   Diagnosis Date Noted  . Opiate dependence (HCC) [F11.20] 03/01/2016  . MDD (major depressive disorder) (HCC) [F32.9] 02/29/2016  . 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 MDD (major depressive disorder), recurrent severe, without psychosis (HCC) [F33.2] 09/11/2015  . Alcohol use disorder, moderate, dependence (HCC) [F10.20] 09/11/2015    Total Time spent with patient: 20 minutes  Musculoskeletal: Strength & Muscle Tone: within normal limits Gait & Station: normal Patient leans: N/A  Psychiatric Specialty Exam: ROS  Blood pressure 113/68, pulse 97, temperature 98.1 F (36.7 C), temperature source Oral, resp. rate 16, height 5\' 7"  (1.702 m), weight 196 lb (88.9 kg), SpO2 100 %.Body mass index is 30.7 kg/m.  General Appearance: Casual  Eye Contact::  Good  Speech:  Clear and Coherent409  Volume:  Normal  Mood:  "great"  Affect:  Appropriate  Thought Process:  Coherent and Goal Directed Perceptions: denies AH/VH  Orientation:  Full (Time, Place, and Person)  Thought Content:  Logical  Suicidal Thoughts:  No  Homicidal Thoughts:  No  Memory:  intact to recent and remote recall  Judgement:  Fair  Insight:  Fair  Psychomotor Activity:  Normal  Concentration:  Good  Recall:  Good  Fund of Knowledge:Good  Language: Good  Akathisia:  No  Handed:  Right  AIMS (if indicated):     Assets:  Communication Skills Desire for Improvement  Sleep:  Number of Hours: 6  Cognition: WNL  ADL's:  Intact   Mental Status Per Nursing Assessment::   On Admission:     Demographic Factors:  Male and NA  Loss Factors: NA  Historical Factors: Prior  suicide attempts and Family history of mental illness or substance abuse Suicide attempt: rat poison, OD pills, car in garage with engine turned on,  at age 30, 30 yo 32 yo benzo, alcohol  FHx mother with cancer/anxiety, father-depression, sister on Ritalin  Risk Reduction Factors:   Employed and Positive social support  Continued Clinical Symptoms:  Depression:   Comorbid alcohol abuse/dependence Alcohol/Substance Abuse/Dependencies  Cognitive Features That Contribute To Risk:  None    Suicide Risk:  Mild:  Suicidal ideation of limited frequency, intensity, duration, and specificity.  There are no identifiable plans, no associated intent, mild dysphoria and related symptoms, good self-control (both objective and subjective assessment), few other risk factors, and identifiable protective factors, including available and accessible social support.  Follow-up Information    [facility] .   Specialty:  Behavioral Health Why:  Walk in clinic on Mondays, Wednesdays, and Fridays at 12:30pm for assessment for therapy and medication management services.  Contact information: [address] [place] [address] [phone]         Patient is motivated for sobriety. He is interested in intensive outpatient follow up and was provided above information. Patient will be discharged to his apartment. He denies SI.   Plan Of Care/Follow-up recommendations:  Activity:  full Diet:  regular Tests:  none Other:  n/a  Neysa Hottereina Jasdeep Kepner, MD 03/03/2016, 11:56 AM

## 2016-03-03 NOTE — Progress Notes (Signed)
D: Pt was in the hallway upon initial approach.  Pt has anxious affect and mood.  He states "I just wanna get out of here."  Pt reports he feels safe to discharge soon.  Pt denies SI/HI, denies hallucinations, denies pain.  Complains of withdrawal symptom of anxiety, sweating, restlessness, and feeling "shaky."  Pt has been visible in milieu interacting with peers and staff appropriately.  Pt attended evening group.   A: Introduced self to pt.  Met with pt and offered support and encouragement.  Actively listened to pt.  Medications administered per order.  PRN medication administered for withdrawal. R: Pt is compliant with medications.  Pt verbally contracts for safety.  Will continue to monitor and assess.

## 2016-03-03 NOTE — Progress Notes (Signed)
Patient ID: Justin Fisher, male   DOB: 12/03/1985, 30 y.o.   MRN: [id number] DIS - CHARGE  NOTE  ----  DC pt as ordered.  [facility] staff met with pt prior to DC to answer any questions about treatment.  All prescriptions were provided and explained.  All possessions were returned.  Pt denied pain , SI/HI/HA and agreed to contract for safety at time of DC.  Pt agreed  to remain compliant on medications.  He promised to stay safe after DC .  Suicide safety plan was completed and reviewed with pt on discharge --  A ---   Escort pt to front lobby at  [facility]. , 02/22/16   ---   R ---  Pt was safe at time of DC

## 2016-03-03 NOTE — Progress Notes (Signed)
  [facility]  Will you be returning to the same living situation after discharge:  Yes,  patient plans to return home At discharge, do you have transportation home?: Yes,  patient's car is at ED Do you have the ability to pay for your medications: Yes,  patient will be provided with prescriptions at discharge  Release of information consent forms completed and in the chart;  Patient's signature needed at discharge.  Patient to Follow up at: Follow-up Information    [facility] .   Specialty:  Behavioral Health Why:  Walk in clinic on Mondays, Wednesdays, and Fridays at 12:30pm for assessment for therapy and medication management services.  Contact information: [address] [place] [address] [phone]           Next level [facility] care provider [facility]:no  Safety Planning and Suicide Prevention discussed: Yes,  with patient  Have you used any form of tobacco in the last 30 days? (Cigarettes, Smokeless Tobacco, Cigars, and/or Pipes): Yes  Has patient been referred to the Quitline?: Patient refused referral  Patient has been referred for addiction treatment: Yes  Evona Westra L Oaklen Thiam 03/03/2016, 10:46 AM

## 2016-03-03 NOTE — Discharge Summary (Signed)
Physician Discharge Summary Note  Patient:  Justin Fisher is an 30 y.o., male MRN:  [id number] DOB:  1985-10-23 Patient phone:  [phone] (home)  Patient address:   [address] [place] [id number],  Total Time spent with patient: Greater than 30 minutes  Date of Admission:  02/28/2016  Date of Discharge: 03-03-16  Reason for Admission: Drug detox.  Principal Problem: Heroin use disorder, moderate, dependence (HCC) Discharge Diagnoses: Patient Active Problem List   Diagnosis Date Noted  . Opiate dependence (HCC) [F11.20] 03/01/2016  . MDD (major depressive disorder) (HCC) [F32.9] 02/29/2016  . 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 MDD (major depressive disorder), recurrent severe, without psychosis (HCC) [F33.2] 09/11/2015  . Alcohol use disorder, moderate, dependence (HCC) [F10.20] 09/11/2015   Past Psychiatric History: Alcohol use disorder.  Past Medical History:  Past Medical History:  Diagnosis Date  . Anxiety   . Suicidal thoughts    History reviewed. No pertinent surgical history. Family History: History reviewed. No pertinent family history.  Family Psychiatric  History: See H&P  Social History:  History  Alcohol Use  . Yes     History  Drug Use No    Social History   Social History  . Marital status: Single    Spouse name: N/A  . Number of children: N/A  . Years of education: N/A   Social History Main Topics  . Smoking status: Current Every Day Smoker    Packs/day: 0.50  . Smokeless tobacco: Never Used  . Alcohol use Yes  . Drug use: No  . Sexual activity: Not Asked   Other Topics Concern  . None   Social History Narrative  . None   Hospital Course:  Justin Fisher is an 30 y.o. male who [facility]. Patient  stared using Heroin during his early 20's. 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 Patient has used Heroin consistently over the past  month. He reports using .5 gram daily and last used yesterday. His UDS is positive for opiates and cocaine.  He does admit consuming alcohol every other day. He drinks 6 to 8 beers per use.  Patient's BAL is negative. Several withdrawal symptoms are reported today: nausea, diarrhea, increased anxiety, flu like symptoms, chills, hot flashes and sweats. No history of seizures. Patient does have a history of black outs. Patient has [facility] Obs [facility]. He was most recently in the Observation Unit on 01/01/2016. He [facility] also [facility].   Justin Fisher [facility] with his UDS test results positive for opioid & Cocaine. He did admit to having been using heroin for the past month & drinking alcohol heavily. He was also presenting with symptoms of depression, possibly substance induced & hash substance withdrawal symptoms. He was in need of opioid detox as well as mood stabilization treatments. After admission assessment/evaluation, his presenting symptoms were  identified. The medication regimen targeting those symptoms were discussed & initiated. He received Clonidine detoxification treatment protocols to combat the withdrawal symptoms of opioid. He was also medicated & discharged on; Sertraline 100 mg for depression, Nicorette gum for smoking cessation & Trazodone 50 mg for insomnia. He was enrolled & participated in the group counseling sessions  being offered & held on this unit. He learned coping skills that should help him further to cope better & manage his depression/substance abuse issues after discharge.  Justin Fisher has completed detox treatment & his mood is stable. This is evidenced by his reports of improved mood, absence of  suicidal ideations & or substance withdrawal symptoms. He is currently being discharged to continue further substance abuse treatment at the [facility] here in [place], [place]. He is provided with all the pertinent information needed to make this appointment without problems. Upon discharge, Justin Fisher adamantly denies any SIHI, AVH, delusional thoughts, paranoia or substance withdrawal symptoms. He left [facility] with all personal belongings in no apparent distress. Transportation per himself.  Physical Findings: AIMS: Facial and Oral Movements Muscles of Facial Expression: None, normal Lips and Perioral Area: None, normal Jaw: None, normal Tongue: None, normal,Extremity Movements Upper (arms, wrists, hands, fingers): None, normal Lower (legs, knees, ankles, toes): None, normal, Trunk Movements Neck, shoulders, hips: None, normal, Overall Severity Severity of abnormal movements (highest score from questions above): None, normal Incapacitation due to abnormal movements: None, normal Patient's awareness of abnormal movements (rate only patient's report): No Awareness,    CIWA:    COWS:  COWS Total Score: 0  Musculoskeletal: Strength & Muscle Tone: within normal limits Gait & Station: normal Patient leans: N/A  Psychiatric Specialty Exam: Physical Exam  Constitutional: He appears well-developed and well-nourished.  HENT:  Head: Normocephalic.  Eyes: Pupils are equal, round, and reactive to light.  Neck: Normal range of motion.  Cardiovascular: Normal rate.   Respiratory: Effort normal.  GI: Soft.  Genitourinary:  Genitourinary Comments: Denies any issues in this area  Musculoskeletal: Normal range of motion.  Neurological: He is alert.  Skin: Skin is warm.    Review of Systems  Constitutional: Negative.   HENT: Negative.   Eyes: Negative.   Respiratory: Negative.   Cardiovascular: Negative.   Gastrointestinal: Negative.   Genitourinary: Negative.   Musculoskeletal: Negative.   Skin:  Negative.   Neurological: Negative.   Endo/Heme/Allergies: Negative.   Psychiatric/Behavioral: Positive for depression (Stable) and substance abuse (Opioid use disorder). Negative for hallucinations, memory loss and suicidal ideas. The patient has insomnia. The patient is not nervous/anxious.     Blood pressure 113/68, pulse 97, temperature 98.1 F (36.7 C), temperature source Oral, resp. rate 16, height 5\' 7"  (1.702 m), weight 88.9 kg (196 lb), SpO2 100 %.Body mass index is 30.7 kg/m.  See Md's SRA   Have you used any form of tobacco in the last 30 days? (Cigarettes, Smokeless Tobacco, Cigars, and/or Pipes): Yes  Has this patient used any form of tobacco in the last 30 days? (Cigarettes, Smokeless Tobacco, Cigars, and/or Pipes): Yes, provided with nicorette gum prescription.  Blood Alcohol level:  Lab Results  Component Value Date   ETH <5 02/28/2016   ETH <5 01/01/2016    Metabolic Disorder Labs:  Lab Results  Component Value Date   HGBA1C 6.5 (H) 09/12/2015   MPG 140 09/12/2015   No results found for: PROLACTIN Lab Results  Component Value Date   CHOL 215 (H) 09/12/2015   TRIG 275 (H) 09/12/2015   HDL 32 (L) 09/12/2015   CHOLHDL 6.7 09/12/2015   VLDL 55 (H) 09/12/2015   LDLCALC 128 (H) 09/12/2015   See Psychiatric Specialty Exam and Suicide Risk Assessment completed by Attending Physician prior to discharge.  Discharge destination:  Home  Is patient on multiple antipsychotic therapies at discharge:  No   Has Patient had  three or more failed trials of antipsychotic monotherapy by history:  No  Recommended Plan for Multiple Antipsychotic Therapies: NA    Medication List    STOP taking these medications   hydrOXYzine 50 MG capsule Commonly known as:  VISTARIL     TAKE these medications     Indication  gabapentin 400 MG capsule Commonly known as:  NEURONTIN Take 1 capsule (400 mg total) by mouth 3 (three) times daily. For agitation What changed:  additional  instructions  Another medication with the same name was removed. Continue taking this medication, and follow the directions you see here.  Indication:  Agitation   hydrOXYzine 50 MG tablet Commonly known as:  ATARAX/VISTARIL Take 1 tablet (50 mg total) by mouth every 6 (six) hours as needed for anxiety.  Indication:  Anxiety   ibuprofen 200 MG tablet Commonly known as:  ADVIL,MOTRIN Take 2 tablets (400 mg total) by mouth every 6 (six) hours as needed for headache.  Indication:  Migraine Headache   lamoTRIgine 25 MG tablet Commonly known as:  LAMICTAL Take 1 tablet (25 mg total) by mouth daily. For mood stabilization What changed:  medication strength  how much to take  Indication:  Depression   nicotine polacrilex 2 MG gum Commonly known as:  NICORETTE Take 1 each (2 mg total) by mouth as needed for smoking cessation.  Indication:  Nicotine Addiction   sertraline 100 MG tablet Commonly known as:  ZOLOFT Take 2 tablets (200 mg total) by mouth daily. For depression  Indication:  Major Depressive Disorder   traZODone 100 MG tablet Commonly known as:  DESYREL Take 1 tablet (100 mg total) by mouth at bedtime as needed for sleep (may repeat x 1). For sleep What changed:  when to take this  reasons to take this  additional instructions  Indication:  Trouble Sleeping      Follow-up Information    [facility] .   Specialty:  Behavioral Health Why:  Walk in clinic on Mondays, Wednesdays, and Fridays at 12:30pm for assessment for therapy and medication management services.  Contact information: [address] [place] [address] [phone]          Follow-up recommendations: Activity:  As tolerated Diet: As recommended by your primary care doctor. Keep all scheduled follow-up appointments as recommended.   Comments: Patient is instructed prior to discharge to: Take all medications as prescribed by his/her mental healthcare  provider. Report any adverse effects and or reactions from the medicines to his/her outpatient provider promptly. Patient has been instructed & cautioned: To not engage in alcohol and or illegal drug use while on prescription medicines. In the event of worsening symptoms, patient is instructed to call the crisis hotline, 911 and or go to the nearest ED for appropriate evaluation and treatment of symptoms. To follow-up with his/her primary care provider for your other medical issues, concerns and or health care needs.   Signed: Sanjuana Kava, NP, PMHNP, FNP-BC 03/03/2016, 4:29 PM

## 2016-03-03 NOTE — [facility] Suicide Risk Assessment (Signed)
[facility] INPATIENT:  Family/Significant Other Suicide Prevention Education  Suicide Prevention Education:  Patient Refusal for Family/Significant Other Suicide Prevention Education: The patient Justin Fisher has refused to provide written consent for family/significant other to be provided Family/Significant Other Suicide Prevention Education during admission and/or prior to discharge.  Physician notified. SPE reviewed with patient and brochure provided. Patient encouraged to return to hospital if having suicidal thoughts, patient verbalized his/her understanding and has no further questions at this time.   [name] L [name] 03/03/2016, 10:38 AM

## 2016-03-03 NOTE — [facility] Group Notes (Signed)
Pt attended spiritual care group on grief and loss facilitated by chaplain Burnis KingfisherMatthew Sheina Mcleish   Group opened with brief discussion and psycho-social ed around grief and loss in relationships and in relation to self - identifying life patterns, circumstances, changes that cause losses. Established group norm of speaking from own life experience. Group goal of establishing open and affirming space for members to share loss and experience with grief, normalize grief experience and provide psycho social education and grief support.    Group drew on Narrative, Adlerian and brief CBT

## 2016-03-04 LAB — HEPATITIS B CORE ANTIBODY, IGM: Hep B C IgM: NEGATIVE

## 2016-03-04 LAB — HEPATITIS B SURFACE ANTIGEN: Hepatitis B Surface Ag: NEGATIVE

## 2016-03-04 LAB — HEPATITIS C ANTIBODY: HCV Ab: <0.1 s/co ratio (ref 0.0–0.9)

## 2016-03-04 LAB — HEPATITIS A ANTIBODY, IGM: Hep A IgM: NEGATIVE

## 2016-06-13 ENCOUNTER — Encounter (HOSPITAL_COMMUNITY): Payer: Self-pay | Admitting: Oncology

## 2016-06-13 ENCOUNTER — Emergency Department (HOSPITAL_COMMUNITY): Payer: BLUE CROSS/BLUE SHIELD

## 2016-06-13 ENCOUNTER — Emergency Department (HOSPITAL_COMMUNITY)
Admission: EM | Admit: 2016-06-13 | Discharge: 2016-06-15 | Disposition: A | Discharge status: Other Acute Inpatient Hospital | Payer: BLUE CROSS/BLUE SHIELD | Attending: Emergency Medicine | Admitting: Emergency Medicine

## 2016-06-13 DIAGNOSIS — F32A Depression, unspecified: Secondary | ICD-10-CM

## 2016-06-13 DIAGNOSIS — F1120 Opioid dependence, uncomplicated: Secondary | ICD-10-CM

## 2016-06-13 DIAGNOSIS — F332 Major depressive disorder, recurrent severe without psychotic features: Secondary | ICD-10-CM | POA: Diagnosis not present

## 2016-06-13 DIAGNOSIS — R509 Fever, unspecified: Secondary | ICD-10-CM | POA: Insufficient documentation

## 2016-06-13 DIAGNOSIS — Y92009 Unspecified place in unspecified non-institutional (private) residence as the place of occurrence of the external cause: Secondary | ICD-10-CM | POA: Diagnosis not present

## 2016-06-13 DIAGNOSIS — Y9339 Activity, other involving climbing, rappelling and jumping off: Secondary | ICD-10-CM | POA: Insufficient documentation

## 2016-06-13 DIAGNOSIS — Y999 Unspecified external cause status: Secondary | ICD-10-CM | POA: Insufficient documentation

## 2016-06-13 DIAGNOSIS — Z79899 Other long term (current) drug therapy: Secondary | ICD-10-CM | POA: Diagnosis not present

## 2016-06-13 DIAGNOSIS — X713XXA Intentional self-harm by drowning and submersion in natural water, initial encounter: Secondary | ICD-10-CM | POA: Insufficient documentation

## 2016-06-13 DIAGNOSIS — F17200 Nicotine dependence, unspecified, uncomplicated: Secondary | ICD-10-CM | POA: Insufficient documentation

## 2016-06-13 DIAGNOSIS — F329 Major depressive disorder, single episode, unspecified: Secondary | ICD-10-CM | POA: Insufficient documentation

## 2016-06-13 DIAGNOSIS — F17210 Nicotine dependence, cigarettes, uncomplicated: Secondary | ICD-10-CM | POA: Diagnosis not present

## 2016-06-13 DIAGNOSIS — T1491XA Suicide attempt, initial encounter: Secondary | ICD-10-CM | POA: Diagnosis not present

## 2016-06-13 DIAGNOSIS — R45851 Suicidal ideations: Secondary | ICD-10-CM

## 2016-06-13 DIAGNOSIS — F1310 Sedative, hypnotic or anxiolytic abuse, uncomplicated: Secondary | ICD-10-CM | POA: Diagnosis present

## 2016-06-13 LAB — COMPREHENSIVE METABOLIC PANEL
ALT: 102 U/L — ABNORMAL HIGH (ref 17–63)
ANION GAP: 10 (ref 5–15)
AST: 62 U/L — AB (ref 15–41)
Albumin: 5.6 g/dL — ABNORMAL HIGH (ref 3.5–5.0)
Alkaline Phosphatase: 92 U/L (ref 38–126)
BUN: 10 mg/dL (ref 6–20)
CHLORIDE: 100 mmol/L — AB (ref 101–111)
CO2: 29 mmol/L (ref 22–32)
Calcium: 9.6 mg/dL (ref 8.9–10.3)
Creatinine, Ser: 0.76 mg/dL (ref 0.61–1.24)
GFR calc Af Amer: >60 mL/min (ref >60)
Glucose, Bld: 86 mg/dL (ref 65–99)
POTASSIUM: 3.5 mmol/L (ref 3.5–5.1)
Sodium: 139 mmol/L (ref 135–145)
TOTAL PROTEIN: 9.6 g/dL — AB (ref 6.5–8.1)
Total Bilirubin: 0.7 mg/dL (ref 0.3–1.2)

## 2016-06-13 LAB — CBC
HCT: 45.1 % (ref 39.0–52.0)
Hemoglobin: 15.7 g/dL (ref 13.0–17.0)
MCH: 28.2 pg (ref 26.0–34.0)
MCHC: 34.8 g/dL (ref 30.0–36.0)
MCV: 81 fL (ref 78.0–100.0)
PLATELETS: 392 10*3/uL (ref 150–400)
RBC: 5.57 MIL/uL (ref 4.22–5.81)
RDW: 15.2 % (ref 11.5–15.5)
WBC: 21.7 10*3/uL — AB (ref 4.0–10.5)

## 2016-06-13 LAB — URINALYSIS, ROUTINE W REFLEX MICROSCOPIC
Bilirubin Urine: NEGATIVE
GLUCOSE, UA: NEGATIVE mg/dL
HGB URINE DIPSTICK: NEGATIVE
Ketones, ur: NEGATIVE mg/dL
Leukocytes, UA: NEGATIVE
Nitrite: NEGATIVE
PH: 5 (ref 5.0–8.0)
PROTEIN: NEGATIVE mg/dL
SPECIFIC GRAVITY, URINE: 1.01 (ref 1.005–1.030)

## 2016-06-13 LAB — RAPID URINE DRUG SCREEN, HOSP PERFORMED
Amphetamines: NOT DETECTED
BARBITURATES: NOT DETECTED
BENZODIAZEPINES: POSITIVE — AB
COCAINE: NOT DETECTED
OPIATES: NOT DETECTED
Tetrahydrocannabinol: NOT DETECTED

## 2016-06-13 LAB — SALICYLATE LEVEL

## 2016-06-13 LAB — ETHANOL

## 2016-06-13 LAB — ACETAMINOPHEN LEVEL

## 2016-06-13 MED ORDER — NICOTINE 21 MG/24HR TD PT24
21.0000 mg | MEDICATED_PATCH | Freq: Once | TRANSDERMAL | Status: AC
  Administered 2016-06-13: 21 mg via TRANSDERMAL
  Filled 2016-06-13: qty 1

## 2016-06-13 MED ORDER — SODIUM CHLORIDE 0.9 % IV BOLUS (SEPSIS)
1000.0000 mL | Freq: Once | INTRAVENOUS | Status: AC
  Administered 2016-06-14: 1000 mL via INTRAVENOUS

## 2016-06-13 MED ORDER — IBUPROFEN 800 MG PO TABS
800.0000 mg | ORAL_TABLET | Freq: Once | ORAL | Status: AC
  Administered 2016-06-13: 800 mg via ORAL
  Filled 2016-06-13: qty 1

## 2016-06-13 MED ORDER — CHLORDIAZEPOXIDE HCL 25 MG PO CAPS
100.0000 mg | ORAL_CAPSULE | Freq: Once | ORAL | Status: AC
  Administered 2016-06-13: 100 mg via ORAL
  Filled 2016-06-13: qty 4

## 2016-06-13 MED ORDER — LORAZEPAM 2 MG/ML IJ SOLN
1.0000 mg | Freq: Once | INTRAMUSCULAR | Status: AC
  Administered 2016-06-13: 1 mg via INTRAMUSCULAR
  Filled 2016-06-13: qty 1

## 2016-06-13 NOTE — ED Provider Notes (Signed)
[facility]-EMERGENCY DEPT Provider Note   CSN: [id number] Arrival date & time: 06/13/16  2021     History   Chief Complaint Chief Complaint  Patient presents with  . Suicidal  . Fever    HPI Justin Fisher is a 30 y.o. male.  HPI Patient with past medical history of heroin abuse, alcohol abuse, and depression presents with suicidal ideations and suicide attempt today. Patient reports increased thoughts over the last couple of weeks. He reports drinking alcohol, using hair when, and taking Xanax daily. He states over the last couple of days he is just "felt like I wanted die ". He states today he went to the roof of his one-story house and jumped off. No homicidal ideations. He states he is supposed to be taking Neurontin, Vistaril, Lamictal, Zoloft and trazodone. However, he has been intermittently taking this, and states he like to get back on his medications. No history of DTs. He denies any other complaints. He states he did 0.5 mg of heroin today and drank 4 "tall boys" of beer.   Past Medical History:  Diagnosis Date  . Anxiety   . Suicidal thoughts     Patient Active Problem List   Diagnosis Date Noted  . Opiate dependence (HCC) 03/01/2016  . MDD (major depressive disorder) 02/29/2016  . 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 MDD (major depressive disorder), recurrent severe, without psychosis (HCC) 09/11/2015  . Alcohol use disorder, moderate, dependence (HCC) 09/11/2015    History reviewed. No pertinent surgical history.     Home Medications    Prior to Admission medications   Medication Sig Start Date End Date Taking? Authorizing Provider  gabapentin (NEURONTIN) 400 MG capsule Take 1 capsule (400 mg total) by mouth 3 (three) times daily. For agitation 03/03/16  Yes Sanjuana Kava, NP  hydrOXYzine (ATARAX/VISTARIL) 50 MG  tablet Take 1 tablet (50 mg total) by mouth every 6 (six) hours as needed for anxiety. 03/03/16  Yes Sanjuana Kava, NP  ibuprofen (ADVIL,MOTRIN) 200 MG tablet Take 2 tablets (400 mg total) by mouth every 6 (six) hours as needed for headache. 03/03/16  Yes Sanjuana Kava, NP  lamoTRIgine (LAMICTAL) 25 MG tablet Take 1 tablet (25 mg total) by mouth daily. For mood stabilization 03/03/16  Yes Sanjuana Kava, NP  sertraline (ZOLOFT) 100 MG tablet Take 2 tablets (200 mg total) by mouth daily. For depression 03/03/16  Yes Sanjuana Kava, NP  traZODone (DESYREL) 100 MG tablet Take 1 tablet (100 mg total) by mouth at bedtime as needed for sleep (may repeat x 1). For sleep 03/03/16  Yes Sanjuana Kava, NP  nicotine polacrilex (NICORETTE) 2 MG gum Take 1 each (2 mg total) by mouth as needed for smoking cessation. Patient not taking: Reported on 06/13/2016 03/03/16   Sanjuana Kava, NP    Family History No family history on file.  Social History Social History  Substance Use Topics  . Smoking status: Current Every Day Smoker    Packs/day: 0.50  . Smokeless tobacco: Never Used  . Alcohol use Yes     Allergies   Patient has no known allergies.   Review of Systems Review of Systems All other systems negative unless otherwise stated in HPI   Physical Exam Updated Vital Signs BP 110/61   Pulse 94   Temp (S) 99.6 F (37.6 C) (Oral)  Resp 18   Ht 5\' 7"  (1.702 m)   Wt 81.6 kg   SpO2 95%   BMI 28.19 kg/m   Physical Exam  Constitutional: He is oriented to person, place, and time. He appears well-developed and well-nourished.  HENT:  Head: Normocephalic and atraumatic.  Right Ear: External ear normal.  Left Ear: External ear normal.  Eyes: Conjunctivae are normal. No scleral icterus.  Neck: No tracheal deviation present.  Cardiovascular: Regular rhythm.  Tachycardia present.  Exam reveals no gallop and no friction rub.   No murmur heard. Pulses:      Dorsalis pedis pulses are 2+ on the right  side, and 2+ on the left side.  Pulmonary/Chest: Effort normal and breath sounds normal. No respiratory distress.  Abdominal: He exhibits no distension.  Musculoskeletal: Normal range of motion. He exhibits tenderness.  Left foot: No bruising, swelling, or erythema.  Diffuse tenderness over MTPs.  Able to move all toes without difficulty.  Compartment soft and compressible.  No c/t/l midline tenderness.   Neurological: He is alert and oriented to person, place, and time.  Strength and sensation intact.   Skin: Skin is warm and dry.  Psychiatric: His behavior is normal. His mood appears anxious. His affect is labile. He expresses suicidal ideation. He expresses no homicidal ideation. He expresses suicidal plans. He expresses no homicidal plans.     ED Treatments / Results  Labs (all labs ordered are listed, but only abnormal results are displayed) Labs Reviewed  COMPREHENSIVE METABOLIC PANEL - Abnormal; Notable for the following:       Result Value   Chloride 100 (*)    Total Protein 9.6 (*)    Albumin 5.6 (*)    AST 62 (*)    ALT 102 (*)    All other components within normal limits  ACETAMINOPHEN LEVEL - Abnormal; Notable for the following:    Acetaminophen (Tylenol), Serum <10 (*)    All other components within normal limits  CBC - Abnormal; Notable for the following:    WBC 21.7 (*)    All other components within normal limits  RAPID URINE DRUG SCREEN, HOSP PERFORMED - Abnormal; Notable for the following:    Benzodiazepines POSITIVE (*)    All other components within normal limits  I-STAT CG4 LACTIC ACID, ED - Abnormal; Notable for the following:    Lactic Acid, Venous 2.38 (*)    All other components within normal limits  URINE CULTURE  CULTURE, BLOOD (ROUTINE X 2)  CULTURE, BLOOD (ROUTINE X 2)  ETHANOL  SALICYLATE LEVEL  URINALYSIS, ROUTINE W REFLEX MICROSCOPIC  I-STAT CG4 LACTIC ACID, ED  I-STAT CG4 LACTIC ACID, ED    EKG  EKG Interpretation None        Radiology Dg Chest 2 View  Result Date: 06/13/2016 CLINICAL DATA:  Fever EXAM: CHEST  2 VIEW COMPARISON:  10/30/2015 chest CT FINDINGS: Slightly low lung volumes. Normal heart size . Normal mediastinal contour. No pneumothorax. No pleural effusion. Lungs appear clear, with no acute consolidative airspace disease and no pulmonary edema. IMPRESSION: No active cardiopulmonary disease. Electronically Signed   By: Delbert PhenixJason A Poff M.D.   On: 06/13/2016 22:58   Dg Foot Complete Left  Result Date: 06/13/2016 CLINICAL DATA:  Left foot pain after jump from roof. EXAM: LEFT FOOT - COMPLETE 3+ VIEW COMPARISON:  None. FINDINGS: There is no evidence of fracture or dislocation. There is no evidence of arthropathy or other focal bone abnormality. Soft tissues are unremarkable. IMPRESSION: No  fracture or malalignment. Electronically Signed   By: Delbert PhenixJason A Poff M.D.   On: 06/13/2016 22:02    Procedures Procedures (including critical care time)  Medications Ordered in ED Medications  nicotine (NICODERM CQ - dosed in mg/24 hours) patch 21 mg (21 mg Transdermal Patch Applied 06/13/16 2222)  gabapentin (NEURONTIN) capsule 400 mg (not administered)  hydrOXYzine (ATARAX/VISTARIL) tablet 50 mg (not administered)  lamoTRIgine (LAMICTAL) tablet 25 mg (not administered)  sertraline (ZOLOFT) tablet 200 mg (not administered)  traZODone (DESYREL) tablet 100 mg (100 mg Oral Given 06/14/16 0022)  ibuprofen (ADVIL,MOTRIN) tablet 800 mg (800 mg Oral Given 06/13/16 2307)  LORazepam (ATIVAN) injection 1 mg (1 mg Intramuscular Given 06/13/16 2307)  chlordiazePOXIDE (LIBRIUM) capsule 100 mg (100 mg Oral Given 06/13/16 2307)  sodium chloride 0.9 % bolus 1,000 mL (0 mLs Intravenous Stopped 06/14/16 0202)  acetaminophen (TYLENOL) tablet 650 mg (650 mg Oral Given 06/14/16 0021)  sodium chloride 0.9 % bolus 1,000 mL (0 mLs Intravenous Stopped 06/14/16 0202)     Initial Impression / Assessment and Plan / ED Course  I have reviewed  the triage vital signs and the nursing notes.  Pertinent labs & imaging results that were available during my care of the patient were reviewed by me and considered in my medical decision making (see chart for details).  Clinical Course    Patient presents with suicidal ideations and suicide attempt today by jumping off a roof now with left foot pain.  Plain films unremarkable.  Labs with leukocytosis of 23 and mildly elevated transaminases.  Leukocytosis could be related to trauma, no other symptoms; therefore, code sepsis not activated.  Patient with temp 100.3 and tachycardia, HR 100-110.  He denies an cough, CP, SOB, or abdominal pain.  Encourage PO fluids and will give ibuprofen.  Will add on blood cultures, UA, and CXR. Code sepsis not activated as I suspect this is likely related to withdrawal and drug use. No new murmur on exam, low suspicion endocarditis. No neck pain, HA, AMS, or back pain to suggest meningitis.  Patient continues with temp 100.4 and mild tachycardia.  I have had to repeatedly de-escalate the patient and I suspect symptoms related to anxiety and withdrawal.  Home meds reordered.  Lactic 2.38.  UA without signs of infection.  CXR clear.  Blood cultures pending. Will give 1L NS.  Could be viral.  Temp and tachycardia resolved.  Repeat lactic 1.39.  I suspect this was related to recent drug use and possible viral illness.  Do not suspect endocarditis or meningitis.  Patient is medically cleared and stable for TTS consult.  [facility]  Case has been discussed with Dr. Adela LankFloyd who agrees with the above plan.    Final Clinical Impressions(s) / ED Diagnoses   Final diagnoses:  Suicidal ideation  Suicide attempt  Fever, unspecified fever cause    New Prescriptions New Prescriptions   No medications on file     Cheri FowlerKayla Arneda [place], PA-C 06/14/16 0258    Cheri FowlerKayla Marl [place], PA-C 06/14/16 0344    Melene Planan Floyd, DO 06/14/16 1816

## 2016-06-13 NOTE — ED Notes (Signed)
Pt is refusing to have blood drawn. Rn Freida BusmanAllen made aware.

## 2016-06-13 NOTE — ED Triage Notes (Signed)
Pt states that he is feeling suicidal.  States he has done 0.5 gram of heroin today.  Reports he has consumed 4 "tall boys" of beer.  Pt states, "I've had enough."  Pt stated he jumped off a roof today however denies injury.

## 2016-06-13 NOTE — ED Notes (Signed)
Patient's belongings placed in locker 27. Pt's blue suitcase can not fit in locker, blue suitcase placed at nurse's station.

## 2016-06-13 NOTE — BH Assessment (Addendum)
Per Jari Favrescar, RN the pt has been given Ativan and will be transferred in order to be monitored. UTA pt due to pt currently sleeping after being given Ativan.  Princess BruinsAquicha Duff, MSW, Theresia MajorsLCSWA

## 2016-06-14 DIAGNOSIS — F1310 Sedative, hypnotic or anxiolytic abuse, uncomplicated: Secondary | ICD-10-CM | POA: Diagnosis present

## 2016-06-14 LAB — I-STAT CG4 LACTIC ACID, ED
LACTIC ACID, VENOUS: 1.39 mmol/L (ref 0.5–1.9)
Lactic Acid, Venous: 2.38 mmol/L (ref 0.5–1.9)

## 2016-06-14 MED ORDER — ACETAMINOPHEN 325 MG PO TABS
650.0000 mg | ORAL_TABLET | Freq: Once | ORAL | Status: AC
  Administered 2016-06-14: 650 mg via ORAL
  Filled 2016-06-14: qty 2

## 2016-06-14 MED ORDER — GABAPENTIN 400 MG PO CAPS
400.0000 mg | ORAL_CAPSULE | Freq: Three times a day (TID) | ORAL | Status: DC
  Administered 2016-06-14 – 2016-06-15 (×4): 400 mg via ORAL
  Filled 2016-06-14 (×4): qty 1

## 2016-06-14 MED ORDER — LAMOTRIGINE 25 MG PO TABS
25.0000 mg | ORAL_TABLET | Freq: Every day | ORAL | Status: DC
  Administered 2016-06-14 – 2016-06-15 (×2): 25 mg via ORAL
  Filled 2016-06-14 (×2): qty 1

## 2016-06-14 MED ORDER — TRAZODONE HCL 100 MG PO TABS
100.0000 mg | ORAL_TABLET | Freq: Every evening | ORAL | Status: DC | PRN
  Administered 2016-06-14 (×2): 100 mg via ORAL
  Filled 2016-06-14 (×2): qty 1

## 2016-06-14 MED ORDER — SERTRALINE HCL 50 MG PO TABS
200.0000 mg | ORAL_TABLET | Freq: Every day | ORAL | Status: DC
  Administered 2016-06-14 – 2016-06-15 (×2): 200 mg via ORAL
  Filled 2016-06-14 (×2): qty 4

## 2016-06-14 MED ORDER — LORAZEPAM 1 MG PO TABS
1.0000 mg | ORAL_TABLET | Freq: Four times a day (QID) | ORAL | Status: DC | PRN
  Administered 2016-06-14: 1 mg via ORAL
  Filled 2016-06-14: qty 1

## 2016-06-14 MED ORDER — HYDROXYZINE HCL 25 MG PO TABS: 50.0000 mg | ORAL_TABLET | Freq: Four times a day (QID) | ORAL | Status: DC | PRN

## 2016-06-14 MED ORDER — SODIUM CHLORIDE 0.9 % IV BOLUS (SEPSIS)
1000.0000 mL | Freq: Once | INTRAVENOUS | Status: AC
  Administered 2016-06-14: 1000 mL via INTRAVENOUS

## 2016-06-14 NOTE — ED Notes (Signed)
Hourly rounding reveals patient sleeping in room. No complaints, stable, in no acute distress. Q15 minute rounds and monitoring via Security Cameras to continue. 

## 2016-06-14 NOTE — ED Notes (Addendum)
Pt. Transferred to [facility] from ED to room 35. Report to include Situation, Background, Assessment and [facility]ations [facility]. Patient is alert, warm and dry in no acute distress. Patient angry and verbally abusive, complaining of light that stays on in room. Pt. Pounds on nurses station window and tries to tear the signature pad off the ledge pulling the computer off the stand in nurses station. Redirection ineffective. Pt. Then stomps off to room 35 and attempts to barricade himself into his room not knowing that doors can be opened outward as needed.
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
## 2016-06-14 NOTE — ED Notes (Signed)
When attempting a treatment team assessment in pt's room with MD, NP, TS and this nurse , pt became angry and aggressive yelling, "I'm coming down off drugs--leave me the fuck alone!" Pt would not answer questions and suddenly jumped from his bed leaving the room.

## 2016-06-14 NOTE — ED Notes (Signed)
Report to include situation, background, assessment and recommendations from Theresa RN. Patient sleeping, respirations regular and unlabored. Q15 minute rounds and security camera observation to continue.   

## 2016-06-14 NOTE — ED Notes (Signed)
Pt was advised of needing to get IV fluids--- pt became verbally abusive and aggressive; pt yelled, "I don't want any IVs in me... Just give me water and I'll drink all you want!".  Pt will not listen to rationale of having IV fluids versus oral fluids.

## 2016-06-14 NOTE — ED Notes (Signed)
Hourly rounding reveals patient in room. No complaints, stable, in no acute distress. Q15 minute rounds and monitoring via Security Cameras to continue.Snack and beverage given. 

## 2016-06-15 ENCOUNTER — Encounter (HOSPITAL_COMMUNITY): Payer: Self-pay

## 2016-06-15 ENCOUNTER — Observation Stay (HOSPITAL_COMMUNITY)
Admission: AD | Admit: 2016-06-15 | Discharge: 2016-06-17 | Disposition: A | Discharge status: Home / Self Care | Payer: BLUE CROSS/BLUE SHIELD | Source: Intra-hospital | Attending: Psychiatry | Admitting: Psychiatry

## 2016-06-15 DIAGNOSIS — F332 Major depressive disorder, recurrent severe without psychotic features: Secondary | ICD-10-CM

## 2016-06-15 DIAGNOSIS — F1024 Alcohol dependence with alcohol-induced mood disorder: Principal | ICD-10-CM | POA: Insufficient documentation

## 2016-06-15 DIAGNOSIS — R45851 Suicidal ideations: Secondary | ICD-10-CM | POA: Insufficient documentation

## 2016-06-15 DIAGNOSIS — F329 Major depressive disorder, single episode, unspecified: Secondary | ICD-10-CM | POA: Diagnosis present

## 2016-06-15 DIAGNOSIS — F1310 Sedative, hypnotic or anxiolytic abuse, uncomplicated: Secondary | ICD-10-CM | POA: Diagnosis not present

## 2016-06-15 DIAGNOSIS — Z79899 Other long term (current) drug therapy: Secondary | ICD-10-CM

## 2016-06-15 DIAGNOSIS — F17210 Nicotine dependence, cigarettes, uncomplicated: Secondary | ICD-10-CM

## 2016-06-15 DIAGNOSIS — F1120 Opioid dependence, uncomplicated: Secondary | ICD-10-CM | POA: Diagnosis present

## 2016-06-15 DIAGNOSIS — Z9114 Patient's other noncompliance with medication regimen: Secondary | ICD-10-CM | POA: Diagnosis not present

## 2016-06-15 DIAGNOSIS — F32A Depression, unspecified: Secondary | ICD-10-CM | POA: Diagnosis present

## 2016-06-15 DIAGNOSIS — F411 Generalized anxiety disorder: Secondary | ICD-10-CM | POA: Diagnosis not present

## 2016-06-15 LAB — URINE CULTURE: CULTURE: NO GROWTH

## 2016-06-15 MED ORDER — HYDROXYZINE HCL 50 MG PO TABS
50.0000 mg | ORAL_TABLET | Freq: Four times a day (QID) | ORAL | Status: DC | PRN
  Administered 2016-06-16 – 2016-06-17 (×3): 50 mg via ORAL
  Filled 2016-06-15 (×3): qty 1

## 2016-06-15 MED ORDER — CLONIDINE HCL 0.1 MG PO TABS
0.1000 mg | ORAL_TABLET | Freq: Four times a day (QID) | ORAL | Status: DC
  Administered 2016-06-15 – 2016-06-17 (×7): 0.1 mg via ORAL
  Filled 2016-06-15 (×7): qty 1

## 2016-06-15 MED ORDER — NAPROXEN 500 MG PO TABS
500.0000 mg | ORAL_TABLET | Freq: Two times a day (BID) | ORAL | Status: DC | PRN
  Administered 2016-06-15 – 2016-06-16 (×2): 500 mg via ORAL
  Filled 2016-06-15 (×2): qty 1

## 2016-06-15 MED ORDER — LOPERAMIDE HCL 2 MG PO CAPS: 2.0000 mg | ORAL_CAPSULE | ORAL | Status: DC | PRN

## 2016-06-15 MED ORDER — ALUM & MAG HYDROXIDE-SIMETH 200-200-20 MG/5ML PO SUSP: 30.0000 mL | ORAL | Status: DC | PRN

## 2016-06-15 MED ORDER — LAMOTRIGINE 25 MG PO TABS
25.0000 mg | ORAL_TABLET | Freq: Every day | ORAL | Status: DC
  Administered 2016-06-16 – 2016-06-17 (×2): 25 mg via ORAL
  Filled 2016-06-15 (×2): qty 1

## 2016-06-15 MED ORDER — DICYCLOMINE HCL 20 MG PO TABS
20.0000 mg | ORAL_TABLET | Freq: Four times a day (QID) | ORAL | Status: DC | PRN
  Administered 2016-06-15 – 2016-06-16 (×3): 20 mg via ORAL
  Filled 2016-06-15 (×3): qty 1

## 2016-06-15 MED ORDER — GABAPENTIN 400 MG PO CAPS
400.0000 mg | ORAL_CAPSULE | Freq: Three times a day (TID) | ORAL | Status: DC
  Administered 2016-06-15 – 2016-06-17 (×5): 400 mg via ORAL
  Filled 2016-06-15 (×5): qty 1

## 2016-06-15 MED ORDER — ONDANSETRON 4 MG PO TBDP: 4.0000 mg | ORAL_TABLET | Freq: Four times a day (QID) | ORAL | Status: DC | PRN

## 2016-06-15 MED ORDER — METHOCARBAMOL 500 MG PO TABS
500.0000 mg | ORAL_TABLET | Freq: Three times a day (TID) | ORAL | Status: DC | PRN
  Administered 2016-06-15 – 2016-06-17 (×4): 500 mg via ORAL
  Filled 2016-06-15 (×4): qty 1

## 2016-06-15 MED ORDER — NICOTINE POLACRILEX 2 MG MT GUM
2.0000 mg | CHEWING_GUM | OROMUCOSAL | Status: DC | PRN
  Administered 2016-06-16 – 2016-06-17 (×5): 2 mg via ORAL
  Filled 2016-06-15 (×7): qty 1

## 2016-06-15 MED ORDER — SERTRALINE HCL 100 MG PO TABS
100.0000 mg | ORAL_TABLET | Freq: Every day | ORAL | Status: DC
  Administered 2016-06-16 – 2016-06-17 (×2): 100 mg via ORAL
  Filled 2016-06-15 (×2): qty 1

## 2016-06-15 MED ORDER — ACETAMINOPHEN 325 MG PO TABS: 650.0000 mg | ORAL_TABLET | Freq: Four times a day (QID) | ORAL | Status: DC | PRN

## 2016-06-15 MED ORDER — CLONIDINE HCL 0.1 MG PO TABS: 0.1000 mg | ORAL_TABLET | ORAL | Status: DC

## 2016-06-15 MED ORDER — SERTRALINE HCL 50 MG PO TABS: 100.0000 mg | ORAL_TABLET | Freq: Every day | ORAL | Status: DC

## 2016-06-15 MED ORDER — TRAZODONE HCL 100 MG PO TABS
100.0000 mg | ORAL_TABLET | Freq: Every evening | ORAL | Status: DC | PRN
  Administered 2016-06-15 – 2016-06-16 (×2): 100 mg via ORAL
  Filled 2016-06-15 (×2): qty 1

## 2016-06-15 MED ORDER — MAGNESIUM HYDROXIDE 400 MG/5ML PO SUSP: 30.0000 mL | Freq: Every day | ORAL | Status: DC | PRN

## 2016-06-15 MED ORDER — CLONIDINE HCL 0.1 MG PO TABS: 0.1000 mg | ORAL_TABLET | Freq: Every day | ORAL | Status: DC

## 2016-06-15 NOTE — Progress Notes (Signed)
Pt [facility] c/o "I want to quit doing drugs", denies current SI, pt does admit that he "doesn't remember jumping off anything in a suicide attempt", states "I was wasted so I don't know what happened", denies HI/AVH, contracts for safety, reports using heroin and ETOH and is requesting inpatient treatment/detox, would like to go to a residential treatment facility that provides Suboxen and/or methadone for "coming off heroin", skin/contraband search done, skin intact, no contraband found, pleasant and cooperative throughout the admission process, oriented to unit and rules, pt states that [facility]

## 2016-06-15 NOTE — BH Assessment (Signed)
[facility] Assessment Progress Note  Per Thedore MinsMojeed Akintayo, MD, this pt [facility].  Justin Heinrichina Tate, RN, [facility].  Pt has signed Voluntary Admission and Consent for Treatment, as well as Consent to Release Information his PCP, and signed [facility]  Pt's nurse, Justin Fisher, has been notified, and agrees to send original paperwork along with pt via Justin Fisher, and to call report to [phone] or [phone].  Justin Canninghomas Beda Dula, MA Triage Specialist [phone]

## 2016-06-15 NOTE — ED Notes (Signed)
Hourly rounding reveals patient sleeping in room. No complaints, stable, in no acute distress. Q15 minute rounds and monitoring via Security Cameras to continue. 

## 2016-06-15 NOTE — Progress Notes (Signed)
Pt was informed that [facility], [facility], and [facility] were inpatient facilities that offer treatment/detox. Pt stated that [facility] because [facility] has been there before and didn't like it. Pt stated that he is seeking information in facilities that provide methadone and if the facility did not offer that he did not want to go. Pt stated that he would just seek outpatient treatment when he leaves here. Pt did not sign a consent to release information for those places.

## 2016-06-15 NOTE — ED Notes (Signed)
Pt discharged ambulatory with [place] driver.  All belongings were sent with patient. 

## 2016-06-15 NOTE — H&P (Signed)
[facility]  Patient Identification: Cardale Dorer MRN:  [id number] Principal Diagnosis: MDD (major depressive disorder), recurrent severe, without psychosis ([place]) Diagnosis:        Patient Active Problem List   Diagnosis Date Noted  . MDD (major depressive disorder), recurrent severe, without psychosis ([place]) [F33.2] 09/11/2015    Priority: High  . Benzodiazepine abuse [F13.10] 06/14/2016  . Opiate dependence ([place]) [F11.20] 03/01/2016  . MDD (major depressive disorder) [F32.9] 02/29/2016  . Heroin use disorder, moderate, dependence ([place]) [F11.20] 01/01/2016  . Alcohol-induced depressive disorder with moderate or severe use disorder with onset during intoxication ([place]) [F10.24, F10.229, F32.89] 10/15/2015  . Alcohol use disorder, moderate, dependence ([place]) [F10.20] 09/11/2015    Total Time spent with patient: 30 minutes  Subjective:   Billie Intriago is a 30 y.o. male patient admitted with suicidal thoughts.  HPI: Correll Denbow a 30 y.o.malewith history of MDD and polysubstance who presents to the ED under IVC. Prior to coming to the hospital yesterday, patient reports worsening depression and suicidal thoughts due to his inability to stop using drugs. He states that he has not been compliant with his medication and instead have been self medicating with illicit drugs. Today, he denies SI/HI, psychosis, delusions but still feeling tired, hopeless and depressed. He was transferred to the [facility] for opiate detox and for referral to a residential treatment center. Patient states "I have been using synthetic fentanyl daily about half a gram. I had an overdose last month but my buddy used narcan on me. Sometimes I wished I would have died because it would be easier than fighting this addiction. I was on suboxone in the past and was sober for at least two years. I would like to be on methadone or that again. I am starting to have body aches and chills. I  borrowed a few xanax from a friend and I also drink a few beers per day. I am just hopeless about my drug use." [facility] requests that the Observation staff explore residential treatment options that also offer maintenance treatment initiation. Patient appears motivated to get help with his substance use. However, after his most recent Observation admission on 02/28/2016 the patient did not follow up at [facility] as he was referred stating "I went right back to using heroine." He has also been sporadic about taking his psychiatric medications. Patient will spend the night in the [place] unit and be seen again in the morning for further disposition.   Past Psychiatric History: As above  Risk to Self: Suicidal Ideation: denies Suicidal Intent: denies Is patient at risk for suicide?: No Suicidal Plan?: denies What has been your use of drugs/alcohol within the last 12 months?: Pt reports to using heroin, labs show positive for benzos Triggers for Past Attempts: Unknown Intentional Self Injurious Behavior:  (unknown) Risk to Others: Homicidal Ideation:  (UTA, pt agitated and refused to engage w/ assessor ) Thoughts of Harm to Others:  (unknown) Current Homicidal Intent:  (unknown) Current Homicidal Plan:  (unknown) Access to Homicidal Means:  (unknown) History of harm to others?:  (unknown) Assessment of Violence: On admission Violent Behavior Description: pt was aggressive and loud with hospital staff, yelling into the hallway and cursing at staff Does patient have access to weapons?:  (unknown) Criminal Charges Pending?:  (unknown) Does patient have a court date:  (unknown) Prior Inpatient Therapy: Prior Inpatient Therapy: Yes Prior Therapy Dates: 2017, 2016 Prior Therapy Facilty/Provider(s): [facility] Reason for Treatment: SI,  SA Prior Outpatient Therapy:  Prior Outpatient Therapy:  (unknown) Does patient have an ACCT team?: Unknown Does patient have Intensive In-House Services?  : Unknown Does  patient have [facility] services? : Unknown Does patient have P4CC services?: Unknown  Past Medical History:      Past Medical History:  Diagnosis Date  . Anxiety   . Suicidal thoughts    History reviewed. No pertinent surgical history. Family History: No family history on file. Family Psychiatric  History: Social History:     History  Alcohol Use  . Yes          History  Drug Use  . Types: IV    Comment: Uses heroin daily as well as xanax    Social History        Social History  . Marital status: Single    Spouse name: N/A  . Number of children: N/A  . Years of education: N/A         Social History Main Topics  . Smoking status: Current Every Day Smoker    Packs/day: 0.50  . Smokeless tobacco: Never Used  . Alcohol use Yes  . Drug use:     Types: IV     Comment: Uses heroin daily as well as xanax  . Sexual activity: Yes    Birth control/ protection: None       Other Topics Concern  . None      Social History Narrative  . None   Additional Social History:    Allergies:  No Known Allergies  Labs:  Lab Results Last 48 Hours        Results for orders placed or performed during the hospital encounter of 06/13/16 (from the past 48 hour(s))  Comprehensive metabolic panel     Status: Abnormal   Collection Time: 06/13/16  8:52 PM  Result Value Ref Range   Sodium 139 135 - 145 mmol/L   Potassium 3.5 3.5 - 5.1 mmol/L   Chloride 100 (L) 101 - 111 mmol/L   CO2 29 22 - 32 mmol/L   Glucose, Bld 86 65 - 99 mg/dL   BUN 10 6 - 20 mg/dL   Creatinine, Ser 0.76 0.61 - 1.24 mg/dL   Calcium 9.6 8.9 - 10.3 mg/dL   Total Protein 9.6 (H) 6.5 - 8.1 g/dL   Albumin 5.6 (H) 3.5 - 5.0 g/dL   AST 62 (H) 15 - 41 U/L   ALT 102 (H) 17 - 63 U/L   Alkaline Phosphatase 92 38 - 126 U/L   Total Bilirubin 0.7 0.3 - 1.2 mg/dL   GFR calc non Af Amer >60 >60 mL/min   GFR calc Af Amer >60 >60 mL/min    Comment: (NOTE) The eGFR has been  calculated using the CKD EPI equation. This calculation has not been validated in all clinical situations. eGFR's persistently <60 mL/min signify possible Chronic Kidney Disease.   Anion gap 10 5 - 15  Ethanol     Status: None   Collection Time: 06/13/16  8:52 PM  Result Value Ref Range   Alcohol, Ethyl (B) <5 <5 mg/dL    Comment:        LOWEST DETECTABLE LIMIT FOR SERUM ALCOHOL IS 5 mg/dL FOR MEDICAL PURPOSES ONLY  Salicylate level     Status: None   Collection Time: 06/13/16  8:52 PM  Result Value Ref Range   Salicylate Lvl <3.5 2.8 - 30.0 mg/dL  Acetaminophen level     Status: Abnormal   Collection Time: 06/13/16  8:52 PM  Result Value Ref Range   Acetaminophen (Tylenol), Serum <10 (L) 10 - 30 ug/mL    Comment:        THERAPEUTIC CONCENTRATIONS VARY SIGNIFICANTLY. A RANGE OF 10-30 ug/mL MAY BE AN EFFECTIVE CONCENTRATION FOR MANY PATIENTS. HOWEVER, SOME ARE BEST TREATED AT CONCENTRATIONS OUTSIDE THIS RANGE. ACETAMINOPHEN CONCENTRATIONS >150 ug/mL AT 4 HOURS AFTER INGESTION AND >50 ug/mL AT 12 HOURS AFTER INGESTION ARE OFTEN ASSOCIATED WITH TOXIC REACTIONS.  cbc     Status: Abnormal   Collection Time: 06/13/16  8:52 PM  Result Value Ref Range   WBC 21.7 (H) 4.0 - 10.5 K/uL   RBC 5.57 4.22 - 5.81 MIL/uL   Hemoglobin 15.7 13.0 - 17.0 g/dL   HCT 45.1 39.0 - 52.0 %   MCV 81.0 78.0 - 100.0 fL   MCH 28.2 26.0 - 34.0 pg   MCHC 34.8 30.0 - 36.0 g/dL   RDW 15.2 11.5 - 15.5 %   Platelets 392 150 - 400 K/uL  Urine culture     Status: None   Collection Time: 06/13/16  8:52 PM  Result Value Ref Range   Specimen Description URINE, RANDOM    Special Requests NONE    Culture NO GROWTH Performed at [facility]    Report Status 06/15/2016 FINAL   Rapid urine drug screen (hospital performed)     Status: Abnormal   Collection Time: 06/13/16  8:57 PM  Result Value Ref Range   Opiates NONE DETECTED NONE DETECTED   Cocaine NONE DETECTED  NONE DETECTED   Benzodiazepines POSITIVE (A) NONE DETECTED   Amphetamines NONE DETECTED NONE DETECTED   Tetrahydrocannabinol NONE DETECTED NONE DETECTED   Barbiturates NONE DETECTED NONE DETECTED    Comment:        DRUG SCREEN FOR MEDICAL PURPOSES ONLY.  IF CONFIRMATION IS NEEDED FOR ANY PURPOSE, NOTIFY LAB WITHIN 5 DAYS.        LOWEST DETECTABLE LIMITS FOR URINE DRUG SCREEN Drug Class       Cutoff (ng/mL) Amphetamine      1000 Barbiturate      200 Benzodiazepine   811 Tricyclics       914 Opiates          300 Cocaine          300 THC              50  Urinalysis, Routine w reflex microscopic     Status: None   Collection Time: 06/13/16 11:52 PM  Result Value Ref Range   Color, Urine YELLOW YELLOW   APPearance CLEAR CLEAR   Specific Gravity, Urine 1.010 1.005 - 1.030   pH 5.0 5.0 - 8.0   Glucose, UA NEGATIVE NEGATIVE mg/dL   Hgb urine dipstick NEGATIVE NEGATIVE   Bilirubin Urine NEGATIVE NEGATIVE   Ketones, ur NEGATIVE NEGATIVE mg/dL   Protein, ur NEGATIVE NEGATIVE mg/dL   Nitrite NEGATIVE NEGATIVE   Leukocytes, UA NEGATIVE NEGATIVE  I-Stat CG4 Lactic Acid, ED     Status: Abnormal   Collection Time: 06/14/16 12:21 AM  Result Value Ref Range   Lactic Acid, Venous 2.38 (HH) 0.5 - 1.9 mmol/L   Comment NOTIFIED PHYSICIAN   I-Stat CG4 Lactic Acid, ED     Status: None   Collection Time: 06/14/16  2:11 AM  Result Value Ref Range   Lactic Acid, Venous 1.39 0.5 - 1.9 mmol/L               Current Facility-Administered  Medications  Medication Dose Route Frequency Provider Last Rate Last Dose  . gabapentin (NEURONTIN) capsule 400 mg  400 mg Oral TID Gloriann Loan, PA-C   400 mg at 06/14/16 2153  . hydrOXYzine (ATARAX/VISTARIL) tablet 50 mg  50 mg Oral Q6H PRN Gloriann Loan, PA-C      . lamoTRIgine (LAMICTAL) tablet 25 mg  25 mg Oral Daily Gloriann Loan, PA-C   25 mg at 06/14/16 7948  . sertraline (ZOLOFT) tablet 200 mg  200 mg Oral Daily Gloriann Loan, PA-C   200  mg at 06/14/16 0165  . traZODone (DESYREL) tablet 100 mg  100 mg Oral QHS PRN Gloriann Loan, PA-C   100 mg at 06/14/16 2156         Current Outpatient Prescriptions  Medication Sig Dispense Refill  . gabapentin (NEURONTIN) 400 MG capsule Take 1 capsule (400 mg total) by mouth 3 (three) times daily. For agitation 90 capsule 0  . hydrOXYzine (ATARAX/VISTARIL) 50 MG tablet Take 1 tablet (50 mg total) by mouth every 6 (six) hours as needed for anxiety. 60 tablet 0  . ibuprofen (ADVIL,MOTRIN) 200 MG tablet Take 2 tablets (400 mg total) by mouth every 6 (six) hours as needed for headache. 30 tablet 0  . lamoTRIgine (LAMICTAL) 25 MG tablet Take 1 tablet (25 mg total) by mouth daily. For mood stabilization 30 tablet 0  . sertraline (ZOLOFT) 100 MG tablet Take 2 tablets (200 mg total) by mouth daily. For depression 60 tablet 0  . traZODone (DESYREL) 100 MG tablet Take 1 tablet (100 mg total) by mouth at bedtime as needed for sleep (may repeat x 1). For sleep 60 tablet 0  . nicotine polacrilex (NICORETTE) 2 MG gum Take 1 each (2 mg total) by mouth as needed for smoking cessation. (Patient not taking: Reported on 06/13/2016) 100 tablet 0    Musculoskeletal: Strength & Muscle Tone: within normal limits Gait & Station: normal Patient leans: N/A  Psychiatric Specialty Exam: Physical Exam  Psychiatric: His speech is normal and behavior is normal. Thought content normal. His affect is blunt. Cognition and memory are normal. He expresses impulsivity. He exhibits a depressed mood.    Review of Systems  Constitutional: Negative.   HENT: Negative.   Eyes: Negative.   Respiratory: Negative.   Cardiovascular: Negative.   Gastrointestinal: Negative.   Genitourinary: Negative.   Musculoskeletal: Positive for myalgias.  Skin: Negative.   Neurological: Negative.   Endo/Heme/Allergies: Negative.   Psychiatric/Behavioral: Positive for depression and substance abuse.    Blood pressure 130/66, pulse 84,  temperature 97.5 F (36.4 C), temperature source Oral, resp. rate 20, height _0  (1.702 m), weight 81.6 kg (180 lb), SpO2 100 %.Body mass index is 28.19 kg/m.  General Appearance: Disheveled  Eye Contact:  Good  Speech:  Clear and Coherent  Volume:  Normal  Mood:  Anxious and Dysphoric  Affect:  Constricted  Thought Process:  Coherent and Descriptions of Associations: Intact  Orientation:  Full (Time, Place, and Person)  Thought Content:  Logical  Suicidal Thoughts:  No  Homicidal Thoughts:  No  Memory:  Immediate;   Fair Recent;   Fair Remote;   Fair  Judgement:  Poor  Insight:  Shallow  Psychomotor Activity:  Psychomotor Retardation  Concentration:  Concentration: Fair and Attention Span: Fair  Recall:  [employer] of Knowledge:  Fair  Language:  Good  Akathisia:  No  Handed:  Right  AIMS (if indicated):     Assets:  Communication  Skills Desire for Improvement  ADL's:  Intact  Cognition:  WNL  Sleep:   fair     Treatment Plan Summary: Admit to the [place] unit for opiate detox and referral for inpatient substance abuse treatment center  Daily contact with patient to assess and evaluate symptoms and progress in treatment and Medication management  Continue Gabapentin 400 mg tid for aggression/drug withdrawal Continue Zoloft 100 mg daily for depression Continue Trazodone 100 mg qhs prn for sleep Continue Lamictal 25 mg daily for mood stabilization Start clonidine protocol for symptoms of opiate withdrawal due to reported daily use despite negative urine drug screen   Disposition: No evidence of imminent risk to self or others at present.   Recommened placement at [facility] O[facility] unit for stabilization  Elmarie Shiley, PMHNP-C 06/15/2016 15:32

## 2016-06-15 NOTE — Progress Notes (Signed)
D: Patient seen in bed awake. Continues to endorse depression 7/10. Denies SI/HI, AH/VH at this time. Patient stated "I want to get in for a long term treatment". Patient talked with the NT for disposition.  A: Staff offered support and encouragement. Due meds given as ordered. Continuous observation for safety maintained except when patient is in the bathroom.  R: Patient remains safe. Sleeping at this time.

## 2016-06-15 NOTE — Consult Note (Signed)
[facility] Consult   Reason for Consult: psychiatric evaluation Referring Physician:  EDP Patient Identification: [name] MRN:  [id number] Principal Diagnosis: MDD (major depressive disorder), recurrent severe, without psychosis ([place]) Diagnosis:   Patient Active Problem List   Diagnosis Date Noted  . MDD (major depressive disorder), recurrent severe, without psychosis (Justin Fisher) [F33.2] 09/11/2015    Priority: High  . Benzodiazepine abuse [F13.10] 06/14/2016  . Opiate dependence ([place]) [F11.20] 03/01/2016  . MDD (major depressive disorder) [F32.9] 02/29/2016  . Heroin use disorder, moderate, dependence ([place]) [F11.20] 01/01/2016  . Alcohol-induced depressive disorder with moderate or severe use disorder with onset during intoxication ([place]) [F10.24, F10.229, F32.89] 10/15/2015  . Alcohol use disorder, moderate, dependence ([place]) [F10.20] 09/11/2015    Total Time spent with patient: 45 minutes  Subjective:   Justin Fisher is a 30 y.o. male patient admitted with suicidal thoughts.  HPI: Justin Fisher is a 30 y.o. male with history of MDD and polysubstance who presents to the ED under IVC. Prior to coming to the hospital yesterday, patient reports worsening depression and suicidal thoughts due to his inability to stop using drugs. He states that he has not been compliant with his medication and instead have been self medicating with illicit drugs. Today, he denies SI/HI, psychosis, delusions but still feeling tired, hopeless and depressed.  Past Psychiatric History: As above  Risk to Self: Suicidal Ideation: denies Suicidal Intent: denies Is patient at risk for suicide?: No Suicidal Plan?: denies What has been your use of drugs/alcohol within the last 12 months?: Pt reports to using heroin, labs show positive for benzos Triggers for Past Attempts: Unknown Intentional Self Injurious Behavior:  (unknown) Risk to Others: Homicidal Ideation:  (UTA, pt agitated and  refused to engage w/ assessor ) Thoughts of Harm to Others:  (unknown) Current Homicidal Intent:  (unknown) Current Homicidal Plan:  (unknown) Access to Homicidal Means:  (unknown) History of harm to others?:  (unknown) Assessment of Violence: On admission Violent Behavior Description: pt was aggressive and loud with hospital staff, yelling into the hallway and cursing at staff Does patient have access to weapons?:  (unknown) Criminal Charges Pending?:  (unknown) Does patient have a court date:  (unknown) Prior Inpatient Therapy: Prior Inpatient Therapy: Yes Prior Therapy Dates: 2017, 2016 Prior Therapy Facilty/Provider(s): [facility] Reason for Treatment: SI,  SA Prior Outpatient Therapy: Prior Outpatient Therapy:  (unknown) Does patient have an ACCT team?: Unknown Does patient have Intensive In-House Services?  : Unknown Does patient have [facility] services? : Unknown Does patient have P4CC services?: Unknown  Past Medical History:  Past Medical History:  Diagnosis Date  . Anxiety   . Suicidal thoughts    History reviewed. No pertinent surgical history. Family History: No family history on file. Family Psychiatric  History: Social History:  History  Alcohol Use  . Yes     History  Drug Use  . Types: IV    Comment: Uses heroin daily as well as xanax    Social History   Social History  . Marital status: Single    Spouse name: N/A  . Number of children: N/A  . Years of education: N/A   Social History Main Topics  . Smoking status: Current Every Day Smoker    Packs/day: 0.50  . Smokeless tobacco: Never Used  . Alcohol use Yes  . Drug use:     Types: IV     Comment: Uses heroin daily as well as xanax  . Sexual activity: Yes  Birth control/ protection: None   Other Topics Concern  . None   Social History Narrative  . None   Additional Social History:    Allergies:  No Known Allergies  Labs:  Results for orders placed or performed during the hospital  encounter of 06/13/16 (from the past 48 hour(s))  Comprehensive metabolic panel     Status: Abnormal   Collection Time: 06/13/16  8:52 PM  Result Value Ref Range   Sodium 139 135 - 145 mmol/L   Potassium 3.5 3.5 - 5.1 mmol/L   Chloride 100 (L) 101 - 111 mmol/L   CO2 29 22 - 32 mmol/L   Glucose, Bld 86 65 - 99 mg/dL   BUN 10 6 - 20 mg/dL   Creatinine, Ser 0.76 0.61 - 1.24 mg/dL   Calcium 9.6 8.9 - 10.3 mg/dL   Total Protein 9.6 (H) 6.5 - 8.1 g/dL   Albumin 5.6 (H) 3.5 - 5.0 g/dL   AST 62 (H) 15 - 41 U/L   ALT 102 (H) 17 - 63 U/L   Alkaline Phosphatase 92 38 - 126 U/L   Total Bilirubin 0.7 0.3 - 1.2 mg/dL   GFR calc non Af Amer >60 >60 mL/min   GFR calc Af Amer >60 >60 mL/min    Comment: (NOTE) The eGFR has been calculated using the CKD EPI equation. This calculation has not been validated in all clinical situations. eGFR's persistently <60 mL/min signify possible Chronic Kidney Disease.    Anion gap 10 5 - 15  Ethanol     Status: None   Collection Time: 06/13/16  8:52 PM  Result Value Ref Range   Alcohol, Ethyl (B) <5 <5 mg/dL    Comment:        LOWEST DETECTABLE LIMIT FOR SERUM ALCOHOL IS 5 mg/dL FOR MEDICAL PURPOSES ONLY   Salicylate level     Status: None   Collection Time: 06/13/16  8:52 PM  Result Value Ref Range   Salicylate Lvl <7.0 2.8 - 30.0 mg/dL  Acetaminophen level     Status: Abnormal   Collection Time: 06/13/16  8:52 PM  Result Value Ref Range   Acetaminophen (Tylenol), Serum <10 (L) 10 - 30 ug/mL    Comment:        THERAPEUTIC CONCENTRATIONS VARY SIGNIFICANTLY. A RANGE OF 10-30 ug/mL MAY BE AN EFFECTIVE CONCENTRATION FOR MANY PATIENTS. HOWEVER, SOME ARE BEST TREATED AT CONCENTRATIONS OUTSIDE THIS RANGE. ACETAMINOPHEN CONCENTRATIONS >150 ug/mL AT 4 HOURS AFTER INGESTION AND >50 ug/mL AT 12 HOURS AFTER INGESTION ARE OFTEN ASSOCIATED WITH TOXIC REACTIONS.   cbc     Status: Abnormal   Collection Time: 06/13/16  8:52 PM  Result Value Ref Range    WBC 21.7 (H) 4.0 - 10.5 K/uL   RBC 5.57 4.22 - 5.81 MIL/uL   Hemoglobin 15.7 13.0 - 17.0 g/dL   HCT 45.1 39.0 - 52.0 %   MCV 81.0 78.0 - 100.0 fL   MCH 28.2 26.0 - 34.0 pg   MCHC 34.8 30.0 - 36.0 g/dL   RDW 15.2 11.5 - 15.5 %   Platelets 392 150 - 400 K/uL  Urine culture     Status: None   Collection Time: 06/13/16  8:52 PM  Result Value Ref Range   Specimen Description URINE, RANDOM    Special Requests NONE    Culture NO GROWTH Performed at [facility]     Report Status 06/15/2016 FINAL   Rapid urine drug screen (hospital performed)  Status: Abnormal   Collection Time: 06/13/16  8:57 PM  Result Value Ref Range   Opiates NONE DETECTED NONE DETECTED   Cocaine NONE DETECTED NONE DETECTED   Benzodiazepines POSITIVE (A) NONE DETECTED   Amphetamines NONE DETECTED NONE DETECTED   Tetrahydrocannabinol NONE DETECTED NONE DETECTED   Barbiturates NONE DETECTED NONE DETECTED    Comment:        DRUG SCREEN FOR MEDICAL PURPOSES ONLY.  IF CONFIRMATION IS NEEDED FOR ANY PURPOSE, NOTIFY LAB WITHIN 5 DAYS.        LOWEST DETECTABLE LIMITS FOR URINE DRUG SCREEN Drug Class       Cutoff (ng/mL) Amphetamine      1000 Barbiturate      200 Benzodiazepine   122 Tricyclics       482 Opiates          300 Cocaine          300 THC              50   Urinalysis, Routine w reflex microscopic     Status: None   Collection Time: 06/13/16 11:52 PM  Result Value Ref Range   Color, Urine YELLOW YELLOW   APPearance CLEAR CLEAR   Specific Gravity, Urine 1.010 1.005 - 1.030   pH 5.0 5.0 - 8.0   Glucose, UA NEGATIVE NEGATIVE mg/dL   Hgb urine dipstick NEGATIVE NEGATIVE   Bilirubin Urine NEGATIVE NEGATIVE   Ketones, ur NEGATIVE NEGATIVE mg/dL   Protein, ur NEGATIVE NEGATIVE mg/dL   Nitrite NEGATIVE NEGATIVE   Leukocytes, UA NEGATIVE NEGATIVE  I-Stat CG4 Lactic Acid, ED     Status: Abnormal   Collection Time: 06/14/16 12:21 AM  Result Value Ref Range   Lactic Acid, Venous 2.38 (HH) 0.5 -  1.9 mmol/L   Comment NOTIFIED PHYSICIAN   I-Stat CG4 Lactic Acid, ED     Status: None   Collection Time: 06/14/16  2:11 AM  Result Value Ref Range   Lactic Acid, Venous 1.39 0.5 - 1.9 mmol/L    Current Facility-Administered Medications  Medication Dose Route Frequency Provider Last Rate Last Dose  . gabapentin (NEURONTIN) capsule 400 mg  400 mg Oral TID Gloriann Loan, PA-C   400 mg at 06/14/16 2153  . hydrOXYzine (ATARAX/VISTARIL) tablet 50 mg  50 mg Oral Q6H PRN Gloriann Loan, PA-C      . lamoTRIgine (LAMICTAL) tablet 25 mg  25 mg Oral Daily Gloriann Loan, PA-C   25 mg at 06/14/16 5003  . sertraline (ZOLOFT) tablet 200 mg  200 mg Oral Daily Gloriann Loan, PA-C   200 mg at 06/14/16 7048  . traZODone (DESYREL) tablet 100 mg  100 mg Oral QHS PRN Gloriann Loan, PA-C   100 mg at 06/14/16 2156   Current Outpatient Prescriptions  Medication Sig Dispense Refill  . gabapentin (NEURONTIN) 400 MG capsule Take 1 capsule (400 mg total) by mouth 3 (three) times daily. For agitation 90 capsule 0  . hydrOXYzine (ATARAX/VISTARIL) 50 MG tablet Take 1 tablet (50 mg total) by mouth every 6 (six) hours as needed for anxiety. 60 tablet 0  . ibuprofen (ADVIL,MOTRIN) 200 MG tablet Take 2 tablets (400 mg total) by mouth every 6 (six) hours as needed for headache. 30 tablet 0  . lamoTRIgine (LAMICTAL) 25 MG tablet Take 1 tablet (25 mg total) by mouth daily. For mood stabilization 30 tablet 0  . sertraline (ZOLOFT) 100 MG tablet Take 2 tablets (200 mg total) by mouth daily. For depression 60  tablet 0  . traZODone (DESYREL) 100 MG tablet Take 1 tablet (100 mg total) by mouth at bedtime as needed for sleep (may repeat x 1). For sleep 60 tablet 0  . nicotine polacrilex (NICORETTE) 2 MG gum Take 1 each (2 mg total) by mouth as needed for smoking cessation. (Patient not taking: Reported on 06/13/2016) 100 tablet 0    Musculoskeletal: Strength & Muscle Tone: within normal limits Gait & Station: normal Patient leans:  N/A  Psychiatric Specialty Exam: Physical Exam  Psychiatric: His speech is normal and behavior is normal. Thought content normal. His affect is blunt. Cognition and memory are normal. He expresses impulsivity. He exhibits a depressed mood.    Review of Systems  Constitutional: Negative.   HENT: Negative.   Eyes: Negative.   Respiratory: Negative.   Cardiovascular: Negative.   Gastrointestinal: Negative.   Genitourinary: Negative.   Musculoskeletal: Positive for myalgias.  Skin: Negative.   Neurological: Negative.   Endo/Heme/Allergies: Negative.   Psychiatric/Behavioral: Positive for depression and substance abuse.    Blood pressure 130/66, pulse 84, temperature 97.5 F (36.4 C), temperature source Oral, resp. rate 20, height 5' 7"  (1.702 m), weight 81.6 kg (180 lb), SpO2 100 %.Body mass index is 28.19 kg/m.  General Appearance: Disheveled  Eye Contact:  Good  Speech:  Clear and Coherent  Volume:  Normal  Mood:  Anxious and Dysphoric  Affect:  Constricted  Thought Process:  Coherent and Descriptions of Associations: Intact  Orientation:  Full (Time, Place, and Person)  Thought Content:  Logical  Suicidal Thoughts:  No  Homicidal Thoughts:  No  Memory:  Immediate;   Fair Recent;   Fair Remote;   Fair  Judgement:  Poor  Insight:  Shallow  Psychomotor Activity:  Psychomotor Retardation  Concentration:  Concentration: Fair and Attention Span: Fair  Recall:  [employer] of Knowledge:  Fair  Language:  Good  Akathisia:  No  Handed:  Right  AIMS (if indicated):     Assets:  Communication Skills Desire for Improvement  ADL's:  Intact  Cognition:  WNL  Sleep:   fair     Treatment Plan Summary: Daily contact with patient to assess and evaluate symptoms and progress in treatment and Medication management  Continue Gabapentin 400 mg tid for aggression/drug withdrawal Continue Zoloft 100 mg daily for depression Continue Trazodone 100 mg qhs prn for sleep Continue  Lamictal 25 mg daily for mood stabilization  Disposition: No evidence of imminent risk to self or others at present.   Recommened placement at [facility] OBS unit for stabilization  Corena Pilgrim, MD 06/15/2016 10:27 AM

## 2016-06-16 DIAGNOSIS — F1024 Alcohol dependence with alcohol-induced mood disorder: Secondary | ICD-10-CM | POA: Diagnosis not present

## 2016-06-16 DIAGNOSIS — F329 Major depressive disorder, single episode, unspecified: Secondary | ICD-10-CM | POA: Diagnosis not present

## 2016-06-16 NOTE — Progress Notes (Signed)
[facility] due to substance abuse and depression. He was re-started on his psychiatric medications. Today patient continued to endorse passive suicidal thoughts and endorsed withdrawal symptoms from opiate use. Onalee HuaDavid appeared very restless during assessment and was unable to contract for his safety. He reported that prior to admission that he has "thought about jumping off a roof. I just feel so hopeless. I have not accomplished very much in my life so far." The patient also appears very uncertain about treatment options stating "I'm just not sure. I feel so confused. I want inpatient but then I think outpatient would be fine. I have been trying to get some guidance from my father about what to do. Patient encouraged to speak with Observation Unit staff this evening and was informed that his discharge date would be set for tomorrow morning 06/17/2016. He appeared depressed during the assessment and continues to feel hopeless regarding his substance abuse problems.   Treatment Plan Summary:  [facility]Observation unit for opiate detox and referral for inpatient substance abuse treatment center  Daily contact with patient to assess and evaluate symptoms and progress in treatment and Medication management Continue Gabapentin 400 mg tid for aggression/drug withdrawal Continue Zoloft 100 mg daily for depression Continue Trazodone 100 mg qhs prn for sleep Continue Lamictal 25 mg daily for mood stabilization Continue clonidine protocol for symptoms of opiate withdrawal due to reported daily use despite negative urine drug screen

## 2016-06-16 NOTE — Progress Notes (Signed)
D: Patient seen talking on phone during the shift change. Appear anxious. Patient requested to get a number from his locker. Staff took him to the search room with the Engineer, materialssecurity officer. Patient seems relaxed after the call. Denies pain, SI, AH/VH at this time. Endorses depression of 3/10 and anxiety 5/10. No behavioral issues noted.  A: Staff offered support and encouragement as needed. Due meds given as ordered. Routine safety check for safety maintained. Will continue to monitor patient. R: Patient remains safe.

## 2016-06-16 NOTE — Progress Notes (Signed)
Pt sleeping between intervals throughout the day. Pt showed no interest when this nurse discussed different treatment programs. No calls made to treatment facilities but pt did call his family. Mood sullen but denies SI/HI/AVH. In no distress.

## 2016-06-16 NOTE — Progress Notes (Signed)
This Clinical research associatewriter introduced self to patient; patient promptly stated he needed to get another number out of his cell phone as the number he was trying was not working. Patient appeared anxious and voiced concern for his girlfriend stating "If shes not answering she might be locked up." This Clinical research associatewriter obtained permission from nightshift RN to take patient to locker and get any phone numbers he needs out of his cell phone 1 and only 1 time. Security guard was taken with this Clinical research associatewriter and patient to search room where patient obtained phone number. Patient called friends number and was able to reach someone at this number. Patient appeared relieved after getting ahold of friend and being able to talk to him about discharge tomorrow.

## 2016-06-16 NOTE — Progress Notes (Signed)
This Clinical research associatewriter gave pt a list of inpatient and outpatient substance abuse facilities. This Clinical research associatewriter explained to patient that if needs to make any calls or appointments he can and staff would help him if provided his written and verbal consent. Pt was receptive to all information between he and this Clinical research associatewriter and proceeded to look at the handout of substance abuse facilities given to him by this Clinical research associatewriter.

## 2016-06-16 NOTE — Progress Notes (Signed)
This Clinical research associatewriter spoke with patient about his plans post discharge. Pt stated that he wants to go home when he discharges due to being able to see his mother. Staff stated to patient that if he wanted our help to make referrals or speak with any facilities we would have to get his written and verbal consent. Pt. Refused to provided written consent to relaese any of his information. Pt also states that he wants to go and find treatment on his own once he gets home. Pt. Asked staff if he could use his phone to retrieve some phone numbers and staff informed pt about the unit protocols. Pt stated that he may need bus passes when discharging. Staff informed pt that they provide bus passes to patients if needed. Pt was receptive to all communication between he and observation unit staff.

## 2016-06-17 DIAGNOSIS — F17210 Nicotine dependence, cigarettes, uncomplicated: Secondary | ICD-10-CM

## 2016-06-17 DIAGNOSIS — Z79899 Other long term (current) drug therapy: Secondary | ICD-10-CM | POA: Diagnosis not present

## 2016-06-17 DIAGNOSIS — F329 Major depressive disorder, single episode, unspecified: Secondary | ICD-10-CM

## 2016-06-17 DIAGNOSIS — F1120 Opioid dependence, uncomplicated: Secondary | ICD-10-CM | POA: Diagnosis not present

## 2016-06-17 DIAGNOSIS — F1024 Alcohol dependence with alcohol-induced mood disorder: Secondary | ICD-10-CM | POA: Diagnosis not present

## 2016-06-17 MED ORDER — NICOTINE POLACRILEX 2 MG MT GUM: 2.0000 mg | CHEWING_GUM | OROMUCOSAL | 0 refills | Status: DC | PRN

## 2016-06-17 MED ORDER — LAMOTRIGINE 25 MG PO TABS: 25.0000 mg | ORAL_TABLET | Freq: Every day | ORAL | 0 refills | Status: DC

## 2016-06-17 MED ORDER — TRAZODONE HCL 100 MG PO TABS: 100.0000 mg | ORAL_TABLET | Freq: Every evening | ORAL | 0 refills | Status: DC | PRN

## 2016-06-17 MED ORDER — SERTRALINE HCL 100 MG PO TABS: 200.0000 mg | ORAL_TABLET | Freq: Every day | ORAL | 0 refills | Status: DC

## 2016-06-17 MED ORDER — HYDROXYZINE HCL 50 MG PO TABS: 50.0000 mg | ORAL_TABLET | Freq: Four times a day (QID) | ORAL | 0 refills | Status: DC | PRN

## 2016-06-17 MED ORDER — GABAPENTIN 400 MG PO CAPS: 400.0000 mg | ORAL_CAPSULE | Freq: Three times a day (TID) | ORAL | 0 refills | Status: DC

## 2016-06-17 NOTE — Progress Notes (Signed)
This Clinical research associatewriter spoke to the pt about his goals for after discharge. Pt communicated he wants to see a counselor for his addiction to drugs. This Clinical research associatewriter provided the patient with a list of outpatient resources. Pt also spoke to the NP about medication management to help support him. Pt will be going to his house after his discharge at noon. Carmell Austriaisha Vanetta Rule 06/17/2016 11:10AM

## 2016-06-17 NOTE — Progress Notes (Signed)
Pt discharged from Obs to home @ 1155 without incident. Pt alert and ambulatory.Denied SI/HI/AVH and was in a jovial mood. All personal belongings returned to pt with AVS and Rx as well as discharge resources given.

## 2016-06-17 NOTE — Discharge Summary (Signed)
[facility] Summary [facility]  Patient:  Justin McardleDavid Taber is an 30 y.o., male MRN:  [id number] DOB:  March 01, 1986 Patient phone:  [phone] (home)  Patient address:   [address] [place] [address],  Total Time spent with patient: 20 minutes  Date of Admission:  06/15/2016 Date of Discharge: 06/17/2016  Reason for Admission: Substance abuse and depression   Principal Problem: Heroin use disorder, moderate, dependence (HCC) Discharge Diagnoses: Patient Active Problem List   Diagnosis Date Noted  . Depressive disorder [F32.9] 02/29/2016    Priority: High  . Heroin use disorder, moderate, dependence (HCC) [F11.20] 01/01/2016    Priority: High  . Benzodiazepine abuse [F13.10] 06/14/2016  . Opiate dependence (HCC) [F11.20] 03/01/2016  . 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 Alcohol use disorder, moderate, dependence (HCC) [F10.20] 09/11/2015    Past Psychiatric History: Opiate dependence, Depression, Benzo abuse, Anxiety  Past Medical History:  Past Medical History:  Diagnosis Date  . Anxiety   . Suicidal thoughts    History reviewed. No pertinent surgical history. Family History: History reviewed. No pertinent family history. Family Psychiatric  History: Unknown Social History:  History  Alcohol Use  . Yes     History  Drug Use  . Types: IV    Comment: Uses heroin daily as well as xanax    Social History   Social History  . Marital status: Single    Spouse name: N/A  . Number of children: N/A  . Years of education: N/A   Social History Main Topics  . Smoking status: Current Every Day Smoker    Packs/day: 0.50  . Smokeless tobacco: Never Used  . Alcohol use Yes  . Drug use:     Types: IV     Comment: Uses heroin daily as well as xanax  . Sexual activity: Yes    Birth control/ protection: None   Other Topics Concern  .  None   Social History Narrative  . None    Hospital Course:  Justin Fisher is a 30 year old male who spent two nights in the observation unit without incident. Pt stated he is feeling much better today and is ready to discharge home. Pt has a plan to follow up with [facility] for methadone treatment and will seek outpatient psychiatry and therapy for medication management and counseling. Pt stated he has managed in the past to stay clean by taking Suboxone and/or methadone and he understands he needs to get clean and stay clean. Pt was given outpatient resources and prescriptions.  Physical Findings: AIMS:  , ,  ,  ,    CIWA:  CIWA-Ar Total: 1 COWS:  COWS Total Score: 3  Musculoskeletal: Strength & Muscle Tone: within normal limits Gait & Station: normal Patient leans: N/A  Psychiatric Specialty Exam: Physical Exam  Review of Systems  Psychiatric/Behavioral: Positive for depression and substance abuse. Negative for hallucinations, memory loss and suicidal ideas. The patient is nervous/anxious. The patient does not have insomnia.   All other systems reviewed and are negative.   Blood pressure 116/72, pulse 64, temperature 98.3 F (36.8 C), temperature source Oral, resp. rate 16, height 5\' 7"  (1.702 m), weight 78 kg (172 lb), SpO2 98 %.Body mass index is 26.94 kg/m.  General Appearance: Casual  Eye Contact:  Good  Speech:  Clear and Coherent and Normal Rate  Volume:  Normal  Mood:  Depressed  Affect:  Congruent and Depressed  Thought Process:  Coherent, Goal Directed and Linear  Orientation:  Full (Time, Place, and Person)  Thought Content:  Logical  Suicidal Thoughts:  No  Homicidal Thoughts:  No  Memory:  Immediate;   Good Recent;   Good Remote;   Fair  Judgement:  Fair  Insight:  Good  Psychomotor Activity:  Normal  Concentration:  Concentration: Good and Attention Span: Good  Recall:  Good  Fund of Knowledge:  Good  Language:  Good  Akathisia:  No  Handed:  Right  AIMS  (if indicated):     Assets:  Communication Skills Desire for Improvement Financial Resources/Insurance Housing Physical Health Resilience Social Support  ADL's:  Intact  Cognition:  WNL  Sleep:           Has this patient used any form of tobacco in the last 30 days? (Cigarettes, Smokeless Tobacco, Cigars, and/or Pipes) Yes, prescription provided to pt for nicoderm  Blood Alcohol level:  Lab Results  Component Value Date   [facility] <5 06/13/2016   ETH <5 02/28/2016    Metabolic Disorder Labs:  Lab Results  Component Value Date   HGBA1C 6.5 (H) 09/12/2015   MPG 140 09/12/2015   No results found for: PROLACTIN Lab Results  Component Value Date   CHOL 215 (H) 09/12/2015   TRIG 275 (H) 09/12/2015   HDL 32 (L) 09/12/2015   CHOLHDL 6.7 09/12/2015   VLDL 55 (H) 09/12/2015   LDLCALC 128 (H) 09/12/2015    See Psychiatric Specialty Exam and Suicide Risk Assessment completed by Attending Physician prior to discharge.  Discharge destination:  Home  Is patient on multiple antipsychotic therapies at discharge:  No   Do you recommend tapering to monotherapy for antipsychotics?  No    Has Patient had three or more failed trials of antipsychotic monotherapy by history:  No  Recommended Plan for Multiple Antipsychotic Therapies: NA    Medication List    STOP taking these medications   ibuprofen 200 MG tablet Commonly known as:  ADVIL,MOTRIN     TAKE these medications     Indication  gabapentin 400 MG capsule Commonly known as:  NEURONTIN Take 1 capsule (400 mg total) by mouth 3 (three) times daily. For agitation  Indication:  Agitation   hydrOXYzine 50 MG tablet Commonly known as:  ATARAX/VISTARIL Take 1 tablet (50 mg total) by mouth every 6 (six) hours as needed for anxiety.  Indication:  Anxiety Neurosis, Anxiety   lamoTRIgine 25 MG tablet Commonly known as:  LAMICTAL Take 1 tablet (25 mg total) by mouth daily. For mood stabilization  Indication:  Depression    nicotine polacrilex 2 MG gum Commonly known as:  NICORETTE Take 1 each (2 mg total) by mouth as needed for smoking cessation.  Indication:  Nicotine Addiction   sertraline 100 MG tablet Commonly known as:  ZOLOFT Take 2 tablets (200 mg total) by mouth daily. For depression  Indication:  Major Depressive Disorder   traZODone 100 MG tablet Commonly known as:  DESYREL Take 1 tablet (100 mg total) by mouth at bedtime as needed for sleep (may repeat x 1). For sleep  Indication:  Trouble Sleeping        Follow-up recommendations:  Activity:  As tolerated Diet:  Regular Other:  Follow up with PCP for any new or existing medical problems  Comments:  Pt left observation unit with all belongings and in stable condition.  Prescriptions provided: Lamictal 25 mg qd Gabapentin 400  mg TID Vistaril 50 mg Q6hr PRN Nicorette Gum 2 mg PRN Zoloft 200 mg QD Trazadone 50 mg QHS  Pt will follow up with [facility] and outpatient psychiatry/therapy  Signed: Laveda AbbeLaurie Britton Parks, NP 06/17/2016, 11:14 AM

## 2016-06-17 NOTE — Progress Notes (Signed)
Pt received alert and cooperative. Received orders for discharge. Verbalized a desire to go home but also states that the people that lives in his house are IV drug users but he will made an ernest effort to stop using drugs himself. Provided emotional support and advised pt to utilize resources in the community that can help his abstinence from using drugs. Pt verbalized understanding. Pt denies SI/HI/AVH and contracts for safety.

## 2016-06-19 LAB — CULTURE, BLOOD (ROUTINE X 2)
Culture: NO GROWTH
Culture: NO GROWTH

## 2017-03-09 ENCOUNTER — Emergency Department (HOSPITAL_COMMUNITY)
Admission: EM | Admit: 2017-03-09 | Discharge: 2017-03-09 | Discharge status: Absent without leave | Payer: BLUE CROSS/BLUE SHIELD

## 2017-03-10 ENCOUNTER — Emergency Department (HOSPITAL_COMMUNITY)
Admission: EM | Admit: 2017-03-10 | Discharge: 2017-03-10 | Disposition: A | Discharge status: Home / Self Care | Payer: BLUE CROSS/BLUE SHIELD | Attending: Emergency Medicine | Admitting: Emergency Medicine

## 2017-03-10 ENCOUNTER — Encounter (HOSPITAL_COMMUNITY): Payer: Self-pay | Admitting: *Deleted

## 2017-03-10 DIAGNOSIS — Z23 Encounter for immunization: Secondary | ICD-10-CM | POA: Diagnosis not present

## 2017-03-10 DIAGNOSIS — F17200 Nicotine dependence, unspecified, uncomplicated: Secondary | ICD-10-CM | POA: Diagnosis not present

## 2017-03-10 DIAGNOSIS — L03113 Cellulitis of right upper limb: Secondary | ICD-10-CM | POA: Diagnosis not present

## 2017-03-10 DIAGNOSIS — Z79899 Other long term (current) drug therapy: Secondary | ICD-10-CM | POA: Insufficient documentation

## 2017-03-10 DIAGNOSIS — F1110 Opioid abuse, uncomplicated: Secondary | ICD-10-CM | POA: Diagnosis not present

## 2017-03-10 DIAGNOSIS — L0291 Cutaneous abscess, unspecified: Secondary | ICD-10-CM

## 2017-03-10 DIAGNOSIS — L02413 Cutaneous abscess of right upper limb: Secondary | ICD-10-CM | POA: Diagnosis not present

## 2017-03-10 LAB — BASIC METABOLIC PANEL
Anion gap: 7 (ref 5–15)
BUN: 13 mg/dL (ref 6–20)
CHLORIDE: 101 mmol/L (ref 101–111)
CO2: 28 mmol/L (ref 22–32)
CREATININE: 1.04 mg/dL (ref 0.61–1.24)
Calcium: 9.4 mg/dL (ref 8.9–10.3)
GFR calc non Af Amer: >60 mL/min (ref >60)
Glucose, Bld: 83 mg/dL (ref 65–99)
POTASSIUM: 4.2 mmol/L (ref 3.5–5.1)
SODIUM: 136 mmol/L (ref 135–145)

## 2017-03-10 LAB — CBC WITH DIFFERENTIAL/PLATELET
BASOS PCT: 1 %
Basophils Absolute: 0.1 10*3/uL (ref 0.0–0.1)
Eosinophils Absolute: 0.4 10*3/uL (ref 0.0–0.7)
Eosinophils Relative: 4 %
HEMATOCRIT: 46.8 % (ref 39.0–52.0)
HEMOGLOBIN: 15.5 g/dL (ref 13.0–17.0)
LYMPHS ABS: 5.6 10*3/uL — AB (ref 0.7–4.0)
Lymphocytes Relative: 46 %
MCH: 30.3 pg (ref 26.0–34.0)
MCHC: 33.1 g/dL (ref 30.0–36.0)
MCV: 91.4 fL (ref 78.0–100.0)
MONOS PCT: 7 %
Monocytes Absolute: 0.8 10*3/uL (ref 0.1–1.0)
NEUTROS PCT: 42 %
Neutro Abs: 5 10*3/uL (ref 1.7–7.7)
Platelets: 365 10*3/uL (ref 150–400)
RBC: 5.12 MIL/uL (ref 4.22–5.81)
RDW: 12.8 % (ref 11.5–15.5)
WBC: 11.9 10*3/uL — AB (ref 4.0–10.5)

## 2017-03-10 LAB — I-STAT CG4 LACTIC ACID, ED: LACTIC ACID, VENOUS: 0.66 mmol/L (ref 0.5–1.9)

## 2017-03-10 MED ORDER — CLINDAMYCIN PHOSPHATE 600 MG/50ML IV SOLN
600.0000 mg | Freq: Once | INTRAVENOUS | Status: AC
  Administered 2017-03-10: 600 mg via INTRAVENOUS
  Filled 2017-03-10: qty 50

## 2017-03-10 MED ORDER — CLINDAMYCIN HCL 150 MG PO CAPS
450.0000 mg | ORAL_CAPSULE | Freq: Three times a day (TID) | ORAL | 0 refills | Status: DC
Start: 2017-03-10 — End: 2021-07-09

## 2017-03-10 MED ORDER — TETANUS-DIPHTH-ACELL PERTUSSIS 5-2.5-18.5 LF-MCG/0.5 IM SUSP
0.5000 mL | Freq: Once | INTRAMUSCULAR | Status: AC
  Administered 2017-03-10: 0.5 mL via INTRAMUSCULAR
  Filled 2017-03-10: qty 0.5

## 2017-03-10 MED ORDER — LIDOCAINE HCL (PF) 1 % IJ SOLN
5.0000 mL | Freq: Once | INTRAMUSCULAR | Status: AC
  Administered 2017-03-10: 5 mL via INTRADERMAL
  Filled 2017-03-10: qty 5

## 2017-03-10 MED ORDER — SODIUM CHLORIDE 0.9 % IV BOLUS (SEPSIS)
1000.0000 mL | Freq: Once | INTRAVENOUS | Status: AC
  Administered 2017-03-10: 1000 mL via INTRAVENOUS

## 2017-03-10 NOTE — ED Notes (Signed)
Suture cart and I&D tray at bedside 

## 2017-03-10 NOTE — Discharge Instructions (Signed)
°  Take your antibiotics as directed and to completion. You should never have any leftover antibiotics! Push fluids and stay well hydrated.   Return to the emergency room for wound check and packing removal in 48 hours.        If you develop fever, have vomiting or if the swelling and redness starts spreading , return to the emergency room immediately for a recheck.

## 2017-03-10 NOTE — ED Notes (Signed)
The pt has an abscess rt upper arm with redness  He has multiple little abscesses up and down both his arms  Over veins   He admits to iv drug use

## 2017-03-10 NOTE — ED Provider Notes (Signed)
[facility]-EMERGENCY DEPT Provider Note   CSN: [id number] Arrival date & time: 03/10/17  1147     History   Chief Complaint Chief Complaint  Patient presents with  . Abscess   HPI   Blood pressure (!) 144/94, pulse 90, temperature 97.6 F (36.4 C), temperature source Oral, resp. rate 19, height 5\' 7"  (1.702 m), weight 83.9 kg (185 lb), SpO2 95 %.  Justin Fisher is a 31 y.o. male with past medical history significant foropiate abuse, benzodiazepine abuse, alcoholic complaining of abscess and cellulitis to right bicep area. He states he may have done an IM shot of heroin into the muscle over the last several weeks. He states that he is on methadone but he injects heroin roughly 6 times a week. He denies any fevers, chills, nausea, vomiting, back pain, cervical pain. He states that the area has been draining purulent material and he's been squeezing it. Patient has multiple lesions to the bilateral arms that do not follow venous paths, he states that he is a picker he is unsure when his last tetanus shot was. Patient denies skin popping.  Past Medical History:  Diagnosis Date  . Anxiety   . Suicidal thoughts     Patient Active Problem List   Diagnosis Date Noted  . 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 Opiate dependence (HCC) 03/01/2016  . Depressive disorder 02/29/2016  . 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 Alcohol use disorder, moderate, dependence (HCC) 09/11/2015    History reviewed. No pertinent surgical history.     Home Medications    Prior to Admission medications   Medication Sig Start Date End Date Taking? Authorizing Provider  clindamycin (CLEOCIN) 150 MG capsule Take 3 capsules (450 mg total) by mouth 3 (three) times daily. 03/10/17   Yezenia Fredrick, Joni Reining, PA-C  gabapentin (NEURONTIN) 400 MG capsule Take 1 capsule (400 mg  total) by mouth 3 (three) times daily. For agitation 06/17/16   Laveda Abbe, NP  hydrOXYzine (ATARAX/VISTARIL) 50 MG tablet Take 1 tablet (50 mg total) by mouth every 6 (six) hours as needed for anxiety. 06/17/16   Laveda Abbe, NP  lamoTRIgine (LAMICTAL) 25 MG tablet Take 1 tablet (25 mg total) by mouth daily. For mood stabilization 06/17/16   Laveda Abbe, NP  nicotine polacrilex (NICORETTE) 2 MG gum Take 1 each (2 mg total) by mouth as needed for smoking cessation. 06/17/16   Laveda Abbe, NP  sertraline (ZOLOFT) 100 MG tablet Take 2 tablets (200 mg total) by mouth daily. For depression 06/17/16   Laveda Abbe, NP  traZODone (DESYREL) 100 MG tablet Take 1 tablet (100 mg total) by mouth at bedtime as needed for sleep (may repeat x 1). For sleep 06/17/16   Laveda Abbe, NP    Family History No family history on file.  Social History Social History  Substance Use Topics  . Smoking status: Current Every Day Smoker    Packs/day: 0.50  . Smokeless tobacco: Never Used  . Alcohol use Yes     Allergies   Patient has no known allergies.   Review of Systems Review of Systems  A complete review of systems was obtained and all systems are negative except as noted in the HPI and PMH.    Physical Exam Updated Vital Signs BP 138/90 (BP Location: Left Arm)   Pulse 85   Temp 97.6 F (36.4  C) (Oral)   Resp 16   Ht 5\' 7"  (1.702 m)   Wt 83.9 kg (185 lb)   SpO2 97%   BMI 28.98 kg/m   Physical Exam  Constitutional: He is oriented to person, place, and time. He appears well-developed and well-nourished. No distress.  HENT:  Head: Normocephalic and atraumatic.  Mouth/Throat: Oropharynx is clear and moist.  Eyes: Pupils are equal, round, and reactive to light. Conjunctivae and EOM are normal.  Neck: Normal range of motion.  Cardiovascular: Normal rate, regular rhythm and intact distal pulses.   Pulmonary/Chest: Effort normal and  breath sounds normal.  Abdominal: Soft. There is no tenderness.  Musculoskeletal: Normal range of motion.  Neurological: He is alert and oriented to person, place, and time.  Skin: He is not diaphoretic.  Multiple lesions to bilateral upper extremities as photographed, patient has actively draining abscess and cellulitis to right upper extremity as photographed.  Psychiatric: He has a normal mood and affect.  Nursing note and vitals reviewed.      ED Treatments / Results  Labs (all labs ordered are listed, but only abnormal results are displayed) Labs Reviewed  CBC WITH DIFFERENTIAL/PLATELET - Abnormal; Notable for the following:       Result Value   WBC 11.9 (*)    Lymphs Abs 5.6 (*)    All other components within normal limits  CULTURE, BLOOD (ROUTINE X 2)  CULTURE, BLOOD (ROUTINE X 2)  BASIC METABOLIC PANEL  I-STAT CG4 LACTIC ACID, ED  I-STAT CG4 LACTIC ACID, ED    EKG  EKG Interpretation None       Radiology No results found.  Procedures .Marland KitchenIncision and Drainage Date/Time: 03/10/2017 6:45 PM Performed by: Wynetta Emery Authorized by: Wynetta Emery   Consent:    Consent obtained:  Verbal   Consent given by:  Patient Location:    Type:  Abscess   Location:  Upper extremity   Upper extremity location:  Arm   Arm location:  R upper arm Pre-procedure details:    Skin preparation:  Chloraprep Anesthesia (see MAR for exact dosages):    Anesthesia method:  Local infiltration   Local anesthetic:  Lidocaine 1% w/o epi Procedure type:    Complexity:  Complex Procedure details:    Incision types:  Single straight   Scalpel blade:  11   Wound management:  Probed and deloculated, irrigated with saline and extensive cleaning   Drainage:  Bloody and purulent   Drainage amount:  Copious   Wound treatment:  Wound left open   Packing materials:  1/4 in iodoform gauze Post-procedure details:    Patient tolerance of procedure:  Tolerated well, no immediate  complications   (including critical care time)  Medications Ordered in ED Medications  Tdap (BOOSTRIX) injection 0.5 mL (0.5 mLs Intramuscular Given 03/10/17 1655)  clindamycin (CLEOCIN) IVPB 600 mg (0 mg Intravenous Stopped 03/10/17 1811)  lidocaine (PF) (XYLOCAINE) 1 % injection 5 mL (5 mLs Intradermal Given 03/10/17 1655)  sodium chloride 0.9 % bolus 1,000 mL (0 mLs Intravenous Stopped 03/10/17 1835)     Initial Impression / Assessment and Plan / ED Course  I have reviewed the triage vital signs and the nursing notes.  Pertinent labs & imaging results that were available during my care of the patient were reviewed by me and considered in my medical decision making (see chart for details).     Vitals:   03/10/17 1159 03/10/17 1815  BP: (!) 144/94 138/90  Pulse: 90 85  Resp: 19 16  Temp: 97.6 F (36.4 C)   TempSrc: Oral   SpO2: 95% 97%  Weight: 83.9 kg (185 lb)   Height: 5\' 7"  (1.702 m)     Medications  Tdap (BOOSTRIX) injection 0.5 mL (0.5 mLs Intramuscular Given 03/10/17 1655)  clindamycin (CLEOCIN) IVPB 600 mg (0 mg Intravenous Stopped 03/10/17 1811)  lidocaine (PF) (XYLOCAINE) 1 % injection 5 mL (5 mLs Intradermal Given 03/10/17 1655)  sodium chloride 0.9 % bolus 1,000 mL (0 mLs Intravenous Stopped 03/10/17 1835)    Justin Fisher is 31 y.o. male presenting with abscess and cellulitis to upper extremity. Patient is IV drug user he states that he may have given himself an IM shot in the area that is affected. Patient is afebrile and nontoxic appearing, no systemic complaints. Blood work reassuring with normal lactic acid, very mild leukocytosis. I and D performed and wound is packed, patient encouraged to return to the ED in 48 hours for recheck.  Evaluation does not show pathology that would require ongoing emergent intervention or inpatient treatment. Pt is hemodynamically stable and mentating appropriately. Discussed findings and plan with patient/guardian, who agrees with care  plan. All questions answered. Return precautions discussed and outpatient follow up given.    Final Clinical Impressions(s) / ED Diagnoses   Final diagnoses:  Abscess  Cellulitis of right upper extremity  Heroin abuse    New Prescriptions New Prescriptions   CLINDAMYCIN (CLEOCIN) 150 MG CAPSULE    Take 3 capsules (450 mg total) by mouth 3 (three) times daily.     Kaylyn Limisciotta, Freya Zobrist, PA-C 03/10/17 1900    Vanetta MuldersZackowski, Scott, MD 03/11/17 [phone]

## 2017-03-10 NOTE — ED Triage Notes (Signed)
Pt in c/o R arm swelling, redness, & yellow drainage to abcess to R upper arm onset x 2-3 days, denies fever, pt reports picking at skin when anxious, pt has multiple brown areas on his skin, pt A&O x4

## 2017-03-15 LAB — CULTURE, BLOOD (ROUTINE X 2)
CULTURE: NO GROWTH
Culture: NO GROWTH
SPECIAL REQUESTS: ADEQUATE
SPECIAL REQUESTS: ADEQUATE

## 2017-06-04 ENCOUNTER — Emergency Department (HOSPITAL_COMMUNITY)
Admission: EM | Admit: 2017-06-04 | Discharge: 2017-06-04 | Disposition: A | Discharge status: Home / Self Care | Payer: BLUE CROSS/BLUE SHIELD | Attending: Emergency Medicine | Admitting: Emergency Medicine

## 2017-06-04 ENCOUNTER — Encounter (HOSPITAL_COMMUNITY): Payer: Self-pay | Admitting: *Deleted

## 2017-06-04 DIAGNOSIS — F17200 Nicotine dependence, unspecified, uncomplicated: Secondary | ICD-10-CM | POA: Insufficient documentation

## 2017-06-04 DIAGNOSIS — Z87898 Personal history of other specified conditions: Secondary | ICD-10-CM

## 2017-06-04 DIAGNOSIS — F1121 Opioid dependence, in remission: Secondary | ICD-10-CM | POA: Insufficient documentation

## 2017-06-04 DIAGNOSIS — F1191 Opioid use, unspecified, in remission: Secondary | ICD-10-CM

## 2017-06-04 DIAGNOSIS — Z79899 Other long term (current) drug therapy: Secondary | ICD-10-CM | POA: Insufficient documentation

## 2017-06-04 NOTE — ED Provider Notes (Signed)
[facility]   CSN: [id number] Arrival date & time: 06/04/17  1445     History   Chief Complaint Chief Complaint  Patient presents with  . Letter for School/Work    HPI Justin Fisher is a 31 y.o. male.  HPI   Justin Fisher is a 31yo male with a history of alcohol use disorder, heroin use disorder, benzodiazepine abuse, depression who presents to the Emergency Department stating "I need an EKG for my methadone clinic."  Patient states that [place], and was told that new procedures require each patient to get an EKG in order for him to continue getting his methadone.  He has no complaints.  Patient denies chest pain, shortness of breath, diaphoresis, nausea/vomiting.  Past Medical History:  Diagnosis Date  . Anxiety   . Suicidal thoughts     Patient Active Problem List   Diagnosis Date Noted  . 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  . Opiate dependence (HCC) 03/01/2016  . Depressive disorder 02/29/2016  . 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 Alcohol use disorder, moderate, dependence (HCC) 09/11/2015    History reviewed. No pertinent surgical history.     Home Medications    Prior to Admission medications   Medication Sig Start Date End Date Taking? Authorizing Provider  clindamycin (CLEOCIN) 150 MG capsule Take 3 capsules (450 mg total) by mouth 3 (three) times daily. 03/10/17   Pisciotta, Joni ReiningNicole, PA-C  gabapentin (NEURONTIN) 400 MG capsule Take 1 capsule (400 mg total) by mouth 3 (three) times daily. For agitation 06/17/16   Laveda AbbeParks, Laurie Britton, NP  hydrOXYzine (ATARAX/VISTARIL) 50 MG tablet Take 1 tablet (50 mg total) by mouth every 6 (six) hours as needed for anxiety. 06/17/16   Laveda AbbeParks, Laurie Britton, NP  lamoTRIgine (LAMICTAL)  25 MG tablet Take 1 tablet (25 mg total) by mouth daily. For mood stabilization 06/17/16   Laveda AbbeParks, Laurie Britton, NP  nicotine polacrilex (NICORETTE) 2 MG gum Take 1 each (2 mg total) by mouth as needed for smoking cessation. 06/17/16   Laveda AbbeParks, Laurie Britton, NP  sertraline (ZOLOFT) 100 MG tablet Take 2 tablets (200 mg total) by mouth daily. For depression 06/17/16   Laveda AbbeParks, Laurie Britton, NP  traZODone (DESYREL) 100 MG tablet Take 1 tablet (100 mg total) by mouth at bedtime as needed for sleep (may repeat x 1). For sleep 06/17/16   Laveda AbbeParks, Laurie Britton, NP    Family History History reviewed. No pertinent family history.  Social History Social History   Tobacco Use  . Smoking status: Current Every Day Smoker    Packs/day: 0.50  . Smokeless tobacco: Never Used  Substance Use Topics  . Alcohol use: Yes  . Drug use: Yes    Types: IV    Comment: Uses heroin daily as well as xanax, last use 2-3 mths     Allergies   Patient has no known allergies.   Review of Systems Review of Systems  Constitutional: Negative for diaphoresis.  Respiratory: Negative for shortness of breath.   Cardiovascular: Negative for chest pain and palpitations.  Gastrointestinal: Negative for abdominal pain, nausea and vomiting.  Musculoskeletal: Negative for arthralgias.  Neurological: Negative for weakness and numbness.     Physical Exam Updated Vital Signs BP 121/89 (BP Location: Right Arm)   Pulse 90   Temp 98.6 F (37  C) (Oral)   Resp 18   SpO2 98%   Physical Exam  Constitutional: He appears well-developed and well-nourished. No distress.  HENT:  Head: Normocephalic and atraumatic.  Mouth/Throat: Oropharynx is clear and moist. No oropharyngeal exudate.  Eyes: Conjunctivae are normal. Pupils are equal, round, and reactive to light. Right eye exhibits no discharge. Left eye exhibits no discharge.  Cardiovascular: Normal rate, regular rhythm and intact distal pulses. Exam reveals no friction  rub.  No murmur heard. Pulmonary/Chest: Effort normal and breath sounds normal. No stridor. No respiratory distress. He has no wheezes. He has no rales.  Neurological: He is alert. Coordination normal.  Skin: He is not diaphoretic.  Psychiatric: He has a normal mood and affect. His behavior is normal.  Nursing note and vitals reviewed.    ED Treatments / Results  Labs (all labs ordered are listed, but only abnormal results are displayed) Labs Reviewed - No data to display  EKG  EKG Interpretation None       Radiology No results found.  Procedures Procedures (including critical care time)  Medications Ordered in ED Medications - No data to display   Initial Impression / Assessment and Plan / ED Course  I have reviewed the triage vital signs and the nursing notes.  Pertinent labs & imaging results that were available during my care of the patient were reviewed by me and considered in my medical decision making (see chart for details).    Patient presents needing an EKG for his methadone clinic as a general protocol measure.  He has no complaints.  EKG ordered, normal sinus rhythm, nonischemic.  Vital signs stable.  Final Clinical Impressions(s) / ED Diagnoses   Final diagnoses:  History of methadone use [facility](HCC)    ED Discharge Orders    None       Kellie ShropshireShrosbree, Emily J, PA-C 06/04/17 1752    Mancel BaleWentz, Elliott, MD 06/07/17 [phone]

## 2017-06-04 NOTE — Discharge Instructions (Signed)
Your EKG did not show evidence of heart attack.  Please return to the emergency department for any new or worsening symptoms.

## 2017-06-04 NOTE — ED Triage Notes (Signed)
Pt reports being sent by methadone clinic. Having change in dosage and was told that he needs an EKG but pt does not have pcp.  No other complaints. No distress noted at triage.

## 2017-06-04 NOTE — ED Notes (Signed)
Patient called to room, no answer x2.  

## 2018-07-02 ENCOUNTER — Encounter (HOSPITAL_COMMUNITY): Payer: Self-pay

## 2018-07-02 ENCOUNTER — Emergency Department (HOSPITAL_COMMUNITY)
Admission: EM | Admit: 2018-07-02 | Discharge: 2018-07-02 | Disposition: A | Discharge status: Home / Self Care | Payer: BLUE CROSS/BLUE SHIELD | Attending: Emergency Medicine | Admitting: Emergency Medicine

## 2018-07-02 DIAGNOSIS — Z202 Contact with and (suspected) exposure to infections with a predominantly sexual mode of transmission: Secondary | ICD-10-CM | POA: Insufficient documentation

## 2018-07-02 DIAGNOSIS — R9431 Abnormal electrocardiogram [ECG] [EKG]: Secondary | ICD-10-CM | POA: Insufficient documentation

## 2018-07-02 DIAGNOSIS — F17210 Nicotine dependence, cigarettes, uncomplicated: Secondary | ICD-10-CM | POA: Insufficient documentation

## 2018-07-02 NOTE — ED Provider Notes (Signed)
Emergency Department Provider Note   I have reviewed the triage vital signs and the nursing notes.   HISTORY  Chief Complaint SEXUALLY TRANSMITTED DISEASE and Abnormal ECG   HPI Justin Fisher is a 32 y.o. male with PMH of opiate dependence on methadone since to the emergency department for evaluation of routine EKG.  He is currently followed in a methadone clinic and required to get screening EKG periodically.  He states his last on may have been abnormal.  Is not having any chest pain, shortness of breath, palpitation symptoms.  Patient also concerned about possible STD exposure.  He had unprotected sex in September.  He had a brief period of urethral discharge and discomfort but that is resolved.  He is unsure of his last HIV testing.    Past Medical History:  Diagnosis Date  . Anxiety   . Suicidal thoughts     Patient Active Problem List   Diagnosis Date Noted  . 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  . Opiate dependence (HCC) 03/01/2016  . Depressive disorder 02/29/2016  . 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 Alcohol use disorder, moderate, dependence (HCC) 09/11/2015    History reviewed. No pertinent surgical history.  Allergies Patient has no known allergies.  No family history on file.  Social History Social History   Tobacco Use  . Smoking status: Current Every Day Smoker    Packs/day: 0.50  . Smokeless tobacco: Never Used  Substance Use Topics  . Alcohol use: Yes  . Drug use: Yes    Types: IV    Comment: Uses heroin daily as well as xanax, last use 2-3 mths    Review of Systems  Constitutional: No fever/chills Eyes: No visual changes. ENT: No sore throat. Cardiovascular: Denies chest pain. Respiratory: Denies shortness of breath. Gastrointestinal: No abdominal pain. No nausea, no vomiting. No  diarrhea. No constipation. Genitourinary: Negative for dysuria. Urethral discharge.  Musculoskeletal: Negative for back pain. Skin: Negative for rash. Neurological: Negative for headaches, focal weakness or numbness.  10-point ROS otherwise negative.  ____________________________________________   PHYSICAL EXAM:  VITAL SIGNS: ED Triage Vitals  Enc Vitals Group     BP 07/02/18 0816 124/84     Pulse Rate 07/02/18 0816 96     Resp 07/02/18 0816 18     Temp 07/02/18 0816 97.7 F (36.5 C)     Temp Source 07/02/18 0816 Oral     SpO2 07/02/18 0816 96 %     Pain Score 07/02/18 0812 0   Constitutional: Alert and oriented. Well appearing and in no acute distress. Eyes: Conjunctivae are normal.  Head: Atraumatic. Nose: No congestion/rhinnorhea. Mouth/Throat: Mucous membranes are moist. Neck: No stridor.  Cardiovascular: Normal rate, regular rhythm. Good peripheral circulation. Grossly normal heart sounds.   Respiratory: Normal respiratory effort.  No retractions. Lungs CTAB. Gastrointestinal: Soft and nontender. No distention.  Genitourinary: No urethral discharge on exam. Normal external genitalia.  Musculoskeletal: No lower extremity tenderness nor edema. No gross deformities of extremities. Neurologic:  Normal speech and language. No gross focal neurologic deficits are appreciated.  Skin:  Skin is warm, dry and intact. No rash noted.  ____________________________________________   LABS (all labs ordered are listed, but only abnormal results are displayed)  Labs Reviewed  HIV ANTIBODY (ROUTINE TESTING W REFLEX)  RPR  GC/CHLAMYDIA PROBE AMP ([place]) NOT AT [facility]   ____________________________________________  EKG  EKG Interpretation  Date/Time:  Saturday July 02 2018 08:33:53 EST Ventricular Rate:  88 PR Interval:    QRS Duration: 91 QT Interval:  367 QTC Calculation: 444 R Axis:   33 Text Interpretation:  Sinus rhythm Probable left atrial enlargement Low  voltage, precordial leads No STEMI.  Confirmed by Alona BeneLong, Josede Cicero [phone]([id number]) on 07/02/2018 9:23:38 AM       ____________________________________________   PROCEDURES  Procedure(s) performed:   Procedures  None ____________________________________________   INITIAL IMPRESSION / ASSESSMENT AND PLAN / ED COURSE  Pertinent labs & imaging results that were available during my care of the patient were reviewed by me and considered in my medical decision making (see chart for details).  Patient presents to the emergency department for evaluation of routine EKG and urethral discharge which has resolved.  Patient was swabbed for gonorrhea/chlamydia testing.  Plan for also HIV and RPR testing.  Will obtain screening EKG but patient with no cardiac symptoms.  EKG unremarkable. Plan for discharge. Copy of EKG provided at discharge for outside clinic.  ____________________________________________  FINAL CLINICAL IMPRESSION(S) / ED DIAGNOSES  Final diagnoses:  Abnormal EKG  STD exposure    Note:  This document was prepared using Dragon voice recognition software and may include unintentional dictation errors.  Alona BeneJoshua Maurion Walkowiak, MD Emergency Medicine    Ah Bott, Arlyss RepressJoshua G, MD 07/02/18 [phone]

## 2018-07-02 NOTE — ED Triage Notes (Signed)
Pt [facility], on meth[facility]one. Pt also reports he had some penile discharge a few months ago and would like to be tested for STD. No current symptoms.

## 2018-07-02 NOTE — Discharge Instructions (Signed)
Your EKG today was normal. You will be called if your STD testing is positive for infection. Call the PCP listed to establish care for future health maintenance concerns. Return to the ED with any new or worsening symptoms.

## 2018-07-03 LAB — RPR: RPR Ser Ql: NONREACTIVE

## 2018-07-03 LAB — HIV ANTIBODY (ROUTINE TESTING W REFLEX): HIV Screen 4th Generation wRfx: NONREACTIVE

## 2018-07-04 LAB — GC/CHLAMYDIA PROBE AMP (~~LOC~~) NOT AT ARMC
Chlamydia: NEGATIVE
Neisseria Gonorrhea: NEGATIVE

## 2019-12-11 ENCOUNTER — Telehealth (HOSPITAL_COMMUNITY): Payer: BLUE CROSS/BLUE SHIELD | Admitting: Psychiatry

## 2020-01-18 ENCOUNTER — Telehealth (HOSPITAL_COMMUNITY): Payer: BLUE CROSS/BLUE SHIELD | Admitting: Psychiatric/Mental Health

## 2020-01-18 ENCOUNTER — Other Ambulatory Visit: Payer: Self-pay

## 2020-04-15 ENCOUNTER — Encounter (HOSPITAL_COMMUNITY): Payer: Self-pay | Admitting: Emergency Medicine

## 2020-04-15 ENCOUNTER — Emergency Department (HOSPITAL_COMMUNITY): Payer: BLUE CROSS/BLUE SHIELD

## 2020-04-15 ENCOUNTER — Other Ambulatory Visit: Payer: Self-pay

## 2020-04-15 ENCOUNTER — Emergency Department (HOSPITAL_COMMUNITY)
Admission: EM | Admit: 2020-04-15 | Discharge: 2020-04-16 | Disposition: A | Discharge status: Home / Self Care | Payer: BLUE CROSS/BLUE SHIELD | Attending: Emergency Medicine | Admitting: Emergency Medicine

## 2020-04-15 DIAGNOSIS — Z5321 Procedure and treatment not carried out due to patient leaving prior to being seen by health care provider: Secondary | ICD-10-CM | POA: Diagnosis not present

## 2020-04-15 DIAGNOSIS — M79602 Pain in left arm: Secondary | ICD-10-CM | POA: Diagnosis present

## 2020-04-15 DIAGNOSIS — T1490XA Injury, unspecified, initial encounter: Secondary | ICD-10-CM

## 2020-04-15 NOTE — ED Triage Notes (Signed)
Pt presents to ED POV. Pt c/o L arm pain pt reports that he was injecting heroin and the end of the needle broke into arm. Area is red and raised, bleeding controlled.

## 2020-04-15 NOTE — ED Notes (Signed)
Step outside 

## 2020-04-16 NOTE — ED Notes (Addendum)
Pt called 4x times no answer Pt said he would be back has not returned

## 2021-05-22 ENCOUNTER — Ambulatory Visit (INDEPENDENT_AMBULATORY_CARE_PROVIDER_SITE_OTHER): Payer: BC Managed Care – PPO

## 2021-05-22 ENCOUNTER — Other Ambulatory Visit: Payer: Self-pay

## 2021-05-22 ENCOUNTER — Encounter (HOSPITAL_COMMUNITY): Payer: Self-pay | Admitting: Emergency Medicine

## 2021-05-22 ENCOUNTER — Ambulatory Visit (HOSPITAL_COMMUNITY)
Admission: EM | Admit: 2021-05-22 | Discharge: 2021-05-22 | Disposition: A | Discharge status: Home / Self Care | Payer: BC Managed Care – PPO | Attending: Internal Medicine | Admitting: Internal Medicine

## 2021-05-22 DIAGNOSIS — M25552 Pain in left hip: Secondary | ICD-10-CM

## 2021-05-22 DIAGNOSIS — M25559 Pain in unspecified hip: Secondary | ICD-10-CM

## 2021-05-22 DIAGNOSIS — M7062 Trochanteric bursitis, left hip: Secondary | ICD-10-CM

## 2021-05-22 MED ORDER — DEXAMETHASONE SODIUM PHOSPHATE 10 MG/ML IJ SOLN
INTRAMUSCULAR | Status: AC
  Filled 2021-05-22: qty 1

## 2021-05-22 MED ORDER — MELOXICAM 7.5 MG PO TABS: 7.5000 mg | ORAL_TABLET | Freq: Every day | ORAL | 0 refills | Status: DC

## 2021-05-22 MED ORDER — DEXAMETHASONE SODIUM PHOSPHATE 10 MG/ML IJ SOLN
10.0000 mg | Freq: Once | INTRAMUSCULAR | Status: AC
  Administered 2021-05-22: 15:00:00 10 mg via INTRAMUSCULAR
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
## 2021-05-22 NOTE — Discharge Instructions (Addendum)
Take medications as prescribed Gentle range of motion Continue taking Tylenol arthritis as needed for pain.

## 2021-05-22 NOTE — ED Triage Notes (Signed)
Pt reports left hip pain for 3 weeks making hard to walk due to not wanting to bear wait. Pt also c/o right shoulder pains x 3 days.  Pt had called EMS 3 weeks ago about hip pains and was told by them that arthritis and taking OTC medications that arent helping. Pt now using a walker for gait assistance. Deneis any falls or injuries.

## 2021-07-07 ENCOUNTER — Other Ambulatory Visit: Payer: Self-pay

## 2021-07-07 ENCOUNTER — Emergency Department (HOSPITAL_COMMUNITY)
Admission: EM | Admit: 2021-07-07 | Discharge: 2021-07-07 | Disposition: A | Discharge status: Home / Self Care | Payer: BC Managed Care – PPO | Source: Home / Self Care

## 2021-07-07 ENCOUNTER — Encounter (HOSPITAL_COMMUNITY): Payer: Self-pay

## 2021-07-07 ENCOUNTER — Emergency Department (HOSPITAL_COMMUNITY): Payer: BC Managed Care – PPO

## 2021-07-07 DIAGNOSIS — A408 Other streptococcal sepsis: Secondary | ICD-10-CM | POA: Diagnosis not present

## 2021-07-07 DIAGNOSIS — L089 Local infection of the skin and subcutaneous tissue, unspecified: Secondary | ICD-10-CM | POA: Insufficient documentation

## 2021-07-07 DIAGNOSIS — R2231 Localized swelling, mass and lump, right upper limb: Secondary | ICD-10-CM | POA: Insufficient documentation

## 2021-07-07 DIAGNOSIS — Z5321 Procedure and treatment not carried out due to patient leaving prior to being seen by health care provider: Secondary | ICD-10-CM | POA: Insufficient documentation

## 2021-07-07 DIAGNOSIS — L02413 Cutaneous abscess of right upper limb: Secondary | ICD-10-CM | POA: Diagnosis not present

## 2021-07-07 NOTE — ED Triage Notes (Signed)
Pt BIB EMS from home. Pt has skin infection to right shoulder from IV drug use. Pt has swelling to right arm.   HR 105

## 2021-07-07 NOTE — ED Notes (Signed)
Pt did not want me to get his blood work

## 2021-07-07 NOTE — ED Provider Notes (Signed)
Pt eloped after he had his xray before getting remainder of blood work done or receiving results of his imagine. Xray of humerus shows gas in soft tissue. I attempted to call patient at the number in his chart to request him to come back to ED, but he did not answer and his voicemail was full.    Delight Ovens 07/07/21 2147    Tegeler, Canary Brim, MD 07/07/21 2225

## 2021-07-07 NOTE — ED Provider Notes (Signed)
Emergency Medicine Provider Triage Evaluation Note  Justin Fisher , a 36 y.o. male  was evaluated in triage.  Pt complains of right shoulder infection, likely from IVDU. Pt most recently used cocaine a couple of days ago but does admit to IVDU and is concerned that is from that. He first noticed the infection a few days ago and it has been worsening, spreading down his arm and up into shoulder. He has not taken his temperature but has felt feverish.   Review of Systems  Positive: Right shoulder pain, wound with purulent discharge Negative: Abdominal pain, n/v/d, chest pai, cough   Physical Exam  BP 123/63    Pulse (!) 105    Temp 97.7 F (36.5 C) (Oral)    Resp 18    SpO2 100%  Gen:   Awake, no distress   Resp:  Normal effort  MSK:   Moves extremities without difficulty  Other:  Right shoulder erythematous radiating down to right upper arm with few streaks into forearm. Numerous what appears to be infected puncture marks in the right upper arm, with purulent discharge.   Medical Decision Making  Medically screening exam initiated at 2:38 PM.  Appropriate orders placed.  Eldra Word was informed that the remainder of the evaluation will be completed by another provider, this initial triage assessment does not replace that evaluation, and the importance of remaining in the ED until their evaluation is complete.  Suspect sepsis, appropriate labs ordered   Delight Ovens 07/07/21 1447    Tegeler, Canary Brim, MD 07/07/21 [phone]

## 2021-07-09 ENCOUNTER — Emergency Department (HOSPITAL_COMMUNITY): Payer: BC Managed Care – PPO

## 2021-07-09 ENCOUNTER — Inpatient Hospital Stay (HOSPITAL_COMMUNITY)
Admission: EM | Admit: 2021-07-09 | Discharge: 2021-08-05 | DRG: 853 | Disposition: A | Discharge status: Home / Self Care | Payer: BC Managed Care – PPO | Attending: Internal Medicine | Admitting: Internal Medicine

## 2021-07-09 ENCOUNTER — Inpatient Hospital Stay (HOSPITAL_COMMUNITY): Payer: BC Managed Care – PPO

## 2021-07-09 DIAGNOSIS — F1120 Opioid dependence, uncomplicated: Secondary | ICD-10-CM | POA: Diagnosis present

## 2021-07-09 DIAGNOSIS — B182 Chronic viral hepatitis C: Secondary | ICD-10-CM | POA: Diagnosis present

## 2021-07-09 DIAGNOSIS — R9389 Abnormal findings on diagnostic imaging of other specified body structures: Secondary | ICD-10-CM | POA: Diagnosis not present

## 2021-07-09 DIAGNOSIS — D509 Iron deficiency anemia, unspecified: Secondary | ICD-10-CM | POA: Diagnosis present

## 2021-07-09 DIAGNOSIS — D649 Anemia, unspecified: Secondary | ICD-10-CM | POA: Diagnosis present

## 2021-07-09 DIAGNOSIS — Z79899 Other long term (current) drug therapy: Secondary | ICD-10-CM

## 2021-07-09 DIAGNOSIS — F39 Unspecified mood [affective] disorder: Secondary | ICD-10-CM | POA: Diagnosis not present

## 2021-07-09 DIAGNOSIS — Z6824 Body mass index (BMI) 24.0-24.9, adult: Secondary | ICD-10-CM

## 2021-07-09 DIAGNOSIS — L03113 Cellulitis of right upper limb: Secondary | ICD-10-CM | POA: Diagnosis present

## 2021-07-09 DIAGNOSIS — D6959 Other secondary thrombocytopenia: Secondary | ICD-10-CM | POA: Diagnosis not present

## 2021-07-09 DIAGNOSIS — T148XXA Other injury of unspecified body region, initial encounter: Secondary | ICD-10-CM

## 2021-07-09 DIAGNOSIS — M726 Necrotizing fasciitis: Secondary | ICD-10-CM | POA: Diagnosis present

## 2021-07-09 DIAGNOSIS — Z720 Tobacco use: Secondary | ICD-10-CM | POA: Diagnosis present

## 2021-07-09 DIAGNOSIS — S41101A Unspecified open wound of right upper arm, initial encounter: Secondary | ICD-10-CM | POA: Diagnosis not present

## 2021-07-09 DIAGNOSIS — R6520 Severe sepsis without septic shock: Secondary | ICD-10-CM | POA: Diagnosis not present

## 2021-07-09 DIAGNOSIS — Z792 Long term (current) use of antibiotics: Secondary | ICD-10-CM | POA: Diagnosis not present

## 2021-07-09 DIAGNOSIS — E785 Hyperlipidemia, unspecified: Secondary | ICD-10-CM | POA: Diagnosis present

## 2021-07-09 DIAGNOSIS — A408 Other streptococcal sepsis: Principal | ICD-10-CM | POA: Diagnosis present

## 2021-07-09 DIAGNOSIS — F1410 Cocaine abuse, uncomplicated: Secondary | ICD-10-CM | POA: Diagnosis present

## 2021-07-09 DIAGNOSIS — A419 Sepsis, unspecified organism: Secondary | ICD-10-CM | POA: Diagnosis not present

## 2021-07-09 DIAGNOSIS — E440 Moderate protein-calorie malnutrition: Secondary | ICD-10-CM | POA: Insufficient documentation

## 2021-07-09 DIAGNOSIS — F1990 Other psychoactive substance use, unspecified, uncomplicated: Secondary | ICD-10-CM | POA: Diagnosis not present

## 2021-07-09 DIAGNOSIS — F419 Anxiety disorder, unspecified: Secondary | ICD-10-CM | POA: Diagnosis present

## 2021-07-09 DIAGNOSIS — F17210 Nicotine dependence, cigarettes, uncomplicated: Secondary | ICD-10-CM | POA: Diagnosis present

## 2021-07-09 DIAGNOSIS — E876 Hypokalemia: Secondary | ICD-10-CM | POA: Diagnosis present

## 2021-07-09 DIAGNOSIS — Z20822 Contact with and (suspected) exposure to covid-19: Secondary | ICD-10-CM | POA: Diagnosis present

## 2021-07-09 DIAGNOSIS — I10 Essential (primary) hypertension: Secondary | ICD-10-CM | POA: Diagnosis not present

## 2021-07-09 DIAGNOSIS — R7401 Elevation of levels of liver transaminase levels: Secondary | ICD-10-CM | POA: Diagnosis not present

## 2021-07-09 DIAGNOSIS — R011 Cardiac murmur, unspecified: Secondary | ICD-10-CM | POA: Diagnosis not present

## 2021-07-09 DIAGNOSIS — E43 Unspecified severe protein-calorie malnutrition: Secondary | ICD-10-CM | POA: Diagnosis not present

## 2021-07-09 DIAGNOSIS — L02413 Cutaneous abscess of right upper limb: Secondary | ICD-10-CM | POA: Diagnosis present

## 2021-07-09 DIAGNOSIS — F32A Depression, unspecified: Secondary | ICD-10-CM | POA: Diagnosis present

## 2021-07-09 DIAGNOSIS — M86121 Other acute osteomyelitis, right humerus: Secondary | ICD-10-CM | POA: Diagnosis present

## 2021-07-09 DIAGNOSIS — D62 Acute posthemorrhagic anemia: Secondary | ICD-10-CM | POA: Diagnosis not present

## 2021-07-09 DIAGNOSIS — A021 Salmonella sepsis: Secondary | ICD-10-CM | POA: Diagnosis not present

## 2021-07-09 DIAGNOSIS — E871 Hypo-osmolality and hyponatremia: Secondary | ICD-10-CM | POA: Diagnosis present

## 2021-07-09 DIAGNOSIS — L0390 Cellulitis, unspecified: Secondary | ICD-10-CM | POA: Diagnosis present

## 2021-07-09 DIAGNOSIS — Z791 Long term (current) use of non-steroidal anti-inflammatories (NSAID): Secondary | ICD-10-CM | POA: Diagnosis not present

## 2021-07-09 HISTORY — DX: Depression, unspecified: F32.A

## 2021-07-09 LAB — CBC WITH DIFFERENTIAL/PLATELET
Abs Immature Granulocytes: 0 10*3/uL (ref 0.00–0.07)
Basophils Absolute: 0 10*3/uL (ref 0.0–0.1)
Basophils Relative: 0 %
Eosinophils Absolute: 0 10*3/uL (ref 0.0–0.5)
Eosinophils Relative: 0 %
HCT: 24.2 % — ABNORMAL LOW (ref 39.0–52.0)
Hemoglobin: 7.5 g/dL — ABNORMAL LOW (ref 13.0–17.0)
Lymphocytes Relative: 9 %
Lymphs Abs: 1.9 10*3/uL (ref 0.7–4.0)
MCH: 21.9 pg — ABNORMAL LOW (ref 26.0–34.0)
MCHC: 31 g/dL (ref 30.0–36.0)
MCV: 70.8 fL — ABNORMAL LOW (ref 80.0–100.0)
Monocytes Absolute: 0.8 10*3/uL (ref 0.1–1.0)
Monocytes Relative: 4 %
Neutro Abs: 18.4 10*3/uL — ABNORMAL HIGH (ref 1.7–7.7)
Neutrophils Relative %: 87 %
Platelets: 600 10*3/uL — ABNORMAL HIGH (ref 150–400)
RBC: 3.42 MIL/uL — ABNORMAL LOW (ref 4.22–5.81)
RDW: 17.6 % — ABNORMAL HIGH (ref 11.5–15.5)
WBC: 21.1 10*3/uL — ABNORMAL HIGH (ref 4.0–10.5)
nRBC: 0 % (ref 0.0–0.2)
nRBC: 0 /100 WBC

## 2021-07-09 LAB — HEPATIC FUNCTION PANEL
ALT: 16 U/L (ref 0–44)
AST: 20 U/L (ref 15–41)
Albumin: 1.9 g/dL — ABNORMAL LOW (ref 3.5–5.0)
Alkaline Phosphatase: 91 U/L (ref 38–126)
Bilirubin, Direct: 0.2 mg/dL (ref 0.0–0.2)
Indirect Bilirubin: 0.6 mg/dL (ref 0.3–0.9)
Total Bilirubin: 0.8 mg/dL (ref 0.3–1.2)
Total Protein: 6.6 g/dL (ref 6.5–8.1)

## 2021-07-09 LAB — MAGNESIUM: Magnesium: 1.9 mg/dL (ref 1.7–2.4)

## 2021-07-09 LAB — COMPREHENSIVE METABOLIC PANEL
ALT: 18 U/L (ref 0–44)
AST: 21 U/L (ref 15–41)
Albumin: 2 g/dL — ABNORMAL LOW (ref 3.5–5.0)
Alkaline Phosphatase: 81 U/L (ref 38–126)
Anion gap: 10 (ref 5–15)
BUN: 16 mg/dL (ref 6–20)
CO2: 21 mmol/L — ABNORMAL LOW (ref 22–32)
Calcium: 8.2 mg/dL — ABNORMAL LOW (ref 8.9–10.3)
Chloride: 100 mmol/L (ref 98–111)
Creatinine, Ser: 0.64 mg/dL (ref 0.61–1.24)
GFR, Estimated: >60 mL/min (ref >60)
Glucose, Bld: 114 mg/dL — ABNORMAL HIGH (ref 70–99)
Potassium: 2.4 mmol/L — CL (ref 3.5–5.1)
Sodium: 131 mmol/L — ABNORMAL LOW (ref 135–145)
Total Bilirubin: 0.6 mg/dL (ref 0.3–1.2)
Total Protein: 6.4 g/dL — ABNORMAL LOW (ref 6.5–8.1)

## 2021-07-09 LAB — LACTIC ACID, PLASMA: Lactic Acid, Venous: 1.2 mmol/L (ref 0.5–1.9)

## 2021-07-09 LAB — SODIUM, URINE, RANDOM: Sodium, Ur: <10 mmol/L

## 2021-07-09 LAB — RAPID URINE DRUG SCREEN, HOSP PERFORMED
Amphetamines: NOT DETECTED
Barbiturates: NOT DETECTED
Benzodiazepines: NOT DETECTED
Cocaine: POSITIVE — AB
Opiates: NOT DETECTED
Tetrahydrocannabinol: NOT DETECTED

## 2021-07-09 LAB — PROCALCITONIN: Procalcitonin: 1.06 ng/mL

## 2021-07-09 LAB — ETHANOL: Alcohol, Ethyl (B): <10 mg/dL (ref <10)

## 2021-07-09 LAB — TSH: TSH: 0.527 u[IU]/mL (ref 0.350–4.500)

## 2021-07-09 LAB — PHOSPHORUS: Phosphorus: 4.6 mg/dL (ref 2.5–4.6)

## 2021-07-09 LAB — RESP PANEL BY RT-PCR (FLU A&B, COVID) ARPGX2
Influenza A by PCR: NEGATIVE
Influenza B by PCR: NEGATIVE
SARS Coronavirus 2 by RT PCR: NEGATIVE

## 2021-07-09 LAB — CK: Total CK: 15 U/L — ABNORMAL LOW (ref 49–397)

## 2021-07-09 LAB — RETICULOCYTES
Immature Retic Fract: 24.1 % — ABNORMAL HIGH (ref 2.3–15.9)
RBC.: 3.43 MIL/uL — ABNORMAL LOW (ref 4.22–5.81)
Retic Count, Absolute: 15.8 10*3/uL — ABNORMAL LOW (ref 19.0–186.0)
Retic Ct Pct: 0.5 % (ref 0.4–3.1)

## 2021-07-09 LAB — CREATININE, URINE, RANDOM: Creatinine, Urine: 41.01 mg/dL

## 2021-07-09 LAB — FOLATE: Folate: 10.8 ng/mL (ref >5.9)

## 2021-07-09 LAB — OSMOLALITY: Osmolality: 291 mOsm/kg (ref 275–295)

## 2021-07-09 MED ORDER — OXYCODONE-ACETAMINOPHEN 5-325 MG PO TABS: 2.0000 | ORAL_TABLET | Freq: Once | ORAL | Status: DC

## 2021-07-09 MED ORDER — VANCOMYCIN HCL 1250 MG/250ML IV SOLN
1250.0000 mg | Freq: Two times a day (BID) | INTRAVENOUS | Status: DC
  Administered 2021-07-10 – 2021-07-12 (×4): 1250 mg via INTRAVENOUS
  Filled 2021-07-09 (×7): qty 250

## 2021-07-09 MED ORDER — VANCOMYCIN HCL 1500 MG/300ML IV SOLN
1500.0000 mg | Freq: Once | INTRAVENOUS | Status: AC
  Administered 2021-07-09: 1500 mg via INTRAVENOUS
  Filled 2021-07-09: qty 300

## 2021-07-09 MED ORDER — SODIUM CHLORIDE 0.9 % IV SOLN: 75.0000 mL/h | INTRAVENOUS | Status: AC

## 2021-07-09 MED ORDER — PIPERACILLIN-TAZOBACTAM 3.375 G IVPB
3.3750 g | Freq: Three times a day (TID) | INTRAVENOUS | Status: DC
  Administered 2021-07-09: 3.375 g via INTRAVENOUS
  Filled 2021-07-09: qty 50

## 2021-07-09 MED ORDER — POTASSIUM CHLORIDE CRYS ER 20 MEQ PO TBCR
40.0000 meq | EXTENDED_RELEASE_TABLET | Freq: Once | ORAL | Status: AC
  Administered 2021-07-09: 40 meq via ORAL
  Filled 2021-07-09: qty 2

## 2021-07-09 MED ORDER — OXYCODONE-ACETAMINOPHEN 5-325 MG PO TABS: 1.0000 | ORAL_TABLET | Freq: Once | ORAL | Status: DC

## 2021-07-09 MED ORDER — ONDANSETRON HCL 4 MG/2ML IJ SOLN
4.0000 mg | Freq: Once | INTRAMUSCULAR | Status: AC
  Administered 2021-07-09: 4 mg via INTRAVENOUS
  Filled 2021-07-09: qty 2

## 2021-07-09 MED ORDER — ACETAMINOPHEN 650 MG RE SUPP: 650.0000 mg | Freq: Four times a day (QID) | RECTAL | Status: DC | PRN

## 2021-07-09 MED ORDER — HYDROMORPHONE HCL 1 MG/ML IJ SOLN
1.0000 mg | INTRAMUSCULAR | Status: AC | PRN
  Administered 2021-07-09 – 2021-07-10 (×3): 1 mg via INTRAVENOUS
  Filled 2021-07-09 (×3): qty 1

## 2021-07-09 MED ORDER — VANCOMYCIN HCL IN DEXTROSE 1-5 GM/200ML-% IV SOLN
1000.0000 mg | Freq: Once | INTRAVENOUS | Status: DC
  Filled 2021-07-09: qty 200

## 2021-07-09 MED ORDER — POTASSIUM CHLORIDE 10 MEQ/100ML IV SOLN
10.0000 meq | INTRAVENOUS | Status: AC
  Administered 2021-07-09 – 2021-07-10 (×2): 10 meq via INTRAVENOUS
  Filled 2021-07-09 (×2): qty 100

## 2021-07-09 MED ORDER — ACETAMINOPHEN 325 MG PO TABS
650.0000 mg | ORAL_TABLET | Freq: Four times a day (QID) | ORAL | Status: DC | PRN
  Administered 2021-07-11 – 2021-07-31 (×8): 650 mg via ORAL
  Filled 2021-07-09 (×10): qty 2

## 2021-07-09 MED ORDER — OXYCODONE-ACETAMINOPHEN 5-325 MG PO TABS
2.0000 | ORAL_TABLET | Freq: Once | ORAL | Status: AC
  Administered 2021-07-09: 2 via ORAL
  Filled 2021-07-09: qty 2

## 2021-07-09 MED ORDER — ALBUTEROL SULFATE (2.5 MG/3ML) 0.083% IN NEBU: 2.5000 mg | INHALATION_SOLUTION | RESPIRATORY_TRACT | Status: DC | PRN

## 2021-07-09 MED ORDER — HYDROMORPHONE HCL 1 MG/ML IJ SOLN: 1.0000 mg | Freq: Once | INTRAMUSCULAR | Status: DC

## 2021-07-09 NOTE — ED Notes (Signed)
Patient transported to CT 
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
## 2021-07-09 NOTE — Assessment & Plan Note (Addendum)
counseled re stopping Cocaine abuse Transition care const

## 2021-07-09 NOTE — Progress Notes (Signed)
Patient discussed with ED provider.  History of IV drug abuse with clinical concern for right arm abscess.  Currently no clinical concern for necrotizing fasciitis.  Stable vitals and not currently septic.  Recommending MRI with and without contrast to further characterize extent of infection.  Scheduled for I&D tomorrow afternoon.  N.p.o. after midnight.  Admit to medicine.  Consult infectious disease for subsequent antibiotic management.  Full consult note to follow.  Ernestina Columbia M.D. Orthopaedic Surgery [facility]

## 2021-07-09 NOTE — ED Notes (Signed)
CT tech stated that pt was unable to complete scan because of pain. RN will be notified.

## 2021-07-09 NOTE — Assessment & Plan Note (Signed)
-  SIRS criteria met with  elevated white blood cell count,       Component Value Date/Time   WBC 21.1 (H) 07/09/2021 1900   LYMPHSABS 1.9 07/09/2021 1900         RR >20 Today's Vitals   07/09/21 1558 07/09/21 1818  BP: 123/74   Pulse: 84   Resp: (!) 24   Temp: 99.2 F (37.3 C)   SpO2: 100%   PainSc:  6      This patient meets SIRS Criteria and may be septic.      Vitals:   07/09/21 1558  BP: 123/74  Pulse: 84  Resp: (!) 24  Temp: 99.2 F (37.3 C)  SpO2: 100%        - Obtain serial lactic acid and procalcitonin level.  - Initiated IV antibiotics in ER: Antibiotics Given (last 72 hours)    Date/Time Action Medication Dose Rate   07/09/21 2029 New Bag/Given   vancomycin (VANCOREADY) IVPB 1500 mg/300 mL 1,500 mg 150 mL/hr   07/09/21 2139 New Bag/Given   piperacillin-tazobactam (ZOSYN) IVPB 3.375 g 3.375 g 12.5 mL/hr      Will continue     - await results of blood    - Rehydrate aggressively  Intravenous fluids were administered,     9:46 PM

## 2021-07-09 NOTE — Assessment & Plan Note (Signed)
-   will replace and repeat in AM,  check magnesium level and replace as needed ° °

## 2021-07-09 NOTE — Progress Notes (Signed)
Pharmacy Antibiotic Note  Justin Fisher is a 36 y.o. male admitted on 07/09/2021 with cellulitisabscess.  PMH is significant for IV drug use. Orthopedics has been consulted and plan is for I&D tomorrow afternoon. Pharmacy has been consulted for vancomycin and zosyn dosing dosing.  Patient remains afebrile, WBC is elevated at 21.1. Scr appears to be at baseline of 0.6 (will round up to 0.8 for AUC dosing).   Plan: Piperacillin-tazobactam 3.375g q8h Vancomycin 1500 mg IV x 1 Vancomycin 1250 mg q 12h (estimated AUC 483) Goal AUC 400-550 Follow-up cultures Monitor renal function    Temp (24hrs), Avg:99.2 F (37.3 C), Min:99.2 F (37.3 C), Max:99.2 F (37.3 C)  Recent Labs  Lab 07/09/21 1900  WBC 21.1*    CrCl cannot be calculated (Patient's most recent lab result is older than the maximum 21 days allowed.).    No Known Allergies  Antimicrobials this admission: 1/4 piperacillin-tazobactam >>  1/4 vancomycin >>   Dose adjustments this admission: none  Microbiology results: 1/4 BCx: pending 1/4 Wound Cx: pending  Thank you for involving pharmacy in this patient's care.  Arnette Felts, PharmD PGY1 Ambulatory Care Pharmacy Resident 07/09/2021 9:16 PM  **Pharmacist phone directory can be found on [email or web address] listed under [facility]**

## 2021-07-09 NOTE — ED Notes (Signed)
Unable to obtain IV access after multiple attempts, IV team consult order placed to assist with u/s

## 2021-07-09 NOTE — ED Provider Triage Note (Signed)
Emergency Medicine Provider Triage Evaluation Note  Justin Fisher , a 36 y.o. male  was evaluated in triage.  Pt complains of right arm swelling.  Pt was seen her on 1/2.  Pt had gas along humerus.  Pt left ama before being seen.    Review of Systems  Positive: fever Negative: numbness  Physical Exam  There were no vitals taken for this visit. Gen:   Awake, in pain Resp:  Normal effort  MSK:  Limited range of motion right arm   Other:    Medical Decision Making  Medically screening exam initiated at 3:04 PM.  Appropriate orders placed.  Justin Fisher was informed that the remainder of the evaluation will be completed by another provider, this initial triage assessment does not replace that evaluation, and the importance of remaining in the ED until their evaluation is complete.  Labs ordered,  Pt had significant infection    Justin Fisher, [place] 07/09/21 1506
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
## 2021-07-09 NOTE — Assessment & Plan Note (Addendum)
Obtain Hemoccult stool and anemia panel possibly in the setting of poor p.o. intake Repeat CBC and transfuse as needed for HB <7.5

## 2021-07-09 NOTE — ED Notes (Signed)
Pt's arm is oozing brown, fowl smelling liquid. Wrapped in gauze.

## 2021-07-09 NOTE — Assessment & Plan Note (Addendum)
Severe wound infection vs  Nec fasiatis will likely need debridement and /or amputation Orthopedics is aware keep NPO plant for OR in AM -admit per cellulitis/wound infection  protocol will    continue current antibiotic choice Vancomycin and Zosyn      plain films showed:  evidence of air      Will obtain MRSA screening,      obtain blood cultures if febrile or septic     further antibiotic adjustment pending above results  add clinda please call ID in AM

## 2021-07-09 NOTE — Assessment & Plan Note (Signed)
Obtain urine electrolytes gently rehydrate   
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
## 2021-07-09 NOTE — H&P (Signed)
Justin Fisher IBB:[id number] DOB: [birth year] DOA: 07/09/2021     PCP: Patient, No Pcp Per (Inactive)   Outpatient Specialists: NONE    Patient arrived to ER on 07/09/21 at 1316 Referred by Attending Long, Wonda Olds, MD   Patient coming from: home Lives With roomates  Chief Complaint:    Chief Complaint  Patient presents with   Arm Injury    Right arm     HPI: Justin Fisher is a 36 y.o. male with medical history significant of IV drug use    Presented with   severe pain and swelling of right arm increase right arm pain. pt shot heroine into right arm 5 days ago. Pt arm swollen, red.   Wound has been oozing brown, fowl smelling liquid Pt admits to injecting IV drugs . Pt reports he used a needle with blood in it.  Pt went to [facility] 2 days ago and had an xray shows large amount of gas.  he left AMA Pt reports increased pain and swelling.   Patient is left-handed then he has been injecting his right arm. Pain has been severe and progressively worsening with swelling of his upper as well as lower arm as well as hand pain.  Has been having generalized malaise  Denies EtOH abuse but he does smoke  Initial COVID TEST  NEGATIVE   Lab Results  Component Value Date   [place] NEGATIVE 07/09/2021     Regarding pertinent Chronic problems:   Hyperlipidemia -not on statins Lipid Panel     Component Value Date/Time   CHOL 215 (H) 09/12/2015 0635   TRIG 275 (H) 09/12/2015 0635   HDL 32 (L) 09/12/2015 0635   CHOLHDL 6.7 09/12/2015 0635   VLDL 55 (H) 09/12/2015 0635   LDLCALC 128 (H) 09/12/2015 4503       While in ER:   Right humerus Progression of soft tissue gas throughout the right upper arm compatible with soft tissue infection. No skeletal abnormality.  LEFT forearm Two linear radiopaque foreign bodies as described. No acute fracture or dislocation.  Ordered CT  Started on vanc and zosyn   CT ArM  ordered  Unable to obtain MRI given foreign  bodies  Following Medications were ordered in ER: Medications  HYDROmorphone (DILAUDID) injection 1 mg (1 mg Intravenous Given 07/09/21 1925)  vancomycin (VANCOREADY) IVPB 1500 mg/300 mL (1,500 mg Intravenous New Bag/Given 07/09/21 2029)  piperacillin-tazobactam (ZOSYN) IVPB 3.375 g (has no administration in time range)  potassium chloride SA (KLOR-CON M) CR tablet 40 mEq (has no administration in time range)  vancomycin (VANCOREADY) IVPB 1250 mg/250 mL (has no administration in time range)  oxyCODONE-acetaminophen (PERCOCET/ROXICET) 5-325 MG per tablet 2 tablet (2 tablets Oral Given 07/09/21 1624)  ondansetron (ZOFRAN) injection 4 mg (4 mg Intravenous Given 07/09/21 1925)    _______________________________________________________ ER Provider Called:  ortho  Dr. Mable Fill They Recommend admit to medicine    SEEN in ER   ED Triage Vitals  Enc Vitals Group     BP 07/09/21 1558 123/74     Pulse Rate 07/09/21 1558 84     Resp 07/09/21 1558 (!) 24     Temp 07/09/21 1558 99.2 F (37.3 C)     Temp src --      SpO2 07/09/21 1558 100 %     Weight --      Height --      Head Circumference --      Peak Flow --  Pain Score 07/09/21 1818 6     Pain Loc --      Pain Edu? --      Excl. in [place]? --   TMAX(24)@     _________________________________________ Significant initial  Findings: Abnormal Labs Reviewed  CBC WITH DIFFERENTIAL/PLATELET - Abnormal; Notable for the following components:      Result Value   WBC 21.1 (*)    RBC 3.42 (*)    Hemoglobin 7.5 (*)    HCT 24.2 (*)    MCV 70.8 (*)    MCH 21.9 (*)    RDW 17.6 (*)    Platelets 600 (*)    Neutro Abs 18.4 (*)    All other components within normal limits  COMPREHENSIVE METABOLIC PANEL - Abnormal; Notable for the following components:   Sodium 131 (*)    Potassium 2.4 (*)    CO2 21 (*)    Glucose, Bld 114 (*)    Calcium 8.2 (*)    Total Protein 6.4 (*)    Albumin 2.0 (*)    All other components within normal limits      ECG:  Ordered Personally reviewed by me showing: HR : 101 Rhythm: Sinus tachycardia    no evidence of ischemic changes QTC 463   ____________________ This patient meets SIRS Criteria and may be septic.    The recent clinical data is shown below. Vitals:   07/09/21 1558  BP: 123/74  Pulse: 84  Resp: (!) 24  Temp: 99.2 F (37.3 C)  SpO2: 100%    WBC     Component Value Date/Time   WBC 21.1 (H) 07/09/2021 1900   LYMPHSABS 1.9 07/09/2021 1900   MONOABS 0.8 07/09/2021 1900   EOSABS 0.0 07/09/2021 1900   BASOSABS 0.0 07/09/2021 1900        Lactic Acid, Venous    Component Value Date/Time   LATICACIDVEN 1.2 07/09/2021 2200     Procalcitonin 1.0 Lactic Acid, Venous    UA ordered   Results for orders placed or performed during the hospital encounter of 03/10/17  Blood culture (routine x 2)     Status: None   Collection Time: 03/10/17  4:51 PM   Specimen: BLOOD  Result Value Ref Range Status   Specimen Description BLOOD LEFT ANTECUBITAL  Final   Special Requests   Final    BOTTLES DRAWN AEROBIC AND ANAEROBIC Blood Culture adequate volume   Culture NO GROWTH 5 DAYS  Final   Report Status 03/15/2017 FINAL  Final  Blood culture (routine x 2)     Status: None   Collection Time: 03/10/17  5:40 PM   Specimen: BLOOD LEFT HAND  Result Value Ref Range Status   Specimen Description BLOOD LEFT HAND  Final   Special Requests IN PEDIATRIC BOTTLE Blood Culture adequate volume  Final   Culture NO GROWTH 5 DAYS  Final   Report Status 03/15/2017 FINAL  Final     _______________________________________________ Hospitalist was called for admission for right arm infection   The following Work up has been ordered so far:  Orders Placed This Encounter  Procedures   Blood culture (routine x 2)   Aerobic Culture w Gram Stain (superficial specimen)   Resp Panel by RT-PCR (Flu A&B, Covid) Nasopharyngeal Swab   DG Humerus Right   MR HUMERUS RIGHT W WO CONTRAST   DG Forearm Left    CBC with Differential   Comprehensive metabolic panel   Diet NPO time specified Except for: Sips with Meds  Low Sugar G2 for Diabetic Patients (355 ML)  [SUPPLIED BY DIETARY]   Diet per anesthesia ENHANCED RECOVERY AFTER SURGERY (ERAS) Pathway.  Clear liquids after midnight until 4:30am. Finish prescribed drink (Pre-Surgery Ensure or G2 if patient is diabetic) by 4:30am. NPO except meds with a sip of water at 4:30am.   Consult to orthopedic surgery  Consult   vancomycin per pharmacy consult   piperacillin-tazobactam (ZOSYN) per pharmacy consult   Consult for [facility] Admission     OTHER Significant initial  Findings:  labs showing:    Recent Labs  Lab 07/09/21 1900  NA 131*  K 2.4*  CO2 21*  GLUCOSE 114*  BUN 16  CREATININE 0.64  CALCIUM 8.2*  MG 1.9  PHOS 4.6    Cr   stable,   Lab Results  Component Value Date   CREATININE 0.64 07/09/2021   CREATININE 1.04 03/10/2017   CREATININE 0.76 06/13/2016    Recent Labs  Lab 07/09/21 1900  AST 21  ALT 18  ALKPHOS 81  BILITOT 0.6  PROT 6.4*  ALBUMIN 2.0*   Lab Results  Component Value Date   CALCIUM 8.2 (L) 07/09/2021    Plt: Lab Results  Component Value Date   PLT 600 (H) 07/09/2021       Recent Labs  Lab 07/09/21 1900  WBC 21.1*  NEUTROABS 18.4*  HGB 7.5*  HCT 24.2*  MCV 70.8*  PLT 600*    HG/HCT Down     Component Value Date/Time   HGB 7.5 (L) 07/09/2021 1900   HCT 24.2 (L) 07/09/2021 1900   MCV 70.8 (L) 07/09/2021 1900      Cardiac Panel (last 3 results) Recent Labs    07/09/21 1900  CKTOTAL 15*       Cultures:    Component Value Date/Time   SDES BLOOD LEFT HAND 03/10/2017 1740   SPECREQUEST IN PEDIATRIC BOTTLE Blood Culture adequate volume 03/10/2017 1740   CULT NO GROWTH 5 DAYS 03/10/2017 1740   REPTSTATUS 03/15/2017 FINAL 03/10/2017 1740     Radiological Exams on Admission: DG Forearm Left  Result Date: 07/09/2021 CLINICAL DATA:  Evaluate for foreign object.  EXAM: LEFT FOREARM - 2 VIEW COMPARISON:  Radiograph dated 04/15/2020. FINDINGS: There is a 6 mm linear radiopaque foreign object in the soft tissues of the distal forearm lateral to the distal third of the radius as seen on the prior radiograph. An additional 2.5 cm linear radiopaque object noted in the lateral soft tissues of the mid forearm. This is only seen on the AP view. No acute fracture or dislocation. The bones are well mineralized. No significant arthritic changes. IMPRESSION: Two linear radiopaque foreign bodies as described. No acute fracture or dislocation. Electronically Signed   By: Anner Crete M.D.   On: 07/09/2021 20:43   DG Humerus Right  Result Date: 07/09/2021 CLINICAL DATA:  Right arm pain.  IV drug abuse. EXAM: RIGHT HUMERUS - 2+ VIEW COMPARISON:  07/07/2021 FINDINGS: Extensive gas throughout the soft tissues of the upper arm on the right has progressed in the interval. Findings compatible with extensive soft tissue infection. No fracture or osteomyelitis identified. IMPRESSION: Progression of soft tissue gas throughout the right upper arm compatible with soft tissue infection. No skeletal abnormality. Electronically Signed   By: Franchot Gallo M.D.   On: 07/09/2021 18:32   _______________________________________________________________________________________________________ Latest  Blood pressure 123/74, pulse 84, temperature 99.2 F (37.3 C), resp. rate (!) 24, SpO2 100 %.   Vitals  labs and  radiology finding personally reviewed  Review of Systems:    Pertinent positives include: chills, fatigue,   Constitutional:  No weight loss, night sweats, Fevers, weight loss  HEENT:  No headaches, Difficulty swallowing,Tooth/dental problems,Sore throat,  No sneezing, itching, ear ache, nasal congestion, post nasal drip,  Cardio-vascular:  No chest pain, Orthopnea, PND, anasarca, dizziness, palpitations.no Bilateral lower extremity swelling  GI:  No heartburn, indigestion,  abdominal pain, nausea, vomiting, diarrhea, change in bowel habits, loss of appetite, melena, blood in stool, hematemesis Resp:  no shortness of breath at rest. No dyspnea on exertion, No excess mucus, no productive cough, No non-productive cough, No coughing up of blood.No change in color of mucus.No wheezing. Skin:  no rash or lesions. No jaundice GU:  no dysuria, change in color of urine, no urgency or frequency. No straining to urinate.  No flank pain.  Musculoskeletal:  No joint pain or no joint swelling. No decreased range of motion. No back pain.  Psych:  No change in mood or affect. No depression or anxiety. No memory loss.  Neuro: no localizing neurological complaints, no tingling, no weakness, no double vision, no gait abnormality, no slurred speech, no confusion  All systems reviewed and apart from [place] all are negative _______________________________________________________________________________________________ Past Medical History:   Past Medical History:  Diagnosis Date   Anxiety    Suicidal thoughts       No past surgical history on file.  Social History:  Ambulatory   independently       reports that he has been smoking. He has been smoking an average of .5 packs per day. He has never used smokeless tobacco. He reports current alcohol use. He reports current drug use. Drug: IV.     Family History: No family history of diabetes   ______________________________________________________________________________________________ Allergies: No Known Allergies   Prior to Admission medications   Medication Sig Start Date End Date Taking? Authorizing Provider  acetaminophen (TYLENOL) 500 MG tablet Take 1,000 mg by mouth every 6 (six) hours as needed for moderate pain or headache.   Yes [provider]  gabapentin (NEURONTIN) 300 MG capsule Take 300 mg by mouth 3 (three) times daily.   Yes [provider]  hydrOXYzine (VISTARIL) 50 MG capsule  Take 50 mg by mouth 3 (three) times daily as needed for itching. 05/10/21  Yes [provider]  ibuprofen (ADVIL) 200 MG tablet Take 600 mg by mouth every 6 (six) hours as needed for mild pain or headache.   Yes [provider]  methocarbamol (ROBAXIN) 750 MG tablet Take 750 mg by mouth 2 (two) times daily as needed for muscle spasms. 03/04/21  Yes [provider]  mirtazapine (REMERON) 15 MG tablet Take 15 mg by mouth at bedtime. 07/07/21  Yes [provider]  sertraline (ZOLOFT) 100 MG tablet Take 2 tablets (200 mg total) by mouth daily. For depression 06/17/16  Yes Ethelene Hal, NP  traZODone (DESYREL) 100 MG tablet Take 1 tablet (100 mg total) by mouth at bedtime as needed for sleep (may repeat x 1). For sleep Patient taking differently: Take 100 mg by mouth at bedtime. For sleep 06/17/16  Yes Ethelene Hal, NP  gabapentin (NEURONTIN) 400 MG capsule Take 1 capsule (400 mg total) by mouth 3 (three) times daily. For agitation Patient not taking: Reported on 07/09/2021 06/17/16   Ethelene Hal, NP  hydrOXYzine (ATARAX/VISTARIL) 50 MG tablet Take 1 tablet (50 mg total) by mouth every 6 (six) hours as needed  for anxiety. Patient not taking: Reported on 07/09/2021 06/17/16   Ethelene Hal, NP  lamoTRIgine (LAMICTAL) 25 MG tablet Take 1 tablet (25 mg total) by mouth daily. For mood stabilization Patient not taking: Reported on 07/09/2021 06/17/16   Ethelene Hal, NP  meloxicam (MOBIC) 7.5 MG tablet Take 1 tablet (7.5 mg total) by mouth daily. Patient not taking: Reported on 07/09/2021 05/22/21   Chase Picket, MD  nicotine polacrilex (NICORETTE) 2 MG gum Take 1 each (2 mg total) by mouth as needed for smoking cessation. Patient not taking: Reported on 07/09/2021 06/17/16   Ethelene Hal, NP    ___________________________________________________________________________________________________ Physical Exam: Vitals with BMI  07/09/2021 07/07/2021 05/22/2021  Height - - -  Weight - - -  BMI - - -  Systolic [phone]  Diastolic 74 63 76  Pulse 84 105 79     1. General:  in   Acute distress complaining of severe pain   oxic acutely ill -appearing 2. Psychological: Alert and   Oriented 3. Head/ENT:    Dry Mucous Membranes                          Head Non traumatic, neck supple                         Poor Dentition 4. SKIN:  decreased Skin turgor,  Skin right arm severely swollen red with black eschar present draining purulent substance foul-smelling       5. Heart: Regular rate and rhythm  mild Murmur, no Rub or gallop 6. Lungs:  Clear to auscultation bilaterally, no wheezes or crackles   7. Abdomen: Soft,  non-tender, Non distended   8. Lower extremities: no clubbing, cyanosis, no  edema 9. Neurologically Grossly intact, moving all 4 extremities equally   10. MSK: Normal range of motion    Chart has been reviewed  ______________________________________________________________________________________________  Assessment/Plan 36 y.o. male with medical history significant of IV drug use  Admitted for right arm wound infection   Present on Admission:  Sepsis ([place])  Heroin use disorder, moderate, dependence ([place])  Anemia  Cellulitis  Hypokalemia  Hyponatremia  Tobacco abuse     Sepsis ([place])    -SIRS criteria met with  elevated white blood cell count,       Component Value Date/Time   WBC 21.1 (H) 07/09/2021 1900   LYMPHSABS 1.9 07/09/2021 1900         RR >20 Today's Vitals   07/09/21 1558 07/09/21 1818  BP: 123/74   Pulse: 84   Resp: (!) 24   Temp: 99.2 F (37.3 C)   SpO2: 100%   PainSc:  6      This patient meets SIRS Criteria and may be septic.      Vitals:   07/09/21 1558  BP: 123/74  Pulse: 84  Resp: (!) 24  Temp: 99.2 F (37.3 C)  SpO2: 100%        - Obtain serial lactic acid and procalcitonin level.  - Initiated IV antibiotics in ER: Antibiotics Given  (last 72 hours)     Date/Time Action Medication Dose Rate   07/09/21 2029 New Bag/Given   vancomycin (VANCOREADY) IVPB 1500 mg/300 mL 1,500 mg 150 mL/hr   07/09/21 2139 New Bag/Given   piperacillin-tazobactam (ZOSYN) IVPB 3.375 g 3.375 g 12.5 mL/hr       Will continue     -  await results of blood    - Rehydrate aggressively  Intravenous fluids were administered,     9:46 PM   Heroin use disorder, moderate, dependence (HCC) counseled re stopping Cocaine abuse Transition care const   Anemia Obtain Hemoccult stool and anemia panel possibly in the setting of poor p.o. intake Repeat CBC and transfuse as needed for HB <7.5  Cellulitis Severe wound infection vs  Nec fasiatis will likely need debridement and /or amputation Orthopedics is aware keep NPO plant for OR in AM -admit per  cellulitis/wound infection  protocol will    continue current antibiotic choice Vancomycin and Zosyn      plain films showed:  evidence of air      Will obtain MRSA screening,      obtain blood cultures  if febrile or septic     further antibiotic adjustment pending above results  add clinda please call ID in AM   Hypokalemia - will replace and repeat in AM,  check magnesium level and replace as needed   Hyponatremia Obtain urine electrolytes gently rehydrate  Tobacco abuse  - Spoke about importance of quitting spent 5 minutes discussing options for treatment, prior attempts at quitting, and dangers of smoking  -At this point patient is  interested in quitting  - would like nicotine gum, hold off on while NPo  - nursing tobacco cessation protocol   Other plan as per orders.  DVT prophylaxis:  SCD     Code Status:    Code Status: Prior FULL CODE  as per patient  I had personally discussed CODE STATUS with patient     Family Communication:   Family not at  Bedside    Disposition Plan:     likely will need placement for rehabilitation                          Following barriers  for discharge:                            Electrolytes corrected                               Anemia corrected                             Pain controlled with PO medications                             white count improving able to transition to PO antibiotics                             Will need to be able to tolerate PO                                                     Will need consultants to evaluate patient prior to discharge  Transition of care consulted                   Nutrition    consulted  Consults called: Orthopedics is aware  Admission status:  ED Disposition     ED Disposition  Admit   Condition  --   [place]: [place] [[id number]]  Level of Care: Progressive [102]  Admit to Progressive based on following criteria: MULTISYSTEM THREATS such as stable sepsis, metabolic/electrolyte imbalance with or without encephalopathy that is responding to early treatment.  May admit patient to [facility] or [facility] if equivalent level of care is available:: No  Covid Evaluation: Asymptomatic Screening Protocol (No Symptoms)  Diagnosis: Sepsis [facility]) [[id number]]  Admitting Physician: Toy Baker [[id number]]  Attending Physician: Toy Baker [[id number]]  Estimated length of stay: past midnight tomorrow  Certification:: I certify this patient will need inpatient services for at least 2 midnights            inpatient     I Expect 2 midnight stay secondary to severity of patient's current illness need for inpatient interventions justified by the following:  Severe lab/radiological/exam abnormalities including:   Abscess of right arm and extensive comorbidities including:  substance abuse    That are currently affecting medical management.   I expect  patient to be hospitalized for 2 midnights requiring inpatient medical care.  Patient is at high risk for adverse outcome (such as loss of life or  disability) if not treated.  Indication for inpatient stay as follows:    severe pain requiring acute inpatient management,    Need for operative/procedural  intervention     Need for IV antibiotics, IV fluids     Level of care       progressive tele indefinitely please discontinue once patient no longer qualifies COVID-19 Labs    Lab Results  Component Value Date   [place] 07/09/2021     Precautions: admitted as   Covid Negative    Julias Mould 07/10/2021, 12:14 AM    [facility]     after 2 AM please page floor coverage PA If 7AM-7PM, please contact the day team taking care of the patient using [email or web address]   Patient was evaluated in the context of the global COVID-19 pandemic, which necessitated consideration that the patient might be at risk for infection with the SARS-CoV-2 virus that causes COVID-19. Institutional protocols and algorithms that pertain to the evaluation of patients at risk for COVID-19 are in a state of rapid change based on information released by regulatory bodies including the CDC and federal and state organizations. These policies and algorithms were followed during the patient's care.

## 2021-07-09 NOTE — ED Provider Notes (Addendum)
[facility] EMERGENCY DEPARTMENT Provider Note   CSN: [id number] Arrival date & time: 07/09/21  1316     History  Chief Complaint  Patient presents with   Arm Injury    Right arm     Justin Fisher is a 36 y.o. male.  Pt complains of swelling and pus draining from arm.  Pt admits to injecting IV drugs. Pt reports he used a needle with blood in it.  Pt went to [facility] 2 days ago and had an xray shows large amount of gas.  Pt reports increased pain and swelling.    The history is provided by the patient. No language interpreter was used.  Arm Injury Location:  Arm Injury: no   Pain details:    Radiates to:  Does not radiate   Severity:  Moderate   Onset quality:  Gradual   Timing:  Constant   Progression:  Worsening Dislocation: no   Tetanus status:  Unknown Relieved by:  Nothing Worsened by:  Nothing Ineffective treatments:  None tried Associated symptoms: swelling       Home Medications Prior to Admission medications   Medication Sig Start Date End Date Taking? Authorizing Provider  clindamycin (CLEOCIN) 150 MG capsule Take 3 capsules (450 mg total) by mouth 3 (three) times daily. 03/10/17   Pisciotta, Joni Reining, PA-C  gabapentin (NEURONTIN) 400 MG capsule Take 1 capsule (400 mg total) by mouth 3 (three) times daily. For agitation 06/17/16   Laveda Abbe, NP  hydrOXYzine (ATARAX/VISTARIL) 50 MG tablet Take 1 tablet (50 mg total) by mouth every 6 (six) hours as needed for anxiety. 06/17/16   Laveda Abbe, NP  lamoTRIgine (LAMICTAL) 25 MG tablet Take 1 tablet (25 mg total) by mouth daily. For mood stabilization 06/17/16   Laveda Abbe, NP  meloxicam (MOBIC) 7.5 MG tablet Take 1 tablet (7.5 mg total) by mouth daily. 05/22/21   LampteyBritta Mccreedy, MD  nicotine polacrilex (NICORETTE) 2 MG gum Take 1 each (2 mg total) by mouth as needed for smoking cessation. 06/17/16   Laveda Abbe, NP  sertraline (ZOLOFT) 100 MG tablet  Take 2 tablets (200 mg total) by mouth daily. For depression 06/17/16   Laveda Abbe, NP  traZODone (DESYREL) 100 MG tablet Take 1 tablet (100 mg total) by mouth at bedtime as needed for sleep (may repeat x 1). For sleep 06/17/16   Laveda Abbe, NP      Allergies    Patient has no known allergies.    Review of Systems   Review of Systems  All other systems reviewed and are negative.  Physical Exam Updated Vital Signs BP 123/74    Pulse 84    Temp 99.2 F (37.3 C)    Resp (!) 24    SpO2 100%  Physical Exam Vitals reviewed.  Constitutional:      Appearance: Normal appearance.  HENT:     Head: Normocephalic.  Cardiovascular:     Rate and Rhythm: Normal rate.  Pulmonary:     Effort: Pulmonary effort is normal.  Musculoskeletal:        General: Swelling and tenderness present.  Skin:    Findings: Erythema present.  Neurological:     General: No focal deficit present.     Mental Status: He is alert.  Psychiatric:        Mood and Affect: Mood normal.    ED Results / Procedures / Treatments   Labs (all labs ordered  are listed, but only abnormal results are displayed) Labs Reviewed  CULTURE, BLOOD (ROUTINE X 2)  CULTURE, BLOOD (ROUTINE X 2)  CBC WITH DIFFERENTIAL/PLATELET  COMPREHENSIVE METABOLIC PANEL    EKG None  Radiology No results found.  Procedures Procedures    Medications Ordered in ED Medications  oxyCODONE-acetaminophen (PERCOCET/ROXICET) 5-325 MG per tablet 2 tablet (2 tablets Oral Given 07/09/21 1624)    ED Course/ Medical Decision Making/ A&P                           Medical Decision Making Amount and/or Complexity of Data Reviewed Labs: ordered. Radiology: ordered. Discussion of management or test interpretation with external provider(s): I discussed with Dr. Sherilyn Dacosta  Orthopaedist on call.  He request pt have an MRI with and without contrast.  He will see pt in am for debridement   Risk Decision regarding  hospitalization.              Final Clinical Impression(s) / ED Diagnoses Final diagnoses:  Cellulitis of right arm  Abscess of right arm    Rx / DC Orders ED Discharge Orders     None      Pt's care turned over to Dr. Micki Riley and labs pending    Osie Cheeks 07/09/21 1840    [name], PA-C 07/09/21 1842    Maia Plan, MD 07/09/21 [phone]

## 2021-07-10 ENCOUNTER — Inpatient Hospital Stay (HOSPITAL_COMMUNITY): Payer: BC Managed Care – PPO

## 2021-07-10 ENCOUNTER — Inpatient Hospital Stay (HOSPITAL_COMMUNITY): Payer: BC Managed Care – PPO | Admitting: Anesthesiology

## 2021-07-10 ENCOUNTER — Encounter (HOSPITAL_COMMUNITY)
Admission: EM | Disposition: A | Discharge status: Home / Self Care | Payer: Self-pay | Source: Home / Self Care | Attending: Internal Medicine

## 2021-07-10 ENCOUNTER — Encounter (HOSPITAL_COMMUNITY): Payer: Self-pay | Admitting: Internal Medicine

## 2021-07-10 ENCOUNTER — Other Ambulatory Visit: Payer: Self-pay

## 2021-07-10 DIAGNOSIS — R011 Cardiac murmur, unspecified: Secondary | ICD-10-CM | POA: Diagnosis not present

## 2021-07-10 DIAGNOSIS — A419 Sepsis, unspecified organism: Secondary | ICD-10-CM | POA: Diagnosis not present

## 2021-07-10 DIAGNOSIS — Z720 Tobacco use: Secondary | ICD-10-CM | POA: Diagnosis present

## 2021-07-10 HISTORY — PX: I & D EXTREMITY: SHX5045

## 2021-07-10 HISTORY — PX: APPLICATION OF WOUND VAC: SHX5189

## 2021-07-10 LAB — ECHOCARDIOGRAM COMPLETE
AR max vel: 2.03 cm2
AV Area VTI: 2.14 cm2
AV Area mean vel: 2.12 cm2
AV Mean grad: 6 mmHg
AV Peak grad: 11.4 mmHg
Ao pk vel: 1.69 m/s
Area-P 1/2: 3.68 cm2
S' Lateral: 3.5 cm

## 2021-07-10 LAB — LIPID PANEL
Cholesterol: 85 mg/dL (ref 0–200)
HDL: <10 mg/dL — ABNORMAL LOW (ref >40)
Triglycerides: 112 mg/dL (ref <150)
VLDL: 22 mg/dL (ref 0–40)

## 2021-07-10 LAB — COMPREHENSIVE METABOLIC PANEL
ALT: 15 U/L (ref 0–44)
AST: 18 U/L (ref 15–41)
Albumin: 1.9 g/dL — ABNORMAL LOW (ref 3.5–5.0)
Alkaline Phosphatase: 78 U/L (ref 38–126)
Anion gap: 6 (ref 5–15)
BUN: 15 mg/dL (ref 6–20)
CO2: 20 mmol/L — ABNORMAL LOW (ref 22–32)
Calcium: 8 mg/dL — ABNORMAL LOW (ref 8.9–10.3)
Chloride: 103 mmol/L (ref 98–111)
Creatinine, Ser: 0.63 mg/dL (ref 0.61–1.24)
GFR, Estimated: >60 mL/min (ref >60)
Glucose, Bld: 123 mg/dL — ABNORMAL HIGH (ref 70–99)
Potassium: 2.7 mmol/L — CL (ref 3.5–5.1)
Sodium: 129 mmol/L — ABNORMAL LOW (ref 135–145)
Total Bilirubin: 0.5 mg/dL (ref 0.3–1.2)
Total Protein: 6 g/dL — ABNORMAL LOW (ref 6.5–8.1)

## 2021-07-10 LAB — CBC WITH DIFFERENTIAL/PLATELET
Abs Immature Granulocytes: 0 10*3/uL (ref 0.00–0.07)
Band Neutrophils: 4 %
Basophils Absolute: 0 10*3/uL (ref 0.0–0.1)
Basophils Relative: 0 %
Eosinophils Absolute: 0 10*3/uL (ref 0.0–0.5)
Eosinophils Relative: 0 %
HCT: 24.7 % — ABNORMAL LOW (ref 39.0–52.0)
Hemoglobin: 7.2 g/dL — ABNORMAL LOW (ref 13.0–17.0)
Lymphocytes Relative: 16 %
Lymphs Abs: 2.5 10*3/uL (ref 0.7–4.0)
MCH: 21.4 pg — ABNORMAL LOW (ref 26.0–34.0)
MCHC: 29.1 g/dL — ABNORMAL LOW (ref 30.0–36.0)
MCV: 73.5 fL — ABNORMAL LOW (ref 80.0–100.0)
Monocytes Absolute: 0.6 10*3/uL (ref 0.1–1.0)
Monocytes Relative: 4 %
Neutro Abs: 12.6 10*3/uL — ABNORMAL HIGH (ref 1.7–7.7)
Neutrophils Relative %: 76 %
Platelets: 543 10*3/uL — ABNORMAL HIGH (ref 150–400)
RBC: 3.36 MIL/uL — ABNORMAL LOW (ref 4.22–5.81)
RDW: 18.1 % — ABNORMAL HIGH (ref 11.5–15.5)
WBC: 15.7 10*3/uL — ABNORMAL HIGH (ref 4.0–10.5)
nRBC: 0 % (ref 0.0–0.2)
nRBC: 0 /100 WBC

## 2021-07-10 LAB — SURGICAL PCR SCREEN
MRSA, PCR: NEGATIVE
Staphylococcus aureus: POSITIVE — AB

## 2021-07-10 LAB — POCT I-STAT, CHEM 8
BUN: 10 mg/dL (ref 6–20)
Calcium, Ion: 1.08 mmol/L — ABNORMAL LOW (ref 1.15–1.40)
Chloride: 102 mmol/L (ref 98–111)
Creatinine, Ser: 0.3 mg/dL — ABNORMAL LOW (ref 0.61–1.24)
Glucose, Bld: 108 mg/dL — ABNORMAL HIGH (ref 70–99)
HCT: 30 % — ABNORMAL LOW (ref 39.0–52.0)
Hemoglobin: 10.2 g/dL — ABNORMAL LOW (ref 13.0–17.0)
Potassium: 3.4 mmol/L — ABNORMAL LOW (ref 3.5–5.1)
Sodium: 135 mmol/L (ref 135–145)
TCO2: 22 mmol/L (ref 22–32)

## 2021-07-10 LAB — OSMOLALITY, URINE: Osmolality, Ur: 278 mOsm/kg — ABNORMAL LOW (ref 300–900)

## 2021-07-10 LAB — PREALBUMIN: Prealbumin: 5.3 mg/dL — ABNORMAL LOW (ref 18–38)

## 2021-07-10 LAB — PREPARE RBC (CROSSMATCH)

## 2021-07-10 LAB — ABO/RH: ABO/RH(D): B POS

## 2021-07-10 LAB — HEMOGLOBIN A1C
Hgb A1c MFr Bld: 5.3 % (ref 4.8–5.6)
Mean Plasma Glucose: 105.41 mg/dL

## 2021-07-10 LAB — HIV ANTIBODY (ROUTINE TESTING W REFLEX): HIV Screen 4th Generation wRfx: NONREACTIVE

## 2021-07-10 LAB — CORTISOL-AM, BLOOD: Cortisol - AM: 23.8 ug/dL — ABNORMAL HIGH (ref 6.7–22.6)

## 2021-07-10 LAB — PHOSPHORUS: Phosphorus: 3.1 mg/dL (ref 2.5–4.6)

## 2021-07-10 LAB — MAGNESIUM: Magnesium: 1.7 mg/dL (ref 1.7–2.4)

## 2021-07-10 SURGERY — IRRIGATION AND DEBRIDEMENT EXTREMITY
Anesthesia: General | Site: Arm Upper | Laterality: Right

## 2021-07-10 MED ORDER — TRANEXAMIC ACID-NACL 1000-0.7 MG/100ML-% IV SOLN
1000.0000 mg | INTRAVENOUS | Status: AC
  Administered 2021-07-10: 1000 mg via INTRAVENOUS
  Filled 2021-07-10: qty 100

## 2021-07-10 MED ORDER — KETAMINE HCL 50 MG/5ML IJ SOSY
PREFILLED_SYRINGE | INTRAMUSCULAR | Status: AC
  Filled 2021-07-10: qty 10

## 2021-07-10 MED ORDER — SUCCINYLCHOLINE CHLORIDE 200 MG/10ML IV SOSY
PREFILLED_SYRINGE | INTRAVENOUS | Status: AC
  Filled 2021-07-10: qty 10

## 2021-07-10 MED ORDER — CHLORHEXIDINE GLUCONATE 0.12 % MT SOLN
OROMUCOSAL | Status: AC
  Filled 2021-07-10: qty 15

## 2021-07-10 MED ORDER — FENTANYL CITRATE (PF) 250 MCG/5ML IJ SOLN
INTRAMUSCULAR | Status: AC
  Filled 2021-07-10: qty 5

## 2021-07-10 MED ORDER — PROPOFOL 10 MG/ML IV BOLUS
INTRAVENOUS | Status: AC
  Filled 2021-07-10: qty 20

## 2021-07-10 MED ORDER — HYDROMORPHONE HCL 1 MG/ML IJ SOLN
1.0000 mg | INTRAMUSCULAR | Status: AC | PRN
  Administered 2021-07-10 (×3): 1 mg via INTRAVENOUS
  Filled 2021-07-10 (×4): qty 1

## 2021-07-10 MED ORDER — ORAL CARE MOUTH RINSE: 15.0000 mL | Freq: Once | OROMUCOSAL | Status: DC

## 2021-07-10 MED ORDER — POTASSIUM CHLORIDE CRYS ER 20 MEQ PO TBCR
40.0000 meq | EXTENDED_RELEASE_TABLET | ORAL | Status: AC
  Administered 2021-07-10 (×2): 40 meq via ORAL
  Filled 2021-07-10 (×2): qty 2

## 2021-07-10 MED ORDER — SODIUM CHLORIDE 0.9 % IR SOLN
Status: DC | PRN
  Administered 2021-07-10: 3000 mL

## 2021-07-10 MED ORDER — ONDANSETRON HCL 4 MG/2ML IJ SOLN
INTRAMUSCULAR | Status: AC
  Filled 2021-07-10: qty 4

## 2021-07-10 MED ORDER — FENTANYL CITRATE (PF) 250 MCG/5ML IJ SOLN
INTRAMUSCULAR | Status: DC | PRN
  Administered 2021-07-10: 50 ug via INTRAVENOUS
  Administered 2021-07-10: 100 ug via INTRAVENOUS
  Administered 2021-07-10: 150 ug via INTRAVENOUS
  Administered 2021-07-10 (×4): 50 ug via INTRAVENOUS

## 2021-07-10 MED ORDER — POVIDONE-IODINE 10 % EX SWAB: 2.0000 "application " | Freq: Once | CUTANEOUS | Status: DC

## 2021-07-10 MED ORDER — ONDANSETRON HCL 4 MG/2ML IJ SOLN: 4.0000 mg | Freq: Once | INTRAMUSCULAR | Status: DC | PRN

## 2021-07-10 MED ORDER — DEXAMETHASONE SODIUM PHOSPHATE 10 MG/ML IJ SOLN
INTRAMUSCULAR | Status: AC
  Filled 2021-07-10: qty 2

## 2021-07-10 MED ORDER — MIDAZOLAM HCL 2 MG/2ML IJ SOLN
INTRAMUSCULAR | Status: AC
  Filled 2021-07-10: qty 2

## 2021-07-10 MED ORDER — CLINDAMYCIN PHOSPHATE 900 MG/50ML IV SOLN
900.0000 mg | Freq: Three times a day (TID) | INTRAVENOUS | Status: DC
  Administered 2021-07-10 – 2021-07-12 (×7): 900 mg via INTRAVENOUS
  Filled 2021-07-10 (×10): qty 50

## 2021-07-10 MED ORDER — LIDOCAINE 2% (20 MG/ML) 5 ML SYRINGE
INTRAMUSCULAR | Status: AC
  Filled 2021-07-10: qty 5

## 2021-07-10 MED ORDER — CHLORHEXIDINE GLUCONATE 4 % EX LIQD
60.0000 mL | Freq: Once | CUTANEOUS | Status: AC
  Administered 2021-07-10: 4 via TOPICAL
  Filled 2021-07-10: qty 60

## 2021-07-10 MED ORDER — MORPHINE SULFATE (PF) 2 MG/ML IV SOLN
2.0000 mg | INTRAVENOUS | Status: DC | PRN
Start: 2021-07-10 — End: 2021-07-11
  Administered 2021-07-10 – 2021-07-11 (×6): 2 mg via INTRAVENOUS
  Filled 2021-07-10 (×6): qty 1

## 2021-07-10 MED ORDER — POTASSIUM CHLORIDE 10 MEQ/100ML IV SOLN
INTRAVENOUS | Status: AC
  Administered 2021-07-10: 10 meq via INTRAVENOUS
  Filled 2021-07-10: qty 100

## 2021-07-10 MED ORDER — 0.9 % SODIUM CHLORIDE (POUR BTL) OPTIME
TOPICAL | Status: DC | PRN
  Administered 2021-07-10: 1000 mL

## 2021-07-10 MED ORDER — CEFAZOLIN SODIUM-DEXTROSE 2-4 GM/100ML-% IV SOLN: 2.0000 g | INTRAVENOUS | Status: DC

## 2021-07-10 MED ORDER — ROCURONIUM BROMIDE 10 MG/ML (PF) SYRINGE
PREFILLED_SYRINGE | INTRAVENOUS | Status: DC | PRN
Start: 2021-07-10 — End: 2021-07-10
  Administered 2021-07-10: 50 mg via INTRAVENOUS
  Administered 2021-07-10: 20 mg via INTRAVENOUS

## 2021-07-10 MED ORDER — PIPERACILLIN-TAZOBACTAM 3.375 G IVPB
3.3750 g | Freq: Three times a day (TID) | INTRAVENOUS | Status: DC
  Administered 2021-07-10 – 2021-07-14 (×11): 3.375 g via INTRAVENOUS
  Filled 2021-07-10 (×11): qty 50

## 2021-07-10 MED ORDER — ROCURONIUM BROMIDE 10 MG/ML (PF) SYRINGE
PREFILLED_SYRINGE | INTRAVENOUS | Status: AC
  Filled 2021-07-10: qty 20

## 2021-07-10 MED ORDER — ONDANSETRON HCL 4 MG/2ML IJ SOLN
INTRAMUSCULAR | Status: DC | PRN
  Administered 2021-07-10: 4 mg via INTRAVENOUS

## 2021-07-10 MED ORDER — SODIUM CHLORIDE 0.9% IV SOLUTION: Freq: Once | INTRAVENOUS | Status: AC

## 2021-07-10 MED ORDER — SUCCINYLCHOLINE CHLORIDE 200 MG/10ML IV SOSY
PREFILLED_SYRINGE | INTRAVENOUS | Status: DC | PRN
  Administered 2021-07-10: 130 mg via INTRAVENOUS

## 2021-07-10 MED ORDER — KETOROLAC TROMETHAMINE 30 MG/ML IJ SOLN
INTRAMUSCULAR | Status: AC
  Filled 2021-07-10: qty 1

## 2021-07-10 MED ORDER — KETAMINE HCL 50 MG/ML IJ SOLN
INTRAMUSCULAR | Status: DC | PRN
  Administered 2021-07-10: 20 mg via INTRAMUSCULAR
  Administered 2021-07-10: 30 mg via INTRAMUSCULAR

## 2021-07-10 MED ORDER — CHLORHEXIDINE GLUCONATE 0.12 % MT SOLN: 15.0000 mL | Freq: Once | OROMUCOSAL | Status: DC

## 2021-07-10 MED ORDER — SUGAMMADEX SODIUM 200 MG/2ML IV SOLN
INTRAVENOUS | Status: DC | PRN
Start: 2021-07-10 — End: 2021-07-10
  Administered 2021-07-10: 200 mg via INTRAVENOUS

## 2021-07-10 MED ORDER — PROPOFOL 10 MG/ML IV BOLUS
INTRAVENOUS | Status: DC | PRN
  Administered 2021-07-10: 150 mg via INTRAVENOUS

## 2021-07-10 MED ORDER — MIDAZOLAM HCL 2 MG/2ML IJ SOLN
INTRAMUSCULAR | Status: DC | PRN
  Administered 2021-07-10: 2 mg via INTRAVENOUS

## 2021-07-10 MED ORDER — IOHEXOL 350 MG/ML SOLN
100.0000 mL | Freq: Once | INTRAVENOUS | Status: AC | PRN
  Administered 2021-07-10: 100 mL via INTRAVENOUS

## 2021-07-10 MED ORDER — OXYCODONE HCL 5 MG/5ML PO SOLN: 5.0000 mg | Freq: Once | ORAL | Status: AC | PRN

## 2021-07-10 MED ORDER — HYDROMORPHONE HCL 1 MG/ML IJ SOLN
INTRAMUSCULAR | Status: AC
  Filled 2021-07-10: qty 1

## 2021-07-10 MED ORDER — LACTATED RINGERS IV SOLN: INTRAVENOUS | Status: DC

## 2021-07-10 MED ORDER — IOHEXOL 300 MG/ML  SOLN
75.0000 mL | Freq: Once | INTRAMUSCULAR | Status: AC | PRN
  Administered 2021-07-10: 75 mL via INTRAVENOUS

## 2021-07-10 MED ORDER — CEFAZOLIN SODIUM-DEXTROSE 2-4 GM/100ML-% IV SOLN
2.0000 g | INTRAVENOUS | Status: AC
  Administered 2021-07-10: 2 g via INTRAVENOUS
  Filled 2021-07-10: qty 100

## 2021-07-10 MED ORDER — HYDROMORPHONE HCL 1 MG/ML IJ SOLN
0.2500 mg | INTRAMUSCULAR | Status: DC | PRN
  Administered 2021-07-10 (×4): 0.5 mg via INTRAVENOUS

## 2021-07-10 MED ORDER — OXYCODONE HCL 5 MG PO TABS
ORAL_TABLET | ORAL | Status: AC
  Filled 2021-07-10: qty 1

## 2021-07-10 MED ORDER — SODIUM CHLORIDE 0.9 % IV SOLN: INTRAVENOUS | Status: DC

## 2021-07-10 MED ORDER — OXYCODONE HCL 5 MG PO TABS
5.0000 mg | ORAL_TABLET | Freq: Once | ORAL | Status: AC | PRN
Start: 2021-07-10 — End: 2021-07-10
  Administered 2021-07-10: 5 mg via ORAL

## 2021-07-10 MED ORDER — HYDROMORPHONE HCL 1 MG/ML IJ SOLN
1.5000 mg | Freq: Once | INTRAMUSCULAR | Status: AC
  Administered 2021-07-10: 1.5 mg via INTRAVENOUS
  Filled 2021-07-10: qty 1.5

## 2021-07-10 MED ORDER — POTASSIUM CHLORIDE 10 MEQ/100ML IV SOLN
INTRAVENOUS | Status: AC
  Filled 2021-07-10: qty 100

## 2021-07-10 MED ORDER — LIDOCAINE 2% (20 MG/ML) 5 ML SYRINGE
INTRAMUSCULAR | Status: DC | PRN
  Administered 2021-07-10: 30 mg via INTRAVENOUS

## 2021-07-10 MED ORDER — KETOROLAC TROMETHAMINE 30 MG/ML IJ SOLN
30.0000 mg | Freq: Once | INTRAMUSCULAR | Status: AC | PRN
  Administered 2021-07-10: 30 mg via INTRAVENOUS

## 2021-07-10 SURGICAL SUPPLY — 40 items
BAG COUNTER SPONGE SURGICOUNT (BAG) ×2 IMPLANT
BNDG ELASTIC 3X5.8 VLCR STR LF (GAUZE/BANDAGES/DRESSINGS) IMPLANT
BNDG GAUZE ELAST 4 BULKY (GAUZE/BANDAGES/DRESSINGS) ×6 IMPLANT
CANISTER WOUND CARE 500ML ATS (WOUND CARE) ×1 IMPLANT
CNTNR URN SCR LID CUP LEK RST (MISCELLANEOUS) IMPLANT
CONNECTOR Y ATS VAC SYSTEM (MISCELLANEOUS) ×1 IMPLANT
CONT SPEC 4OZ STRL OR WHT (MISCELLANEOUS) ×1
CORD BIPOLAR FORCEPS 12FT (ELECTRODE) IMPLANT
COVER SURGICAL LIGHT HANDLE (MISCELLANEOUS) ×2 IMPLANT
CUFF TOURN SGL QUICK 42 (TOURNIQUET CUFF) IMPLANT
DRAPE U-SHAPE 47X51 STRL (DRAPES) ×1 IMPLANT
DRSG PAD ABDOMINAL 8X10 ST (GAUZE/BANDAGES/DRESSINGS) ×2 IMPLANT
DRSG VAC ATS MED SENSATRAC (GAUZE/BANDAGES/DRESSINGS) ×1 IMPLANT
DURAPREP 26ML APPLICATOR (WOUND CARE) ×1 IMPLANT
ELECT REM PT RETURN 9FT ADLT (ELECTROSURGICAL)
ELECTRODE REM PT RTRN 9FT ADLT (ELECTROSURGICAL) IMPLANT
FACESHIELD WRAPAROUND (MASK) ×4 IMPLANT
FACESHIELD WRAPAROUND OR TEAM (MASK) IMPLANT
GLOVE SRG 8 PF TXTR STRL LF DI (GLOVE) ×2 IMPLANT
GLOVE SURG ENC MOIS LTX SZ7.5 (GLOVE) ×4 IMPLANT
GLOVE SURG MICRO LTX SZ7 (GLOVE) ×1 IMPLANT
GLOVE SURG UNDER POLY LF SZ8 (GLOVE) ×2
GOWN STRL REUS W/ TWL LRG LVL3 (GOWN DISPOSABLE) ×2 IMPLANT
GOWN STRL REUS W/ TWL XL LVL3 (GOWN DISPOSABLE) ×2 IMPLANT
GOWN STRL REUS W/TWL LRG LVL3 (GOWN DISPOSABLE) ×2
GOWN STRL REUS W/TWL XL LVL3 (GOWN DISPOSABLE) ×2
KIT BASIN OR (CUSTOM PROCEDURE TRAY) ×2 IMPLANT
KIT TURNOVER KIT B (KITS) ×2 IMPLANT
MANIFOLD NEPTUNE II (INSTRUMENTS) ×2 IMPLANT
NS IRRIG 1000ML POUR BTL (IV SOLUTION) ×4 IMPLANT
PACK SHOULDER (CUSTOM PROCEDURE TRAY) ×1 IMPLANT
PAD ARMBOARD 7.5X6 YLW CONV (MISCELLANEOUS) ×4 IMPLANT
SET CYSTO W/LG BORE CLAMP LF (SET/KITS/TRAYS/PACK) ×2 IMPLANT
SPONGE T-LAP 18X18 ~~LOC~~+RFID (SPONGE) ×4 IMPLANT
STAPLER VISISTAT 35W (STAPLE) ×2 IMPLANT
SWAB CULTURE ESWAB REG 1ML (MISCELLANEOUS) IMPLANT
TOWEL GREEN STERILE (TOWEL DISPOSABLE) ×2 IMPLANT
TOWEL GREEN STERILE FF (TOWEL DISPOSABLE) ×2 IMPLANT
UNDERPAD 30X36 HEAVY ABSORB (UNDERPADS AND DIAPERS) ×4 IMPLANT
WATER STERILE IRR 1000ML POUR (IV SOLUTION) ×2 IMPLANT

## 2021-07-10 NOTE — Transfer of Care (Signed)
Immediate Anesthesia Transfer of Care Note  Patient: Justin Fisher  Procedure(s) Performed: IRRIGATION AND DEBRIDEMENT RIGHT UPPER ARM (Right: Arm Upper) APPLICATION OF WOUND VAC (Right: Arm Upper)  Patient Location: PACU  Anesthesia Type:General  Level of Consciousness: awake  Airway & Oxygen Therapy: Patient Spontanous Breathing and Patient connected to face mask oxygen  Post-op Assessment: Report given to RN and Post -op Vital signs reviewed and stable  Post vital signs: Reviewed and stable  Last Vitals:  Vitals Value Taken Time  BP 150/64 07/10/21 1813  Temp    Pulse 88 07/10/21 1815  Resp 20 07/10/21 1815  SpO2 100 % 07/10/21 1815  Vitals shown include unvalidated device data.  Last Pain:  Vitals:   07/10/21 1553  TempSrc: Oral  PainSc:          Complications: No notable events documented.

## 2021-07-10 NOTE — Op Note (Addendum)
OPERATIVE NOTE  Justin Fisher male 36 y.o. 07/10/2021  PREOPERATIVE DIAGNOSIS: Extensive right arm soft tissue infection IV drug abuse  POSTOPERATIVE DIAGNOSIS: Extensive right arm soft tissue infection IV drug abuse  PROCEDURE(S): Right arm debridement, total wound size 25 cm x 12 cm x 1 cm (total area 300 cm2); skin, subcutaneous tissue, muscle, fascia, and bone (39767, 11047 x14) Incision and drainage of right arm abscesses (34193) Application negative pressure wound therapy with DME (79024)  SURGEON: Ernestina Columbia, M.D.  ASSISTANT(S): None  ANESTHESIA: General  FINDINGS: Preoperative Examination: Extensive soft tissue infection with multiple necrotic areas of skin, draining sinuses, and necrotic eschar on the right arm.  Extensive erythema over the arm and forearm.  Several areas of malodorous drainage noted.  Large eschar on the lateral/posterior arm proximally.  He is able to demonstrate some intact function in the EDC, FDP index and small finger, APB, dorsal interossei.  Sensation tact light touch distally in the median, radial, and ulnar distributions.  Warm and well-perfused distally.  Difficult to evaluate shoulder or elbow pain because he has pain everywhere with any movement, which is likely related to the extent of the soft tissue involvement.  Operative Findings: Extensive soft tissue necrosis of fat and fascia, extending throughout the posterior compartment of the arm down to the olecranon bursa.  Several necrotic areas of skin.  Abscess tracking posteriorly into scapular musculature.  Extensive pus and necrotic tissue throughout.  Following debridement, bleeding tissue throughout wound, with final wound measurements 25 cm x 12 cm x 1 cm.  IMPLANTS: * No implants in log *  INDICATIONS:  The patient is a 36 y.o. male with a history of IV drug abuse.  He presented to the ER with worsening right arm pain and was found to have an extensive soft tissue infection of the  right arm, which subsequently on advanced imaging was seen to extend proximally into some of the scapular musculature.  He understood the risks, benefits and alternatives to surgery which include but are not limited to bleeding, wound healing complications, infection, damage to surrounding structures, persistent pain, stiffness, lack of improvement, potential for subsequent arthritis or worsening of pre-existing arthritis, and need for further surgery, as well as complications related to anesthesia, cardiovascular complications, and death.  He also understood the potential for continued pain in that there were no guarantees of acceptable outcome.  After weighing these risks the patient opted to proceed with surgery.  TECHNIQUE: Patient was identified in the preoperative holding area.  The right shoulder and arm was marked by myself.  Consent was signed by myself and the patient.  No block was performed by anesthesia in the preoperative holding area.  Patient was taken to the operative suite and placed supine on the operative table.  Anesthesia was induced by the anesthesia team.  The patient was positioned appropriately for the procedure and all bony prominences were well padded.  A tourniquet was not used.  Preoperative antibiotics were given. The extremity was prepped and draped in the usual sterile fashion and surgical timeout was performed.  The patient was positioned laterally with a beanbag.  The devitalized areas were marked out on the skin, and these were incised sharply, and the skin was removed.  Cultures were obtained, as well as tissue sent for culture.  A thorough debridement was conducted, as following: Debridement type: Excisional Debridement Side: right Body Location: Right arm, shoulder, and scapula Tools used for debridement: scalpel, scissors, curette, and rongeur Pre-debridement Wound size (cm):  8 cm x 3 cm necrotic skin wound, over centimeter in depth 5 cm x 3 cm necrotic eschar 1  cm x 1 cm draining sinus Post-debridement Wound size (cm): Length: 25 cm        Width: 12 cm      Depth: 1 to 2 cm Debridement depth beyond dead/damaged tissue down to healthy viable tissue: yes Tissue layer involved: skin, subcutaneous tissue, muscle / fascia, bone Nature of tissue removed: Slough, Necrotic, Devitalized Tissue, Non-viable tissue, and Purulence Irrigation volume: 6 L    Irrigation fluid type: Normal Saline Infection PRESENT AT THE TIME OF SURGERY (PATOS):  Yes I encountered purulence in the posterior compartment of the arm, extending distally to the olecranon bursa and proximally through some of the scapular musculature At the tissue level: Predominantly muscle and fat, some extension and to the humeral bone Following thorough extensive debridement of all devitalized tissue and purulent material, a negative pressure wound therapy device was placed in the wound.  2 Lilly pads were attached to the wound VAC sponge and connected with a Y connector to a single wound VAC, which was 125 mmHg low continuous suction.  Patient was awakened from anesthesia and transferred to PACU in stable condition.  He tolerated the procedure well.  There were no complications.  POST OPERATIVE INSTRUCTIONS: Mobility: Out of bed as tolerated; elevate right upper extremity higher than heart is much as possible Pain control: Continue to wean/titrate to appropriate oral regimen DVT Prophylaxis: Per primary team Further surgical plans: May require subsequent debridements and likely ultimately plastics for coverage of right arm wound.  Given concern for forearm infection, CT with and without contrast of right forearm is indicated for possible surgical planning for possible debridement of the forearm.   RUE: Nonweightbearing, limited mobility due to wound vacs LUE: No restrictions RLE: No restrictions LLE: No restrictions Disposition: Pending further surgical treatment Dressing care:  Maintain wound vacs  at 125 mmHg low continuous suction; elevate right upper extremity higher than heart Follow-up: Please call [facility] [phone]) to schedule follow-op appointment for 2 weeks after surgery.  TOURNIQUET TIME: N/A  BLOOD LOSS: 50 mL         DRAINS: none         SPECIMEN: none       COMPLICATIONS:  * No complications entered in OR log *         DISPOSITION: PACU - hemodynamically stable.         CONDITION: stable   Ernestina Columbia M.D. Orthopaedic Surgery [facility]   Portions of the record have been created with voice recognition software.  Grammatical and punctuation errors, random word insertions, wrong-word or "sound-a-like" substitutions, pronoun errors (inaccuracies and/or substitutions), and/or incomplete sentences may have occurred due to the inherent limitations of voice recognition software.  Not all errors are caught or corrected.  Although every attempt is made to root out erroneous and incomplete transcription, the note may still not fully represent the intent or opinion of the author.  Read the chart carefully and recognize, using context, where errors/substitutions have occurred.  Any questions or concerns about the content of this note or information contained within the body of this dictation should be addressed directly with the author for clarification.

## 2021-07-10 NOTE — H&P (Signed)
PREOPERATIVE H&P  HPI: Justin Fisher is a 36 y.o. male who has presented today for surgery, with the diagnosis of extensive RUE infection related to IV drug abuse.  The various methods of treatment have been discussed with the patient and family.  After consideration of risks, benefits, and other options for treatment, the patient has consented to IRRIGATION AND DEBRIDEMENT RIGHT ARM as a surgical intervention.  The patient's history has been reviewed, patient examined, no change in status, stable for surgery.  I have reviewed the patient's chart and labs.  Questions were answered to the patient's satisfaction.    PMH: Past Medical History:  Diagnosis Date   Anxiety    Suicidal thoughts     Home Medications Allergies  No current facility-administered medications on file prior to encounter.   Current Outpatient Medications on File Prior to Encounter  Medication Sig Dispense Refill   acetaminophen (TYLENOL) 500 MG tablet Take 1,000 mg by mouth every 6 (six) hours as needed for moderate pain or headache.     gabapentin (NEURONTIN) 300 MG capsule Take 300 mg by mouth 3 (three) times daily.     hydrOXYzine (VISTARIL) 50 MG capsule Take 50 mg by mouth 3 (three) times daily as needed for itching.     ibuprofen (ADVIL) 200 MG tablet Take 600 mg by mouth every 6 (six) hours as needed for mild pain or headache.     methocarbamol (ROBAXIN) 750 MG tablet Take 750 mg by mouth 2 (two) times daily as needed for muscle spasms.     mirtazapine (REMERON) 15 MG tablet Take 15 mg by mouth at bedtime.     sertraline (ZOLOFT) 100 MG tablet Take 2 tablets (200 mg total) by mouth daily. For depression 60 tablet 0   traZODone (DESYREL) 100 MG tablet Take 1 tablet (100 mg total) by mouth at bedtime as needed for sleep (may repeat x 1). For sleep (Patient taking differently: Take 100 mg by mouth at bedtime. For sleep) 14 tablet 0   gabapentin (NEURONTIN) 400 MG capsule Take 1 capsule (400 mg total) by mouth 3  (three) times daily. For agitation (Patient not taking: Reported on 07/09/2021) 90 capsule 0   hydrOXYzine (ATARAX/VISTARIL) 50 MG tablet Take 1 tablet (50 mg total) by mouth every 6 (six) hours as needed for anxiety. (Patient not taking: Reported on 07/09/2021) 30 tablet 0   lamoTRIgine (LAMICTAL) 25 MG tablet Take 1 tablet (25 mg total) by mouth daily. For mood stabilization (Patient not taking: Reported on 07/09/2021) 30 tablet 0   meloxicam (MOBIC) 7.5 MG tablet Take 1 tablet (7.5 mg total) by mouth daily. (Patient not taking: Reported on 07/09/2021) 30 tablet 0   nicotine polacrilex (NICORETTE) 2 MG gum Take 1 each (2 mg total) by mouth as needed for smoking cessation. (Patient not taking: Reported on 07/09/2021) 100 tablet 0   No Known Allergies   PSH: No past surgical history on file.   Family History Social History  No family history on file.  Social History   Socioeconomic History   Marital status: Single    Spouse name: Not on file   Number of children: Not on file   Years of education: Not on file   Highest education level: Not on file  Occupational History   Not on file  Tobacco Use   Smoking status: Every Day    Packs/day: 0.50    Types: Cigarettes   Smokeless tobacco: Never  Substance and Sexual Activity   Alcohol use:  Yes   Drug use: Yes    Types: IV    Comment: Uses heroin daily as well as xanax, last use 2-3 mths   Sexual activity: Yes    Birth control/protection: None  Other Topics Concern   Not on file  Social History Narrative   Not on file   Social Determinants of Health   Financial Resource Strain: Not on file  Food Insecurity: Not on file  Transportation Needs: Not on file  Physical Activity: Not on file  Stress: Not on file  Social Connections: Not on file     Review of Systems: MSK: As noted per HPI above GI: No current Nausea/vomiting ENT: Denies sore throat, epistaxis CV: Denies chest pain Resp: No current shortness of breath  Other than  mentioned above, there are no Constitutional, Neurological, Psychiatric, ENT, Ophthalmological, Cardiovascular, Respiratory, GI, GU, Musculoskeletal, Integumentary, Lymphatic, Endocrine or Allergic issues.   Physical Examination: CV: Normal distal pulses Lungs: Unlabored respirations RUE: Erythema and tenderness extending from the shoulder down the forearm.  Several areas of malodorous drainage noted.  Large eschar on the lateral/posterior arm proximally.  He is able to demonstrate some intact function in the EDC, FDP index and small finger, APB, dorsal interossei.  Sensation tact light touch distally in the median, radial, and ulnar distributions.  Warm and well-perfused distally.  Difficult to evaluate shoulder or elbow pain because he has pain everywhere with any movement, which is likely related to the extent of the soft tissue involvement.  Assessment/Plan: IRRIGATION AND DEBRIDEMENT RIGHT ARM    Ernestina Columbia M.D. Orthopaedic Surgery [facility]  Review of this patient's medications prescribed by other providers does not in any way constitute an endorsement by this clinician of their use, indications, dosage, route, efficacy, interactions, or other clinical parameters.  Portions of the record have been created with voice recognition software.  Grammatical and punctuation errors, random word insertions, wrong-word or "sound-a-like" substitutions, pronoun errors (inaccuracies and/or substitutions), and/or incomplete sentences may have occurred due to the inherent limitations of voice recognition software.  Not all errors are caught or corrected.  Although every attempt is made to root out erroneous and incomplete transcription, the note may still not fully represent the intent or opinion of the author.  Read the chart carefully and recognize, using context, where errors/substitutions have occurred.  Any questions or concerns about the content of this note or  information contained within the body of this dictation should be addressed directly with the author for clarification.

## 2021-07-10 NOTE — Progress Notes (Signed)
PROGRESS NOTE  Justin Fisher  DOB: 1985/07/18  PCP: Patient, No Pcp Per (Inactive) TIR:[id number]  DOA: 07/09/2021  LOS: 1 day  Hospital Day: 2  Chief Complaint  Patient presents with   Arm Injury    Right arm    Brief narrative: Justin Fisher is a 36 y.o. male with PMH significant for IV drug abuse, hyperlipidemia, anxiety disorder, suicidal ideation. Patient presented to the ED on 07/09/2021 with complaint of severe pain and swelling of right arm.  Patient short heroin in the right arm 5 days ago after which it progressively became swollen, red, tender and started wheezing brown foul-smelling liquid. He was seen in the ED and was along on 1/2. Right arm x-ray at that time showed large amount of soft tissue gas concerning for cellulitis.Justin Fisher  He left AMA, continued to have worsening symptoms and hence returned back to ED.  In the ED, he had a temperature of 999.2, tachypneic, Labs with sodium low at 131, potassium low at 2.4, albumin low at 2 WBC count elevated to 21.1, hemoglobin 7.5, MCV 21, platelets 600 UDS positive for cocaine Right humerus x-ray showed progression of soft tissue gas throughout the right upper arm. Left forearm x-ray showed 2 linear radiopaque foreign bodies. Started on broad-spectrum antibiotics.   Admitted to hospitalist service. Orthopedics consulted. CT right humerus was obtained which showed extensive soft tissue swelling and air within the predominantly posterior and lateral aspect of right upper arm.  There is also cortical erosion within the humeral head greater tuberosity concerning for acute to subacute osteomyelitis.  There is also a rim-enhancing collection in the posterior to the mid to lateral aspect of the scapula suspicious for abscess.  Subjective: Patient was seen and examined this morning Afebrile, hemodynamically stable Pending I&D today.  Remains n.p.o. at this time.  Assessment/Plan: Sepsis secondary to right arm cellulitis/necrotizing  fasciitis/abscess -Blood culture sent.   -Started on IV Zosyn and IV vancomycin in the ED. -IV hydration -Ortho consult appreciated.   -Tentative plan for I&D today.  Remains n.p.o. at this time. -WBC count trending down.  No fever overnight. Recent Labs  Lab 07/09/21 1900 07/09/21 2200 07/10/21 0254  WBC 21.1*  --  15.7*  LATICACIDVEN  --  1.2  --   PROCALCITON 1.06  --   --    IV drug use -Uses IV heroin, cocaine -Monitor for withdrawal symptoms.  IV morphine low-dose as needed for pain control and withdrawal prevention -Counseled to quit.  Microcytic anemia -Unclear how acute it is.  Last labs from [facility] from 2016 with hemoglobin of 14. -Obtain anemia panel. Recent Labs    07/09/21 1900 07/09/21 2200 07/10/21 0254  HGB 7.5*  --  7.2*  MCV 70.8*  --  73.5*  FOLATE  --  10.8  --   RETICCTPCT 0.5  --   --    Hyponatremia -Sodium low at 131, further down to 129 today.  Probably hypovolemic and low solute intake.  Continue to monitor on IV fluid Recent Labs  Lab 07/09/21 1900 07/10/21 0254  NA 131* 129*   Hypokalemia -Significant low potassium at 2.4 yesterday.  Further down to 2.7 today.  IV and oral replacement given. Recent Labs  Lab 07/09/21 1900 07/10/21 0254  K 2.4* 2.7*  MG 1.9 1.7  PHOS 4.6 3.1   Mobility: Encourage ambulation Living condition:  Goals of care:   Code Status: Full Code  Nutritional status: There is no height or weight on file to calculate BMI.  Diet:  Diet Order             Diet NPO time specified  Diet effective now                  DVT prophylaxis:  SCDs Start: 07/09/21 2310   Antimicrobials: Broad-spectrum antibiotics Fluid: NS at 75 mill per hour Consultants: Orthopedics Family Communication: None at bedside  Status is: Inpatient  Continue in-hospital care because: Needs I&D, IV antibiotics. Level of care: Progressive   Dispo: The patient is from: Home              Anticipated d/c is to: Likely home  versus SNF for long-term antibiotics              Patient currently is not medically stable to d/c.   Difficult to place patient No     Infusions:   sodium chloride     clindamycin (CLEOCIN) IV     piperacillin-tazobactam (ZOSYN)  IV 3.375 g (07/10/21 1041)   vancomycin 1,250 mg (07/10/21 1244)    Scheduled Meds:  potassium chloride  40 mEq Oral Q2H    PRN meds: acetaminophen **OR** acetaminophen, albuterol, morphine injection   Antimicrobials: Anti-infectives (From admission, onward)    Start     Dose/Rate Route Frequency Ordered Stop   07/10/21 1300  clindamycin (CLEOCIN) IVPB 900 mg        900 mg 100 mL/hr over 30 Minutes Intravenous Every 8 hours 07/10/21 0018     07/10/21 1000  vancomycin (VANCOREADY) IVPB 1250 mg/250 mL        1,250 mg 166.7 mL/hr over 90 Minutes Intravenous Every 12 hours 07/09/21 2121     07/10/21 1000  piperacillin-tazobactam (ZOSYN) IVPB 3.375 g        3.375 g 12.5 mL/hr over 240 Minutes Intravenous Every 8 hours 07/10/21 0912     07/09/21 2200  piperacillin-tazobactam (ZOSYN) IVPB 3.375 g  Status:  Discontinued        3.375 g 12.5 mL/hr over 240 Minutes Intravenous Every 8 hours 07/09/21 1958 07/10/21 0912   07/09/21 2000  vancomycin (VANCOREADY) IVPB 1500 mg/300 mL        1,500 mg 150 mL/hr over 120 Minutes Intravenous  Once 07/09/21 1956 07/10/21 0043   07/09/21 1845  vancomycin (VANCOCIN) IVPB 1000 mg/200 mL premix  Status:  Discontinued        1,000 mg 200 mL/hr over 60 Minutes Intravenous  Once 07/09/21 1843 07/09/21 2121       Objective: Vitals:   07/10/21 0904 07/10/21 1051  BP: (!) 146/68 (!) 151/86  Pulse: 77   Resp: 20 (!) 23  Temp: 98.1 F (36.7 C) 98.5 F (36.9 C)  SpO2:  99%   No intake or output data in the 24 hours ending 07/10/21 1259 There were no vitals filed for this visit. Weight change:  There is no height or weight on file to calculate BMI.   Physical Exam: General exam: Young male.  In mild to moderate  distress because of pain Skin: No rashes, lesions or ulcers. HEENT: Atraumatic, normocephalic, no obvious bleeding Lungs: Clear to auscultate bilaterally CVS: Regular rate and rhythm, no murmur GI/Abd soft, nontender, nondistended, bowel sound present CNS: Alert, awake, oriented x3 Psychiatry: Mood appropriate Extremities: No pedal edema, no calf tenderness, right arm significantly swollen with draining wound.  Data Review: I have personally reviewed the laboratory data and studies available.  F/u labs ordered Unresulted Labs (From admission, onward)  Start     Ordered   07/11/21 0500  CBC with Differential/Platelet  Daily,   R     Question:  Specimen collection method  Answer:  Lab=Lab collect   07/10/21 0834   07/11/21 0500  Basic metabolic panel  Daily,   R     Question:  Specimen collection method  Answer:  Lab=Lab collect   07/10/21 0834   07/10/21 0955  Basic metabolic panel  ONCE - STAT,   STAT       Question:  Specimen collection method  Answer:  Lab=Lab collect   07/10/21 0954   07/10/21 0804  Surgical pcr screen  ONCE - STAT,   STAT        07/10/21 0804   07/09/21 2201  Protime-INR  Once,   R       Question:  Specimen collection method  Answer:  Lab=Lab collect   07/09/21 2200   07/09/21 2154  MRSA Next Gen by PCR, Nasal  Once,   R        07/09/21 2154   07/09/21 2141  Differential  Add-on,   AD       Question Answer Comment  Specimen collection method Lab=Lab collect   Release to patient Immediate      07/09/21 2140   07/09/21 2141  Osmolality, urine  Once,   R       Question:  Release to patient  Answer:  Immediate   07/09/21 2140   07/09/21 2140  Vitamin B12  (Anemia Panel (PNL))  Once,   R       Question:  Specimen collection method  Answer:  Lab=Lab collect   07/09/21 2139   07/09/21 2140  Iron and TIBC  (Anemia Panel (PNL))  Once,   R       Question:  Specimen collection method  Answer:  Lab=Lab collect   07/09/21 2139   07/09/21 2140  Ferritin   (Anemia Panel (PNL))  Once,   R       Question:  Specimen collection method  Answer:  Lab=Lab collect   07/09/21 2139   07/09/21 1452  Blood culture (routine x 2)  BLOOD CULTURE X 2,   STAT      07/09/21 1451            Signed, Lorin Glass, MD [facility] 07/10/2021

## 2021-07-10 NOTE — Consult Note (Signed)
Reason for Consult:RUE infection Referring Physician: Toy Baker Time called: 0730 Time at bedside: 0908   Justin Fisher is an 36 y.o. male.  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 About 2 weeks ago he began to have some pain in his upper arm. Then it began to swell dramatically and about a week ago things got much worse. 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  Past Medical History:  Diagnosis Date   Anxiety    Suicidal thoughts     No past surgical history on file.  No family history on file.  Social History:  reports that he has been smoking. He has been smoking an average of .5 packs per day. He has never used smokeless tobacco. He reports current alcohol use. He reports current drug use. Drug: IV.  Allergies: No Known Allergies  Medications: I have reviewed the patient's current medications.  Results for orders placed or performed during the hospital encounter of 07/09/21 (from the past 48 hour(s))  Resp Panel by RT-PCR (Flu A&B, Covid)     Status: None   Collection Time: 07/09/21  6:33 PM   Specimen: Nasopharyngeal(NP) swabs in vial transport medium  Result Value Ref Range   SARS Coronavirus 2 by RT PCR NEGATIVE NEGATIVE    Comment: (NOTE) SARS-CoV-2 target nucleic acids are NOT DETECTED.  The SARS-CoV-2 RNA is generally detectable in upper respiratory specimens during the acute phase of infection. The lowest concentration of SARS-CoV-2 viral copies this assay can detect is 138 copies/mL. A negative result does not preclude SARS-Cov-2 infection and should not be used as the sole basis for treatment or other patient management decisions. A negative result may occur with  improper specimen collection/handling, submission of specimen other than nasopharyngeal swab, presence of viral mutation(s) within the areas targeted by this assay, and inadequate number of viral copies(<138 copies/mL). A negative result must be combined  with clinical observations, patient history, and epidemiological information. The expected result is Negative.  Fact Sheet for Patients:  EntrepreneurPulse.com.au  Fact Sheet for Healthcare Providers:  IncredibleEmployment.be  This test is no t yet approved or cleared by the [place] FDA and  has been authorized for detection and/or diagnosis of SARS-CoV-2 by FDA under an Emergency Use Authorization (EUA). This EUA will remain  in effect (meaning this test can be used) for the duration of the COVID-19 declaration under Section 564(b)(1) of the Act, 21 U.S.C.section 360bbb-3(b)(1), unless the authorization is terminated  or revoked sooner.       Influenza A by PCR NEGATIVE NEGATIVE   Influenza B by PCR NEGATIVE NEGATIVE    Comment: (NOTE) The Xpert Xpress SARS-CoV-2/FLU/RSV plus assay is intended as an aid in the diagnosis of influenza from Nasopharyngeal swab specimens and should not be used as a sole basis for treatment. Nasal washings and aspirates are unacceptable for Xpert Xpress SARS-CoV-2/FLU/RSV testing.  Fact Sheet for Patients: EntrepreneurPulse.com.au  Fact Sheet for Healthcare Providers: IncredibleEmployment.be  This test is not yet approved or cleared by the [place] FDA and has been authorized for detection and/or diagnosis of SARS-CoV-2 by FDA under an Emergency Use Authorization (EUA). This EUA will remain in effect (meaning this test can be used) for the duration of the COVID-19 declaration under Section 564(b)(1) of the Act, 21 U.S.C. section 360bbb-3(b)(1), unless the authorization is terminated or revoked.  Performed at [facility], [place] [address], [place], [place] [address]   CBC with  Differential     Status: Abnormal   Collection Time: 07/09/21  7:00 PM  Result Value Ref Range   WBC 21.1 (H) 4.0 - 10.5 K/uL   RBC 3.42 (L) 4.22 - 5.81 MIL/uL   Hemoglobin 7.5  (L) 13.0 - 17.0 g/dL    Comment: Reticulocyte Hemoglobin testing may be clinically indicated, consider ordering this additional test [id number]    HCT 24.2 (L) 39.0 - 52.0 %   MCV 70.8 (L) 80.0 - 100.0 fL   MCH 21.9 (L) 26.0 - 34.0 pg   MCHC 31.0 30.0 - 36.0 g/dL   RDW 17.6 (H) 11.5 - 15.5 %   Platelets 600 (H) 150 - 400 K/uL   nRBC 0.0 0.0 - 0.2 %   Neutrophils Relative % 87 %   Neutro Abs 18.4 (H) 1.7 - 7.7 K/uL   Lymphocytes Relative 9 %   Lymphs Abs 1.9 0.7 - 4.0 K/uL   Monocytes Relative 4 %   Monocytes Absolute 0.8 0.1 - 1.0 K/uL   Eosinophils Relative 0 %   Eosinophils Absolute 0.0 0.0 - 0.5 K/uL   Basophils Relative 0 %   Basophils Absolute 0.0 0.0 - 0.1 K/uL   WBC Morphology See Note     Comment: Increased Bands. >20% Bands   nRBC 0 0 /100 WBC   Abs Immature Granulocytes 0.00 0.00 - 0.07 K/uL    Comment: Performed at [facility], [place] [address], [place], [place] [address]  Comprehensive metabolic panel     Status: Abnormal   Collection Time: 07/09/21  7:00 PM  Result Value Ref Range   Sodium 131 (L) 135 - 145 mmol/L   Potassium 2.4 (LL) 3.5 - 5.1 mmol/L    Comment: CRITICAL RESULT CALLED TO, READ BACK BY AND VERIFIED WITH: Leana Roe 2041 07/09/2021 WBOND    Chloride 100 98 - 111 mmol/L   CO2 21 (L) 22 - 32 mmol/L   Glucose, Bld 114 (H) 70 - 99 mg/dL    Comment: Glucose reference range applies only to samples taken after fasting for at least 8 hours.   BUN 16 6 - 20 mg/dL   Creatinine, Ser 0.64 0.61 - 1.24 mg/dL   Calcium 8.2 (L) 8.9 - 10.3 mg/dL   Total Protein 6.4 (L) 6.5 - 8.1 g/dL   Albumin 2.0 (L) 3.5 - 5.0 g/dL   AST 21 15 - 41 U/L   ALT 18 0 - 44 U/L   Alkaline Phosphatase 81 38 - 126 U/L   Total Bilirubin 0.6 0.3 - 1.2 mg/dL   GFR, Estimated >60 >60 mL/min    Comment: (NOTE) Calculated using the CKD-EPI Creatinine Equation (2021)    Anion gap 10 5 - 15    Comment: Performed at [facility], [place] [address], [place], [place] [address]   CK     Status: Abnormal   Collection Time: 07/09/21  7:00 PM  Result Value Ref Range   Total CK 15 (L) 49 - 397 U/L    Comment: Performed at [facility], [place] [address] [place], [place] [address]  Hepatic function panel     Status: Abnormal   Collection Time: 07/09/21  7:00 PM  Result Value Ref Range   Total Protein 6.6 6.5 - 8.1 g/dL   Albumin 1.9 (L) 3.5 - 5.0 g/dL   AST 20 15 - 41 U/L   ALT 16 0 - 44 U/L   Alkaline Phosphatase 91 38 - 126 U/L  Total Bilirubin 0.8 0.3 - 1.2 mg/dL   Bilirubin, Direct 0.2 0.0 - 0.2 mg/dL   Indirect Bilirubin 0.6 0.3 - 0.9 mg/dL    Comment: Performed at [facility], [place] [address] [place], [place] [address]  Magnesium     Status: None   Collection Time: 07/09/21  7:00 PM  Result Value Ref Range   Magnesium 1.9 1.7 - 2.4 mg/dL    Comment: Performed at [facility], [place] [address], [place], [place] [address]  Procalcitonin     Status: None   Collection Time: 07/09/21  7:00 PM  Result Value Ref Range   Procalcitonin 1.06 ng/mL    Comment:        Interpretation: PCT > 0.5 ng/mL and <= 2 ng/mL: Systemic infection (sepsis) is possible, but other conditions are known to elevate PCT as well. (NOTE)       Sepsis PCT Algorithm           Lower Respiratory Tract                                      Infection PCT Algorithm    ----------------------------     ----------------------------         PCT < 0.25 ng/mL                PCT < 0.10 ng/mL          Strongly encourage             Strongly discourage   discontinuation of antibiotics    initiation of antibiotics    ----------------------------     -----------------------------       PCT 0.25 - 0.50 ng/mL            PCT 0.10 - 0.25 ng/mL               OR       >80% decrease in PCT            Discourage initiation of                                            antibiotics      Encourage discontinuation           of antibiotics    ----------------------------      -----------------------------         PCT >= 0.50 ng/mL              PCT 0.26 - 0.50 ng/mL                AND       <80% decrease in PCT             Encourage initiation of                                             antibiotics       Encourage continuation           of antibiotics    ----------------------------     -----------------------------        PCT >= 0.50 ng/mL  PCT > 0.50 ng/mL               AND         increase in PCT                  Strongly encourage                                      initiation of antibiotics    Strongly encourage escalation           of antibiotics                                     -----------------------------                                           PCT <= 0.25 ng/mL                                                 OR                                        > 80% decrease in PCT                                      Discontinue / Do not initiate                                             antibiotics  Performed at [facility], [address], [place], [place] [address]   Phosphorus     Status: None   Collection Time: 07/09/21  7:00 PM  Result Value Ref Range   Phosphorus 4.6 2.5 - 4.6 mg/dL    Comment: Performed at [facility] [address], [place], [place] [address]  Reticulocytes     Status: Abnormal   Collection Time: 07/09/21  7:00 PM  Result Value Ref Range   Retic Ct Pct 0.5 0.4 - 3.1 %   RBC. 3.43 (L) 4.22 - 5.81 MIL/uL   Retic Count, Absolute 15.8 (L) 19.0 - 186.0 K/uL   Immature Retic Fract 24.1 (H) 2.3 - 15.9 %    Comment: Performed at [facility] [address] [place], [place] [address]  ABO/Rh     Status: None   Collection Time: 07/09/21  7:00 PM  Result Value Ref Range   ABO/RH(D)      B POS Performed at [facility] [address], [place], [place] [address]   Folate     Status: None   Collection Time: 07/09/21 10:00 PM  Result Value Ref Range   Folate 10.8 >5.9 ng/mL     Comment: Performed at [facility] [address], [place], [place] [address]  Sodium, urine, random     Status: None   Collection Time: 07/09/21 10:00 PM  Result Value Ref Range   Sodium, Ur <10 mmol/L    Comment: Performed at [facility], [place] [address], [place], [place] [address]  TSH     Status: None   Collection Time: 07/09/21 10:00 PM  Result Value Ref Range   TSH 0.527 0.350 - 4.500 uIU/mL    Comment: Performed by a 3rd Generation assay with a functional sensitivity of <=0.01 uIU/mL. Performed at [facility], [place] [address], [place], [place] [address]   Creatinine, urine, random     Status: None   Collection Time: 07/09/21 10:00 PM  Result Value Ref Range   Creatinine, Urine 41.01 mg/dL    Comment: Performed at [facility], [place] [address], [place], [place] [address]  Urine rapid drug screen (hosp performed)     Status: Abnormal   Collection Time: 07/09/21 10:00 PM  Result Value Ref Range   Opiates NONE DETECTED NONE DETECTED   Cocaine POSITIVE (A) NONE DETECTED   Benzodiazepines NONE DETECTED NONE DETECTED   Amphetamines NONE DETECTED NONE DETECTED   Tetrahydrocannabinol NONE DETECTED NONE DETECTED   Barbiturates NONE DETECTED NONE DETECTED    Comment: (NOTE) DRUG SCREEN FOR MEDICAL PURPOSES ONLY.  IF CONFIRMATION IS NEEDED FOR ANY PURPOSE, NOTIFY LAB WITHIN 5 DAYS.  LOWEST DETECTABLE LIMITS FOR URINE DRUG SCREEN Drug Class                     Cutoff (ng/mL) Amphetamine and metabolites    1000 Barbiturate and metabolites    200 Benzodiazepine                 [id number] Tricyclics and metabolites     300 Opiates and metabolites        300 Cocaine and metabolites        300 THC                            50 Performed at [facility], [place] [address] [place], [place] [address]   Lactic acid, plasma     Status: None   Collection Time: 07/09/21 10:00 PM  Result Value Ref Range   Lactic Acid, Venous 1.2 0.5 - 1.9 mmol/L    Comment:  Performed at [facility] [address], [place], [place] [address]  Ethanol     Status: None   Collection Time: 07/09/21 10:00 PM  Result Value Ref Range   Alcohol, Ethyl (B) <10 <10 mg/dL    Comment: (NOTE) Lowest detectable limit for serum alcohol is 10 mg/dL.  For medical purposes only. Performed at [facility], [place] [address] [place], [place] [address]   Osmolality     Status: None   Collection Time: 07/09/21 10:00 PM  Result Value Ref Range   Osmolality 291 275 - 295 mOsm/kg    Comment: Performed at [facility], [place] [address], [place], [place] [address]  Cortisol-am, blood     Status: Abnormal   Collection Time: 07/10/21  2:54 AM  Result Value Ref Range   Cortisol - AM 23.8 (H) 6.7 - 22.6 ug/dL    Comment: Performed at [facility] [address] [place], [place] [address]  Prealbumin     Status: Abnormal   Collection Time: 07/10/21  2:54 AM  Result  Value Ref Range   Prealbumin 5.3 (L) 18 - 38 mg/dL    Comment: Performed at [facility], [address], [place], [place] [address]  Lipid panel     Status: Abnormal   Collection Time: 07/10/21  2:54 AM  Result Value Ref Range   Cholesterol 85 0 - 200 mg/dL   Triglycerides 929 <244 mg/dL   HDL <62 (L) >86 mg/dL   Total CHOL/HDL Ratio NOT CALCULATED RATIO    Comment: HDL <10   VLDL 22 0 - 40 mg/dL   LDL Cholesterol NOT CALCULATED 0 - 99 mg/dL    Comment: HDL <38 Performed at [facility], [address] [place], [place] [address]   Magnesium     Status: None   Collection Time: 07/10/21  2:54 AM  Result Value Ref Range   Magnesium 1.7 1.7 - 2.4 mg/dL    Comment: Performed at [facility], [address], [place], [place] [address]  Phosphorus     Status: None   Collection Time: 07/10/21  2:54 AM  Result Value Ref Range   Phosphorus 3.1 2.5 - 4.6 mg/dL    Comment: Performed at [facility], [address], [place], [place] [address]  CBC WITH DIFFERENTIAL      Status: Abnormal   Collection Time: 07/10/21  2:54 AM  Result Value Ref Range   WBC 15.7 (H) 4.0 - 10.5 K/uL   RBC 3.36 (L) 4.22 - 5.81 MIL/uL   Hemoglobin 7.2 (L) 13.0 - 17.0 g/dL    Comment: Reticulocyte Hemoglobin testing may be clinically indicated, consider ordering this additional test [id number]    HCT 24.7 (L) 39.0 - 52.0 %   MCV 73.5 (L) 80.0 - 100.0 fL   MCH 21.4 (L) 26.0 - 34.0 pg   MCHC 29.1 (L) 30.0 - 36.0 g/dL   RDW 60.6 (H) 00.4 - 59.9 %   Platelets 543 (H) 150 - 400 K/uL   nRBC 0.0 0.0 - 0.2 %   Neutrophils Relative % 76 %   Neutro Abs 12.6 (H) 1.7 - 7.7 K/uL   Band Neutrophils 4 %   Lymphocytes Relative 16 %   Lymphs Abs 2.5 0.7 - 4.0 K/uL   Monocytes Relative 4 %   Monocytes Absolute 0.6 0.1 - 1.0 K/uL   Eosinophils Relative 0 %   Eosinophils Absolute 0.0 0.0 - 0.5 K/uL   Basophils Relative 0 %   Basophils Absolute 0.0 0.0 - 0.1 K/uL   nRBC 0 0 /100 WBC   Abs Immature Granulocytes 0.00 0.00 - 0.07 K/uL    Comment: Performed at [facility], [address], [place], [place] [address]  Comprehensive metabolic panel     Status: Abnormal   Collection Time: 07/10/21  2:54 AM  Result Value Ref Range   Sodium 129 (L) 135 - 145 mmol/L   Potassium 2.7 (LL) 3.5 - 5.1 mmol/L    Comment: CRITICAL RESULT CALLED TO, READ BACK BY AND VERIFIED WITH: MAKAYLA SHARP RN 07/10/21 0402 M KOROLESKI    Chloride 103 98 - 111 mmol/L   CO2 20 (L) 22 - 32 mmol/L   Glucose, Bld 123 (H) 70 - 99 mg/dL    Comment: Glucose reference range applies only to samples taken after fasting for at least 8 hours.   BUN 15 6 - 20 mg/dL   Creatinine, Ser 2.39 0.61 - 1.24 mg/dL   Calcium 8.0 (L) 8.9 - 10.3 mg/dL  Total Protein 6.0 (L) 6.5 - 8.1 g/dL   Albumin 1.9 (L) 3.5 - 5.0 g/dL   AST 18 15 - 41 U/L   ALT 15 0 - 44 U/L   Alkaline Phosphatase 78 38 - 126 U/L   Total Bilirubin 0.5 0.3 - 1.2 mg/dL   GFR, Estimated >60 >60 mL/min    Comment: (NOTE) Calculated using the CKD-EPI  Creatinine Equation (2021)    Anion gap 6 5 - 15    Comment: Performed at [facility] [address] [place], [place] [address]  Hemoglobin A1c     Status: None   Collection Time: 07/10/21  2:54 AM  Result Value Ref Range   Hgb A1c MFr Bld 5.3 4.8 - 5.6 %    Comment: (NOTE) Pre diabetes:          5.7%-6.4%  Diabetes:              >6.4%  Glycemic control for   <7.0% adults with diabetes    Mean Plasma Glucose 105.41 mg/dL    Comment: Performed at [facility] [address] [place], [place] [address]  Type and screen [place]     Status: None (Preliminary result)   Collection Time: 07/10/21  4:36 AM  Result Value Ref Range   ABO/RH(D) B POS    Antibody Screen NEG    Sample Expiration 07/13/2021,2359    Unit Number [id number]    Blood Component Type RED CELLS,LR    Unit division 00    Status of Unit ISSUED    Transfusion Status OK TO TRANSFUSE    Crossmatch Result      Compatible Performed at [facility], [place] [address] [place], [place] [address]   Prepare RBC (crossmatch)     Status: None   Collection Time: 07/10/21  4:36 AM  Result Value Ref Range   Order Confirmation      ORDER PROCESSED BY BLOOD BANK Performed at [facility], [address], [place], [place] [address]     DG Forearm Left  Result Date: 07/09/2021 CLINICAL DATA:  Evaluate for foreign object. EXAM: LEFT FOREARM - 2 VIEW COMPARISON:  Radiograph dated 04/15/2020. FINDINGS: There is a 6 mm linear radiopaque foreign object in the soft tissues of the distal forearm lateral to the distal third of the radius as seen on the prior radiograph. An additional 2.5 cm linear radiopaque object noted in the lateral soft tissues of the mid forearm. This is only seen on the AP view. No acute fracture or dislocation. The bones are well mineralized. No significant arthritic changes. IMPRESSION: Two linear radiopaque foreign bodies as described. No acute fracture or  dislocation. Electronically Signed   By: Anner Crete M.D.   On: 07/09/2021 20:43   DG Humerus Right  Result Date: 07/09/2021 CLINICAL DATA:  Right arm pain.  IV drug abuse. EXAM: RIGHT HUMERUS - 2+ VIEW COMPARISON:  07/07/2021 FINDINGS: Extensive gas throughout the soft tissues of the upper arm on the right has progressed in the interval. Findings compatible with extensive soft tissue infection. No fracture or osteomyelitis identified. IMPRESSION: Progression of soft tissue gas throughout the right upper arm compatible with soft tissue infection. No skeletal abnormality. Electronically Signed   By: Franchot Gallo M.D.   On: 07/09/2021 18:32    Review of Systems  Constitutional:  Negative for chills, diaphoresis and fever.  HENT:  Negative for ear discharge, ear pain, hearing loss  and tinnitus.   Eyes:  Negative for photophobia and pain.  Respiratory:  Negative for cough and shortness of breath.   Cardiovascular:  Negative for chest pain.  Gastrointestinal:  Negative for abdominal pain, nausea and vomiting.  Genitourinary:  Negative for dysuria, flank pain, frequency and urgency.  Musculoskeletal:  Positive for arthralgias (Right arm). Negative for back pain, myalgias and neck pain.  Neurological:  Negative for dizziness and headaches.  Hematological:  Does not bruise/bleed easily.  Psychiatric/Behavioral:  The patient is not nervous/anxious.   Blood pressure (!) 146/68, pulse 77, temperature 98.1 F (36.7 C), temperature source Oral, resp. rate 20, SpO2 97 %. Physical Exam Constitutional:      General: He is not in acute distress.    Appearance: He is well-developed. He is not diaphoretic.  HENT:     Head: Normocephalic and atraumatic.  Eyes:     General: No scleral icterus.       Right eye: No discharge.        Left eye: No discharge.     Conjunctiva/sclera: Conjunctivae normal.  Cardiovascular:     Rate and Rhythm: Normal rate and regular rhythm.  Pulmonary:     Effort:  Pulmonary effort is normal. No respiratory distress.  Musculoskeletal:     Cervical back: Normal range of motion.     Comments: Right shoulder, elbow, wrist, digits- Punctate wound anterior upper arm with active purulent malodorous discharge, severe TTP, no instability, no blocks to motion save pain  Sens  R/M/U intact, Ax paresthetic  Mot   Ax/ R/ PIN/ M/ AIN/ U intact  Rad 2+  Skin:    General: Skin is warm and dry.  Neurological:     Mental Status: He is alert.  Psychiatric:        Mood and Affect: Mood normal.        Behavior: Behavior normal.    Assessment/Plan: RUE infection -- Awaiting CT to determine extent of infection. Will not tolerate MRI at this point so have cancelled that study. Continue NPO. Needs I&D but will need hypokalemia corrected. Anemia -- Will need workup for this    Lisette Abu, PA-C Orthopedic Surgery [phone] 07/10/2021, 9:29 AM

## 2021-07-10 NOTE — Progress Notes (Signed)
°  Echocardiogram 2D Echocardiogram has been performed.  Justin Fisher F 07/10/2021, 2:38 PM

## 2021-07-10 NOTE — Assessment & Plan Note (Signed)
-   Spoke about importance of quitting spent 5 minutes discussing options for treatment, prior attempts at quitting, and dangers of smoking  -At this point patient is  interested in quitting  - would like nicotine gum, hold off on while NPo  - nursing tobacco cessation protocol

## 2021-07-10 NOTE — Anesthesia Preprocedure Evaluation (Signed)
Anesthesia Evaluation  Patient identified by MRN, date of birth, ID band Patient awake    Reviewed: Allergy & Precautions, NPO status , Patient's Chart, lab work & pertinent test results  Airway Mallampati: II  TM Distance: >3 FB Neck ROM: Full    Dental no notable dental hx.    Pulmonary neg pulmonary ROS, Current Smoker,    Pulmonary exam normal breath sounds clear to auscultation       Cardiovascular negative cardio ROS Normal cardiovascular exam Rhythm:Regular Rate:Normal     Neuro/Psych negative neurological ROS  negative psych ROS   GI/Hepatic negative GI ROS, (+)     substance abuse  IV drug use,   Endo/Other  negative endocrine ROS  Renal/GU negative Renal ROS  negative genitourinary   Musculoskeletal negative musculoskeletal ROS (+)   Abdominal   Peds negative pediatric ROS (+)  Hematology  (+) anemia ,   Anesthesia Other Findings   Reproductive/Obstetrics negative OB ROS                             Anesthesia Physical Anesthesia Plan  ASA: 3  Anesthesia Plan: General   Post-op Pain Management: Dilaudid IV   Induction: Intravenous  PONV Risk Score and Plan: 1 and Ondansetron, Dexamethasone and Treatment may vary due to age or medical condition  Airway Management Planned: LMA  Additional Equipment:   Intra-op Plan:   Post-operative Plan: Extubation in OR  Informed Consent: I have reviewed the patients History and Physical, chart, labs and discussed the procedure including the risks, benefits and alternatives for the proposed anesthesia with the patient or authorized representative who has indicated his/her understanding and acceptance.     Dental advisory given  Plan Discussed with: CRNA and Surgeon  Anesthesia Plan Comments:         Anesthesia Quick Evaluation

## 2021-07-10 NOTE — ED Notes (Signed)
Pt refusing to go to CT after multiple attempts of staff to take pt. Messaged Chen, DO, no orders placed. Surgeon Sherilyn Dacosta, MD, not available to contact after sent secure chat message.

## 2021-07-10 NOTE — Consult Note (Addendum)
WOC consult requested for arm wounds prior to ortho service involvement.  Dr Sherilyn Dacosta indicates in progress notes; "scheduled for I&D tomorrow afternoon; 1/5." Please refer to their team for further questions. Please re-consult if further assistance is needed.  Thank-you,  Cammie Mcgee MSN, RN, CWOCN, [place], CNS [phone]

## 2021-07-10 NOTE — Anesthesia Procedure Notes (Signed)
Procedure Name: Intubation Date/Time: 07/10/2021 4:34 PM Performed by: Eligha Bridegroom, CRNA Pre-anesthesia Checklist: Patient identified, Emergency Drugs available, Suction available, Patient being monitored and Timeout performed Patient Re-evaluated:Patient Re-evaluated prior to induction Oxygen Delivery Method: Circle system utilized Preoxygenation: Pre-oxygenation with 100% oxygen Induction Type: IV induction and Rapid sequence Laryngoscope Size: Mac and 4 Grade View: Grade I Tube type: Oral Tube size: 7.5 mm Number of attempts: 1 Airway Equipment and Method: Stylet Placement Confirmation: ETT inserted through vocal cords under direct vision, positive ETCO2 and breath sounds checked- equal and bilateral Secured at: 22 cm Tube secured with: Tape Dental Injury: Teeth and Oropharynx as per pre-operative assessment

## 2021-07-11 ENCOUNTER — Encounter (HOSPITAL_COMMUNITY): Payer: Self-pay | Admitting: Orthopedic Surgery

## 2021-07-11 DIAGNOSIS — A419 Sepsis, unspecified organism: Secondary | ICD-10-CM | POA: Diagnosis not present

## 2021-07-11 DIAGNOSIS — E43 Unspecified severe protein-calorie malnutrition: Secondary | ICD-10-CM | POA: Insufficient documentation

## 2021-07-11 LAB — CBC WITH DIFFERENTIAL/PLATELET
Abs Immature Granulocytes: 0 10*3/uL (ref 0.00–0.07)
Basophils Absolute: 0.1 10*3/uL (ref 0.0–0.1)
Basophils Relative: 1 %
Eosinophils Absolute: 0.1 10*3/uL (ref 0.0–0.5)
Eosinophils Relative: 1 %
HCT: 23.7 % — ABNORMAL LOW (ref 39.0–52.0)
Hemoglobin: 7.4 g/dL — ABNORMAL LOW (ref 13.0–17.0)
Lymphocytes Relative: 30 %
Lymphs Abs: 4.4 10*3/uL — ABNORMAL HIGH (ref 0.7–4.0)
MCH: 22.8 pg — ABNORMAL LOW (ref 26.0–34.0)
MCHC: 31.2 g/dL (ref 30.0–36.0)
MCV: 72.9 fL — ABNORMAL LOW (ref 80.0–100.0)
Monocytes Absolute: 0.9 10*3/uL (ref 0.1–1.0)
Monocytes Relative: 6 %
Neutro Abs: 9.1 10*3/uL — ABNORMAL HIGH (ref 1.7–7.7)
Neutrophils Relative %: 62 %
Platelets: 548 10*3/uL — ABNORMAL HIGH (ref 150–400)
RBC: 3.25 MIL/uL — ABNORMAL LOW (ref 4.22–5.81)
RDW: 18.1 % — ABNORMAL HIGH (ref 11.5–15.5)
Smear Review: NORMAL
WBC: 14.7 10*3/uL — ABNORMAL HIGH (ref 4.0–10.5)
nRBC: 0 % (ref 0.0–0.2)

## 2021-07-11 LAB — TYPE AND SCREEN
ABO/RH(D): B POS
Antibody Screen: NEGATIVE
Unit division: 0

## 2021-07-11 LAB — AEROBIC CULTURE W GRAM STAIN (SUPERFICIAL SPECIMEN): Special Requests: NORMAL

## 2021-07-11 LAB — BASIC METABOLIC PANEL
Anion gap: 8 (ref 5–15)
BUN: 10 mg/dL (ref 6–20)
CO2: 23 mmol/L (ref 22–32)
Calcium: 7.6 mg/dL — ABNORMAL LOW (ref 8.9–10.3)
Chloride: 103 mmol/L (ref 98–111)
Creatinine, Ser: 0.62 mg/dL (ref 0.61–1.24)
GFR, Estimated: >60 mL/min (ref >60)
Glucose, Bld: 153 mg/dL — ABNORMAL HIGH (ref 70–99)
Potassium: 3.1 mmol/L — ABNORMAL LOW (ref 3.5–5.1)
Sodium: 134 mmol/L — ABNORMAL LOW (ref 135–145)

## 2021-07-11 LAB — BPAM RBC
Blood Product Expiration Date: 202301152359
ISSUE DATE / TIME: 202301050640
Unit Type and Rh: 7300

## 2021-07-11 LAB — HEPATITIS B CORE ANTIBODY, TOTAL: Hep B Core Total Ab: NONREACTIVE

## 2021-07-11 LAB — HEPATITIS C ANTIBODY: HCV Ab: REACTIVE — AB

## 2021-07-11 LAB — HEPATITIS B SURFACE ANTIGEN: Hepatitis B Surface Ag: NONREACTIVE

## 2021-07-11 LAB — HEPATITIS A ANTIBODY, IGM: Hep A IgM: NONREACTIVE

## 2021-07-11 MED ORDER — POVIDONE-IODINE 10 % EX SWAB: 2.0000 "application " | Freq: Once | CUTANEOUS | Status: DC

## 2021-07-11 MED ORDER — METOCLOPRAMIDE HCL 5 MG/ML IJ SOLN: 5.0000 mg | Freq: Three times a day (TID) | INTRAMUSCULAR | Status: DC | PRN

## 2021-07-11 MED ORDER — CHLORHEXIDINE GLUCONATE 4 % EX LIQD
60.0000 mL | Freq: Once | CUTANEOUS | Status: DC
  Filled 2021-07-11: qty 60

## 2021-07-11 MED ORDER — OXYCODONE-ACETAMINOPHEN 5-325 MG PO TABS
1.0000 | ORAL_TABLET | Freq: Four times a day (QID) | ORAL | Status: DC | PRN
  Administered 2021-07-11 – 2021-07-12 (×2): 1 via ORAL
  Filled 2021-07-11 (×2): qty 1

## 2021-07-11 MED ORDER — ONDANSETRON HCL 4 MG/2ML IJ SOLN: 4.0000 mg | Freq: Four times a day (QID) | INTRAMUSCULAR | Status: DC | PRN

## 2021-07-11 MED ORDER — MUPIROCIN 2 % EX OINT
1.0000 "application " | TOPICAL_OINTMENT | Freq: Two times a day (BID) | CUTANEOUS | Status: DC
  Administered 2021-07-11 – 2021-07-15 (×9): 1 via NASAL
  Filled 2021-07-11 (×3): qty 22

## 2021-07-11 MED ORDER — ONDANSETRON HCL 4 MG PO TABS: 4.0000 mg | ORAL_TABLET | Freq: Four times a day (QID) | ORAL | Status: DC | PRN

## 2021-07-11 MED ORDER — ENSURE ENLIVE PO LIQD
237.0000 mL | ORAL | Status: DC
  Administered 2021-07-11: 237 mL via ORAL

## 2021-07-11 MED ORDER — POTASSIUM CHLORIDE CRYS ER 20 MEQ PO TBCR
40.0000 meq | EXTENDED_RELEASE_TABLET | ORAL | Status: AC
  Administered 2021-07-11 (×2): 40 meq via ORAL
  Filled 2021-07-11 (×2): qty 2

## 2021-07-11 MED ORDER — CHLORHEXIDINE GLUCONATE CLOTH 2 % EX PADS
6.0000 | MEDICATED_PAD | Freq: Every day | CUTANEOUS | Status: AC
  Administered 2021-07-11 – 2021-07-15 (×4): 6 via TOPICAL

## 2021-07-11 MED ORDER — HYDROMORPHONE HCL 1 MG/ML IJ SOLN
1.0000 mg | INTRAMUSCULAR | Status: DC | PRN
  Administered 2021-07-11 – 2021-07-16 (×15): 1 mg via INTRAVENOUS
  Filled 2021-07-11 (×16): qty 1

## 2021-07-11 MED ORDER — METOCLOPRAMIDE HCL 5 MG PO TABS: 5.0000 mg | ORAL_TABLET | Freq: Three times a day (TID) | ORAL | Status: DC | PRN

## 2021-07-11 MED ORDER — JUVEN PO PACK
1.0000 | PACK | Freq: Two times a day (BID) | ORAL | Status: DC
  Administered 2021-07-11: 1 via ORAL
  Filled 2021-07-11: qty 1

## 2021-07-11 MED ORDER — ENSURE ENLIVE PO LIQD
237.0000 mL | Freq: Two times a day (BID) | ORAL | Status: DC
  Administered 2021-07-12 – 2021-08-04 (×31): 237 mL via ORAL
  Filled 2021-07-11: qty 237

## 2021-07-11 MED ORDER — ADULT MULTIVITAMIN W/MINERALS CH
1.0000 | ORAL_TABLET | Freq: Every day | ORAL | Status: DC
  Administered 2021-07-11 – 2021-08-05 (×24): 1 via ORAL
  Filled 2021-07-11 (×24): qty 1

## 2021-07-11 MED ORDER — DOCUSATE SODIUM 100 MG PO CAPS
100.0000 mg | ORAL_CAPSULE | Freq: Two times a day (BID) | ORAL | Status: DC
  Administered 2021-07-11: 100 mg via ORAL
  Filled 2021-07-11 (×3): qty 1

## 2021-07-11 NOTE — Progress Notes (Addendum)
Initial Nutrition Assessment  DOCUMENTATION CODES:   Non-severe (moderate) malnutrition in context of social or environmental circumstances  INTERVENTION:  - Encourage PO intake  -Ensure Enlive po BID, each supplement provides 350 kcal and 20 grams of protein  - MVI with minerals daily  - Recommend micronutrient labs: Vitamin C, Vitamin A, B6, zinc, and folate; spoke with MD who declines at this time  NUTRITION DIAGNOSIS:   Moderate Malnutrition related to social / environmental circumstances (IV drug use) as evidenced by mild fat depletion, moderate muscle depletion.  GOAL:   Patient will meet greater than or equal to 90% of their needs  MONITOR:   PO intake, Supplement acceptance, Labs, Weight trends, Skin  REASON FOR ASSESSMENT:   Consult Assessment of nutrition requirement/status  ASSESSMENT:   Pt admitted for irrigation and debridement of R arm secondary to extensive RUE infection related to IV drug abuse.  Pt reports having a good appetite and ordered lunch during visit consisting of chicken, mac and cheese, cake and pudding. He reports only eating 1 meal per day PTA for the past 4 months, stating that "he has been spending money on the wrong things" instead of food. We discussed the importance of protein and proper nutrition for wound healing.   D/t documented IV drug use, pt with poor PO intake for quite some time and need for wound healing, recommend Vitamin C, Vitamin A, B6, zinc, and folate labs to test for micronutrient deficiencies. Spoke with MD who declines at this time.  Pt states that 4 months ago he weighed 210 lbs and states that his current weight is 144 lbs. Unfortunately, per review of chart unable to confirm this. Will continue to monitor weigh trends during admission.  Medications: colace, KCl, IV abx    Labs: sodium 134, potassium 3.1, ionized calcium 1.08  RUE wound VAC output: 157m x12hours  NUTRITION - FOCUSED PHYSICAL EXAM:  Flowsheet  Row Most Recent Value  Orbital Region Moderate depletion  Upper Arm Region Mild depletion  Thoracic and Lumbar Region Mild depletion  Buccal Region Mild depletion  Temple Region Mild depletion  Clavicle Bone Region Moderate depletion  Clavicle and Acromion Bone Region Moderate depletion  Scapular Bone Region Mild depletion  Dorsal Hand Mild depletion  Patellar Region Moderate depletion  Anterior Thigh Region Moderate depletion  Posterior Calf Region Moderate depletion  Edema (RD Assessment) Moderate  [RUE]  Hair Reviewed  Eyes Reviewed  Mouth Reviewed  Skin Reviewed  Nails Reviewed       Diet Order:   Diet Order             Diet regular Room service appropriate? Yes; Fluid consistency: Thin  Diet effective now                   EDUCATION NEEDS:   Education needs have been addressed  Skin:  Skin Assessment: Skin Integrity Issues: Skin Integrity Issues:: Incisions Incisions: R arm  Last BM:  07/09/20  Height:   Ht Readings from Last 1 Encounters:  07/10/21 _0  (1.702 m)    Weight:   Wt Readings from Last 1 Encounters:  07/10/21 73.4 kg   BMI:  Body mass index is 25.34 kg/m.  Estimated Nutritional Needs:   Kcal:  2200-2400  Protein:  110-130g  Fluid:  >/=2.2L  AClayborne Dana RDN, LDN Clinical Nutrition

## 2021-07-11 NOTE — Anesthesia Postprocedure Evaluation (Signed)
Anesthesia Post Note  Patient: Justin Fisher  Procedure(s) Performed: IRRIGATION AND DEBRIDEMENT RIGHT UPPER ARM (Right: Arm Upper) APPLICATION OF WOUND VAC (Right: Arm Upper)     Patient location during evaluation: PACU Anesthesia Type: General Level of consciousness: awake and alert Pain management: pain level controlled Vital Signs Assessment: post-procedure vital signs reviewed and stable Respiratory status: spontaneous breathing, nonlabored ventilation, respiratory function stable and patient connected to nasal cannula oxygen Cardiovascular status: blood pressure returned to baseline and stable Postop Assessment: no apparent nausea or vomiting Anesthetic complications: no   No notable events documented.  Last Vitals:  Vitals:   07/10/21 2308 07/11/21 0047  BP: 131/74 129/80  Pulse: 78 68  Resp: (!) 22 19  Temp: (!) 36.2 C 36.8 C  SpO2:  100%    Last Pain:  Vitals:   07/11/21 0047  TempSrc: Oral  PainSc:                  Effie Berkshire

## 2021-07-11 NOTE — Progress Notes (Signed)
PROGRESS NOTE  Justin Mcardleavid Cardamone  DOB: 04/29/1986  PCP: Patient, No Pcp Per (Inactive) RUE:[id number]  DOA: 07/09/2021  LOS: 2 days  Hospital Day: 3  Chief Complaint  Patient presents with   Arm Injury    Right arm    Brief narrative: Justin Fisher is a 36 y.o. male with PMH significant for IV drug abuse, hyperlipidemia, anxiety disorder, suicidal ideation. Patient presented to the ED on 07/09/2021 with complaint of severe pain and swelling of right arm.  Patient short heroin in the right arm 5 days ago after which it progressively became swollen, red, tender and started wheezing brown foul-smelling liquid. He was seen in the ED and was along on 1/2. Right arm x-ray at that time showed large amount of soft tissue gas concerning for cellulitis.[name].  He left AMA, continued to have worsening symptoms and hence returned back to ED.  In the ED, he had a temperature of 999.2, tachypneic, Labs with sodium low at 131, potassium low at 2.4, albumin low at 2 WBC count elevated to 21.1, hemoglobin 7.5, MCV 21, platelets 600 UDS positive for cocaine Right humerus x-ray showed progression of soft tissue gas throughout the right upper arm. Left forearm x-ray showed 2 linear radiopaque foreign bodies. Started on broad-spectrum antibiotics.   CT right humerus was obtained which showed extensive soft tissue swelling and air within the predominantly posterior and lateral aspect of right upper arm.  There is also cortical erosion within the humeral head greater tuberosity concerning for acute to subacute osteomyelitis.  There is also a rim-enhancing collection in the posterior to the mid to lateral aspect of the scapula suspicious for abscess.  Admitted to hospitalist service. Orthopedics consulted.  Subjective: Patient was seen and examined this morning. Propped up in bed.  Complains of inadequate pain control.  Right arm.  Wound dressed wound VAC.  Assessment/Plan: Sepsis secondary to right arm  cellulitis/necrotizing fasciitis/abscess -1/5, patient underwent debridement of extensive right arm wound involving skin, subcutaneous tissue, muscle fascia and bone.  Wound VAC applied. -Blood culture, wound culture pending. -Currently on broad-spectrum antibiotic coverage with IV Zosyn and IV vancomycin.  ID consulted. -Continue maintenance IV hydration. -WBC count improving.  Continue to monitor. Recent Labs  Lab 07/09/21 1900 07/09/21 2200 07/10/21 0254 07/11/21 0224  WBC 21.1*  --  15.7* 14.7*  LATICACIDVEN  --  1.2  --   --   PROCALCITON 1.06  --   --   --    IV drug use -Uses IV heroin, cocaine -Monitor for withdrawal symptoms.  Reports inadequate pain control.  Given extensive size of wound and requirement of deep debridement, I switched him to IV Dilaudid as needed.  I have made it clear to him that there is a short-term plan and need to be assessed every day for tapering  -Counseled to quit drugs.  Microcytic anemia -Unclear how acute it is.  Last labs from [facility] from 2016 with hemoglobin of 14. -Pending anemia panel. Recent Labs    07/09/21 1900 07/09/21 2200 07/10/21 0254 07/10/21 1559 07/11/21 0224  HGB 7.5*  --  7.2* 10.2* 7.4*  MCV 70.8*  --  73.5*  --  72.9*  FOLATE  --  10.8  --   --   --   RETICCTPCT 0.5  --   --   --   --    Hyponatremia -Sodium low slightly low between 130 and 134. Recent Labs  Lab 07/09/21 1900 07/10/21 0254 07/10/21 1559 07/11/21 0224  NA  131* 129* 135 134*   Hypokalemia -Potassium continues to remain low.  Further repletion ordered today. Recent Labs  Lab 07/09/21 1900 07/10/21 0254 07/10/21 1559 07/11/21 0224  K 2.4* 2.7* 3.4* 3.1*  MG 1.9 1.7  --   --   PHOS 4.6 3.1  --   --    Mobility: Encourage ambulation Living condition:  Goals of care:   Code Status: Full Code  Nutritional status: Body mass index is 25.34 kg/m.      Diet:  Diet Order             Diet regular Room service appropriate? Yes; Fluid  consistency: Thin  Diet effective now                  DVT prophylaxis:  SCDs Start: 07/11/21 0832 SCDs Start: 07/09/21 2310   Antimicrobials: Broad-spectrum antibiotics Fluid: NS at 75 mill per hour Consultants: Orthopedics Family Communication: None at bedside  Status is: Inpatient  Continue in-hospital care because: POD1, on IV antibiotics. Level of care: Progressive   Dispo: The patient is from: Home              Anticipated d/c is to: Likely home versus SNF for long-term antibiotics.  Pending clinical course              Patient currently is not medically stable to d/c.   Difficult to place patient No     Infusions:   sodium chloride 75 mL/hr at 07/11/21 0837   clindamycin (CLEOCIN) IV 900 mg (07/11/21 0540)   piperacillin-tazobactam (ZOSYN)  IV 3.375 g (07/11/21 0953)   vancomycin 1,250 mg (07/11/21 1119)    Scheduled Meds:  Chlorhexidine Gluconate Cloth  6 each Topical Q0600   docusate sodium  100 mg Oral BID   feeding supplement  237 mL Oral Q24H   multivitamin with minerals  1 tablet Oral Daily   mupirocin ointment  1 application Nasal BID   nutrition supplement (JUVEN)  1 packet Oral BID BM    PRN meds: acetaminophen **OR** acetaminophen, albuterol, HYDROmorphone (DILAUDID) injection, metoCLOPramide **OR** metoCLOPramide (REGLAN) injection, ondansetron **OR** ondansetron (ZOFRAN) IV, oxyCODONE-acetaminophen   Antimicrobials: Anti-infectives (From admission, onward)    Start     Dose/Rate Route Frequency Ordered Stop   07/11/21 0600  ceFAZolin (ANCEF) IVPB 2g/100 mL premix  Status:  Discontinued        2 g 200 mL/hr over 30 Minutes Intravenous On call to O.R. 07/10/21 1347 07/10/21 1352   07/10/21 1445  ceFAZolin (ANCEF) IVPB 2g/100 mL premix        2 g 200 mL/hr over 30 Minutes Intravenous On call to O.R. 07/10/21 1352 07/10/21 1636   07/10/21 1300  clindamycin (CLEOCIN) IVPB 900 mg        900 mg 100 mL/hr over 30 Minutes Intravenous Every 8  hours 07/10/21 0018     07/10/21 1000  vancomycin (VANCOREADY) IVPB 1250 mg/250 mL        1,250 mg 166.7 mL/hr over 90 Minutes Intravenous Every 12 hours 07/09/21 2121     07/10/21 1000  piperacillin-tazobactam (ZOSYN) IVPB 3.375 g        3.375 g 12.5 mL/hr over 240 Minutes Intravenous Every 8 hours 07/10/21 0912     07/09/21 2200  piperacillin-tazobactam (ZOSYN) IVPB 3.375 g  Status:  Discontinued        3.375 g 12.5 mL/hr over 240 Minutes Intravenous Every 8 hours 07/09/21 1958 07/10/21 0912   07/09/21 2000  vancomycin (VANCOREADY) IVPB 1500 mg/300 mL        1,500 mg 150 mL/hr over 120 Minutes Intravenous  Once 07/09/21 1956 07/10/21 0043   07/09/21 1845  vancomycin (VANCOCIN) IVPB 1000 mg/200 mL premix  Status:  Discontinued        1,000 mg 200 mL/hr over 60 Minutes Intravenous  Once 07/09/21 1843 07/09/21 2121       Objective: Vitals:   07/11/21 0502 07/11/21 0740  BP: (!) 146/85 (!) 148/87  Pulse: 66 66  Resp: 20 20  Temp: 98.4 F (36.9 C)   SpO2: 100%     Intake/Output Summary (Last 24 hours) at 07/11/2021 1308 Last data filed at 07/11/2021 0527 Gross per 24 hour  Intake 1000 ml  Output 1425 ml  Net -425 ml   Filed Weights   07/10/21 2308  Weight: 73.4 kg   Weight change:  Body mass index is 25.34 kg/m.   Physical Exam: General exam: Young male.  Mild to moderate distress because of pain at the surgical site Skin: No rashes, lesions or ulcers. HEENT: Atraumatic, normocephalic, no obvious bleeding Lungs: Clear to auscultation bilaterally CVS: Regular rate and rhythm, no murmur GI/Abd soft, nontender, nondistended, bowel sound present CNS: Alert, awake, oriented x3 Psychiatry: Mood appropriate Extremities: No pedal edema, no calf tenderness, right upper extremity swollen throughout, wound VAC over the debrided wound on right arm.  Data Review: I have personally reviewed the laboratory data and studies available.  F/u labs ordered Unresulted Labs (From  admission, onward)     Start     Ordered   07/11/21 0500  CBC with Differential/Platelet  Daily,   R     Question:  Specimen collection method  Answer:  Lab=Lab collect   07/10/21 0834   07/11/21 0500  Basic metabolic panel  Daily,   R     Question:  Specimen collection method  Answer:  Lab=Lab collect   07/10/21 0834   07/10/21 1745  Fungus Culture With Stain  RELEASE UPON ORDERING,   TIMED       Comments: Specimen B: Pre-op diagnosis: Cellulitis of Right Arm    07/10/21 1745   07/10/21 1735  Fungus Culture With Stain  RELEASE UPON ORDERING,   TIMED       Comments: Specimen A: Phone [phone] Immunocompromised?  UNKNOWN Antibiotic Treatment:  Ancef, clindamycin, TXA, Vancomycin, Zosyn Is the patient on airborne/droplet precautions? No Clinical History:  RIGHT ARM CELLULITIS Special Instructions:  none Specimen Disposition:  Microbiology     07/10/21 1735   07/10/21 0955  Basic metabolic panel  ONCE - STAT,   STAT       Question:  Specimen collection method  Answer:  Lab=Lab collect   07/10/21 0954   07/09/21 2201  Protime-INR  Once,   R       Question:  Specimen collection method  Answer:  Lab=Lab collect   07/09/21 2200   07/09/21 2154  MRSA Next Gen by PCR, Nasal  Once,   R        07/09/21 2154   07/09/21 2141  Differential  Add-on,   AD       Question Answer Comment  Specimen collection method Lab=Lab collect   Release to patient Immediate      07/09/21 2140   07/09/21 2141  Osmolality, urine  Once,   R       Question:  Release to patient  Answer:  Immediate   07/09/21 2140   07/09/21 2140  Vitamin B12  (Anemia Panel (PNL))  Once,   R       Question:  Specimen collection method  Answer:  Lab=Lab collect   07/09/21 2139   07/09/21 2140  Iron and TIBC  (Anemia Panel (PNL))  Once,   R       Question:  Specimen collection method  Answer:  Lab=Lab collect   07/09/21 2139   07/09/21 2140  Ferritin  (Anemia Panel (PNL))  Once,   R       Question:  Specimen collection  method  Answer:  Lab=Lab collect   07/09/21 2139            Signed, Lorin GlassBinaya Elgar Scoggins, MD [facility] 07/11/2021

## 2021-07-11 NOTE — Progress Notes (Signed)
Orthopaedics Daily Progress Note   07/11/2021   8:20 AM  Justin Fisher is a 36 y.o. male 1 Day Post-Op s/p IRRIGATION AND DEBRIDEMENT RIGHT UPPER ARM, APPLICATION OF WOUND VAC  Subjective Expected difficulty with pain control in the setting of chronic IV drug abuse.  Denies nausea, vomiting, or fevers.   Objective Vitals:   07/11/21 0047 07/11/21 0502  BP: 129/80 (!) 146/85  Pulse: 68 66  Resp: 19 20  Temp: 98.3 F (36.8 C) 98.4 F (36.9 C)  SpO2: 100% 100%    Intake/Output Summary (Last 24 hours) at 07/11/2021 0820 Last data filed at 07/11/2021 7253 Gross per 24 hour  Intake 1000 ml  Output 1425 ml  Net -425 ml   CT R forearm (07/10/2021) w/ w/o contrast did not demonstrate further abscesses in the forearm.  Physical Exam RUE: VAC intact, holding suction +EDC/FDP/APB/DIO SILT M/U/R +Radial and WWP distally  Assessment 36 y.o. male s/p Procedure(s) (LRB): IRRIGATION AND DEBRIDEMENT RIGHT UPPER ARM (Right) APPLICATION OF WOUND VAC (Right)  Plan Mobility: Out of bed as tolerated; elevate right upper extremity higher than heart Pain control: Continue to wean/titrate to appropriate oral regimen DVT Prophylaxis: Per primary team Further surgical plans: May require subsequent debridements and ultimately Plastics for coverage of right arm wound.  Plastics consulted 07/11/2021.   RUE: Nonweightbearing, limited mobility due to wound vacs LUE: No restrictions RLE: No restrictions LLE: No restrictions Disposition: Pending further surgical treatment Dressing care:  Maintain wound vacs at 125 mmHg low continuous suction; elevate right upper extremity higher than heart   Ernestina Columbia M.D. Orthopaedic Surgery [facility]

## 2021-07-11 NOTE — Progress Notes (Signed)
Mobility Specialist Progress Note   07/11/21 1620  Mobility  Activity Ambulated in hall;Ambulated to bathroom  Level of Assistance Standby assist, set-up cues, supervision of patient - no hands on  Assistive Device None  Distance Ambulated (ft) 345 ft  Mobility Ambulated with assistance in hallway  Mobility Response Tolerated well  Mobility performed by Mobility specialist  $Mobility charge 1 Mobility   Received pt standing in room needing to use BR. Successful BM. before session Pt c/o of 3/10 pain in RUE, ambulates well but pain increased to 7-8/10 towards the end, describing it as a "burning" sensation. Returned BTB w/ all needs met, call bell in reach and RN notified.   Holland Falling Mobility Specialist Phone Number [phone]

## 2021-07-11 NOTE — Consult Note (Signed)
[facility] for Infectious [facility]    Date of Admission:  07/09/2021   Total days of inpatient antibiotics 2        Reason for Consult: Necrotizing fascitis of right arm    Principal Problem:   Sepsis ([place]) Active Problems:   Heroin use disorder, moderate, dependence (HCC)   Anemia   Cellulitis   Hypokalemia   Hyponatremia   Tobacco abuse   Assessment: 36YM with IVDA admitted for right arm wound. He had presented with oozing brown liquid. CT showed right arm showed abscesses and osteomyelitis of greater tuberosity. Taken to OR on 1/5 where bone, muscle and fascia were debrided.   # Right arm necrotizing fascitis SP I&D and wound vac placement on 1/5 with Cx #Acute/subacute Osteomyelitis of right humerus #IVDA with heroin/fentanyl/meth -He reports that he uses tap water to mix drugs prior to injection and using amoxicillin prior to hospitalization as such continue to cover broadly with antibiotics. Reports he has shared needles in the past. Gram stain from OR Cx showing GNR, GPR and GPC in pairs.  Recommendations:  -Continue Vancomycin and pip-tazo(monitor renal function ->change to daptomycin+ pip-tazo if there are signs of nephrotoxicity) -Continue Clindamycin for anti-toxin coverage for 72 hours total -Not a home IV antibiotic candidate. He is interested in suboxone. Anticipate  6 weeks of antibiotics given osteomyelitis.  -Follow OR Cx -Follow Blood Cx  #ID labs in the setting of high risk behavior -HIV negative on 07/10/21 -Obtain Hep panel, RPR  Microbiology:   Antibiotics: Vancomycin 1/4-p Pip-tazo 1/4-p Clindamycin 1/5-p  Cultures: Blood 1/4 NGTD Other 1/5 OR Cx: pending, GS: +GPC in pairs, GPR, GNR  HPI: Justin Fisher is a 36 y.o. male with IVDA with heroin  heroin/fentanyl/meth admitted for right arm infection. Pt presented with severe pain and reported injecting heroin 5 days prior to admission. He had gone to [facility] long ED but left due to  wait time. He had taken amoxicillin(had it at home) in th interim without improvement. He had brown oozing and foul smelling liquid coming out of his arm. On arrival, pt was afebrile with wbc 21K. CT  of right humerus showed concern for acute/subacute osteomyelitis of the greater tuberosity, abscess of the scapula. CT forearm showed possible abscess in distal humerus. He was started on btoad spectrum antibiotics Taken to the OR for right arm debridement, found to have extensive soft tissue necrosis of fat and fascia extending throughout posterior compartment of arm down to olecranon bursa with extensive pus and necrosis throughout. Bone was noted to be involved as well. ID was engaged.   Review of Systems: Review of Systems  All other systems reviewed and are negative.  Past Medical History:  Diagnosis Date   Anxiety    Suicidal thoughts     Social History   Tobacco Use   Smoking status: Every Day    Packs/day: 0.50    Types: Cigarettes   Smokeless tobacco: Never  Substance Use Topics   Alcohol use: Yes   Drug use: Yes    Types: IV    Comment: Uses heroin daily as well as xanax, last use 2-3 mths    History reviewed. No pertinent family history. Scheduled Meds:  Chlorhexidine Gluconate Cloth  6 each Topical Q0600   docusate sodium  100 mg Oral BID   feeding supplement  237 mL Oral Q24H   multivitamin with minerals  1 tablet Oral Daily   mupirocin ointment  1  application Nasal BID   nutrition supplement (JUVEN)  1 packet Oral BID BM   Continuous Infusions:  sodium chloride 75 mL/hr at 07/11/21 0837   clindamycin (CLEOCIN) IV 900 mg (07/11/21 0540)   piperacillin-tazobactam (ZOSYN)  IV 3.375 g (07/11/21 0953)   vancomycin 1,250 mg (07/11/21 1119)   PRN Meds:.acetaminophen **OR** acetaminophen, albuterol, HYDROmorphone (DILAUDID) injection, metoCLOPramide **OR** metoCLOPramide (REGLAN) injection, ondansetron **OR** ondansetron (ZOFRAN) IV, oxyCODONE-acetaminophen No Known  Allergies  OBJECTIVE: Blood pressure (!) 148/87, pulse 66, temperature 98.4 F (36.9 C), temperature source Axillary, resp. rate 20, height 5\' 7"  (1.702 m), weight 73.4 kg, SpO2 100 %.  Physical Exam Constitutional:      General: He is not in acute distress.    Appearance: He is normal weight. He is not toxic-appearing.  HENT:     Head: Normocephalic and atraumatic.     Right Ear: External ear normal.     Left Ear: External ear normal.     Nose: No congestion or rhinorrhea.     Mouth/Throat:     Mouth: Mucous membranes are moist.     Pharynx: Oropharynx is clear.  Eyes:     Extraocular Movements: Extraocular movements intact.     Conjunctiva/sclera: Conjunctivae normal.     Pupils: Pupils are equal, round, and reactive to light.  Cardiovascular:     Rate and Rhythm: Normal rate and regular rhythm.     Heart sounds: No murmur heard.   No friction rub. No gallop.  Pulmonary:     Effort: Pulmonary effort is normal.     Breath sounds: Normal breath sounds.  Abdominal:     General: Abdomen is flat. Bowel sounds are normal.     Palpations: Abdomen is soft.  Musculoskeletal:        General: No swelling. Normal range of motion.     Cervical back: Normal range of motion and neck supple.     Comments: Right arm wound vac in place. Swelling present down to fingers.   Skin:    General: Skin is warm and dry.  Neurological:     General: No focal deficit present.     Mental Status: He is oriented to person, place, and time.  Psychiatric:        Mood and Affect: Mood normal.    Lab Results Lab Results  Component Value Date   WBC 14.7 (H) 07/11/2021   HGB 7.4 (L) 07/11/2021   HCT 23.7 (L) 07/11/2021   MCV 72.9 (L) 07/11/2021   PLT 548 (H) 07/11/2021    Lab Results  Component Value Date   CREATININE 0.62 07/11/2021   BUN 10 07/11/2021   NA 134 (L) 07/11/2021   K 3.1 (L) 07/11/2021   CL 103 07/11/2021   CO2 23 07/11/2021    Lab Results  Component Value Date   ALT 15  07/10/2021   AST 18 07/10/2021   ALKPHOS 78 07/10/2021   BILITOT 0.5 07/10/2021       Laurice Record, [facility] 07/11/2021, 1:04 PM

## 2021-07-12 DIAGNOSIS — E871 Hypo-osmolality and hyponatremia: Secondary | ICD-10-CM | POA: Diagnosis not present

## 2021-07-12 DIAGNOSIS — L02413 Cutaneous abscess of right upper limb: Secondary | ICD-10-CM | POA: Diagnosis not present

## 2021-07-12 DIAGNOSIS — E876 Hypokalemia: Secondary | ICD-10-CM | POA: Diagnosis not present

## 2021-07-12 DIAGNOSIS — A419 Sepsis, unspecified organism: Secondary | ICD-10-CM | POA: Diagnosis not present

## 2021-07-12 LAB — CBC WITH DIFFERENTIAL/PLATELET
Abs Immature Granulocytes: 0.88 10*3/uL — ABNORMAL HIGH (ref 0.00–0.07)
Basophils Absolute: 0.1 10*3/uL (ref 0.0–0.1)
Basophils Relative: 0 %
Eosinophils Absolute: 0.2 10*3/uL (ref 0.0–0.5)
Eosinophils Relative: 1 %
HCT: 24.1 % — ABNORMAL LOW (ref 39.0–52.0)
Hemoglobin: 7.5 g/dL — ABNORMAL LOW (ref 13.0–17.0)
Immature Granulocytes: 6 %
Lymphocytes Relative: 42 %
Lymphs Abs: 6.2 10*3/uL — ABNORMAL HIGH (ref 0.7–4.0)
MCH: 22.9 pg — ABNORMAL LOW (ref 26.0–34.0)
MCHC: 31.1 g/dL (ref 30.0–36.0)
MCV: 73.7 fL — ABNORMAL LOW (ref 80.0–100.0)
Monocytes Absolute: 0.9 10*3/uL (ref 0.1–1.0)
Monocytes Relative: 6 %
Neutro Abs: 6.5 10*3/uL (ref 1.7–7.7)
Neutrophils Relative %: 45 %
Platelets: 541 10*3/uL — ABNORMAL HIGH (ref 150–400)
RBC: 3.27 MIL/uL — ABNORMAL LOW (ref 4.22–5.81)
RDW: 18.4 % — ABNORMAL HIGH (ref 11.5–15.5)
WBC: 14.7 10*3/uL — ABNORMAL HIGH (ref 4.0–10.5)
nRBC: 0 % (ref 0.0–0.2)

## 2021-07-12 LAB — RAPID URINE DRUG SCREEN, HOSP PERFORMED
Amphetamines: NOT DETECTED
Barbiturates: NOT DETECTED
Benzodiazepines: NOT DETECTED
Cocaine: NOT DETECTED
Opiates: POSITIVE — AB
Tetrahydrocannabinol: NOT DETECTED

## 2021-07-12 LAB — BASIC METABOLIC PANEL
Anion gap: 4 — ABNORMAL LOW (ref 5–15)
BUN: 8 mg/dL (ref 6–20)
CO2: 24 mmol/L (ref 22–32)
Calcium: 7.7 mg/dL — ABNORMAL LOW (ref 8.9–10.3)
Chloride: 104 mmol/L (ref 98–111)
Creatinine, Ser: 0.42 mg/dL — ABNORMAL LOW (ref 0.61–1.24)
GFR, Estimated: >60 mL/min (ref >60)
Glucose, Bld: 141 mg/dL — ABNORMAL HIGH (ref 70–99)
Potassium: 3.3 mmol/L — ABNORMAL LOW (ref 3.5–5.1)
Sodium: 132 mmol/L — ABNORMAL LOW (ref 135–145)

## 2021-07-12 LAB — RPR: RPR Ser Ql: NONREACTIVE

## 2021-07-12 LAB — HEPATITIS B SURFACE ANTIBODY, QUANTITATIVE: Hep B S AB Quant (Post): 10.6 m[IU]/mL (ref >9.9)

## 2021-07-12 MED ORDER — HYDROXYZINE HCL 25 MG PO TABS: 25.0000 mg | ORAL_TABLET | Freq: Three times a day (TID) | ORAL | Status: DC | PRN

## 2021-07-12 MED ORDER — SERTRALINE HCL 100 MG PO TABS
200.0000 mg | ORAL_TABLET | Freq: Every day | ORAL | Status: DC
  Administered 2021-07-12 – 2021-08-05 (×22): 200 mg via ORAL
  Filled 2021-07-12 (×25): qty 2

## 2021-07-12 MED ORDER — POTASSIUM CHLORIDE CRYS ER 20 MEQ PO TBCR
40.0000 meq | EXTENDED_RELEASE_TABLET | Freq: Once | ORAL | Status: AC
  Administered 2021-07-12: 40 meq via ORAL
  Filled 2021-07-12: qty 2

## 2021-07-12 MED ORDER — NALOXONE HCL 0.4 MG/ML IJ SOLN
0.2000 mg | Freq: Once | INTRAMUSCULAR | Status: AC
  Administered 2021-07-12: 0.2 mg via INTRAVENOUS
  Filled 2021-07-12: qty 1

## 2021-07-12 MED ORDER — TRAZODONE HCL 50 MG PO TABS
100.0000 mg | ORAL_TABLET | Freq: Every day | ORAL | Status: DC
  Administered 2021-07-12 – 2021-08-04 (×24): 100 mg via ORAL
  Filled 2021-07-12 (×24): qty 2

## 2021-07-12 MED ORDER — HYDROXYZINE HCL 25 MG PO TABS: 50.0000 mg | ORAL_TABLET | Freq: Four times a day (QID) | ORAL | Status: DC | PRN

## 2021-07-12 MED ORDER — OXYCODONE-ACETAMINOPHEN 5-325 MG PO TABS
1.0000 | ORAL_TABLET | ORAL | Status: DC | PRN
  Administered 2021-07-12 – 2021-07-16 (×9): 1 via ORAL
  Filled 2021-07-12 (×9): qty 1

## 2021-07-12 MED ORDER — HYDROXYZINE HCL 25 MG PO TABS: 50.0000 mg | ORAL_TABLET | Freq: Three times a day (TID) | ORAL | Status: DC | PRN

## 2021-07-12 MED ORDER — GABAPENTIN 300 MG PO CAPS
300.0000 mg | ORAL_CAPSULE | Freq: Three times a day (TID) | ORAL | Status: DC
  Administered 2021-07-12 (×2): 300 mg via ORAL
  Filled 2021-07-12 (×2): qty 1

## 2021-07-12 MED ORDER — MAGNESIUM SULFATE 2 GM/50ML IV SOLN
2.0000 g | Freq: Once | INTRAVENOUS | Status: AC
  Administered 2021-07-12: 2 g via INTRAVENOUS
  Filled 2021-07-12: qty 50

## 2021-07-12 MED ORDER — NICOTINE POLACRILEX 2 MG MT GUM
2.0000 mg | CHEWING_GUM | OROMUCOSAL | Status: DC | PRN
  Administered 2021-07-12 – 2021-07-19 (×10): 2 mg via ORAL
  Filled 2021-07-12 (×9): qty 1
  Filled 2021-07-12: qty 5
  Filled 2021-07-12 (×7): qty 1

## 2021-07-12 NOTE — Progress Notes (Addendum)
° ° ° ° °  INFECTIOUS DISEASE ATTENDING ADDENDUM:   Date: 07/12/2021  Patient name: Justin Fisher  Medical record number: [id number]  Date of birth: 17-Mar-1986   Patient growing group F Streptococcus from culture other organisms are not growing.  I expect that some mixed polymicrobial necrotizing fasciitis.  I do not think we need to cover MRSA at this point and we probably do not need antipseudomonal drugs.  I will DC his vancomycin today and likely narrow from Zosyn to [facility] 07/12/2021, 1:32 PM

## 2021-07-12 NOTE — Progress Notes (Signed)
PROGRESS NOTE  Justin Fisher  DOB: February 01, 1986  PCP: Patient, No Pcp Per (Inactive) IEP:[id number]  DOA: 07/09/2021  LOS: 3 days  Hospital Day: 4  Chief Complaint  Patient presents with   Arm Injury    Right arm    Brief narrative: Justin Fisher is a 36 y.o. male with PMH significant for IV drug abuse, hyperlipidemia, anxiety disorder, suicidal ideation. Patient presented to the ED on 07/09/2021 with complaint of severe pain and swelling of right arm.  Patient short heroin in the right arm 5 days ago after which it progressively became swollen, red, tender and started wheezing brown foul-smelling liquid. CT right humerus was obtained which showed extensive soft tissue swelling and air within the predominantly posterior and lateral aspect of right upper arm.  There is also cortical erosion within the humeral head greater tuberosity concerning for acute to subacute osteomyelitis.  There is also a rim-enhancing collection in the posterior to the mid to lateral aspect of the scapula suspicious for abscess. Went to the OR on 1/5.    Subjective: Continues to have pain  Assessment/Plan: Sepsis secondary to right arm cellulitis/necrotizing fasciitis/abscess -1/5: debridement of extensive right arm wound involving skin, subcutaneous tissue, muscle fascia and bone.  Wound VAC applied. -Blood culture NGTD, wound culture pending. -ID consulted:  -Continue Vancomycin and pip-tazo(monitor renal function ->change to daptomycin+ pip-tazo if there are signs of nephrotoxicity) -Continue Clindamycin for anti-toxin coverage for 72 hours total -Not a home IV antibiotic candidate. He is interested in suboxone. Anticipate  6 weeks of antibiotics given osteomyelitis.  -WBC count improving.  Continue to monitor. -pain control alternating PO and IV: plan to discuss suboxone with TOC on Monday to see if he could get follow up at a clinic  IV drug use -Uses IV heroin, cocaine -Counseled to quit  drugs.  Microcytic anemia -Unclear how acute it is.  Last labs from [facility] from 2016 with hemoglobin of 14. -Fe panel never ordered, will get in the AM  Hyponatremia -Sodium low slightly low between 130 and 134. -stable  Tobacco abuse -prefers gum to patches  Hypokalemia -Potassium continues to remain low.  Further repletion ordered today. -replete Mg as well  Mobility: Encourage ambulation Living condition:  Goals of care:   Code Status: Full Code  Nutritional status: Body mass index is 25.34 kg/m.  Nutrition Problem: Moderate Malnutrition Etiology: social / environmental circumstances (IV drug use) Signs/Symptoms: mild fat depletion, moderate muscle depletion Diet:  Diet Order             Diet NPO time specified Except for: Sips with Meds  Diet effective midnight           Diet regular Room service appropriate? Yes; Fluid consistency: Thin  Diet effective now                  DVT prophylaxis:  SCDs Start: 07/11/21 0832 SCDs Start: 07/09/21 2310   Antimicrobials: Broad-spectrum antibiotics Consultants: Orthopedics/ID Family Communication: None at bedside  Status is: Inpatient   Dispo: The patient is from: Home              Anticipated d/c is to: will remain in hospital for IV abx as not a candidate for home IV Abx              Patient currently is not medically stable to d/c.       Infusions:   sodium chloride 75 mL/hr at 07/11/21 0837   clindamycin (CLEOCIN) IV 900  mg (07/12/21 0559)   piperacillin-tazobactam (ZOSYN)  IV 3.375 g (07/12/21 0109)   vancomycin 1,250 mg (07/11/21 2324)    Scheduled Meds:  chlorhexidine  60 mL Topical Once   Chlorhexidine Gluconate Cloth  6 each Topical Q0600   docusate sodium  100 mg Oral BID   feeding supplement  237 mL Oral BID BM   gabapentin  300 mg Oral TID   multivitamin with minerals  1 tablet Oral Daily   mupirocin ointment  1 application Nasal BID   povidone-iodine  2 application Topical Once    sertraline  200 mg Oral Daily   traZODone  100 mg Oral QHS    PRN meds: acetaminophen **OR** acetaminophen, albuterol, HYDROmorphone (DILAUDID) injection, metoCLOPramide **OR** metoCLOPramide (REGLAN) injection, ondansetron **OR** ondansetron (ZOFRAN) IV, oxyCODONE-acetaminophen   Antimicrobials: Anti-infectives (From admission, onward)    Start     Dose/Rate Route Frequency Ordered Stop   07/11/21 0600  ceFAZolin (ANCEF) IVPB 2g/100 mL premix  Status:  Discontinued        2 g 200 mL/hr over 30 Minutes Intravenous On call to O.R. 07/10/21 1347 07/10/21 1352   07/10/21 1445  ceFAZolin (ANCEF) IVPB 2g/100 mL premix        2 g 200 mL/hr over 30 Minutes Intravenous On call to O.R. 07/10/21 1352 07/10/21 1636   07/10/21 1300  clindamycin (CLEOCIN) IVPB 900 mg        900 mg 100 mL/hr over 30 Minutes Intravenous Every 8 hours 07/10/21 0018 07/13/21 1259   07/10/21 1000  vancomycin (VANCOREADY) IVPB 1250 mg/250 mL        1,250 mg 166.7 mL/hr over 90 Minutes Intravenous Every 12 hours 07/09/21 2121     07/10/21 1000  piperacillin-tazobactam (ZOSYN) IVPB 3.375 g        3.375 g 12.5 mL/hr over 240 Minutes Intravenous Every 8 hours 07/10/21 0912     07/09/21 2200  piperacillin-tazobactam (ZOSYN) IVPB 3.375 g  Status:  Discontinued        3.375 g 12.5 mL/hr over 240 Minutes Intravenous Every 8 hours 07/09/21 1958 07/10/21 0912   07/09/21 2000  vancomycin (VANCOREADY) IVPB 1500 mg/300 mL        1,500 mg 150 mL/hr over 120 Minutes Intravenous  Once 07/09/21 1956 07/10/21 0043   07/09/21 1845  vancomycin (VANCOCIN) IVPB 1000 mg/200 mL premix  Status:  Discontinued        1,000 mg 200 mL/hr over 60 Minutes Intravenous  Once 07/09/21 1843 07/09/21 2121       Objective: Vitals:   07/11/21 2050 07/12/21 0523  BP: 117/80 (!) 150/81  Pulse:  61  Resp: 18   Temp: 98.4 F (36.9 C) (!) 97.5 F (36.4 C)  SpO2: 100%     Intake/Output Summary (Last 24 hours) at 07/12/2021 0839 Last data filed  at 07/11/2021 2048 Gross per 24 hour  Intake --  Output 1000 ml  Net -1000 ml   Filed Weights   07/10/21 2308  Weight: 73.4 kg   Weight change:  Body mass index is 25.34 kg/m.   Physical Exam:  General: Appearance:     Overweight male in no acute distress     Lungs:     respirations unlabored  Heart:    Normal heart rate.    MS:   All extremities are intact. Wound on right UE   Neurologic:   Awake, alert, oriented x 3. No apparent focal neurological  defect.      Data Review: I have personally reviewed the laboratory data and studies available.     Signed, Joseph ArtJessica U Graylon Amory, DO [facility] 07/12/2021

## 2021-07-12 NOTE — Progress Notes (Signed)
Pt has rec'd pm medication as well as prn for pain. Pt has noted to become extremely drowsy, nodding while talking  and eating. Writer as pt if he was ok and he stated that he is has not been sleeping. Pt noted to be continuously holding his nose as well. He did go the the restroom and did pull one of the Ivs out of his (L) forearm. Will continue close observation and monitor for changes in vs.

## 2021-07-12 NOTE — Progress Notes (Addendum)
Justin Fisher found pt drowsy and could not stay still. She also saw visitor trying to hide an item from his pants. Informed Administrator, arts and Dr. Benjamine Mola DO. Assessed pt and found him so drowsy compared to been awake and alert since AM. HR 113. Pt denied taking drugs and visitor denied giving pt a drug. New orders placed and will continue to observe pt closely. Bed alarm has been set.

## 2021-07-12 NOTE — Progress Notes (Signed)
Pharmacy Antibiotic Note  Justin Fisher is a 36 y.o. male admitted on 07/09/2021 with cellulitisabscess.  PMH is significant for IV drug use.Patient is now s/p I&D of the arm 1/5. Infectious diseases on board. Pharmacy has been consulted for vancomycin and zosyn dosing dosing.  Patient remains afebrile, WBC down to 14.7. Baseline Scr 0.6 (0.42 on 1/7)- will round up to 0.8 for AUC dosing   Plan: Continue Piperacillin-tazobactam 3.375g q8h Continue Vancomycin 1250 mg q 12h (estimated AUC 483) Continue Clindamycin 900 mg IV Q8H (72h total per ID)  Goal AUC 400-550 Follow-up cultures- wound, blood  Monitor renal function, F/U vancomycin levels  Height: 5\' 7"  (170.2 cm) Weight: 73.4 kg (161 lb 13.1 oz) IBW/kg (Calculated) : 66.1  Temp (24hrs), Avg:98.2 F (36.8 C), Min:97.5 F (36.4 C), Max:98.5 F (36.9 C)  Recent Labs  Lab 07/09/21 1900 07/09/21 2200 07/10/21 0254 07/10/21 1559 07/11/21 0224 07/12/21 0648  WBC 21.1*  --  15.7*  --  14.7* 14.7*  CREATININE 0.64  --  0.63 0.30* 0.62 0.42*  LATICACIDVEN  --  1.2  --   --   --   --      Estimated Creatinine Clearance: 118.2 mL/min (A) (by C-G formula based on SCr of 0.42 mg/dL (L)).    No Known Allergies  Antimicrobials this admission: Piperacillin-tazobactam 1/4>>  Vancomycin 1/4>>  Clindamycin 1/5>>(1/8)  Dose adjustments this admission: none  Microbiology results: 1/4 Bcx: ngtd 1/4 Wound cx: mod gram variable rod, Group F strep  1/5 Wound cx: abundant GNRs, GPRs, rare/few GPCs in pairs   09/09/21, PharmD PGY-1 Acute Care Resident  07/12/2021 9:34 AM

## 2021-07-12 NOTE — Progress Notes (Signed)
Subjective: 2 Days Post-Op Procedure(s) (LRB): IRRIGATION AND DEBRIDEMENT RIGHT UPPER ARM (Right) APPLICATION OF WOUND VAC (Right)  Activity level:  OOB Diet tolerance:  regular Voiding:  well  Patient reports pain as moderate.    Objective: Vital signs in last 24 hours: Temp:  [97.5 F (36.4 C)-98.4 F (36.9 C)] 98.1 F (36.7 C) (01/07 1200) Pulse Rate:  [61-79] 79 (01/07 1200) Resp:  [18-21] 19 (01/07 1200) BP: (117-157)/(80-94) 139/88 (01/07 1200) SpO2:  [97 %-100 %] 97 % (01/07 1200)  Labs: Recent Labs    07/09/21 1900 07/10/21 0254 07/10/21 1559 07/11/21 0224 07/12/21 0648  HGB 7.5* 7.2* 10.2* 7.4* 7.5*   Recent Labs    07/11/21 0224 07/12/21 0648  WBC 14.7* 14.7*  RBC 3.25* 3.27*  HCT 23.7* 24.1*  PLT 548* 541*   Recent Labs    07/11/21 0224 07/12/21 0648  NA 134* 132*  K 3.1* 3.3*  CL 103 104  CO2 23 24  BUN 10 8  CREATININE 0.62 0.42*  GLUCOSE 153* 141*  CALCIUM 7.6* 7.7*   No results for input(s): LABPT, INR in the last 72 hours.  Physical Exam:  Neurologically intact Sensation intact distally Intact pulses distally Compartment soft  Assessment/Plan:  2 Days Post-Op Procedure(s) (LRB): IRRIGATION AND DEBRIDEMENT RIGHT UPPER ARM (Right) APPLICATION OF WOUND VAC (Right)  Plan to take to OR tomorrow morning at 730 for repeat I&D  and vac replacement.  Continue IV abx as adjusted by ID.  Plastics consult pending.  Justin Fisher 07/12/2021, 1:56 PM

## 2021-07-13 ENCOUNTER — Encounter (HOSPITAL_COMMUNITY)
Admission: EM | Disposition: A | Discharge status: Home / Self Care | Payer: Self-pay | Source: Home / Self Care | Attending: Internal Medicine

## 2021-07-13 ENCOUNTER — Inpatient Hospital Stay (HOSPITAL_COMMUNITY): Payer: BC Managed Care – PPO | Admitting: Anesthesiology

## 2021-07-13 ENCOUNTER — Encounter (HOSPITAL_COMMUNITY): Payer: Self-pay | Admitting: Internal Medicine

## 2021-07-13 DIAGNOSIS — E43 Unspecified severe protein-calorie malnutrition: Secondary | ICD-10-CM

## 2021-07-13 DIAGNOSIS — L02413 Cutaneous abscess of right upper limb: Secondary | ICD-10-CM | POA: Diagnosis not present

## 2021-07-13 DIAGNOSIS — A419 Sepsis, unspecified organism: Secondary | ICD-10-CM | POA: Diagnosis not present

## 2021-07-13 DIAGNOSIS — D649 Anemia, unspecified: Secondary | ICD-10-CM | POA: Diagnosis not present

## 2021-07-13 HISTORY — PX: I & D EXTREMITY: SHX5045

## 2021-07-13 LAB — CBC WITH DIFFERENTIAL/PLATELET
Abs Immature Granulocytes: 0.3 10*3/uL — ABNORMAL HIGH (ref 0.00–0.07)
Basophils Absolute: 0.2 10*3/uL — ABNORMAL HIGH (ref 0.0–0.1)
Basophils Relative: 1 %
Eosinophils Absolute: 0.3 10*3/uL (ref 0.0–0.5)
Eosinophils Relative: 2 %
HCT: 20.4 % — ABNORMAL LOW (ref 39.0–52.0)
Hemoglobin: 6.3 g/dL — CL (ref 13.0–17.0)
Lymphocytes Relative: 45 %
Lymphs Abs: 7.2 10*3/uL — ABNORMAL HIGH (ref 0.7–4.0)
MCH: 23.5 pg — ABNORMAL LOW (ref 26.0–34.0)
MCHC: 30.9 g/dL (ref 30.0–36.0)
MCV: 76.1 fL — ABNORMAL LOW (ref 80.0–100.0)
Monocytes Absolute: 0.6 10*3/uL (ref 0.1–1.0)
Monocytes Relative: 4 %
Myelocytes: 2 %
Neutro Abs: 7.4 10*3/uL (ref 1.7–7.7)
Neutrophils Relative %: 46 %
Platelets: 474 10*3/uL — ABNORMAL HIGH (ref 150–400)
RBC: 2.68 MIL/uL — ABNORMAL LOW (ref 4.22–5.81)
RDW: 19.1 % — ABNORMAL HIGH (ref 11.5–15.5)
WBC: 16 10*3/uL — ABNORMAL HIGH (ref 4.0–10.5)
nRBC: 0 % (ref 0.0–0.2)
nRBC: 0 /100 WBC

## 2021-07-13 LAB — PREPARE RBC (CROSSMATCH)

## 2021-07-13 LAB — BASIC METABOLIC PANEL
Anion gap: 5 (ref 5–15)
BUN: 12 mg/dL (ref 6–20)
CO2: 25 mmol/L (ref 22–32)
Calcium: 7.9 mg/dL — ABNORMAL LOW (ref 8.9–10.3)
Chloride: 107 mmol/L (ref 98–111)
Creatinine, Ser: 0.56 mg/dL — ABNORMAL LOW (ref 0.61–1.24)
GFR, Estimated: >60 mL/min (ref >60)
Glucose, Bld: 126 mg/dL — ABNORMAL HIGH (ref 70–99)
Potassium: 3.7 mmol/L (ref 3.5–5.1)
Sodium: 137 mmol/L (ref 135–145)

## 2021-07-13 LAB — IRON AND TIBC
Iron: 55 ug/dL (ref 45–182)
Saturation Ratios: 16 % — ABNORMAL LOW (ref 17.9–39.5)
TIBC: 343 ug/dL (ref 250–450)
UIBC: 288 ug/dL

## 2021-07-13 LAB — FERRITIN: Ferritin: 215 ng/mL (ref 24–336)

## 2021-07-13 SURGERY — IRRIGATION AND DEBRIDEMENT EXTREMITY
Anesthesia: General | Site: Arm Upper | Laterality: Right

## 2021-07-13 MED ORDER — KETOROLAC TROMETHAMINE 30 MG/ML IJ SOLN
INTRAMUSCULAR | Status: AC
  Filled 2021-07-13: qty 1

## 2021-07-13 MED ORDER — PROPOFOL 10 MG/ML IV BOLUS
INTRAVENOUS | Status: AC
  Filled 2021-07-13: qty 20

## 2021-07-13 MED ORDER — KETAMINE INJECTION FOR OPTIME (MG/KG/HR) DOCUMENTATION
INTRAVENOUS | Status: DC | PRN
Start: 2021-07-13 — End: 2021-07-13
  Administered 2021-07-13: 10 mg via INTRAVENOUS
  Administered 2021-07-13: 40 mg via INTRAVENOUS

## 2021-07-13 MED ORDER — ROCURONIUM BROMIDE 10 MG/ML (PF) SYRINGE
PREFILLED_SYRINGE | INTRAVENOUS | Status: AC
  Filled 2021-07-13: qty 10

## 2021-07-13 MED ORDER — ONDANSETRON HCL 4 MG PO TABS
4.0000 mg | ORAL_TABLET | Freq: Four times a day (QID) | ORAL | Status: DC | PRN
  Administered 2021-07-29: 08:00:00 4 mg via ORAL
  Filled 2021-07-13: qty 1

## 2021-07-13 MED ORDER — SUGAMMADEX SODIUM 200 MG/2ML IV SOLN
INTRAVENOUS | Status: DC | PRN
  Administered 2021-07-13: 200 mg via INTRAVENOUS

## 2021-07-13 MED ORDER — MIDAZOLAM HCL 2 MG/2ML IJ SOLN
INTRAMUSCULAR | Status: AC
  Filled 2021-07-13: qty 2

## 2021-07-13 MED ORDER — LACTATED RINGERS IV SOLN: INTRAVENOUS | Status: DC

## 2021-07-13 MED ORDER — GABAPENTIN 400 MG PO CAPS
400.0000 mg | ORAL_CAPSULE | Freq: Three times a day (TID) | ORAL | Status: DC
  Administered 2021-07-13 – 2021-08-05 (×67): 400 mg via ORAL
  Filled 2021-07-13 (×70): qty 1

## 2021-07-13 MED ORDER — ROCURONIUM BROMIDE 10 MG/ML (PF) SYRINGE
PREFILLED_SYRINGE | INTRAVENOUS | Status: DC | PRN
Start: 2021-07-13 — End: 2021-07-13
  Administered 2021-07-13: 50 mg via INTRAVENOUS

## 2021-07-13 MED ORDER — DEXAMETHASONE SODIUM PHOSPHATE 10 MG/ML IJ SOLN
INTRAMUSCULAR | Status: DC | PRN
  Administered 2021-07-13: 10 mg via INTRAVENOUS

## 2021-07-13 MED ORDER — FENTANYL CITRATE (PF) 250 MCG/5ML IJ SOLN
INTRAMUSCULAR | Status: DC | PRN
  Administered 2021-07-13: 50 ug via INTRAVENOUS
  Administered 2021-07-13 (×2): 100 ug via INTRAVENOUS

## 2021-07-13 MED ORDER — ONDANSETRON HCL 4 MG/2ML IJ SOLN
4.0000 mg | Freq: Four times a day (QID) | INTRAMUSCULAR | Status: DC | PRN
  Administered 2021-07-22: 4 mg via INTRAVENOUS

## 2021-07-13 MED ORDER — SODIUM CHLORIDE 0.9% IV SOLUTION: Freq: Once | INTRAVENOUS | Status: AC

## 2021-07-13 MED ORDER — FENTANYL CITRATE (PF) 250 MCG/5ML IJ SOLN
INTRAMUSCULAR | Status: AC
  Filled 2021-07-13: qty 5

## 2021-07-13 MED ORDER — ONDANSETRON HCL 4 MG/2ML IJ SOLN
INTRAMUSCULAR | Status: DC | PRN
Start: 2021-07-13 — End: 2021-07-13
  Administered 2021-07-13: 4 mg via INTRAVENOUS

## 2021-07-13 MED ORDER — KETAMINE HCL 50 MG/5ML IJ SOSY
PREFILLED_SYRINGE | INTRAMUSCULAR | Status: AC
  Filled 2021-07-13: qty 5

## 2021-07-13 MED ORDER — HYDROMORPHONE HCL 1 MG/ML IJ SOLN
INTRAMUSCULAR | Status: AC
  Administered 2021-07-13: 0.25 mg via INTRAVENOUS
  Filled 2021-07-13: qty 1

## 2021-07-13 MED ORDER — HYDROMORPHONE HCL 1 MG/ML IJ SOLN
0.2500 mg | INTRAMUSCULAR | Status: DC | PRN
  Administered 2021-07-13: 0.5 mg via INTRAVENOUS
  Administered 2021-07-13: 0.25 mg via INTRAVENOUS

## 2021-07-13 MED ORDER — METOCLOPRAMIDE HCL 5 MG PO TABS: 5.0000 mg | ORAL_TABLET | Freq: Three times a day (TID) | ORAL | Status: DC | PRN

## 2021-07-13 MED ORDER — MIDAZOLAM HCL 2 MG/2ML IJ SOLN
INTRAMUSCULAR | Status: DC | PRN
  Administered 2021-07-13: 2 mg via INTRAVENOUS

## 2021-07-13 MED ORDER — DOCUSATE SODIUM 100 MG PO CAPS
100.0000 mg | ORAL_CAPSULE | Freq: Two times a day (BID) | ORAL | Status: DC
  Administered 2021-07-14 – 2021-08-04 (×13): 100 mg via ORAL
  Filled 2021-07-13 (×33): qty 1

## 2021-07-13 MED ORDER — PROPOFOL 10 MG/ML IV BOLUS
INTRAVENOUS | Status: DC | PRN
  Administered 2021-07-13: 200 mg via INTRAVENOUS

## 2021-07-13 MED ORDER — CHLORHEXIDINE GLUCONATE 0.12 % MT SOLN
OROMUCOSAL | Status: AC
  Administered 2021-07-13: 15 mL via OROMUCOSAL
  Filled 2021-07-13: qty 15

## 2021-07-13 MED ORDER — DEXAMETHASONE SODIUM PHOSPHATE 10 MG/ML IJ SOLN
INTRAMUSCULAR | Status: AC
  Filled 2021-07-13: qty 1

## 2021-07-13 MED ORDER — SODIUM CHLORIDE 0.9% IV SOLUTION
Freq: Once | INTRAVENOUS | Status: AC
  Administered 2021-07-13: 50 mL/h via INTRAVENOUS

## 2021-07-13 MED ORDER — CHLORHEXIDINE GLUCONATE 0.12 % MT SOLN: 15.0000 mL | Freq: Once | OROMUCOSAL | Status: AC

## 2021-07-13 MED ORDER — LIDOCAINE 2% (20 MG/ML) 5 ML SYRINGE
INTRAMUSCULAR | Status: DC | PRN
  Administered 2021-07-13: 30 mg via INTRAVENOUS

## 2021-07-13 MED ORDER — ONDANSETRON HCL 4 MG/2ML IJ SOLN
INTRAMUSCULAR | Status: AC
  Filled 2021-07-13: qty 2

## 2021-07-13 MED ORDER — HYDROXYZINE HCL 25 MG PO TABS
50.0000 mg | ORAL_TABLET | Freq: Four times a day (QID) | ORAL | Status: DC | PRN
  Administered 2021-07-13 – 2021-07-17 (×5): 50 mg via ORAL
  Filled 2021-07-13 (×5): qty 2

## 2021-07-13 MED ORDER — KETOROLAC TROMETHAMINE 30 MG/ML IJ SOLN
INTRAMUSCULAR | Status: DC | PRN
  Administered 2021-07-13: 30 mg via INTRAVENOUS

## 2021-07-13 MED ORDER — METOCLOPRAMIDE HCL 5 MG/ML IJ SOLN: 5.0000 mg | Freq: Three times a day (TID) | INTRAMUSCULAR | Status: DC | PRN

## 2021-07-13 MED ORDER — MIRTAZAPINE 15 MG PO TABS
15.0000 mg | ORAL_TABLET | Freq: Every day | ORAL | Status: DC
  Administered 2021-07-13 – 2021-07-16 (×4): 15 mg via ORAL
  Filled 2021-07-13 (×4): qty 1

## 2021-07-13 MED ORDER — LACTATED RINGERS IV SOLN: INTRAVENOUS | Status: DC | PRN

## 2021-07-13 MED ORDER — SODIUM CHLORIDE 0.9 % IR SOLN
Status: DC | PRN
  Administered 2021-07-13: 1000 mL
  Administered 2021-07-13: 3000 mL

## 2021-07-13 SURGICAL SUPPLY — 50 items
BAG COUNTER SPONGE SURGICOUNT (BAG) ×2 IMPLANT
BLADE SURG 10 STRL SS (BLADE) ×1 IMPLANT
BNDG COHESIVE 4X5 TAN STRL (GAUZE/BANDAGES/DRESSINGS) ×2 IMPLANT
BNDG ELASTIC 4X5.8 VLCR STR LF (GAUZE/BANDAGES/DRESSINGS) ×1 IMPLANT
BNDG ELASTIC 6X5.8 VLCR STR LF (GAUZE/BANDAGES/DRESSINGS) ×1 IMPLANT
BNDG GAUZE ELAST 4 BULKY (GAUZE/BANDAGES/DRESSINGS) ×1 IMPLANT
CANISTER WOUNDNEG PRESSURE 500 (CANNISTER) ×1 IMPLANT
CONNECTOR Y WND VAC (MISCELLANEOUS) IMPLANT
COVER SURGICAL LIGHT HANDLE (MISCELLANEOUS) ×2 IMPLANT
CUFF TOURN SGL QUICK 34 (TOURNIQUET CUFF)
CUFF TOURN SGL QUICK 42 (TOURNIQUET CUFF) IMPLANT
CUFF TRNQT CYL 34X4.125X (TOURNIQUET CUFF) IMPLANT
DRAPE DERMATAC (DRAPES) ×2 IMPLANT
DRAPE INCISE IOBAN 66X45 STRL (DRAPES) ×1 IMPLANT
DRAPE ORTHO SPLIT 77X108 STRL (DRAPES) ×2
DRAPE SURG ORHT 6 SPLT 77X108 (DRAPES) IMPLANT
DRSG VAC ATS LRG SENSATRAC (GAUZE/BANDAGES/DRESSINGS) ×1 IMPLANT
DURAPREP 26ML APPLICATOR (WOUND CARE) ×1 IMPLANT
ELECT REM PT RETURN 9FT ADLT (ELECTROSURGICAL) ×2
ELECTRODE REM PT RTRN 9FT ADLT (ELECTROSURGICAL) IMPLANT
EVACUATOR 1/8 PVC DRAIN (DRAIN) IMPLANT
FACESHIELD WRAPAROUND (MASK) IMPLANT
FACESHIELD WRAPAROUND OR TEAM (MASK) ×2 IMPLANT
GAUZE SPONGE 4X4 12PLY STRL (GAUZE/BANDAGES/DRESSINGS) ×2 IMPLANT
GAUZE XEROFORM 1X8 LF (GAUZE/BANDAGES/DRESSINGS) ×1 IMPLANT
GLOVE SURG ENC MOIS LTX SZ8 (GLOVE) ×8 IMPLANT
GOWN STRL REUS W/ TWL LRG LVL3 (GOWN DISPOSABLE) ×3 IMPLANT
GOWN STRL REUS W/TWL 2XL LVL3 (GOWN DISPOSABLE) ×2 IMPLANT
GOWN STRL REUS W/TWL LRG LVL3 (GOWN DISPOSABLE) ×2
HANDPIECE INTERPULSE COAX TIP (DISPOSABLE) ×1
KIT BASIN OR (CUSTOM PROCEDURE TRAY) ×2 IMPLANT
KIT TURNOVER KIT B (KITS) ×2 IMPLANT
MANIFOLD NEPTUNE II (INSTRUMENTS) ×2 IMPLANT
NS IRRIG 1000ML POUR BTL (IV SOLUTION) ×2 IMPLANT
PACK ORTHO EXTREMITY (CUSTOM PROCEDURE TRAY) ×2 IMPLANT
PAD ARMBOARD 7.5X6 YLW CONV (MISCELLANEOUS) ×3 IMPLANT
PAD NEG PRESSURE SENSATRAC (MISCELLANEOUS) ×1 IMPLANT
PENCIL BUTTON HOLSTER BLD 10FT (ELECTRODE) IMPLANT
SET HNDPC FAN SPRY TIP SCT (DISPOSABLE) IMPLANT
SPONGE T-LAP 18X18 ~~LOC~~+RFID (SPONGE) ×2 IMPLANT
STOCKINETTE IMPERVIOUS 9X36 MD (GAUZE/BANDAGES/DRESSINGS) ×2 IMPLANT
SUT ETHILON 3 0 PS 1 (SUTURE) IMPLANT
SWAB CULTURE ESWAB REG 1ML (MISCELLANEOUS) IMPLANT
TOWEL GREEN STERILE (TOWEL DISPOSABLE) ×2 IMPLANT
TOWEL GREEN STERILE FF (TOWEL DISPOSABLE) ×2 IMPLANT
TUBE CONNECTING 12X1/4 (SUCTIONS) ×2 IMPLANT
UNDERPAD 30X36 HEAVY ABSORB (UNDERPADS AND DIAPERS) ×2 IMPLANT
WATER STERILE IRR 1000ML POUR (IV SOLUTION) ×2 IMPLANT
WND VAC CONN Y (MISCELLANEOUS) ×1
YANKAUER SUCT BULB TIP NO VENT (SUCTIONS) ×2 IMPLANT

## 2021-07-13 NOTE — Progress Notes (Signed)
Lab called r/t low hgb level of 6.3. Notified md on call by secure text. O.R called to very pt's status pre-procedure. Updated on all of pts current issues. IV team in the process of placing a 2nd line. Will update as needed.

## 2021-07-13 NOTE — Progress Notes (Signed)
PROGRESS NOTE  Justin Fisher  DOB: 09/27/1985  PCP: Patient, No Pcp Per (Inactive) DDU:[id number]  DOA: 07/09/2021  LOS: 4 days  Hospital Day: 5  Chief Complaint  Patient presents with   Arm Injury    Right arm    Brief narrative: Justin Fisher is a 36 y.o. male with PMH significant for IV drug abuse, hyperlipidemia, anxiety disorder, suicidal ideation. Patient presented to the ED on 07/09/2021 with complaint of severe pain and swelling of right arm.  Patient short heroin in the right arm 5 days ago after which it progressively became swollen, red, tender and started wheezing brown foul-smelling liquid. CT right humerus was obtained which showed extensive soft tissue swelling and air within the predominantly posterior and lateral aspect of right upper arm.  There is also cortical erosion within the humeral head greater tuberosity concerning for acute to subacute osteomyelitis.  There is also a rim-enhancing collection in the posterior to the mid to lateral aspect of the scapula suspicious for abscess. Went to the OR on 1/5 and again on 1/8  Subjective: Nursing reports patient has mental status changes after a visitor left yesterday-- patient denied taking any drugs/medications -patient would like to get back on methadone   Assessment/Plan: Sepsis secondary to right arm cellulitis/necrotizing fasciitis/abscess -1/5: debridement of extensive right arm wound involving skin, subcutaneous tissue, muscle fascia and bone.  Wound VAC applied. -Blood culture NGTD, wound culture  group F Streptococcus  -ID consulted:  -group F: d/c vanc, zosyn IV with ? Narrow to unasyn -pain control alternating PO and IV:  discuss methdone with TOC on Monday to see if he could get follow up at a clinic -elevate extremity   IV drug use -Uses IV heroin, cocaine -Counseled to quit drugs.  Microcytic anemia --s/p 2 units on 1/8 -repeat h/h ordered  Hyponatremia -stable  Tobacco abuse -prefers gum to  patches  Hypokalemia -repleted  Mobility: Encourage ambulation Living condition:  Goals of care:   Code Status: Full Code  Nutritional status: Body mass index is 25.34 kg/m.  Nutrition Problem: Moderate Malnutrition Etiology: social / environmental circumstances (IV drug use) Signs/Symptoms: mild fat depletion, moderate muscle depletion Diet:  Diet Order             Diet regular Room service appropriate? Yes; Fluid consistency: Thin  Diet effective now                  DVT prophylaxis:  SCDs Start: 07/13/21 1055 SCDs Start: 07/11/21 0832 SCDs Start: 07/09/21 2310   Antimicrobials: Broad-spectrum antibiotics Consultants: Orthopedics/ID   Status is: Inpatient   Dispo: The patient is from: Home              Anticipated d/c is to: will remain in hospital for IV abx as not a candidate for home IV Abx              Patient currently is not medically stable to d/c.       Infusions:   sodium chloride Stopped (07/13/21 0410)   piperacillin-tazobactam (ZOSYN)  IV 12.5 mL/hr at 07/13/21 0420    Scheduled Meds:  sodium chloride   Intravenous Once   Chlorhexidine Gluconate Cloth  6 each Topical Q0600   docusate sodium  100 mg Oral BID   feeding supplement  237 mL Oral BID BM   gabapentin  400 mg Oral TID   mirtazapine  15 mg Oral QHS   multivitamin with minerals  1 tablet Oral Daily  mupirocin ointment  1 application Nasal BID   sertraline  200 mg Oral Daily   traZODone  100 mg Oral QHS    PRN meds: acetaminophen **OR** acetaminophen, albuterol, HYDROmorphone (DILAUDID) injection, hydrOXYzine, metoCLOPramide **OR** metoCLOPramide (REGLAN) injection, nicotine polacrilex, ondansetron **OR** ondansetron (ZOFRAN) IV, oxyCODONE-acetaminophen   Antimicrobials: Anti-infectives (From admission, onward)    Start     Dose/Rate Route Frequency Ordered Stop   07/11/21 0600  ceFAZolin (ANCEF) IVPB 2g/100 mL premix  Status:  Discontinued        2 g 200 mL/hr over 30  Minutes Intravenous On call to O.R. 07/10/21 1347 07/10/21 1352   07/10/21 1445  ceFAZolin (ANCEF) IVPB 2g/100 mL premix        2 g 200 mL/hr over 30 Minutes Intravenous On call to O.R. 07/10/21 1352 07/10/21 1636   07/10/21 1300  clindamycin (CLEOCIN) IVPB 900 mg  Status:  Discontinued        900 mg 100 mL/hr over 30 Minutes Intravenous Every 8 hours 07/10/21 0018 07/13/21 1049   07/10/21 1000  vancomycin (VANCOREADY) IVPB 1250 mg/250 mL  Status:  Discontinued        1,250 mg 166.7 mL/hr over 90 Minutes Intravenous Every 12 hours 07/09/21 2121 07/12/21 1333   07/10/21 1000  piperacillin-tazobactam (ZOSYN) IVPB 3.375 g        3.375 g 12.5 mL/hr over 240 Minutes Intravenous Every 8 hours 07/10/21 0912     07/09/21 2200  piperacillin-tazobactam (ZOSYN) IVPB 3.375 g  Status:  Discontinued        3.375 g 12.5 mL/hr over 240 Minutes Intravenous Every 8 hours 07/09/21 1958 07/10/21 0912   07/09/21 2000  vancomycin (VANCOREADY) IVPB 1500 mg/300 mL        1,500 mg 150 mL/hr over 120 Minutes Intravenous  Once 07/09/21 1956 07/10/21 0043   07/09/21 1845  vancomycin (VANCOCIN) IVPB 1000 mg/200 mL premix  Status:  Discontinued        1,000 mg 200 mL/hr over 60 Minutes Intravenous  Once 07/09/21 1843 07/09/21 2121       Objective: Vitals:   07/13/21 1032 07/13/21 1049  BP: (!) 170/93 (!) 150/91  Pulse: (!) 55 (!) 53  Resp: 12 11  Temp: 97.8 F (36.6 C) 97.6 F (36.4 C)  SpO2: 96% 100%    Intake/Output Summary (Last 24 hours) at 07/13/2021 1220 Last data filed at 07/13/2021 0938 Gross per 24 hour  Intake 3548.13 ml  Output 300 ml  Net 3248.13 ml   Filed Weights   07/10/21 2308  Weight: 73.4 kg   Weight change:  Body mass index is 25.34 kg/m.   Physical Exam:   General: Appearance:     Overweight male in no acute distress     Lungs:     respirations unlabored  Heart:    Bradycardic.   MS:   All extremities are intact. Wound vac on right arm with swelling   Neurologic:    Awake, alert, oriented x 3. No apparent focal neurological           defect.        Data Review: I have personally reviewed the laboratory data and studies available.     Signed, Geradine Girt, DO [facility] 07/13/2021

## 2021-07-13 NOTE — Transfer of Care (Signed)
Immediate Anesthesia Transfer of Care Note  Patient: Justin Fisher  Procedure(s) Performed: IRRIGATION AND DEBRIDEMENT UPPER EXTREMITY (Right: Arm Upper)  Patient Location: PACU  Anesthesia Type:General  Level of Consciousness: awake, alert  and oriented  Airway & Oxygen Therapy: Patient Spontanous Breathing  Post-op Assessment: Report given to RN and Post -op Vital signs reviewed and stable  Post vital signs: Reviewed and stable  Last Vitals:  Vitals Value Taken Time  BP 171/91 07/13/21 0947  Temp    Pulse 59 07/13/21 0952  Resp 10 07/13/21 0952  SpO2 99 % 07/13/21 0952  Vitals shown include unvalidated device data.  Last Pain:  Vitals:   07/13/21 0200  TempSrc:   PainSc: Asleep      Patients Stated Pain Goal: 4 (07/10/21 1415)  Complications: No notable events documented.

## 2021-07-13 NOTE — Anesthesia Preprocedure Evaluation (Addendum)
Anesthesia Evaluation  Patient identified by MRN, date of birth, ID band Patient awake    Reviewed: Allergy & Precautions, H&P , NPO status , Patient's Chart, lab work & pertinent test results  Airway Mallampati: II  TM Distance: >3 FB Neck ROM: Full    Dental no notable dental hx. (+) Teeth Intact, Dental Advisory Given   Pulmonary neg pulmonary ROS, Current Smoker and Patient abstained from smoking.,    Pulmonary exam normal breath sounds clear to auscultation       Cardiovascular negative cardio ROS   Rhythm:Regular Rate:Normal     Neuro/Psych Anxiety Depression negative neurological ROS     GI/Hepatic negative GI ROS, (+)     substance abuse  IV drug use,   Endo/Other  negative endocrine ROS  Renal/GU negative Renal ROS  negative genitourinary   Musculoskeletal   Abdominal   Peds  Hematology  (+) Blood dyscrasia, anemia ,   Anesthesia Other Findings   Reproductive/Obstetrics negative OB ROS                            Anesthesia Physical Anesthesia Plan  ASA: 3  Anesthesia Plan: General   Post-op Pain Management: Toradol IV (intra-op), Tylenol PO (pre-op), Ketamine IV and Gabapentin PO (pre-op)   Induction: Intravenous  PONV Risk Score and Plan: 2 and Midazolam, Dexamethasone and Ondansetron  Airway Management Planned: LMA  Additional Equipment:   Intra-op Plan:   Post-operative Plan: Extubation in OR  Informed Consent: I have reviewed the patients History and Physical, chart, labs and discussed the procedure including the risks, benefits and alternatives for the proposed anesthesia with the patient or authorized representative who has indicated his/her understanding and acceptance.     Dental advisory given  Plan Discussed with: CRNA  Anesthesia Plan Comments:         Anesthesia Quick Evaluation

## 2021-07-13 NOTE — Anesthesia Procedure Notes (Signed)
Procedure Name: Intubation Date/Time: 07/13/2021 8:23 AM Performed by: Eligha Bridegroom, CRNA Pre-anesthesia Checklist: Patient identified, Emergency Drugs available, Suction available, Patient being monitored and Timeout performed Patient Re-evaluated:Patient Re-evaluated prior to induction Oxygen Delivery Method: Circle system utilized Preoxygenation: Pre-oxygenation with 100% oxygen Induction Type: IV induction Ventilation: Mask ventilation without difficulty and Oral airway inserted - appropriate to patient size Laryngoscope Size: Mac and 4 Grade View: Grade I Tube type: Oral Tube size: 8.0 mm Number of attempts: 1 Airway Equipment and Method: Stylet Secured at: 22 cm Tube secured with: Tape Dental Injury: Teeth and Oropharynx as per pre-operative assessment

## 2021-07-13 NOTE — Op Note (Signed)
NAMEBURTON, GAHAN MEDICAL RECORD NO: [id number] ACCOUNT NO: [id number] DATE OF BIRTH: Apr 11, 1986 FACILITY: [facility] LOCATION: [id number] PHYSICIAN: Lubertha Basque. Jerl Santos, MD  Operative Report   DATE OF PROCEDURE: 07/13/2021  PREOPERATIVE DIAGNOSIS:  Right arm infection.  POSTOPERATIVE DIAGNOSIS:  Right arm infection.  PROCEDURE: 1.  Right arm I and D. 2.  Right arm replacement VAC system.  ANESTHESIA:  General.  ATTENDING SURGEON:  Lubertha Basque. Jerl Santos, MD  ASSISTANT:  Elodia Florence, PA  INDICATION FOR PROCEDURE:  The patient is a 36 year old man with a recent history of severe right arm infection including necrotizing fasciitis.  He has had an extensive debridement with removal of devitalized tissue several days ago and has been managed  on IV antibiotics with a continuous VAC system as well.  He is taken to the operating room today for a planned repeat I and D and replacement of VAC.  Informed operative consent was obtained after discussion of possible complications including reaction  to anesthesia, continued infection, and neurovascular injury.  SUMMARY OF FINDINGS AND PROCEDURE:  Under general anesthesia, we simply removed the old VAC system.  We then performed a thorough irrigation and debridement with some sharp debridement along with irrigation with pulsatile lavage.  We then replaced the  [facility] system.  DESCRIPTION OF PROCEDURE:  The patient was taken to the operating suite where general anesthetic was applied without difficulty.  He was positioned in sloppy lateral position on a beanbag.  Axillary roll was placed and all bony prominences were  appropriately padded.  He was then prepped and draped in normal sterile fashion.  After the administration of his scheduled antibiotics, and an appropriate timeout, we addressed his arm with thorough irrigation and debridement.  He had minimal areas of  necrosis and devitalized tissues.  There was minimal pus.  All of this tissues appeared very  healthy and some significant granulation was beginning.  We irrigated with several liters of sterile solution followed by placement of another VAC system.  Once  the suction was applied, we had a good air seal.  Estimated blood loss and intraoperative fluid was obtained from anesthesia records.  DISPOSITION:  The patient was extubated in the operating room and taken to recovery room in stable condition.  PLAN:  For him to be admitted back to the medicine service for appropriate IV antibiotics and Plastic Surgery consultation.   NIK D: 07/13/2021 9:37:53 am T: 07/13/2021 8:45:00 pm  JOB: [id number]/ [id number]

## 2021-07-13 NOTE — Anesthesia Postprocedure Evaluation (Signed)
Anesthesia Post Note  Patient: Justin Fisher  Procedure(s) Performed: IRRIGATION AND DEBRIDEMENT UPPER EXTREMITY (Right: Arm Upper)     Patient location during evaluation: PACU Anesthesia Type: General Level of consciousness: awake and alert Pain management: pain level controlled Vital Signs Assessment: post-procedure vital signs reviewed and stable Respiratory status: spontaneous breathing, nonlabored ventilation and respiratory function stable Cardiovascular status: blood pressure returned to baseline and stable Postop Assessment: no apparent nausea or vomiting Anesthetic complications: no   No notable events documented.  Last Vitals:  Vitals:   07/13/21 1002 07/13/21 1017  BP: (!) 174/74 (!) 178/65  Pulse: 64 (!) 51  Resp: 16 15  Temp:    SpO2: 100% 99%    Last Pain:  Vitals:   07/13/21 1017  TempSrc:   PainSc: 7                  Shyniece Scripter,W. EDMOND

## 2021-07-13 NOTE — Brief Op Note (Signed)
Marin Milley [id number] 07/13/2021   PRE-OP DIAGNOSIS: right arm infection  POST-OP DIAGNOSIS: same  PROCEDURE: right arm repeat I&D and VAC placement  ANESTHESIA: general  Velna Ochs   Dictation #:  [id number]

## 2021-07-14 ENCOUNTER — Encounter (HOSPITAL_COMMUNITY): Payer: Self-pay | Admitting: Orthopaedic Surgery

## 2021-07-14 DIAGNOSIS — F39 Unspecified mood [affective] disorder: Secondary | ICD-10-CM

## 2021-07-14 DIAGNOSIS — F1990 Other psychoactive substance use, unspecified, uncomplicated: Secondary | ICD-10-CM

## 2021-07-14 DIAGNOSIS — F1120 Opioid dependence, uncomplicated: Secondary | ICD-10-CM | POA: Diagnosis not present

## 2021-07-14 DIAGNOSIS — M86121 Other acute osteomyelitis, right humerus: Secondary | ICD-10-CM

## 2021-07-14 DIAGNOSIS — A408 Other streptococcal sepsis: Principal | ICD-10-CM

## 2021-07-14 DIAGNOSIS — D62 Acute posthemorrhagic anemia: Secondary | ICD-10-CM

## 2021-07-14 DIAGNOSIS — D509 Iron deficiency anemia, unspecified: Secondary | ICD-10-CM

## 2021-07-14 DIAGNOSIS — I10 Essential (primary) hypertension: Secondary | ICD-10-CM

## 2021-07-14 DIAGNOSIS — L03113 Cellulitis of right upper limb: Secondary | ICD-10-CM | POA: Diagnosis not present

## 2021-07-14 DIAGNOSIS — E871 Hypo-osmolality and hyponatremia: Secondary | ICD-10-CM | POA: Diagnosis not present

## 2021-07-14 LAB — TYPE AND SCREEN
ABO/RH(D): B POS
Antibody Screen: NEGATIVE
Unit division: 0
Unit division: 0
Unit division: 0
Unit division: 0

## 2021-07-14 LAB — BPAM RBC
Blood Product Expiration Date: 202301222359
Blood Product Expiration Date: 202301222359
Blood Product Expiration Date: 202301222359
Blood Product Expiration Date: 202301222359
ISSUE DATE / TIME: 202301080747
ISSUE DATE / TIME: 202301080747
ISSUE DATE / TIME: 202301082010
ISSUE DATE / TIME: 202301082012
Unit Type and Rh: 7300
Unit Type and Rh: 7300
Unit Type and Rh: 7300
Unit Type and Rh: 7300

## 2021-07-14 LAB — CBC
HCT: 32.9 % — ABNORMAL LOW (ref 39.0–52.0)
Hemoglobin: 10.4 g/dL — ABNORMAL LOW (ref 13.0–17.0)
MCH: 25.2 pg — ABNORMAL LOW (ref 26.0–34.0)
MCHC: 31.6 g/dL (ref 30.0–36.0)
MCV: 79.7 fL — ABNORMAL LOW (ref 80.0–100.0)
Platelets: 530 10*3/uL — ABNORMAL HIGH (ref 150–400)
RBC: 4.13 MIL/uL — ABNORMAL LOW (ref 4.22–5.81)
RDW: 19.8 % — ABNORMAL HIGH (ref 11.5–15.5)
WBC: 16.8 10*3/uL — ABNORMAL HIGH (ref 4.0–10.5)
nRBC: 0 % (ref 0.0–0.2)

## 2021-07-14 LAB — BASIC METABOLIC PANEL
Anion gap: 10 (ref 5–15)
BUN: 15 mg/dL (ref 6–20)
CO2: 25 mmol/L (ref 22–32)
Calcium: 7.9 mg/dL — ABNORMAL LOW (ref 8.9–10.3)
Chloride: 106 mmol/L (ref 98–111)
Creatinine, Ser: 0.57 mg/dL — ABNORMAL LOW (ref 0.61–1.24)
GFR, Estimated: >60 mL/min (ref >60)
Glucose, Bld: 117 mg/dL — ABNORMAL HIGH (ref 70–99)
Potassium: 3.5 mmol/L (ref 3.5–5.1)
Sodium: 141 mmol/L (ref 135–145)

## 2021-07-14 LAB — CULTURE, BLOOD (ROUTINE X 2)
Culture: NO GROWTH
Culture: NO GROWTH
Special Requests: ADEQUATE
Special Requests: ADEQUATE

## 2021-07-14 LAB — SEDIMENTATION RATE: Sed Rate: 20 mm/hr — ABNORMAL HIGH (ref 0–16)

## 2021-07-14 LAB — C-REACTIVE PROTEIN: CRP: 1.6 mg/dL — ABNORMAL HIGH (ref <1.0)

## 2021-07-14 MED ORDER — HYDRALAZINE HCL 25 MG PO TABS: 25.0000 mg | ORAL_TABLET | Freq: Four times a day (QID) | ORAL | Status: DC | PRN

## 2021-07-14 MED ORDER — SODIUM CHLORIDE 0.9 % IV SOLN: 3.0000 g | Freq: Four times a day (QID) | INTRAVENOUS | Status: DC

## 2021-07-14 MED ORDER — SODIUM CHLORIDE 0.9 % IV SOLN
3.0000 g | Freq: Four times a day (QID) | INTRAVENOUS | Status: AC
  Administered 2021-07-14 – 2021-07-26 (×49): 3 g via INTRAVENOUS
  Filled 2021-07-14 (×50): qty 8

## 2021-07-14 NOTE — Plan of Care (Signed)
Problem: Education: °Goal: Knowledge of General Education information will improve °Description: Including pain rating scale, medication(s)/side effects and non-pharmacologic comfort measures °Outcome: Completed/Met °  °Problem: Clinical Measurements: °Goal: Ability to maintain clinical measurements within normal limits will improve °Outcome: Completed/Met °Goal: Will remain free from infection °Outcome: Completed/Met °Goal: Diagnostic test results will improve °Outcome: Completed/Met °Goal: Respiratory complications will improve °Outcome: Completed/Met °Goal: Cardiovascular complication will be avoided °Outcome: Completed/Met °  °Problem: Activity: °Goal: Risk for activity intolerance will decrease °Outcome: Completed/Met °  °Problem: Nutrition: °Goal: Adequate nutrition will be maintained °Outcome: Completed/Met °  °Problem: Coping: °Goal: Level of anxiety will decrease °Outcome: Completed/Met °  °Problem: Elimination: °Goal: Will not experience complications related to bowel motility °Outcome: Completed/Met °Goal: Will not experience complications related to urinary retention °Outcome: Completed/Met °  °Problem: Pain Managment: °Goal: General experience of comfort will improve °Outcome: Completed/Met °  °Problem: Safety: °Goal: Ability to remain free from injury will improve °Outcome: Completed/Met °  °

## 2021-07-14 NOTE — Progress Notes (Signed)
PROGRESS NOTE  Lain Tetterton DYJ:[id number] DOB: 12/09/1985   PCP: Patient, No Pcp Per (Inactive)  Patient is from: Home  DOA: 07/09/2021 LOS: 5  Chief complaints:  Chief Complaint  Patient presents with   Arm Injury    Right arm      Brief Narrative / Interim history: 36 year old M with PMH of IVDU (heroin, fentanyl and meth), anxiety, HLD, suicidal ideation and tobacco use disorder presenting to ED on 07/09/2021 with progressive severe right upper extremity pain, swelling, oozing brown and foul-smelling liquid after injecting IV drugs using a needle with blood in it, and admitted for sepsis due to right upper arm cellulitis/neck fasciitis/abscess/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 orthopedic surgery and ID consulted.  Initially started on vancomycin, Zosyn and clindamycin.  He underwent I&D with VAC placement on 1/5 and 1/8.  Blood cultures NGTD.  Surgical culture with group F Streptococcus.  Antibiotics de-escalated to IV Zosyn.  ID, orthopedic surgery and plastic surgery following.  Hospital course complicated by acute blood loss anemia related to surgery.  He had about 2 L blood transfused from 1/8-1/9.  Subjective: Seen and examined earlier this morning.  He reports 8/10 pain in right upper extremity.  He describes the pain as burning.  He denies numbness or tingling.  He denies further swelling.  No other complaints.  Objective: Vitals:   07/14/21 0519 07/14/21 0744 07/14/21 1214 07/14/21 1528  BP: (!) 193/80 (!) 187/76 (!) 194/80 (!) 196/83  Pulse: 70 62 84 84  Resp: 20 20 20 20   Temp: 98.5 F (36.9 C) 98 F (36.7 C) 98 F (36.7 C) 98.2 F (36.8 C)  TempSrc: Axillary Oral Oral Oral  SpO2: 98% 98% 99% 99%  Weight:      Height:        Examination:  GENERAL: No apparent distress.  Nontoxic. HEENT: MMM.  Vision and hearing grossly intact.  NECK: Supple.  No apparent JVD.  RESP: 99% on RA.  No IWOB.  Fair aeration bilaterally. CVS:  RRR. Heart sounds normal.   ABD/GI/GU: BS+. Abd soft, NTND.  MSK/EXT:  Moves extremities.  Wound VAC over right upper extremity.  Notable swelling in right upper extremity.  Neurovascular intact. SKIN: Wound VAC over right upper extremity NEURO: Awake, alert and oriented appropriately.  No apparent focal neuro deficit. PSYCH: Calm. Normal affect.   Procedures:  1/5 and 1/8 I&D with wound VAC placement of RUE  Microbiology summarized: 1/4-COVID-19 and influenza PCR nonreactive. 1/4-superficial wound culture with moderate Streptococcus group F 1/4-blood cultures NGTD in all 4 bottles 1/5-MRSA PCR screen negative 1/5-surgical tissue culture with moderate Streptococcus group F 1/5-tissue fungal culture pending   Assessment & Plan: Right upper arm cellulitis/neck fasciitis/acute osteomyelitis and patient with history of IVDU-CRP 1.6.  ESR 20. -S/p I&D on 1/5 and 1/8 with wound VAC placement -Orthopedic surgery, infectious disease and plastic surgery following. -IV Vanco, Zosyn and clindamycin>> IV Zosyn>> IV Unasyn -Pain control per surgery  Polysubstance use/IVDU including heroin, cocaine, fentanyl, meth and tobacco-UDS positive for cocaine on admission.  HIV nonreactive.  Hepatitis C antibody positive. -Encourage cessation. -TOC consulted for resources -Continue nicotine patch  Acute blood loss anemia superimposed on chronic iron deficiency anemia-Hgb 15.5 in 2018.  It seems he has received about 2 L of blood on 1/8.  Anemia panel with some degree of iron deficiency Recent Labs    07/09/21 1900 07/10/21 0254 07/10/21 1559 07/11/21 0224 07/12/21 0648 07/13/21 0325 07/14/21 0323  HGB 7.5* 7.2* 10.2* 7.4*  7.5* 6.3* 10.4*  -Continue monitoring  Uncontrolled hypertension: Not on medication at home. -Discontinue IV fluid -Hydralazine as needed  Hyponatremia: Resolved  Hypokalemia: Resolved  History of anxiety/depression: Stable. -Continue home medications  Leukocytosis/bandemia/thrombocytosis:  Likely reactive due to #1. -Continue monitoring  Hepatitis C antibody positive -Follow quantitative RNA  Body mass index is 25.34 kg/m. Nutrition Problem: Moderate Malnutrition Etiology: social / environmental circumstances (IV drug use) Signs/Symptoms: mild fat depletion, moderate muscle depletion Interventions: Ensure Enlive (each supplement provides 350kcal and 20 grams of protein), Juven, MVI   DVT prophylaxis:  SCDs Start: 07/13/21 1055 SCDs Start: 07/11/21 0832 SCDs Start: 07/09/21 2310  Code Status: Full code Family Communication: Patient and/or RN. Available if any question.  Level of care: Progressive Status is: Inpatient  Remains inpatient appropriate because: Need for IV antibiotics and hemodynamic instability.        Consultants:  Orthopedic surgeon Plastic surgeon Infectious disease   Sch Meds:  Scheduled Meds:  Chlorhexidine Gluconate Cloth  6 each Topical Q0600   docusate sodium  100 mg Oral BID   feeding supplement  237 mL Oral BID BM   gabapentin  400 mg Oral TID   mirtazapine  15 mg Oral QHS   multivitamin with minerals  1 tablet Oral Daily   mupirocin ointment  1 application Nasal BID   sertraline  200 mg Oral Daily   traZODone  100 mg Oral QHS   Continuous Infusions:  sodium chloride 75 mL/hr at 07/13/21 2243   ampicillin-sulbactam (UNASYN) IV 3 g (07/14/21 1500)   PRN Meds:.acetaminophen **OR** acetaminophen, albuterol, HYDROmorphone (DILAUDID) injection, hydrOXYzine, metoCLOPramide **OR** metoCLOPramide (REGLAN) injection, nicotine polacrilex, ondansetron **OR** ondansetron (ZOFRAN) IV, oxyCODONE-acetaminophen  Antimicrobials: Anti-infectives (From admission, onward)    Start     Dose/Rate Route Frequency Ordered Stop   07/14/21 1400  Ampicillin-Sulbactam (UNASYN) 3 g in sodium chloride 0.9 % 100 mL IVPB        3 g 200 mL/hr over 30 Minutes Intravenous Every 6 hours 07/14/21 1106     07/14/21 1015  Ampicillin-Sulbactam (UNASYN) 3 g in  sodium chloride 0.9 % 100 mL IVPB  Status:  Discontinued        3 g 200 mL/hr over 30 Minutes Intravenous Every 6 hours 07/14/21 0924 07/14/21 1106   07/11/21 0600  ceFAZolin (ANCEF) IVPB 2g/100 mL premix  Status:  Discontinued        2 g 200 mL/hr over 30 Minutes Intravenous On call to O.R. 07/10/21 1347 07/10/21 1352   07/10/21 1445  ceFAZolin (ANCEF) IVPB 2g/100 mL premix        2 g 200 mL/hr over 30 Minutes Intravenous On call to O.R. 07/10/21 1352 07/10/21 1636   07/10/21 1300  clindamycin (CLEOCIN) IVPB 900 mg  Status:  Discontinued        900 mg 100 mL/hr over 30 Minutes Intravenous Every 8 hours 07/10/21 0018 07/13/21 1049   07/10/21 1000  vancomycin (VANCOREADY) IVPB 1250 mg/250 mL  Status:  Discontinued        1,250 mg 166.7 mL/hr over 90 Minutes Intravenous Every 12 hours 07/09/21 2121 07/12/21 1333   07/10/21 1000  piperacillin-tazobactam (ZOSYN) IVPB 3.375 g  Status:  Discontinued        3.375 g 12.5 mL/hr over 240 Minutes Intravenous Every 8 hours 07/10/21 0912 07/14/21 0924   07/09/21 2200  piperacillin-tazobactam (ZOSYN) IVPB 3.375 g  Status:  Discontinued        3.375 g 12.5 mL/hr over 240 Minutes  Intravenous Every 8 hours 07/09/21 1958 07/10/21 0912   07/09/21 2000  vancomycin (VANCOREADY) IVPB 1500 mg/300 mL        1,500 mg 150 mL/hr over 120 Minutes Intravenous  Once 07/09/21 1956 07/10/21 0043   07/09/21 1845  vancomycin (VANCOCIN) IVPB 1000 mg/200 mL premix  Status:  Discontinued        1,000 mg 200 mL/hr over 60 Minutes Intravenous  Once 07/09/21 1843 07/09/21 2121        I have personally reviewed the following labs and images: CBC: Recent Labs  Lab 07/09/21 1900 07/10/21 0254 07/10/21 1559 07/11/21 0224 07/12/21 0648 07/13/21 0325 07/14/21 0323  WBC 21.1* 15.7*  --  14.7* 14.7* 16.0* 16.8*  NEUTROABS 18.4* 12.6*  --  9.1* 6.5 7.4  --   HGB 7.5* 7.2* 10.2* 7.4* 7.5* 6.3* 10.4*  HCT 24.2* 24.7* 30.0* 23.7* 24.1* 20.4* 32.9*  MCV 70.8* 73.5*  --   72.9* 73.7* 76.1* 79.7*  PLT 600* 543*  --  548* 541* 474* 530*   BMP &GFR Recent Labs  Lab 07/09/21 1900 07/10/21 0254 07/10/21 1559 07/11/21 0224 07/12/21 0648 07/13/21 0325 07/14/21 0323  NA 131* 129* 135 134* 132* 137 141  K 2.4* 2.7* 3.4* 3.1* 3.3* 3.7 3.5  CL 100 103 102 103 104 107 106  CO2 21* 20*  --  23 24 25 25   GLUCOSE 114* 123* 108* 153* 141* 126* 117*  BUN 16 15 10 10 8 12 15   CREATININE 0.64 0.63 0.30* 0.62 0.42* 0.56* 0.57*  CALCIUM 8.2* 8.0*  --  7.6* 7.7* 7.9* 7.9*  MG 1.9 1.7  --   --   --   --   --   PHOS 4.6 3.1  --   --   --   --   --    Estimated Creatinine Clearance: 118.2 mL/min (A) (by C-G formula based on SCr of 0.57 mg/dL (L)). Liver & Pancreas: Recent Labs  Lab 07/09/21 1900 07/10/21 0254  AST 20   21 18   ALT 16   18 15   ALKPHOS 91   81 78  BILITOT 0.8   0.6 0.5  PROT 6.6   6.4* 6.0*  ALBUMIN 1.9*   2.0* 1.9*   No results for input(s): LIPASE, AMYLASE in the last 168 hours. No results for input(s): AMMONIA in the last 168 hours. Diabetic: No results for input(s): HGBA1C in the last 72 hours. No results for input(s): GLUCAP in the last 168 hours. Cardiac Enzymes: Recent Labs  Lab 07/09/21 1900  CKTOTAL 15*   No results for input(s): PROBNP in the last 8760 hours. Coagulation Profile: No results for input(s): INR, PROTIME in the last 168 hours. Thyroid Function Tests: No results for input(s): TSH, T4TOTAL, FREET4, T3FREE, THYROIDAB in the last 72 hours. Lipid Profile: No results for input(s): CHOL, HDL, LDLCALC, TRIG, CHOLHDL, LDLDIRECT in the last 72 hours. Anemia Panel: Recent Labs    07/13/21 0325  FERRITIN 215  TIBC 343  IRON 55   Urine analysis:    Component Value Date/Time   COLORURINE YELLOW 06/13/2016 2352   APPEARANCEUR CLEAR 06/13/2016 2352   LABSPEC 1.010 06/13/2016 2352   PHURINE 5.0 06/13/2016 2352   GLUCOSEU NEGATIVE 06/13/2016 2352   HGBUR NEGATIVE 06/13/2016 2352   BILIRUBINUR NEGATIVE 06/13/2016 2352    KETONESUR NEGATIVE 06/13/2016 2352   PROTEINUR NEGATIVE 06/13/2016 2352   NITRITE NEGATIVE 06/13/2016 2352   LEUKOCYTESUR NEGATIVE 06/13/2016 2352   Sepsis Labs: Invalid input(s): PROCALCITONIN,  [place]  Microbiology: Recent Results (from the past 240 hour(s))  Blood culture (routine x 2)     Status: None   Collection Time: 07/09/21  2:52 PM   Specimen: BLOOD LEFT ARM  Result Value Ref Range Status   Specimen Description BLOOD LEFT ARM  Final   Special Requests   Final    BOTTLES DRAWN AEROBIC AND ANAEROBIC Blood Culture adequate volume   Culture   Final    NO GROWTH 5 DAYS Performed at [facility], [address] [place], [place] [address]    Report Status 07/14/2021 FINAL  Final  Aerobic Culture w Gram Stain (superficial specimen)     Status: None   Collection Time: 07/09/21  6:22 PM   Specimen: Abscess  Result Value Ref Range Status   Specimen Description ABSCESS  Final   Special Requests Normal  Final   Gram Stain   Final    ABUNDANT WBC PRESENT,BOTH PMN AND MONONUCLEAR MODERATE GRAM VARIABLE ROD MODERATE GRAM POSITIVE COCCI    Culture   Final    MODERATE STREPTOCOCCUS GROUP F Beta hemolytic streptococci are predictably susceptible to penicillin and other beta lactams. Susceptibility testing not routinely performed. Performed at [facility], [place] [address], [place], [place] [address]    Report Status 07/11/2021 FINAL  Final  Resp Panel by RT-PCR (Flu A&B, Covid)     Status: None   Collection Time: 07/09/21  6:33 PM   Specimen: Nasopharyngeal(NP) swabs in vial transport medium  Result Value Ref Range Status   SARS Coronavirus 2 by RT PCR NEGATIVE NEGATIVE Final    Comment: (NOTE) SARS-CoV-2 target nucleic acids are NOT DETECTED.  The SARS-CoV-2 RNA is generally detectable in upper respiratory specimens during the acute phase of infection. The lowest concentration of SARS-CoV-2 viral copies this assay can detect is 138 copies/mL. A negative  result does not preclude SARS-Cov-2 infection and should not be used as the sole basis for treatment or other patient management decisions. A negative result may occur with  improper specimen collection/handling, submission of specimen other than nasopharyngeal swab, presence of viral mutation(s) within the areas targeted by this assay, and inadequate number of viral copies(<138 copies/mL). A negative result must be combined with clinical observations, patient history, and epidemiological information. The expected result is Negative.  Fact Sheet for Patients:  EntrepreneurPulse.com.au  Fact Sheet for Healthcare Providers:  IncredibleEmployment.be  This test is no t yet approved or cleared by the [place] FDA and  has been authorized for detection and/or diagnosis of SARS-CoV-2 by FDA under an Emergency Use Authorization (EUA). This EUA will remain  in effect (meaning this test can be used) for the duration of the COVID-19 declaration under Section 564(b)(1) of the Act, 21 U.S.C.section 360bbb-3(b)(1), unless the authorization is terminated  or revoked sooner.       Influenza A by PCR NEGATIVE NEGATIVE Final   Influenza B by PCR NEGATIVE NEGATIVE Final    Comment: (NOTE) The Xpert Xpress SARS-CoV-2/FLU/RSV plus assay is intended as an aid in the diagnosis of influenza from Nasopharyngeal swab specimens and should not be used as a sole basis for treatment. Nasal washings and aspirates are unacceptable for Xpert Xpress SARS-CoV-2/FLU/RSV testing.  Fact Sheet for Patients: EntrepreneurPulse.com.au  Fact Sheet for Healthcare Providers: IncredibleEmployment.be  This test is not yet approved or cleared by the [place] FDA and has been authorized for detection and/or diagnosis of SARS-CoV-2 by FDA under an Emergency Use Authorization (EUA). This  EUA will remain in effect (meaning this test can be used)  for the duration of the COVID-19 declaration under Section 564(b)(1) of the Act, 21 U.S.C. section 360bbb-3(b)(1), unless the authorization is terminated or revoked.  Performed at [facility], [place] [address], [place], [place] [address]   Blood culture (routine x 2)     Status: None   Collection Time: 07/09/21  7:00 PM   Specimen: BLOOD  Result Value Ref Range Status   Specimen Description BLOOD LEFT ANTECUBITAL  Final   Special Requests   Final    BOTTLES DRAWN AEROBIC AND ANAEROBIC Blood Culture adequate volume   Culture   Final    NO GROWTH 5 DAYS Performed at [facility], [place] [address], [place], [place] [address]    Report Status 07/14/2021 FINAL  Final  Surgical pcr screen     Status: Abnormal   Collection Time: 07/10/21  2:09 PM   Specimen: Nasal Mucosa; Nasal Swab  Result Value Ref Range Status   MRSA, PCR NEGATIVE NEGATIVE Final   Staphylococcus aureus POSITIVE (A) NEGATIVE Final    Comment: (NOTE) The Xpert SA Assay (FDA approved for NASAL specimens in patients 15 years of age and older), is one component of a comprehensive surveillance program. It is not intended to diagnose infection nor to guide or monitor treatment. Performed at [facility], [place] [address] [place], [place] [address]   Fungus Culture With Stain     Status: None (Preliminary result)   Collection Time: 07/10/21  5:26 PM   Specimen: Arm, Right; Tissue  Result Value Ref Range Status   Fungus Stain Final report  Final    Comment: (NOTE) Performed At: [facility] [place], [place] [id number] Rush Farmer MD [id number]    Fungus (Mycology) Culture PENDING  Incomplete   Fungal Source WOUND  Final    Comment: RIGHT ARM Performed at [facility], [place] [address], [place], [place] [address]   Aerobic/Anaerobic Culture w Gram Stain (surgical/deep wound)     Status: None (Preliminary result)   Collection Time: 07/10/21  5:26 PM   Specimen: Arm,  Right; Tissue  Result Value Ref Range Status   Specimen Description WOUND RIGHT ARM  Final   Special Requests PT ON ANCEF,CLINDAMYCIN,TXA,VANCOMYCIN,ZOSYN  Final   Gram Stain   Final    MODERATE WBC PRESENT,BOTH PMN AND MONONUCLEAR ABUNDANT GRAM NEGATIVE RODS ABUNDANT GRAM POSITIVE RODS RARE GRAM POSITIVE COCCI IN PAIRS Performed at [facility], [place] [address], [place], [place] [address]    Culture   Final    MODERATE STREPTOCOCCUS GROUP F Beta hemolytic streptococci are predictably susceptible to penicillin and other beta lactams. Susceptibility testing not routinely performed. NO ANAEROBES ISOLATED; CULTURE IN PROGRESS FOR 5 DAYS    Report Status PENDING  Incomplete  Fungus Culture Result     Status: None   Collection Time: 07/10/21  5:26 PM  Result Value Ref Range Status   Result 1 Comment  Final    Comment: (NOTE) KOH/Calcofluor preparation:  no fungus observed. Performed At: [facility] [place], [place] [id number] Rush Farmer MD [id number]   Aerobic/Anaerobic Culture w Gram Stain (surgical/deep wound)     Status: None (Preliminary result)   Collection Time: 07/10/21  5:29 PM   Specimen: Arm, Right; Tissue  Result Value Ref Range Status   Specimen Description TISSUE RIGHT ARM  Final   Special Requests POSTERIOR PT ON  ANCEF,CLINDAMYCIN,TXA,VANC,ZOSYN  Final   Gram Stain   Final    ABUNDANT WBC PRESENT,BOTH PMN AND MONONUCLEAR ABUNDANT GRAM NEGATIVE RODS ABUNDANT GRAM POSITIVE RODS FEW GRAM POSITIVE COCCI IN PAIRS Performed at [facility], [place] [address], [place], [place] [address]    Culture   Final    ABUNDANT STREPTOCOCCUS GROUP F Beta hemolytic streptococci are predictably susceptible to penicillin and other beta lactams. Susceptibility testing not routinely performed. RARE STAPHYLOCOCCUS EPIDERMIDIS NO ANAEROBES ISOLATED; CULTURE IN PROGRESS FOR 5 DAYS    Report Status PENDING  Incomplete   Organism ID, Bacteria  STAPHYLOCOCCUS EPIDERMIDIS  Final      Susceptibility   Staphylococcus epidermidis - MIC*    CIPROFLOXACIN <=0.5 SENSITIVE Sensitive     ERYTHROMYCIN >=8 RESISTANT Resistant     GENTAMICIN <=0.5 SENSITIVE Sensitive     OXACILLIN <=0.25 SENSITIVE Sensitive     TETRACYCLINE <=1 SENSITIVE Sensitive     VANCOMYCIN 1 SENSITIVE Sensitive     TRIMETH/SULFA 160 RESISTANT Resistant     CLINDAMYCIN <=0.25 SENSITIVE Sensitive     RIFAMPIN <=0.5 SENSITIVE Sensitive     Inducible Clindamycin NEGATIVE Sensitive     * RARE STAPHYLOCOCCUS EPIDERMIDIS  Fungus Culture With Stain     Status: None (Preliminary result)   Collection Time: 07/10/21  5:35 PM   Specimen: PATH Other; Wound  Result Value Ref Range Status   Fungus Stain Final report  Final    Comment: (NOTE) Performed At: [facility] [place], [place] [id number] Rush Farmer MD [id number]    Fungus (Mycology) Culture PENDING  Incomplete   Fungal Source TISSUE  Final    Comment: RIGHT ARM LATERAL Performed at [facility], [place] [address], [place], [place] [address]   Aerobic/Anaerobic Culture w Gram Stain (surgical/deep wound)     Status: None (Preliminary result)   Collection Time: 07/10/21  5:35 PM   Specimen: PATH Other; Wound  Result Value Ref Range Status   Specimen Description TISSUE RIGHT ARM  Final   Special Requests LATERAL SAMPLE B  Final   Gram Stain   Final    RARE WBC PRESENT, PREDOMINANTLY MONONUCLEAR ABUNDANT GRAM NEGATIVE RODS ABUNDANT GRAM POSITIVE RODS RARE GRAM POSITIVE COCCI IN PAIRS Performed at [facility], [place] [address] [place], [place] [address]    Culture   Final    ABUNDANT STREPTOCOCCUS GROUP F Beta hemolytic streptococci are predictably susceptible to penicillin and other beta lactams. Susceptibility testing not routinely performed. RARE STAPHYLOCOCCUS HOMINIS NO ANAEROBES ISOLATED; CULTURE IN PROGRESS FOR 5 DAYS    Report Status PENDING  Incomplete    Organism ID, Bacteria STAPHYLOCOCCUS HOMINIS  Final      Susceptibility   Staphylococcus hominis - MIC*    CIPROFLOXACIN <=0.5 SENSITIVE Sensitive     ERYTHROMYCIN >=8 RESISTANT Resistant     GENTAMICIN <=0.5 SENSITIVE Sensitive     OXACILLIN <=0.25 SENSITIVE Sensitive     TETRACYCLINE <=1 SENSITIVE Sensitive     VANCOMYCIN <=0.5 SENSITIVE Sensitive     TRIMETH/SULFA <=10 SENSITIVE Sensitive     CLINDAMYCIN <=0.25 SENSITIVE Sensitive     RIFAMPIN <=0.5 SENSITIVE Sensitive     Inducible Clindamycin NEGATIVE Sensitive     * RARE STAPHYLOCOCCUS HOMINIS  Fungus Culture Result     Status: None   Collection Time: 07/10/21  5:35 PM  Result Value Ref Range Status   Result 1 Comment  Final    Comment: (NOTE) KOH/Calcofluor preparation:  no  fungus observed. Performed At: [facility] [place], [place] [id number] Rush Farmer MD [id number]:[id number]     Radiology Studies: No results found.    Romie Tay T. [place]  If 7PM-7AM, please contact night-coverage [email or web address] 07/14/2021, 3:45 PM

## 2021-07-14 NOTE — Consult Note (Signed)
[facility]    Date of Admission:  07/09/2021   Total days of inpatient antibiotics 2        Reason for Consult: Necrotizing fascitis of right arm    Principal Problem:   Sepsis (HCC) Active Problems:   Heroin use disorder, moderate, dependence (HCC)   Anemia   Cellulitis   Hypokalemia   Hyponatremia   Tobacco abuse   Protein-calorie malnutrition, severe   Assessment: 35YM with IVDA admitted for right arm wound. He had presented with oozing brown liquid. CT showed right arm showed abscesses and osteomyelitis of greater tuberosity. Taken to OR on 1/5 where bone, muscle and fascia were debrided.   # Right arm necrotizing fascitis SP I&D and wound vac placement on 1/5 with Cx + Goup F strep #Acute/subacute Osteomyelitis of right humerus #IVDA with heroin/fentanyl/meth -He reports that he uses tap water to mix drugs prior to injection and using amoxicillin prior to hospitalization as such continue to cover broadly with antibiotics. Reports he has shared needles in the past. Gram stain from OR Cx + Group F strep and staph hominis and epidermidis. Would not cover CoNS as they are commensal skin organisms. Start unasyn, would like to treat with 2 weeks of IV antibiotics from OR(1/8-necrotic tissue debrided) followed by four weeks of PO antiboics(Augmentin)  Recommendations:  -D/C pip-tazo -Start Unasyn -Anticipate 2 weeks of IV antibiotics from OR on 07/13/21 till 07/26/21 then start Augmentin 875/125mg  PO bid (EOT 08/23/21). Not a home IV antibiotics candidate.  -Follow-up in ID clinic -ID will sign off please engage with any questions  #ID labs in the setting of high risk behavior -HIV negative on 07/10/21 -Needs HBV/HAV immunization as he is non immune -Follow-up HCV VL  Microbiology:   Antibiotics: Vancomycin 1/4-p Pip-tazo 1/4-p Clindamycin 1/5-p  Cultures: Blood 1/4 NGTD Other 1/5 OR Cx: Group F strep, staph epidermidis and staph  hominis, GS: +GPC in pairs, GPR, GNR  Subjective:  Afebrile overnight. Laying in bed. No new complaints. Reports arm feels better   Review of Systems: Review of Systems  All other systems reviewed and are negative.  Past Medical History:  Diagnosis Date   Anxiety    Suicidal thoughts     Social History   Tobacco Use   Smoking status: Every Day    Packs/day: 0.50    Types: Cigarettes   Smokeless tobacco: Never  Substance Use Topics   Alcohol use: Yes   Drug use: Yes    Types: IV    Comment: Uses heroin daily as well as xanax, last use 2-3 mths    History reviewed. No pertinent family history. Scheduled Meds:  Chlorhexidine Gluconate Cloth  6 each Topical Q0600   docusate sodium  100 mg Oral BID   feeding supplement  237 mL Oral BID BM   gabapentin  400 mg Oral TID   mirtazapine  15 mg Oral QHS   multivitamin with minerals  1 tablet Oral Daily   mupirocin ointment  1 application Nasal BID   sertraline  200 mg Oral Daily   traZODone  100 mg Oral QHS   Continuous Infusions:  sodium chloride 75 mL/hr at 07/13/21 2243   ampicillin-sulbactam (UNASYN) IV 3 g (07/14/21 1500)   PRN Meds:.acetaminophen **OR** acetaminophen, albuterol, HYDROmorphone (DILAUDID) injection, hydrOXYzine, metoCLOPramide **OR** metoCLOPramide (REGLAN) injection, nicotine polacrilex, ondansetron **OR** ondansetron (ZOFRAN) IV, oxyCODONE-acetaminophen No Known Allergies  OBJECTIVE: Blood pressure (!) 196/83, pulse 84, temperature  98.2 F (36.8 C), temperature source Oral, resp. rate 20, height 5\' 7"  (1.702 m), weight 73.4 kg, SpO2 99 %.  Physical Exam Constitutional:      General: He is not in acute distress.    Appearance: He is normal weight. He is not toxic-appearing.  HENT:     Head: Normocephalic and atraumatic.     Right Ear: External ear normal.     Left Ear: External ear normal.     Nose: No congestion or rhinorrhea.     Mouth/Throat:     Mouth: Mucous membranes are moist.      Pharynx: Oropharynx is clear.  Eyes:     Extraocular Movements: Extraocular movements intact.     Conjunctiva/sclera: Conjunctivae normal.     Pupils: Pupils are equal, round, and reactive to light.  Cardiovascular:     Rate and Rhythm: Normal rate and regular rhythm.     Heart sounds: No murmur heard.   No friction rub. No gallop.  Pulmonary:     Effort: Pulmonary effort is normal.     Breath sounds: Normal breath sounds.  Abdominal:     General: Abdomen is flat. Bowel sounds are normal.     Palpations: Abdomen is soft.  Musculoskeletal:        General: No swelling. Normal range of motion.     Cervical back: Normal range of motion and neck supple.     Comments: Right arm wound vac in place. Swelling has improved  Skin:    General: Skin is warm and dry.  Neurological:     General: No focal deficit present.     Mental Status: He is oriented to person, place, and time.  Psychiatric:        Mood and Affect: Mood normal.    Lab Results Lab Results  Component Value Date   WBC 16.8 (H) 07/14/2021   HGB 10.4 (L) 07/14/2021   HCT 32.9 (L) 07/14/2021   MCV 79.7 (L) 07/14/2021   PLT 530 (H) 07/14/2021    Lab Results  Component Value Date   CREATININE 0.57 (L) 07/14/2021   BUN 15 07/14/2021   NA 141 07/14/2021   K 3.5 07/14/2021   CL 106 07/14/2021   CO2 25 07/14/2021    Lab Results  Component Value Date   ALT 15 07/10/2021   AST 18 07/10/2021   ALKPHOS 78 07/10/2021   BILITOT 0.5 07/10/2021       09/07/2021, MD [facility] 07/14/2021, 4:05 PM

## 2021-07-14 NOTE — Consult Note (Signed)
[facility]  Reason for Consult: Right upper extremity infection/wound Referring Physician: Dr. Eustace Pen. Rhona Raider  Justin Fisher is an 36 y.o. male.  HPI: 36 year old IV drug user who presented to the [facility] long ED on 07/07/2021 for evaluation of his right arm and subsequently eloped prior to full medical work-up.  He subsequently returned to the ED on 07/09/2021.  He reported IV drug use with needle contaminated with blood 5 days prior to initial evaluation.  Orthopedics was consulted and patient subsequently underwent debridement of his right arm, I&D of right arm abscesses and application of negative pressure wound VAC with Dr. Mable Fill on 07/10/2021.  He subsequently returned to the OR on 07/13/2021 with Dr. Rhona Raider for additional I&D and wound VAC change.  Per op note, minimal areas of necrosis and devitalized tissue, minimal pus.  Tissues appeared healthy with significant granulation beginning.  Plastic surgery consulted for assistance with ongoing wound care, coverage  Patient did have a CT scan on 07/10/2021 which showed diffuse subcutaneous soft tissue edema, air identified in the subcutaneous soft tissues along the ventral aspect of the distal humerus and some questionable rim-enhancing structure adjacent to the distal humerus.  Patient is resting in bed today on evaluation, reports she is overall doing okay.  Has significant pain with attempting to elevate his right arm.  He had breakfast this AM.  He denies any fevers, chills.  He reports he is able to use his right hand, but is unable to lift his right arm secondary to pain.   Past Medical History:  Diagnosis Date   Anxiety    Suicidal thoughts     Past Surgical History:  Procedure Laterality Date   APPLICATION OF WOUND VAC Right 07/10/2021   Procedure: APPLICATION OF WOUND VAC;  Surgeon: Georgeanna Harrison, MD;  Location: [place];  Service: Orthopedics;  Laterality: Right;   I & D EXTREMITY Right 07/10/2021    Procedure: IRRIGATION AND DEBRIDEMENT RIGHT UPPER ARM;  Surgeon: Georgeanna Harrison, MD;  Location: [place];  Service: Orthopedics;  Laterality: Right;   I & D EXTREMITY Right 07/13/2021   Procedure: IRRIGATION AND DEBRIDEMENT UPPER EXTREMITY;  Surgeon: Melrose Nakayama, MD;  Location: [place];  Service: Orthopedics;  Laterality: Right;   SHOULDER SURGERY Left     History reviewed. No pertinent family history.  Social History:  reports that he has been smoking cigarettes. He has been smoking an average of .5 packs per day. He has never used smokeless tobacco. He reports current alcohol use. He reports current drug use. Drug: IV.  Allergies: No Known Allergies  Medications: I have reviewed the patient's current medications.  Results for orders placed or performed during the hospital encounter of 07/09/21 (from the past 48 hour(s))  Urine rapid drug screen (hosp performed)     Status: Abnormal   Collection Time: 07/12/21  5:11 PM  Result Value Ref Range   Opiates POSITIVE (A) NONE DETECTED   Cocaine NONE DETECTED NONE DETECTED   Benzodiazepines NONE DETECTED NONE DETECTED   Amphetamines NONE DETECTED NONE DETECTED   Tetrahydrocannabinol NONE DETECTED NONE DETECTED   Barbiturates NONE DETECTED NONE DETECTED    Comment: (NOTE) DRUG SCREEN FOR MEDICAL PURPOSES ONLY.  IF CONFIRMATION IS NEEDED FOR ANY PURPOSE, NOTIFY LAB WITHIN 5 DAYS.  LOWEST DETECTABLE LIMITS FOR URINE DRUG SCREEN Drug Class                     Cutoff (ng/mL) Amphetamine and metabolites  1000 Barbiturate and metabolites    200 Benzodiazepine                 [id number] Tricyclics and metabolites     300 Opiates and metabolites        300 Cocaine and metabolites        300 THC                            50 Performed at [facility], [place] [address] [place], [place] [address]   CBC with Differential/Platelet     Status: Abnormal   Collection Time: 07/13/21  3:25 AM  Result Value Ref Range   WBC 16.0 (H) 4.0 - 10.5 K/uL    RBC 2.68 (L) 4.22 - 5.81 MIL/uL   Hemoglobin 6.3 (LL) 13.0 - 17.0 g/dL    Comment: REPEATED TO VERIFY Reticulocyte Hemoglobin testing may be clinically indicated, consider ordering this additional test [id number] THIS CRITICAL RESULT HAS VERIFIED AND BEEN CALLED TO D. NELSON, RN BY ENIOLA ADEDOKUN ON 01 08 2023 AT 0533, AND HAS BEEN READ BACK.  THIS CRITICAL RESULT HAS VERIFIED AND BEEN CALLED TO D. NELSON, RN BY ENIOLA ADEDOKUN ON 01 08 2023 AT 0534, AND HAS BEEN READ BACK.     HCT 20.4 (L) 39.0 - 52.0 %   MCV 76.1 (L) 80.0 - 100.0 fL   MCH 23.5 (L) 26.0 - 34.0 pg   MCHC 30.9 30.0 - 36.0 g/dL   RDW 19.1 (H) 11.5 - 15.5 %   Platelets 474 (H) 150 - 400 K/uL   nRBC 0.0 0.0 - 0.2 %   Neutrophils Relative % 46 %   Neutro Abs 7.4 1.7 - 7.7 K/uL   Lymphocytes Relative 45 %   Lymphs Abs 7.2 (H) 0.7 - 4.0 K/uL   Monocytes Relative 4 %   Monocytes Absolute 0.6 0.1 - 1.0 K/uL   Eosinophils Relative 2 %   Eosinophils Absolute 0.3 0.0 - 0.5 K/uL   Basophils Relative 1 %   Basophils Absolute 0.2 (H) 0.0 - 0.1 K/uL   WBC Morphology See Note     Comment: Mild Left Shift. 1 to 5% Metas and Myelos, Occ Pro Noted.   nRBC 0 0 /100 WBC   Myelocytes 2 %   Abs Immature Granulocytes 0.30 (H) 0.00 - 0.07 K/uL   Target Cells PRESENT     Comment: Performed at [facility] [address], [place],  [id number]  Basic metabolic panel     Status: Abnormal   Collection Time: 07/13/21  3:25 AM  Result Value Ref Range   Sodium 137 135 - 145 mmol/L   Potassium 3.7 3.5 - 5.1 mmol/L   Chloride 107 98 - 111 mmol/L   CO2 25 22 - 32 mmol/L   Glucose, Bld 126 (H) 70 - 99 mg/dL    Comment: Glucose reference range applies only to samples taken after fasting for at least 8 hours.   BUN 12 6 - 20 mg/dL   Creatinine, Ser 0.56 (L) 0.61 - 1.24 mg/dL   Calcium 7.9 (L) 8.9 - 10.3 mg/dL   GFR, Estimated >60 >60 mL/min    Comment: (NOTE) Calculated using the CKD-EPI Creatinine Equation (2021)    Anion  gap 5 5 - 15    Comment: Performed at [facility] [address] [place], [place] [address]  Iron and TIBC     Status: Abnormal  Collection Time: 07/13/21  3:25 AM  Result Value Ref Range   Iron 55 45 - 182 ug/dL   TIBC 343 250 - 450 ug/dL   Saturation Ratios 16 (L) 17.9 - 39.5 %   UIBC 288 ug/dL    Comment: Performed at [facility] [address], [place], [place] [address]  Ferritin     Status: None   Collection Time: 07/13/21  3:25 AM  Result Value Ref Range   Ferritin 215 24 - 336 ng/mL    Comment: Performed at [facility], [place] [address], [place], [place] [address]  Type and screen Vincent     Status: None (Preliminary result)   Collection Time: 07/13/21  6:22 AM  Result Value Ref Range   ABO/RH(D) B POS    Antibody Screen NEG    Sample Expiration 07/16/2021,2359    Unit Number [id number]    Blood Component Type RED CELLS,LR    Unit division 00    Status of Unit ISSUED    Transfusion Status OK TO TRANSFUSE    Crossmatch Result Compatible    Unit Number [id number]    Blood Component Type RED CELLS,LR    Unit division 00    Status of Unit ISSUED    Transfusion Status OK TO TRANSFUSE    Crossmatch Result      Compatible Performed at [facility], [place] [address], [place], [place] [address]    Unit Number [id number]    Blood Component Type RED CELLS,LR    Unit division 00    Status of Unit ISSUED    Transfusion Status OK TO TRANSFUSE    Crossmatch Result Compatible    Unit Number [id number]    Blood Component Type RED CELLS,LR    Unit division 00    Status of Unit ISSUED    Transfusion Status OK TO TRANSFUSE    Crossmatch Result Compatible   Prepare RBC (crossmatch)     Status: None   Collection Time: 07/13/21  6:22 AM  Result Value Ref Range   Order Confirmation      ORDER PROCESSED BY BLOOD BANK Performed at [facility], [place] [address] [place], [place] [address]   Prepare RBC (crossmatch)      Status: None   Collection Time: 07/13/21  7:30 AM  Result Value Ref Range   Order Confirmation      ORDER PROCESSED BY BLOOD BANK Performed at [facility], [place] [address], [place], [place] [address]   CBC     Status: Abnormal   Collection Time: 07/14/21  3:23 AM  Result Value Ref Range   WBC 16.8 (H) 4.0 - 10.5 K/uL   RBC 4.13 (L) 4.22 - 5.81 MIL/uL   Hemoglobin 10.4 (L) 13.0 - 17.0 g/dL    Comment: REPEATED TO VERIFY POST TRANSFUSION SPECIMEN    HCT 32.9 (L) 39.0 - 52.0 %   MCV 79.7 (L) 80.0 - 100.0 fL   MCH 25.2 (L) 26.0 - 34.0 pg   MCHC 31.6 30.0 - 36.0 g/dL   RDW 19.8 (H) 11.5 - 15.5 %   Platelets 530 (H) 150 - 400 K/uL   nRBC 0.0 0.0 - 0.2 %    Comment: Performed at [facility], [place] [address] [place], [place] [id number]  Basic metabolic panel     Status: Abnormal   Collection Time: 07/14/21  3:23 AM  Result Value Ref Range   Sodium  141 135 - 145 mmol/L   Potassium 3.5 3.5 - 5.1 mmol/L   Chloride 106 98 - 111 mmol/L   CO2 25 22 - 32 mmol/L   Glucose, Bld 117 (H) 70 - 99 mg/dL    Comment: Glucose reference range applies only to samples taken after fasting for at least 8 hours.   BUN 15 6 - 20 mg/dL   Creatinine, Ser 0.57 (L) 0.61 - 1.24 mg/dL   Calcium 7.9 (L) 8.9 - 10.3 mg/dL   GFR, Estimated >60 >60 mL/min    Comment: (NOTE) Calculated using the CKD-EPI Creatinine Equation (2021)    Anion gap 10 5 - 15    Comment: Performed at [facility] [address] [place],  [address]    No results found.  Review of Systems  Constitutional:  Negative for chills and fever.  Respiratory: Negative.    Gastrointestinal: Negative.   Musculoskeletal:  Positive for myalgias.  Blood pressure (!) 187/76, pulse 62, temperature 98 F (36.7 C), temperature source Oral, resp. rate 20, height 5\' 7"  (1.702 m), weight 73.4 kg, SpO2 98 %. Physical Exam Constitutional:      General: He is not in acute distress.    Appearance: He is ill-appearing.   HENT:     Head: Normocephalic and atraumatic.  Cardiovascular:     Pulses: Normal pulses.  Pulmonary:     Effort: Pulmonary effort is normal.  Musculoskeletal:       Arms:     Comments: Pitting edema noted of right upper extremity extending into hands and digits, compartments are soft. I do not appreciate any areas of fluctuance with palpation.  Wound VAC in place with good seal noted, approximately 300 cc of serosanguineous drainage in canister.  Tenderness noted of right upper extremity with passive and active range of motion, exam limited due to patient's pain.  Palpable right radial pulse.  Sensation intact.  Patient exhibits good range of motion of all 5 digits.  Skin:    General: Skin is warm and dry.     Findings: Erythema present.  Neurological:     Mental Status: He is alert.  Psychiatric:        Mood and Affect: Mood normal.        Behavior: Behavior normal.    Assessment/Plan: 36 year old male IV drug user presented to the ED on 07/07/2021 and subsequently eloped prior to receiving full medical work-up, subsequently returned on 07/09/2021 and underwent 2 debridements of his right upper extremity with most recent debridement yesterday 07/13/2021.  Reviewed EMR photos, appears to have good base of granulation tissue.  Discussed patient's case with Dr. Marla Roe.  Will discuss further with orthopedic team for coordination of care.   Carola Rhine Prisila Dlouhy, PA-C 07/14/2021, 10:33 AM

## 2021-07-14 NOTE — Progress Notes (Signed)
Mobility Specialist Progress Note   07/14/21 1540  Mobility  Activity Ambulated in hall  Level of Assistance Standby assist, set-up cues, supervision of patient - no hands on  Assistive Device None  Distance Ambulated (ft) 480 ft  Mobility Ambulated independently in hallway  Mobility Response Tolerated well  Mobility performed by Mobility specialist  $Mobility charge 1 Mobility   Received pt in bed c/o RUE pain(8/10) and requiring max motivation from MS and RN to ambulated, pt then agreed. Focused on activity tolerance throughout session, pt able to mobilize well but limited by pain in RUE. Returned BTB w/ needs met and call bell in reach.    Holland Falling Mobility Specialist Phone Number [phone]

## 2021-07-14 NOTE — Progress Notes (Signed)
Orthopaedics Daily Progress Note   07/14/2021   3:17 PM  Justin Fisher is a 36 y.o. male 1 Day Post-Op s/p IRRIGATION AND DEBRIDEMENT UPPER EXTREMITY  Subjective Denies nausea, vomiting, or fevers. Pain appropriate and expected.  Objective Vitals:   07/14/21 0744 07/14/21 1214  BP: (!) 187/76 (!) 194/80  Pulse: 62 84  Resp: 20 20  Temp: 98 F (36.7 C) 98 F (36.7 C)  SpO2: 98% 99%    Intake/Output Summary (Last 24 hours) at 07/14/2021 1517 Last data filed at 07/14/2021 1217 Gross per 24 hour  Intake 1298.8 ml  Output 1400 ml  Net -101.2 ml    Physical Exam RUE: VAC intact holding suction Forearm remains edematous but without palpable fluctuance +EDC/FDP/APB/DIO SILT M/U/R +Radial and WWP distally  Assessment 36 y.o. male s/p Procedure(s) (LRB): IRRIGATION AND DEBRIDEMENT UPPER EXTREMITY (Right)  Plan Mobility: Out of bed as tolerated Elevate right upper extremity higher than heart Pain control: Continue to wean/titrate to appropriate oral regimen DVT Prophylaxis: Per primary team Return to OR Wednesday 07/14/2021 for debridement and VAC exchange Plastics consulted RUE: Nonweightbearing, limited mobility due to wound vacs LUE: No restrictions RLE: No restrictions LLE: No restrictions Disposition: Pending further surgical treatment Dressing care:  Maintain wound vacs at 125 mmHg low continuous suction; elevate right upper extremity higher than heart    Georgeanna Harrison M.D. Orthopaedic Surgery [facility]

## 2021-07-15 DIAGNOSIS — F1120 Opioid dependence, uncomplicated: Secondary | ICD-10-CM | POA: Diagnosis not present

## 2021-07-15 DIAGNOSIS — L03113 Cellulitis of right upper limb: Secondary | ICD-10-CM | POA: Diagnosis not present

## 2021-07-15 DIAGNOSIS — A408 Other streptococcal sepsis: Secondary | ICD-10-CM | POA: Diagnosis not present

## 2021-07-15 DIAGNOSIS — E871 Hypo-osmolality and hyponatremia: Secondary | ICD-10-CM | POA: Diagnosis not present

## 2021-07-15 LAB — AEROBIC/ANAEROBIC CULTURE W GRAM STAIN (SURGICAL/DEEP WOUND)

## 2021-07-15 LAB — CBC
HCT: 33.1 % — ABNORMAL LOW (ref 39.0–52.0)
Hemoglobin: 10.7 g/dL — ABNORMAL LOW (ref 13.0–17.0)
MCH: 26.2 pg (ref 26.0–34.0)
MCHC: 32.3 g/dL (ref 30.0–36.0)
MCV: 80.9 fL (ref 80.0–100.0)
Platelets: 520 10*3/uL — ABNORMAL HIGH (ref 150–400)
RBC: 4.09 MIL/uL — ABNORMAL LOW (ref 4.22–5.81)
RDW: 21 % — ABNORMAL HIGH (ref 11.5–15.5)
WBC: 14.3 10*3/uL — ABNORMAL HIGH (ref 4.0–10.5)
nRBC: 0 % (ref 0.0–0.2)

## 2021-07-15 LAB — RENAL FUNCTION PANEL
Albumin: 2.2 g/dL — ABNORMAL LOW (ref 3.5–5.0)
Anion gap: 10 (ref 5–15)
BUN: 10 mg/dL (ref 6–20)
CO2: 23 mmol/L (ref 22–32)
Calcium: 8.1 mg/dL — ABNORMAL LOW (ref 8.9–10.3)
Chloride: 107 mmol/L (ref 98–111)
Creatinine, Ser: 0.53 mg/dL — ABNORMAL LOW (ref 0.61–1.24)
GFR, Estimated: >60 mL/min (ref >60)
Glucose, Bld: 114 mg/dL — ABNORMAL HIGH (ref 70–99)
Phosphorus: 3.6 mg/dL (ref 2.5–4.6)
Potassium: 3.6 mmol/L (ref 3.5–5.1)
Sodium: 140 mmol/L (ref 135–145)

## 2021-07-15 LAB — MAGNESIUM: Magnesium: 1.8 mg/dL (ref 1.7–2.4)

## 2021-07-15 LAB — CK: Total CK: 23 U/L — ABNORMAL LOW (ref 49–397)

## 2021-07-15 NOTE — Progress Notes (Signed)
PROGRESS NOTE  Gearald Stonebraker XBL:[id number] DOB: March 06, 1986   PCP: Patient, No Pcp Per (Inactive)  Patient is from: Home  DOA: 07/09/2021 LOS: 6  Chief complaints:  Chief Complaint  Patient presents with   Arm Injury    Right arm      Brief Narrative / Interim history: 36 year old M with PMH of IVDU (heroin, fentanyl and meth), anxiety, HLD, suicidal ideation and tobacco use disorder presenting to ED on 07/09/2021 with progressive severe right upper extremity pain, swelling, oozing brown and foul-smelling liquid after injecting IV drugs using a needle with blood in it, and admitted for sepsis due to right upper arm cellulitis/neck fasciitis/abscess/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 orthopedic surgery and ID consulted.  Initially started on vancomycin, Zosyn and clindamycin.  He underwent I&D with VAC placement on 1/5 and 1/8.  Blood cultures NGTD.  Surgical culture with group F Streptococcus.  Antibiotics de-escalated to IV Zosyn.  ID, orthopedic surgery and plastic surgery following.  Hospital course complicated by acute blood loss anemia related to surgery.  He had about 2 L blood transfused from 1/8-1/9.  Subjective: Seen and examined earlier this morning.  Continues to endorse significant pain in right arm.  Asking if he can only use IV Dilaudid.  He was encouraged to take p.o. medications.  No other complaints.  Objective: Vitals:   07/15/21 0813 07/15/21 0825 07/15/21 1223 07/15/21 1249  BP: (!) 201/82 (!) 144/83 (!) 159/89   Pulse: 73  72 77  Resp: 20  20 20   Temp: 98.4 F (36.9 C)   98.6 F (37 C)  TempSrc: Oral   Oral  SpO2: 98%  99% 98%  Weight:      Height:        Examination:  GENERAL: No apparent distress.  Nontoxic. HEENT: MMM.  Vision and hearing grossly intact.  NECK: Supple.  No apparent JVD.  RESP: 98% on RA.  No IWOB.  Fair aeration bilaterally. CVS:  RRR. Heart sounds normal.  ABD/GI/GU: BS+. Abd soft, NTND.  MSK/EXT:  Moves extremities.   Notable RUE swelling.  Neurovascular intact. SKIN: Wound VAC over right upper extremity below his deltoid. NEURO: Awake and alert. Oriented appropriately.  No apparent focal neuro deficit. PSYCH: Calm. Normal affect.   Procedures:  1/5 and 1/8 I&D with wound VAC placement of RUE  Microbiology summarized: 1/4-COVID-19 and influenza PCR nonreactive. 1/4-superficial wound culture with moderate Streptococcus group F 1/4-blood cultures NGTD in all 4 bottles 1/5-MRSA PCR screen negative 1/5-surgical tissue culture with moderate Streptococcus group F 1/5-tissue fungal culture pending   Assessment & Plan: Right upper arm cellulitis/neck fasciitis/acute osteomyelitis and patient with history of IVDU-CRP 1.6.  ESR 20. -S/p I&D on 1/5 and 1/8 with wound VAC placement -Orthopedic surgery, infectious disease and plastic surgery following. -IV Vanco, Zosyn and clinda>> IV Zosyn>> IV Unasyn until 07/26/2021 then p.o. Augmentin until 08/23/2021 -Pain control per surgery  Polysubstance use/IVDU including heroin, cocaine, fentanyl, meth and tobacco-UDS positive for cocaine on admission.  HIV nonreactive.  Hepatitis C antibody positive. -Encourage cessation. -TOC consulted for resources -Continue nicotine patch -Hep B vaccine  Acute blood loss anemia superimposed on chronic iron deficiency anemia-Hgb 15.5 in 2018.  It seems he has received about 2 L of blood on 1/8.  Anemia panel with some degree of iron deficiency Recent Labs    07/09/21 1900 07/10/21 0254 07/10/21 1559 07/11/21 0224 07/12/21 0648 07/13/21 0325 07/14/21 0323 07/15/21 0245  HGB 7.5* 7.2* 10.2* 7.4* 7.5* 6.3* 10.4*  10.7*  -Continue monitoring  Uncontrolled hypertension: Not on medication at home. -Discontinued IV fluid -Hydralazine as needed  Hyponatremia: Resolved  Hypokalemia: Resolved  History of anxiety/depression: Stable. -Continue home medications  Leukocytosis/bandemia/thrombocytosis: Likely reactive due to  #1. -Continue monitoring  Hepatitis C antibody positive -Follow quantitative RNA  Body mass index is 25.34 kg/m. Nutrition Problem: Moderate Malnutrition Etiology: social / environmental circumstances (IV drug use) Signs/Symptoms: mild fat depletion, moderate muscle depletion Interventions: Ensure Enlive (each supplement provides 350kcal and 20 grams of protein), Juven, MVI   DVT prophylaxis:  SCDs Start: 07/13/21 1055 SCDs Start: 07/11/21 0832 SCDs Start: 07/09/21 2310  Code Status: Full code Family Communication: Patient and/or RN. Available if any question.  Level of care: Progressive.  Change level of care to MedSurg. Status is: Inpatient  Remains inpatient appropriate because: Need for IV antibiotics.  Not a candidate for home IV antibiotics.       Consultants:  Orthopedic surgeon-following Plastic surgeon-following Infectious disease-signed off   Sch Meds:  Scheduled Meds:  Chlorhexidine Gluconate Cloth  6 each Topical Q0600   docusate sodium  100 mg Oral BID   feeding supplement  237 mL Oral BID BM   gabapentin  400 mg Oral TID   mirtazapine  15 mg Oral QHS   multivitamin with minerals  1 tablet Oral Daily   mupirocin ointment  1 application Nasal BID   sertraline  200 mg Oral Daily   traZODone  100 mg Oral QHS   Continuous Infusions:  ampicillin-sulbactam (UNASYN) IV 3 g (07/15/21 1427)   PRN Meds:.acetaminophen **OR** acetaminophen, albuterol, hydrALAZINE, HYDROmorphone (DILAUDID) injection, hydrOXYzine, metoCLOPramide **OR** metoCLOPramide (REGLAN) injection, nicotine polacrilex, ondansetron **OR** ondansetron (ZOFRAN) IV, oxyCODONE-acetaminophen  Antimicrobials: Anti-infectives (From admission, onward)    Start     Dose/Rate Route Frequency Ordered Stop   07/14/21 1400  Ampicillin-Sulbactam (UNASYN) 3 g in sodium chloride 0.9 % 100 mL IVPB        3 g 200 mL/hr over 30 Minutes Intravenous Every 6 hours 07/14/21 1106 07/26/21 2359   07/14/21 1015   Ampicillin-Sulbactam (UNASYN) 3 g in sodium chloride 0.9 % 100 mL IVPB  Status:  Discontinued        3 g 200 mL/hr over 30 Minutes Intravenous Every 6 hours 07/14/21 0924 07/14/21 1106   07/11/21 0600  ceFAZolin (ANCEF) IVPB 2g/100 mL premix  Status:  Discontinued        2 g 200 mL/hr over 30 Minutes Intravenous On call to O.R. 07/10/21 1347 07/10/21 1352   07/10/21 1445  ceFAZolin (ANCEF) IVPB 2g/100 mL premix        2 g 200 mL/hr over 30 Minutes Intravenous On call to O.R. 07/10/21 1352 07/10/21 1636   07/10/21 1300  clindamycin (CLEOCIN) IVPB 900 mg  Status:  Discontinued        900 mg 100 mL/hr over 30 Minutes Intravenous Every 8 hours 07/10/21 0018 07/13/21 1049   07/10/21 1000  vancomycin (VANCOREADY) IVPB 1250 mg/250 mL  Status:  Discontinued        1,250 mg 166.7 mL/hr over 90 Minutes Intravenous Every 12 hours 07/09/21 2121 07/12/21 1333   07/10/21 1000  piperacillin-tazobactam (ZOSYN) IVPB 3.375 g  Status:  Discontinued        3.375 g 12.5 mL/hr over 240 Minutes Intravenous Every 8 hours 07/10/21 0912 07/14/21 0924   07/09/21 2200  piperacillin-tazobactam (ZOSYN) IVPB 3.375 g  Status:  Discontinued        3.375 g 12.5 mL/hr over  240 Minutes Intravenous Every 8 hours 07/09/21 1958 07/10/21 0912   07/09/21 2000  vancomycin (VANCOREADY) IVPB 1500 mg/300 mL        1,500 mg 150 mL/hr over 120 Minutes Intravenous  Once 07/09/21 1956 07/10/21 0043   07/09/21 1845  vancomycin (VANCOCIN) IVPB 1000 mg/200 mL premix  Status:  Discontinued        1,000 mg 200 mL/hr over 60 Minutes Intravenous  Once 07/09/21 1843 07/09/21 2121        I have personally reviewed the following labs and images: CBC: Recent Labs  Lab 07/09/21 1900 07/10/21 0254 07/10/21 1559 07/11/21 0224 07/12/21 0648 07/13/21 0325 07/14/21 0323 07/15/21 0245  WBC 21.1* 15.7*  --  14.7* 14.7* 16.0* 16.8* 14.3*  NEUTROABS 18.4* 12.6*  --  9.1* 6.5 7.4  --   --   HGB 7.5* 7.2*   < > 7.4* 7.5* 6.3* 10.4* 10.7*   HCT 24.2* 24.7*   < > 23.7* 24.1* 20.4* 32.9* 33.1*  MCV 70.8* 73.5*  --  72.9* 73.7* 76.1* 79.7* 80.9  PLT 600* 543*  --  548* 541* 474* 530* 520*   < > = values in this interval not displayed.   BMP &GFR Recent Labs  Lab 07/09/21 1900 07/10/21 0254 07/10/21 1559 07/11/21 0224 07/12/21 0648 07/13/21 0325 07/14/21 0323 07/15/21 0245  NA 131* 129*   < > 134* 132* 137 141 140  K 2.4* 2.7*   < > 3.1* 3.3* 3.7 3.5 3.6  CL 100 103   < > 103 104 107 106 107  CO2 21* 20*  --  23 24 25 25 23   GLUCOSE 114* 123*   < > 153* 141* 126* 117* 114*  BUN 16 15   < > 10 8 12 15 10   CREATININE 0.64 0.63   < > 0.62 0.42* 0.56* 0.57* 0.53*  CALCIUM 8.2* 8.0*  --  7.6* 7.7* 7.9* 7.9* 8.1*  MG 1.9 1.7  --   --   --   --   --  1.8  PHOS 4.6 3.1  --   --   --   --   --  3.6   < > = values in this interval not displayed.   Estimated Creatinine Clearance: 118.2 mL/min (A) (by C-G formula based on SCr of 0.53 mg/dL (L)). Liver & Pancreas: Recent Labs  Lab 07/09/21 1900 07/10/21 0254 07/15/21 0245  AST 20   21 18   --   ALT 16   18 15   --   ALKPHOS 91   81 78  --   BILITOT 0.8   0.6 0.5  --   PROT 6.6   6.4* 6.0*  --   ALBUMIN 1.9*   2.0* 1.9* 2.2*   No results for input(s): LIPASE, AMYLASE in the last 168 hours. No results for input(s): AMMONIA in the last 168 hours. Diabetic: No results for input(s): HGBA1C in the last 72 hours. No results for input(s): GLUCAP in the last 168 hours. Cardiac Enzymes: Recent Labs  Lab 07/09/21 1900 07/15/21 0245  CKTOTAL 15* 23*   No results for input(s): PROBNP in the last 8760 hours. Coagulation Profile: No results for input(s): INR, PROTIME in the last 168 hours. Thyroid Function Tests: No results for input(s): TSH, T4TOTAL, FREET4, T3FREE, THYROIDAB in the last 72 hours. Lipid Profile: No results for input(s): CHOL, HDL, LDLCALC, TRIG, CHOLHDL, LDLDIRECT in the last 72 hours. Anemia Panel: Recent Labs    07/13/21 0325  FERRITIN  215  TIBC 343   IRON 55   Urine analysis:    Component Value Date/Time   COLORURINE YELLOW 06/13/2016 2352   APPEARANCEUR CLEAR 06/13/2016 2352   LABSPEC 1.010 06/13/2016 2352   PHURINE 5.0 06/13/2016 2352   GLUCOSEU NEGATIVE 06/13/2016 2352   HGBUR NEGATIVE 06/13/2016 2352   BILIRUBINUR NEGATIVE 06/13/2016 2352   KETONESUR NEGATIVE 06/13/2016 2352   PROTEINUR NEGATIVE 06/13/2016 2352   NITRITE NEGATIVE 06/13/2016 2352   LEUKOCYTESUR NEGATIVE 06/13/2016 2352   Sepsis Labs: Invalid input(s): PROCALCITONIN, [place]  Microbiology: Recent Results (from the past 240 hour(s))  Blood culture (routine x 2)     Status: None   Collection Time: 07/09/21  2:52 PM   Specimen: BLOOD LEFT ARM  Result Value Ref Range Status   Specimen Description BLOOD LEFT ARM  Final   Special Requests   Final    BOTTLES DRAWN AEROBIC AND ANAEROBIC Blood Culture adequate volume   Culture   Final    NO GROWTH 5 DAYS Performed at [facility], [address], [place], [place] [address]    Report Status 07/14/2021 FINAL  Final  Aerobic Culture w Gram Stain (superficial specimen)     Status: None   Collection Time: 07/09/21  6:22 PM   Specimen: Abscess  Result Value Ref Range Status   Specimen Description ABSCESS  Final   Special Requests Normal  Final   Gram Stain   Final    ABUNDANT WBC PRESENT,BOTH PMN AND MONONUCLEAR MODERATE GRAM VARIABLE ROD MODERATE GRAM POSITIVE COCCI    Culture   Final    MODERATE STREPTOCOCCUS GROUP F Beta hemolytic streptococci are predictably susceptible to penicillin and other beta lactams. Susceptibility testing not routinely performed. Performed at [facility], [place] [address] [place], [place] [address]    Report Status 07/11/2021 FINAL  Final  Resp Panel by RT-PCR (Flu A&B, Covid)     Status: None   Collection Time: 07/09/21  6:33 PM   Specimen: Nasopharyngeal(NP) swabs in vial transport medium  Result Value Ref Range Status   SARS Coronavirus 2 by RT PCR  NEGATIVE NEGATIVE Final    Comment: (NOTE) SARS-CoV-2 target nucleic acids are NOT DETECTED.  The SARS-CoV-2 RNA is generally detectable in upper respiratory specimens during the acute phase of infection. The lowest concentration of SARS-CoV-2 viral copies this assay can detect is 138 copies/mL. A negative result does not preclude SARS-Cov-2 infection and should not be used as the sole basis for treatment or other patient management decisions. A negative result may occur with  improper specimen collection/handling, submission of specimen other than nasopharyngeal swab, presence of viral mutation(s) within the areas targeted by this assay, and inadequate number of viral copies(<138 copies/mL). A negative result must be combined with clinical observations, patient history, and epidemiological information. The expected result is Negative.  Fact Sheet for Patients:  EntrepreneurPulse.com.au  Fact Sheet for Healthcare Providers:  IncredibleEmployment.be  This test is no t yet approved or cleared by the [place] FDA and  has been authorized for detection and/or diagnosis of SARS-CoV-2 by FDA under an Emergency Use Authorization (EUA). This EUA will remain  in effect (meaning this test can be used) for the duration of the COVID-19 declaration under Section 564(b)(1) of the Act, 21 U.S.C.section 360bbb-3(b)(1), unless the authorization is terminated  or revoked sooner.       Influenza A by PCR NEGATIVE NEGATIVE Final   Influenza B by PCR NEGATIVE NEGATIVE Final  Comment: (NOTE) The Xpert Xpress SARS-CoV-2/FLU/RSV plus assay is intended as an aid in the diagnosis of influenza from Nasopharyngeal swab specimens and should not be used as a sole basis for treatment. Nasal washings and aspirates are unacceptable for Xpert Xpress SARS-CoV-2/FLU/RSV testing.  Fact Sheet for Patients: EntrepreneurPulse.com.au  Fact Sheet for  Healthcare Providers: IncredibleEmployment.be  This test is not yet approved or cleared by the [place] FDA and has been authorized for detection and/or diagnosis of SARS-CoV-2 by FDA under an Emergency Use Authorization (EUA). This EUA will remain in effect (meaning this test can be used) for the duration of the COVID-19 declaration under Section 564(b)(1) of the Act, 21 U.S.C. section 360bbb-3(b)(1), unless the authorization is terminated or revoked.  Performed at [facility], [place] [address] [place], [place] [address]   Blood culture (routine x 2)     Status: None   Collection Time: 07/09/21  7:00 PM   Specimen: BLOOD  Result Value Ref Range Status   Specimen Description BLOOD LEFT ANTECUBITAL  Final   Special Requests   Final    BOTTLES DRAWN AEROBIC AND ANAEROBIC Blood Culture adequate volume   Culture   Final    NO GROWTH 5 DAYS Performed at [facility], [place] [address] [place], [place] [address]    Report Status 07/14/2021 FINAL  Final  Surgical pcr screen     Status: Abnormal   Collection Time: 07/10/21  2:09 PM   Specimen: Nasal Mucosa; Nasal Swab  Result Value Ref Range Status   MRSA, PCR NEGATIVE NEGATIVE Final   Staphylococcus aureus POSITIVE (A) NEGATIVE Final    Comment: (NOTE) The Xpert SA Assay (FDA approved for NASAL specimens in patients 52 years of age and older), is one component of a comprehensive surveillance program. It is not intended to diagnose infection nor to guide or monitor treatment. Performed at [facility], [place] [address], [place], [place] [address]   Fungus Culture With Stain     Status: None (Preliminary result)   Collection Time: 07/10/21  5:26 PM   Specimen: Arm, Right; Tissue  Result Value Ref Range Status   Fungus Stain Final report  Final    Comment: (NOTE) Performed At: [facility] [place], [place] [id number] Rush Farmer MD [id number]    Fungus  (Mycology) Culture PENDING  Incomplete   Fungal Source WOUND  Final    Comment: RIGHT ARM Performed at [facility], [place] [address], [place], [place] [address]   Aerobic/Anaerobic Culture w Gram Stain (surgical/deep wound)     Status: None   Collection Time: 07/10/21  5:26 PM   Specimen: Arm, Right; Tissue  Result Value Ref Range Status   Specimen Description WOUND RIGHT ARM  Final   Special Requests PT ON ANCEF,CLINDAMYCIN,TXA,VANCOMYCIN,ZOSYN  Final   Gram Stain   Final    MODERATE WBC PRESENT,BOTH PMN AND MONONUCLEAR ABUNDANT GRAM NEGATIVE RODS ABUNDANT GRAM POSITIVE RODS RARE GRAM POSITIVE COCCI IN PAIRS Performed at [facility], [place] [address], [place], [place] [address]    Culture   Final    MODERATE STREPTOCOCCUS GROUP F Beta hemolytic streptococci are predictably susceptible to penicillin and other beta lactams. Susceptibility testing not routinely performed. MIXED ANAEROBIC FLORA PRESENT.  CALL LAB IF FURTHER IID REQUIRED.    Report Status 07/15/2021 FINAL  Final  Fungus Culture Result     Status: None   Collection Time: 07/10/21  5:26 PM  Result Value Ref Range Status   Result 1 Comment  Final    Comment: (NOTE) KOH/Calcofluor preparation:  no fungus observed. Performed At: [facility] [place], [place] [id number] Rush Farmer MD ZD:[id number]   Aerobic/Anaerobic Culture w Gram Stain (surgical/deep wound)     Status: None   Collection Time: 07/10/21  5:29 PM   Specimen: Arm, Right; Tissue  Result Value Ref Range Status   Specimen Description TISSUE RIGHT ARM  Final   Special Requests POSTERIOR PT ON ANCEF,CLINDAMYCIN,TXA,VANC,ZOSYN  Final   Gram Stain   Final    ABUNDANT WBC PRESENT,BOTH PMN AND MONONUCLEAR ABUNDANT GRAM NEGATIVE RODS ABUNDANT GRAM POSITIVE RODS FEW GRAM POSITIVE COCCI IN PAIRS Performed at [facility], [place] [address] [place], [place] [address]    Culture   Final    ABUNDANT STREPTOCOCCUS GROUP  F Beta hemolytic streptococci are predictably susceptible to penicillin and other beta lactams. Susceptibility testing not routinely performed. RARE STAPHYLOCOCCUS EPIDERMIDIS MIXED ANAEROBIC FLORA PRESENT.  CALL LAB IF FURTHER IID REQUIRED.    Report Status 07/15/2021 FINAL  Final   Organism ID, Bacteria STAPHYLOCOCCUS EPIDERMIDIS  Final      Susceptibility   Staphylococcus epidermidis - MIC*    CIPROFLOXACIN <=0.5 SENSITIVE Sensitive     ERYTHROMYCIN >=8 RESISTANT Resistant     GENTAMICIN <=0.5 SENSITIVE Sensitive     OXACILLIN <=0.25 SENSITIVE Sensitive     TETRACYCLINE <=1 SENSITIVE Sensitive     VANCOMYCIN 1 SENSITIVE Sensitive     TRIMETH/SULFA 160 RESISTANT Resistant     CLINDAMYCIN <=0.25 SENSITIVE Sensitive     RIFAMPIN <=0.5 SENSITIVE Sensitive     Inducible Clindamycin NEGATIVE Sensitive     * RARE STAPHYLOCOCCUS EPIDERMIDIS  Fungus Culture With Stain     Status: None (Preliminary result)   Collection Time: 07/10/21  5:35 PM   Specimen: PATH Other; Wound  Result Value Ref Range Status   Fungus Stain Final report  Final    Comment: (NOTE) Performed At: [facility] [place], [place] [id number] Rush Farmer MD [id number]    Fungus (Mycology) Culture PENDING  Incomplete   Fungal Source TISSUE  Final    Comment: RIGHT ARM LATERAL Performed at [facility], [place] [address] [place], [place] [address]   Aerobic/Anaerobic Culture w Gram Stain (surgical/deep wound)     Status: None   Collection Time: 07/10/21  5:35 PM   Specimen: PATH Other; Wound  Result Value Ref Range Status   Specimen Description TISSUE RIGHT ARM  Final   Special Requests LATERAL SAMPLE B  Final   Gram Stain   Final    RARE WBC PRESENT, PREDOMINANTLY MONONUCLEAR ABUNDANT GRAM NEGATIVE RODS ABUNDANT GRAM POSITIVE RODS RARE GRAM POSITIVE COCCI IN PAIRS Performed at [facility], [place] [address], [place], [place] [address]    Culture   Final    ABUNDANT  STREPTOCOCCUS GROUP F Beta hemolytic streptococci are predictably susceptible to penicillin and other beta lactams. Susceptibility testing not routinely performed. RARE STAPHYLOCOCCUS HOMINIS MIXED ANAEROBIC FLORA PRESENT.  CALL LAB IF FURTHER IID REQUIRED.    Report Status 07/15/2021 FINAL  Final   Organism ID, Bacteria STAPHYLOCOCCUS HOMINIS  Final      Susceptibility   Staphylococcus hominis - MIC*    CIPROFLOXACIN <=0.5 SENSITIVE Sensitive     ERYTHROMYCIN >=8 RESISTANT Resistant     GENTAMICIN <=0.5 SENSITIVE Sensitive     OXACILLIN <=0.25 SENSITIVE Sensitive  TETRACYCLINE <=1 SENSITIVE Sensitive     VANCOMYCIN <=0.5 SENSITIVE Sensitive     TRIMETH/SULFA <=10 SENSITIVE Sensitive     CLINDAMYCIN <=0.25 SENSITIVE Sensitive     RIFAMPIN <=0.5 SENSITIVE Sensitive     Inducible Clindamycin NEGATIVE Sensitive     * RARE STAPHYLOCOCCUS HOMINIS  Fungus Culture Result     Status: None   Collection Time: 07/10/21  5:35 PM  Result Value Ref Range Status   Result 1 Comment  Final    Comment: (NOTE) KOH/Calcofluor preparation:  no fungus observed. Performed At: [facility] [place], [place] [id number] Rush Farmer MD [id number]     Radiology Studies: No results found.    Franchot Pollitt T. Bronson  If 7PM-7AM, please contact night-coverage [email or web address] 07/15/2021, 3:32 PM

## 2021-07-15 NOTE — Plan of Care (Signed)
°  Problem: Education: Goal: Ability to demonstrate appropriate child care will improve Outcome: Progressing Goal: Ability to verbalize an understanding of newborn treatment and procedures will improve Outcome: Progressing Goal: Ability to demonstrate an understanding of appropriate nutrition and feeding will improve Outcome: Progressing   

## 2021-07-15 NOTE — Progress Notes (Signed)
Orthopaedics Daily Progress Note   07/15/2021   9:15 AM  Justin Fisher is a 36 y.o. male 2 Days Post-Op s/p IRRIGATION AND DEBRIDEMENT UPPER EXTREMITY  Subjective Denies nausea, vomiting, or fevers. Pain appropriate and expected.  Objective Vitals:   07/15/21 0813 07/15/21 0825  BP: (!) 201/82 (!) 144/83  Pulse: 73   Resp: 20   Temp: 98.4 F (36.9 C)   SpO2: 98%     Intake/Output Summary (Last 24 hours) at 07/15/2021 0915 Last data filed at 07/15/2021 0815 Gross per 24 hour  Intake 275.6 ml  Output 4230 ml  Net -3954.4 ml    CT R forearm w/ w/o contrast 07/10/2021 did not reveal any surgically treatable abscess in forearm.  Physical Exam RUE: VAC intact holding suction Forearm remains edematous but without palpable fluctuance; interval decreased from 07/14/2021 +EDC/FDP/APB/DIO SILT M/U/R +Radial and WWP distally  Assessment 36 y.o. male s/p Procedure(s) (LRB): IRRIGATION AND DEBRIDEMENT UPPER EXTREMITY (Right)  Plan Mobility: Out of bed as tolerated Elevate right upper extremity higher than heart Suspect that R forearm and hand edema may in part be positional and dependent in the setting of massive upper arm debridement and tissue resection, as it appears to have improved with elevation  IV antibiotics per ID/primary team for RUE cellulitis and nonsurgical humerus osteomyelitis Pain control: Continue to wean/titrate to appropriate oral regimen DVT Prophylaxis: Per primary team Return to OR Wednesday 07/14/2021 for debridement and VAC exchange Plastics consulted RUE: Nonweightbearing, limited mobility due to wound vacs LUE: No restrictions RLE: No restrictions LLE: No restrictions Disposition: Pending further surgical treatment Dressing care:  Maintain wound vacs at 125 mmHg low continuous suction; elevate right upper extremity higher than heart    Georgeanna Harrison M.D. Orthopaedic Surgery [facility]

## 2021-07-16 ENCOUNTER — Inpatient Hospital Stay (HOSPITAL_COMMUNITY): Payer: BC Managed Care – PPO | Admitting: Certified Registered"

## 2021-07-16 ENCOUNTER — Encounter (HOSPITAL_COMMUNITY): Payer: Self-pay | Admitting: Internal Medicine

## 2021-07-16 ENCOUNTER — Encounter (HOSPITAL_COMMUNITY)
Admission: EM | Disposition: A | Discharge status: Home / Self Care | Payer: Self-pay | Source: Home / Self Care | Attending: Internal Medicine

## 2021-07-16 DIAGNOSIS — L02413 Cutaneous abscess of right upper limb: Secondary | ICD-10-CM | POA: Diagnosis not present

## 2021-07-16 HISTORY — PX: I & D EXTREMITY: SHX5045

## 2021-07-16 SURGERY — IRRIGATION AND DEBRIDEMENT EXTREMITY
Anesthesia: General

## 2021-07-16 MED ORDER — LIDOCAINE 2% (20 MG/ML) 5 ML SYRINGE
INTRAMUSCULAR | Status: AC
  Filled 2021-07-16: qty 5

## 2021-07-16 MED ORDER — HYDROMORPHONE HCL 1 MG/ML IJ SOLN
INTRAMUSCULAR | Status: AC
  Filled 2021-07-16: qty 1

## 2021-07-16 MED ORDER — MEPERIDINE HCL 25 MG/ML IJ SOLN: 6.2500 mg | INTRAMUSCULAR | Status: DC | PRN

## 2021-07-16 MED ORDER — HYDROMORPHONE HCL 1 MG/ML IJ SOLN
0.2500 mg | INTRAMUSCULAR | Status: DC | PRN
  Administered 2021-07-16 (×4): 0.5 mg via INTRAVENOUS

## 2021-07-16 MED ORDER — HYDROMORPHONE HCL 1 MG/ML IJ SOLN
INTRAMUSCULAR | Status: AC
  Administered 2021-07-18: 1 mg via INTRAVENOUS
  Filled 2021-07-16: qty 1

## 2021-07-16 MED ORDER — PROPOFOL 10 MG/ML IV BOLUS
INTRAVENOUS | Status: DC | PRN
  Administered 2021-07-16: 200 mg via INTRAVENOUS

## 2021-07-16 MED ORDER — LIDOCAINE 2% (20 MG/ML) 5 ML SYRINGE
INTRAMUSCULAR | Status: DC | PRN
Start: 2021-07-16 — End: 2021-07-16
  Administered 2021-07-16: 100 mg via INTRAVENOUS

## 2021-07-16 MED ORDER — LACTATED RINGERS IV SOLN: INTRAVENOUS | Status: DC

## 2021-07-16 MED ORDER — DEXAMETHASONE SODIUM PHOSPHATE 10 MG/ML IJ SOLN
INTRAMUSCULAR | Status: DC | PRN
  Administered 2021-07-16: 5 mg via INTRAVENOUS

## 2021-07-16 MED ORDER — HYDROMORPHONE HCL 1 MG/ML IJ SOLN
0.2500 mg | INTRAMUSCULAR | Status: AC | PRN
  Administered 2021-07-16 (×4): 0.5 mg via INTRAVENOUS

## 2021-07-16 MED ORDER — ONDANSETRON HCL 4 MG/2ML IJ SOLN
INTRAMUSCULAR | Status: AC
  Filled 2021-07-16: qty 2

## 2021-07-16 MED ORDER — OXYCODONE HCL 5 MG PO TABS
5.0000 mg | ORAL_TABLET | ORAL | Status: DC | PRN
  Administered 2021-07-17: 10 mg via ORAL
  Filled 2021-07-16: qty 2

## 2021-07-16 MED ORDER — OXYCODONE HCL 5 MG PO TABS
10.0000 mg | ORAL_TABLET | ORAL | Status: DC | PRN
  Administered 2021-07-16: 15 mg via ORAL
  Filled 2021-07-16: qty 3

## 2021-07-16 MED ORDER — MIDAZOLAM HCL 2 MG/2ML IJ SOLN
INTRAMUSCULAR | Status: AC
  Filled 2021-07-16: qty 2

## 2021-07-16 MED ORDER — MIDAZOLAM HCL 5 MG/5ML IJ SOLN
INTRAMUSCULAR | Status: DC | PRN
  Administered 2021-07-16: 2 mg via INTRAVENOUS

## 2021-07-16 MED ORDER — FENTANYL CITRATE (PF) 250 MCG/5ML IJ SOLN
INTRAMUSCULAR | Status: AC
  Filled 2021-07-16: qty 5

## 2021-07-16 MED ORDER — PROPOFOL 10 MG/ML IV BOLUS
INTRAVENOUS | Status: AC
  Filled 2021-07-16: qty 20

## 2021-07-16 MED ORDER — HYDROMORPHONE HCL 1 MG/ML IJ SOLN
0.5000 mg | INTRAMUSCULAR | Status: DC | PRN
  Administered 2021-07-16 – 2021-07-20 (×18): 1 mg via INTRAVENOUS
  Administered 2021-07-21: 0.5 mg via INTRAVENOUS
  Administered 2021-07-21 – 2021-07-28 (×30): 1 mg via INTRAVENOUS
  Administered 2021-07-28 – 2021-07-29 (×3): 0.5 mg via INTRAVENOUS
  Administered 2021-07-30 – 2021-08-05 (×24): 1 mg via INTRAVENOUS
  Filled 2021-07-16 (×82): qty 1

## 2021-07-16 MED ORDER — ONDANSETRON HCL 4 MG/2ML IJ SOLN
INTRAMUSCULAR | Status: DC | PRN
Start: 2021-07-16 — End: 2021-07-16
  Administered 2021-07-16: 4 mg via INTRAVENOUS

## 2021-07-16 MED ORDER — KETAMINE HCL-SODIUM CHLORIDE 100-0.9 MG/10ML-% IV SOSY
PREFILLED_SYRINGE | INTRAVENOUS | Status: DC | PRN
  Administered 2021-07-16: 20 mg via INTRAVENOUS
  Administered 2021-07-16: 10 mg via INTRAVENOUS
  Administered 2021-07-16: 20 mg via INTRAVENOUS

## 2021-07-16 MED ORDER — CHLORHEXIDINE GLUCONATE 0.12 % MT SOLN: 15.0000 mL | Freq: Once | OROMUCOSAL | Status: AC

## 2021-07-16 MED ORDER — PROMETHAZINE HCL 25 MG/ML IJ SOLN: 6.2500 mg | INTRAMUSCULAR | Status: DC | PRN

## 2021-07-16 MED ORDER — CHLORHEXIDINE GLUCONATE 0.12 % MT SOLN
OROMUCOSAL | Status: AC
  Administered 2021-07-16: 15 mL
  Filled 2021-07-16: qty 15

## 2021-07-16 MED ORDER — KETAMINE HCL 50 MG/5ML IJ SOSY
PREFILLED_SYRINGE | INTRAMUSCULAR | Status: AC
  Filled 2021-07-16: qty 5

## 2021-07-16 MED ORDER — DEXAMETHASONE SODIUM PHOSPHATE 10 MG/ML IJ SOLN
INTRAMUSCULAR | Status: AC
  Filled 2021-07-16: qty 1

## 2021-07-16 MED ORDER — ACETAMINOPHEN 500 MG PO TABS
1000.0000 mg | ORAL_TABLET | Freq: Four times a day (QID) | ORAL | Status: AC
  Administered 2021-07-16 – 2021-07-17 (×3): 1000 mg via ORAL
  Filled 2021-07-16 (×3): qty 2

## 2021-07-16 MED ORDER — FENTANYL CITRATE (PF) 100 MCG/2ML IJ SOLN
INTRAMUSCULAR | Status: DC | PRN
  Administered 2021-07-16: 50 ug via INTRAVENOUS
  Administered 2021-07-16 (×2): 100 ug via INTRAVENOUS

## 2021-07-16 MED ORDER — ORAL CARE MOUTH RINSE: 15.0000 mL | Freq: Once | OROMUCOSAL | Status: AC

## 2021-07-16 MED ORDER — SODIUM CHLORIDE 0.9 % IR SOLN
Status: DC | PRN
  Administered 2021-07-16: 3000 mL

## 2021-07-16 SURGICAL SUPPLY — 27 items
BAG COUNTER SPONGE SURGICOUNT (BAG) ×2 IMPLANT
BNDG COHESIVE 4X5 TAN ST LF (GAUZE/BANDAGES/DRESSINGS) ×1 IMPLANT
CANISTER WOUNDNEG PRESSURE 500 (CANNISTER) ×1 IMPLANT
CONNECTOR Y WND VAC (MISCELLANEOUS) IMPLANT
COVER SURGICAL LIGHT HANDLE (MISCELLANEOUS) ×2 IMPLANT
DRAPE IMP U-DRAPE 54X76 (DRAPES) ×1 IMPLANT
DRSG VAC ATS MED SENSATRAC (GAUZE/BANDAGES/DRESSINGS) ×2 IMPLANT
ELECT REM PT RETURN 9FT ADLT (ELECTROSURGICAL) ×2
ELECTRODE REM PT RTRN 9FT ADLT (ELECTROSURGICAL) IMPLANT
GLOVE SURG ENC MOIS LTX SZ8 (GLOVE) ×8 IMPLANT
GOWN STRL REUS W/ TWL LRG LVL3 (GOWN DISPOSABLE) ×3 IMPLANT
GOWN STRL REUS W/TWL 2XL LVL3 (GOWN DISPOSABLE) ×2 IMPLANT
GOWN STRL REUS W/TWL LRG LVL3 (GOWN DISPOSABLE) ×3
KIT BASIN OR (CUSTOM PROCEDURE TRAY) ×2 IMPLANT
KIT TURNOVER KIT B (KITS) ×2 IMPLANT
MANIFOLD NEPTUNE II (INSTRUMENTS) ×2 IMPLANT
NS IRRIG 1000ML POUR BTL (IV SOLUTION) ×2 IMPLANT
PACK ORTHO EXTREMITY (CUSTOM PROCEDURE TRAY) ×2 IMPLANT
PAD ARMBOARD 7.5X6 YLW CONV (MISCELLANEOUS) ×4 IMPLANT
SPONGE T-LAP 18X18 ~~LOC~~+RFID (SPONGE) ×3 IMPLANT
STOCKINETTE IMPERVIOUS LG (DRAPES) ×1 IMPLANT
TOWEL GREEN STERILE (TOWEL DISPOSABLE) ×2 IMPLANT
TOWEL GREEN STERILE FF (TOWEL DISPOSABLE) ×2 IMPLANT
TUBE CONNECTING 12X1/4 (SUCTIONS) ×3 IMPLANT
UNDERPAD 30X36 HEAVY ABSORB (UNDERPADS AND DIAPERS) ×2 IMPLANT
WND VAC CONN Y (MISCELLANEOUS) ×1
YANKAUER SUCT BULB TIP NO VENT (SUCTIONS) ×2 IMPLANT

## 2021-07-16 NOTE — Progress Notes (Signed)
Orthopaedics Daily Progress Note   07/16/2021   11:07 AM  Justin Fisher is a 36 y.o. male Day of Surgery s/p IRRIGATION AND DEBRIDEMENT UPPER EXTREMITY  Subjective Denies nausea, vomiting, or fevers. Pain appropriate and expected.  Objective Vitals:   07/16/21 0700 07/16/21 1042  BP: (!) 166/92 (!) 160/83  Pulse: 63 71  Resp: 18 18  Temp: 97.6 F (36.4 C) 97.8 F (36.6 C)  SpO2: 96% 98%    Intake/Output Summary (Last 24 hours) at 07/16/2021 1107 Last data filed at 07/16/2021 0900 Gross per 24 hour  Intake --  Output 2850 ml  Net -2850 ml    CT R forearm w/ w/o contrast 07/10/2021 did not reveal any surgically treatable abscess in forearm.  Physical Exam RUE: VAC intact holding suction Significant interval improvement in edema and erythema as well as tenderness Still no palpable fluctuance +EDC/FDP/APB/DIO SILT M/U/R +Radial and WWP distally  Assessment 36 y.o. male s/p Procedure(s) (LRB): IRRIGATION AND DEBRIDEMENT EXTREMITY AND  VAC CHANGE (N/A)  Plan Mobility: Out of bed as tolerated Elevate right upper extremity higher than heart Significant interval improvement in edema and erythema, I do suspect this was largely positional and he will continue to try to keep this elevated above his heart IV antibiotics per ID/primary team for RUE cellulitis and nonsurgical humerus osteomyelitis Pain control: Continue to wean/titrate to appropriate oral regimen DVT Prophylaxis: Per primary team Return to OR Wednesday 07/14/2021 for debridement and VAC exchange, Plastics to take over for definitive closure/coverage following final ortho debridement Plastics consulted RUE: Nonweightbearing, limited mobility due to wound vacs LUE: No restrictions RLE: No restrictions LLE: No restrictions Disposition: Pending further surgical treatment Dressing care:  Maintain wound vacs at 125 mmHg low continuous suction; elevate right upper extremity higher than heart    Ernestina Columbia  M.D. Orthopaedic Surgery [facility]

## 2021-07-16 NOTE — Progress Notes (Signed)
Mobility Specialist Progress Note   07/16/21 1800  Mobility  Activity Ambulated in hall  Level of Assistance Independent after set-up  Assistive Device None  Distance Ambulated (ft) 550 ft  Mobility Ambulated independently in hallway  Mobility Response Tolerated well  Mobility performed by Mobility specialist  $Mobility charge 1 Mobility   Received pt in bed requesting to go to the BR, Successful BM and void. Afterwards pt agreeable to mobility. Asymptomatic throughout ambulation, returned back to bed w/ call bell in reach and all needs met.  Holland Falling Mobility Specialist Phone Number [phone]

## 2021-07-16 NOTE — Plan of Care (Signed)
  Problem: Education: Goal: Ability to demonstrate appropriate child care will improve Outcome: Progressing Goal: Ability to verbalize an understanding of newborn treatment and procedures will improve Outcome: Progressing Goal: Ability to demonstrate an understanding of appropriate nutrition and feeding will improve Outcome: Progressing Goal: Individualized Educational Video(s) Outcome: Progressing   Problem: Nutritional: Goal: Nutritional status of the infant will improve as evidenced by minimal weight loss and appropriate weight gain for gestational age Outcome: Progressing Goal: Ability to maintain a balanced intake and output will improve Outcome: Progressing   Problem: Clinical Measurements: Goal: Ability to maintain clinical measurements within normal limits will improve Outcome: Progressing   Problem: Skin Integrity: Goal: Risk for impaired skin integrity will decrease Outcome: Progressing Goal: Demonstrates signs of wound healing without infection Outcome: Progressing   

## 2021-07-16 NOTE — Anesthesia Procedure Notes (Signed)
Procedure Name: LMA Insertion Date/Time: 07/16/2021 11:25 AM Performed by: Moshe Salisbury, CRNA Pre-anesthesia Checklist: Patient identified, Emergency Drugs available, Suction available and Patient being monitored Patient Re-evaluated:Patient Re-evaluated prior to induction Oxygen Delivery Method: Circle System Utilized Preoxygenation: Pre-oxygenation with 100% oxygen Induction Type: IV induction Ventilation: Mask ventilation without difficulty LMA: LMA inserted LMA Size: 5.0 Number of attempts: 1 Placement Confirmation: positive ETCO2 Tube secured with: Tape Dental Injury: Teeth and Oropharynx as per pre-operative assessment

## 2021-07-16 NOTE — Anesthesia Preprocedure Evaluation (Addendum)
Anesthesia Evaluation  Patient identified by MRN, date of birth, ID band Patient awake    Reviewed: Allergy & Precautions, H&P , NPO status , Patient's Chart, lab work & pertinent test results  Airway Mallampati: II  TM Distance: >3 FB Neck ROM: Full    Dental no notable dental hx. (+) Dental Advisory Given, Teeth Intact   Pulmonary neg pulmonary ROS, Current Smoker and Patient abstained from smoking.,    Pulmonary exam normal breath sounds clear to auscultation       Cardiovascular negative cardio ROS Normal cardiovascular exam Rhythm:Regular Rate:Normal     Neuro/Psych PSYCHIATRIC DISORDERS Anxiety Depression negative neurological ROS     GI/Hepatic negative GI ROS, (+)     substance abuse  IV drug use,   Endo/Other  negative endocrine ROS  Renal/GU negative Renal ROS     Musculoskeletal   Abdominal   Peds  Hematology  (+) Blood dyscrasia, anemia ,   Anesthesia Other Findings   Reproductive/Obstetrics                            Anesthesia Physical  Anesthesia Plan  ASA: 3  Anesthesia Plan: General   Post-op Pain Management: Toradol IV (intra-op), Tylenol PO (pre-op), Ketamine IV and Gabapentin PO (pre-op)   Induction: Intravenous  PONV Risk Score and Plan: 2 and Midazolam, Dexamethasone, Ondansetron and Treatment may vary due to age or medical condition  Airway Management Planned: Oral ETT and LMA  Additional Equipment: None  Intra-op Plan:   Post-operative Plan: Extubation in OR  Informed Consent: I have reviewed the patients History and Physical, chart, labs and discussed the procedure including the risks, benefits and alternatives for the proposed anesthesia with the patient or authorized representative who has indicated his/her understanding and acceptance.     Dental advisory given  Plan Discussed with: CRNA  Anesthesia Plan Comments:       Anesthesia Quick  Evaluation

## 2021-07-16 NOTE — Op Note (Signed)
OPERATIVE NOTE  Justin Fisher male 36 y.o. 07/16/2021  PREOPERATIVE DIAGNOSIS: Right arm wound Right arm infection Status post debridement 2 IV drug abuse/dependence  POSTOPERATIVE DIAGNOSIS: Right arm wound Right arm infection Status post debridement 2 IV drug abuse/dependence  PROCEDURE(S): Right arm wound debridement, total size 25 cm x 12 cm, total area of 300 cm, Including skin, subcutaneous tissue, muscle, fascia, and bone ([id number], [id number]) Application negative pressure wound therapy with DME to right arm wound, 300 cm total size [id number])  SURGEON: Georgeanna Harrison, M.D.  ASSISTANT(S): None  ANESTHESIA: General  FINDINGS: Preoperative Examination: Significant interval decrease in right forearm edema and erythema following elevation.  Demonstrates intact motor function and EDC, FDP index and small finger, APB, and dorsal interossei.  Sensation to light touch in median, radial, and ulnar distributions.  Normal distal pulses.  Warm and well perfused distally.  Operative Findings: Abundant granulation tissue throughout the wound without any purulence or evidence of ongoing infection.  Minimal areas of less healthy appearing skin.  IMPLANTS: * No implants in log *  INDICATIONS:  The patient is a 36 y.o. male With extensive right arm infection related to chronic IV drug use.  He has undergone 2 debridements with placement of wound VAC.  A third debridement was recommended by plastic surgery.  He understood the risks, benefits and alternatives to surgery which include but are not limited to bleeding, wound healing complications, infection, damage to surrounding structures, persistent pain, stiffness, lack of improvement, potential for subsequent arthritis or worsening of pre-existing arthritis, and need for further surgery, as well as complications related to anesthesia, cardiovascular complications, and death.  He also understood the potential for continued pain in that there  were no guarantees of acceptable outcome.  After weighing these risks the patient opted to proceed with surgery.  TECHNIQUE: Patient was identified in the preoperative holding area.  The Right arm and shoulder was marked by myself.  Consent was signed by myself and the patient.  No block was performed by anesthesia in the preoperative holding area.  Patient was taken to the operative suite and placed supine on the operative table.  Anesthesia was induced by the anesthesia team.  The patient was positioned appropriately for the procedure and all bony prominences were well padded.  A tourniquet was not used.  Patient is already on Unasyn at the recommendation of infectious disease and no additional antibiotics were given. Patient was positioned in lateral decubitus with a beanbag.  The wound VAC was removed, and The extremity was prepped and draped in the usual sterile fashion and surgical timeout was performed.  Overall the wound appeared very healthy with abundant granulation tissue and no signs of ongoing infection.  A debridement was conducted as follows: Debridement type: Excisional Debridement Side: right Body Location: Shoulder  Tools used for debridement: scalpel, scissors, curette, and rongeur Pre-debridement Wound size (cm):    Length: 25 cm        Width: A 12 cm      Depth: 1 cm  Post-debridement Wound size (cm): Length: 25 cm        Width: A 12 cm      Depth: 1 cm  Debridement depth beyond dead/damaged tissue down to healthy viable tissue: yes Tissue layer involved: skin, subcutaneous tissue, muscle / fascia, bone Nature of tissue removed: Devitalized Tissue and Non-viable tissue Irrigation volume: 3 L    Irrigation fluid type: Normal Saline Infection PRESENT AT THE TIME OF SURGERY (PATOS):  Yes  POST OPERATIVE INSTRUCTIONS: Mobility: Continue elevating right upper extremity higher than her heart as much as possible Pain control: Continue to wean/titrate to appropriate oral  regimen DVT Prophylaxis: Per primary team Further surgical plans: Per plastic surgery RUE: Nonweightbearing, limited mobility due to wound vacs, elevate higher than heart LUE: No restrictions RLE: No restrictions LLE: No restrictions Disposition: Pending plastics recommendations Dressing care:  Maintain wound VAC pending plastic surgery intervention Follow-up: Please call [facility] [phone]) to schedule follow-op appointment for 2 weeks after surgery.  TOURNIQUET TIME: N/A  BLOOD LOSS: 100 mL         DRAINS: none         SPECIMEN: none       COMPLICATIONS:  * No complications entered in OR log *         DISPOSITION: PACU - hemodynamically stable.         CONDITION: stable   Georgeanna Harrison M.D. Orthopaedic Surgery [facility]   Portions of the record have been created with voice recognition software.  Grammatical and punctuation errors, random word insertions, wrong-word or "sound-a-like" substitutions, pronoun errors (inaccuracies and/or substitutions), and/or incomplete sentences may have occurred due to the inherent limitations of voice recognition software.  Not all errors are caught or corrected.  Although every attempt is made to root out erroneous and incomplete transcription, the note may still not fully represent the intent or opinion of the author.  Read the chart carefully and recognize, using context, where errors/substitutions have occurred.  Any questions or concerns about the content of this note or information contained within the body of this dictation should be addressed directly with the author for clarification.

## 2021-07-16 NOTE — Progress Notes (Signed)
Verbal order from Dr. Waymon Amato to resume previous diet if no restrictions. REG diet ordered.

## 2021-07-16 NOTE — Progress Notes (Signed)
PROGRESS NOTE  Justin Fisher ZYY:[id number] DOB: 1985/07/25   PCP: Patient, No Pcp Per (Inactive)  Patient is from: Home  DOA: 07/09/2021 LOS: 7  Chief complaints:  Chief Complaint  Patient presents with   Arm Injury    Right arm      Brief Narrative / Interim history: 36 year old M with PMH of IVDU (heroin, fentanyl and meth), anxiety, HLD, suicidal ideation and tobacco use disorder presenting to ED on 07/09/2021 with progressive severe right upper extremity pain, swelling, oozing brown and foul-smelling liquid after injecting IV drugs using a needle with blood in it, and admitted for sepsis due to right upper arm cellulitis/neck fasciitis/abscess/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 orthopedic surgery and ID consulted.  Initially started on vancomycin, Zosyn and clindamycin.  He underwent I&D with VAC placement on 1/5 and 1/8.  Blood cultures NGTD.  Surgical culture with group F Streptococcus.  Antibiotics de-escalated to IV Zosyn.  ID, orthopedic surgery and plastic surgery following.  Hospital course complicated by acute blood loss anemia related to surgery.  He had about 2 L blood transfused from 1/8-1/9.  Subjective: Seen this morning prior to procedure along with patient's RN.  Reports ongoing pain.  Wishes to resume methadone which she was on before and was followed at [facility].  As per RN, patient was noted to be chewing on his Oxy IR earlier today and he was advised not to.  Objective: Vitals:   07/16/21 1322 07/16/21 1337 07/16/21 1400 07/16/21 1624  BP: (!) 157/89 (!) 163/96  (!) 152/80  Pulse: 63 69 67 95  Resp: 15 18 16 18   Temp:  98 F (36.7 C) 97.6 F (36.4 C) 98.6 F (37 C)  TempSrc:   Oral Oral  SpO2: 98% 99%  100%  Weight:      Height:        Examination:  General exam: Young male, moderately built and nourished lying comfortably propped up in bed without distress. Respiratory system: Clear to auscultation. Respiratory effort normal. Cardiovascular  system: S1 & S2 heard, RRR. No JVD, murmurs, rubs, gallops or clicks. No pedal edema. Gastrointestinal system: Abdomen is nondistended, soft and nontender. No organomegaly or masses felt. Normal bowel sounds heard. Central nervous system: Alert and oriented. No focal neurological deficits. Extremities: Symmetric 5 x 5 power. Skin: No rashes, lesions or ulcers.  Right upper arm, lateral aspect with large area with wound VAC.  Right upper extremity diffusely and mildly swollen but reportedly better compared to last couple of days. Psychiatry: Judgement and insight appear normal. Mood & affect appropriate.    Procedures:  1/5 and 1/8 I&D with wound VAC placement of RUE  Microbiology summarized: 1/4-COVID-19 and influenza PCR nonreactive. 1/4-superficial wound culture with moderate Streptococcus group F 1/4-blood cultures NGTD in all 4 bottles 1/5-MRSA PCR screen negative 1/5-surgical tissue culture with moderate Streptococcus group F 1/5-tissue fungal culture pending   Assessment & Plan: Right upper arm cellulitis/neck fasciitis/acute osteomyelitis and patient with history of IVDU-CRP 1.6.  ESR 20. -S/p I&D on 1/5 and 1/8 with wound VAC placement -Orthopedic surgery, infectious disease and plastic surgery following. -IV Vanco, Zosyn and clinda>> IV Zosyn>> IV Unasyn until 07/26/2021 then p.o. Augmentin until 08/23/2021 -Pain control per surgery -S/p I&D and wound VAC change on 1/11.  Polysubstance use/IVDU including heroin, cocaine, fentanyl, meth and tobacco-UDS positive for cocaine on admission.  HIV nonreactive.  Hepatitis C antibody positive. -Encourage cessation. -TOC consulted for resources -Continue nicotine patch -Hep B vaccine -Outpatient follow-up with  [facility] upon discharge. - Will review in the next day or two regarding initiating methadone  Acute blood loss anemia superimposed on chronic iron deficiency anemia-Hgb 15.5 in 2018.  It seems he has received about 2 L of blood on  1/8.  Anemia panel with some degree of iron deficiency.  Follow CBC in AM. Recent Labs    07/09/21 1900 07/10/21 0254 07/10/21 1559 07/11/21 0224 07/12/21 3154 07/13/21 0325 07/14/21 0323 07/15/21 0245  HGB 7.5* 7.2* 10.2* 7.4* 7.5* 6.3* 10.4* 10.7*  -Continue monitoring  Uncontrolled hypertension: Not on medication at home. -Discontinued IV fluid -Hydralazine as needed  Hyponatremia: Resolved  Hypokalemia: Resolved  History of anxiety/depression: Stable. -Continue home medications  Leukocytosis/bandemia/thrombocytosis: Likely reactive due to #1. -Continue monitoring  Hepatitis C antibody positive -Follow quantitative RNA  Body mass index is 25.34 kg/m. Nutrition Problem: Moderate Malnutrition Etiology: social / environmental circumstances (IV drug use) Signs/Symptoms: mild fat depletion, moderate muscle depletion Interventions: Ensure Enlive (each supplement provides 350kcal and 20 grams of protein), Juven, MVI   DVT prophylaxis:  SCDs Start: 07/13/21 1055 SCDs Start: 07/11/21 0832 SCDs Start: 07/09/21 2310  Code Status: Full code Family Communication: None at bedside. Level of care: Med-Surg.   Status is: Inpatient  Remains inpatient appropriate because: Need for IV antibiotics.  Not a candidate for home IV antibiotics.       Consultants:  Orthopedic surgeon-following Plastic surgeon-following Infectious disease-signed off   Sch Meds:  Scheduled Meds:  docusate sodium  100 mg Oral BID   feeding supplement  237 mL Oral BID BM   gabapentin  400 mg Oral TID   HYDROmorphone       HYDROmorphone       HYDROmorphone       HYDROmorphone       mirtazapine  15 mg Oral QHS   multivitamin with minerals  1 tablet Oral Daily   sertraline  200 mg Oral Daily   traZODone  100 mg Oral QHS   Continuous Infusions:  ampicillin-sulbactam (UNASYN) IV 3 g (07/16/21 1412)   PRN Meds:.acetaminophen **OR** acetaminophen, albuterol, hydrALAZINE, HYDROmorphone  (DILAUDID) injection, hydrOXYzine, metoCLOPramide **OR** metoCLOPramide (REGLAN) injection, nicotine polacrilex, ondansetron **OR** ondansetron (ZOFRAN) IV, oxyCODONE-acetaminophen  Antimicrobials: Anti-infectives (From admission, onward)    Start     Dose/Rate Route Frequency Ordered Stop   07/14/21 1400  Ampicillin-Sulbactam (UNASYN) 3 g in sodium chloride 0.9 % 100 mL IVPB        3 g 200 mL/hr over 30 Minutes Intravenous Every 6 hours 07/14/21 1106 07/26/21 2359   07/14/21 1015  Ampicillin-Sulbactam (UNASYN) 3 g in sodium chloride 0.9 % 100 mL IVPB  Status:  Discontinued        3 g 200 mL/hr over 30 Minutes Intravenous Every 6 hours 07/14/21 0924 07/14/21 1106   07/11/21 0600  ceFAZolin (ANCEF) IVPB 2g/100 mL premix  Status:  Discontinued        2 g 200 mL/hr over 30 Minutes Intravenous On call to O.R. 07/10/21 1347 07/10/21 1352   07/10/21 1445  ceFAZolin (ANCEF) IVPB 2g/100 mL premix        2 g 200 mL/hr over 30 Minutes Intravenous On call to O.R. 07/10/21 1352 07/10/21 1636   07/10/21 1300  clindamycin (CLEOCIN) IVPB 900 mg  Status:  Discontinued        900 mg 100 mL/hr over 30 Minutes Intravenous Every 8 hours 07/10/21 0018 07/13/21 1049   07/10/21 1000  vancomycin (VANCOREADY) IVPB 1250 mg/250 mL  Status:  Discontinued        1,250 mg 166.7 mL/hr over 90 Minutes Intravenous Every 12 hours 07/09/21 2121 07/12/21 1333   07/10/21 1000  piperacillin-tazobactam (ZOSYN) IVPB 3.375 g  Status:  Discontinued        3.375 g 12.5 mL/hr over 240 Minutes Intravenous Every 8 hours 07/10/21 0912 07/14/21 0924   07/09/21 2200  piperacillin-tazobactam (ZOSYN) IVPB 3.375 g  Status:  Discontinued        3.375 g 12.5 mL/hr over 240 Minutes Intravenous Every 8 hours 07/09/21 1958 07/10/21 0912   07/09/21 2000  vancomycin (VANCOREADY) IVPB 1500 mg/300 mL        1,500 mg 150 mL/hr over 120 Minutes Intravenous  Once 07/09/21 1956 07/10/21 0043   07/09/21 1845  vancomycin (VANCOCIN) IVPB 1000 mg/200  mL premix  Status:  Discontinued        1,000 mg 200 mL/hr over 60 Minutes Intravenous  Once 07/09/21 1843 07/09/21 2121        I have personally reviewed the following labs and images: CBC: Recent Labs  Lab 07/09/21 1900 07/10/21 0254 07/10/21 1559 07/11/21 0224 07/12/21 0648 07/13/21 0325 07/14/21 0323 07/15/21 0245  WBC 21.1* 15.7*  --  14.7* 14.7* 16.0* 16.8* 14.3*  NEUTROABS 18.4* 12.6*  --  9.1* 6.5 7.4  --   --   HGB 7.5* 7.2*   < > 7.4* 7.5* 6.3* 10.4* 10.7*  HCT 24.2* 24.7*   < > 23.7* 24.1* 20.4* 32.9* 33.1*  MCV 70.8* 73.5*  --  72.9* 73.7* 76.1* 79.7* 80.9  PLT 600* 543*  --  548* 541* 474* 530* 520*   < > = values in this interval not displayed.   BMP &GFR Recent Labs  Lab 07/09/21 1900 07/10/21 0254 07/10/21 1559 07/11/21 0224 07/12/21 0648 07/13/21 0325 07/14/21 0323 07/15/21 0245  NA 131* 129*   < > 134* 132* 137 141 140  K 2.4* 2.7*   < > 3.1* 3.3* 3.7 3.5 3.6  CL 100 103   < > 103 104 107 106 107  CO2 21* 20*  --  23 24 25 25 23   GLUCOSE 114* 123*   < > 153* 141* 126* 117* 114*  BUN 16 15   < > 10 8 12 15 10   CREATININE 0.64 0.63   < > 0.62 0.42* 0.56* 0.57* 0.53*  CALCIUM 8.2* 8.0*  --  7.6* 7.7* 7.9* 7.9* 8.1*  MG 1.9 1.7  --   --   --   --   --  1.8  PHOS 4.6 3.1  --   --   --   --   --  3.6   < > = values in this interval not displayed.   Estimated Creatinine Clearance: 118.2 mL/min (A) (by C-G formula based on SCr of 0.53 mg/dL (L)). Liver & Pancreas: Recent Labs  Lab 07/09/21 1900 07/10/21 0254 07/15/21 0245  AST 20   21 18   --   ALT 16   18 15   --   ALKPHOS 91   81 78  --   BILITOT 0.8   0.6 0.5  --   PROT 6.6   6.4* 6.0*  --   ALBUMIN 1.9*   2.0* 1.9* 2.2*    Cardiac Enzymes: Recent Labs  Lab 07/09/21 1900 07/15/21 0245  CKTOTAL 15* 23*     Microbiology: Recent Results (from the past 240 hour(s))  Blood culture (routine x 2)     Status: None  Collection Time: 07/09/21  2:52 PM   Specimen: BLOOD LEFT ARM  Result  Value Ref Range Status   Specimen Description BLOOD LEFT ARM  Final   Special Requests   Final    BOTTLES DRAWN AEROBIC AND ANAEROBIC Blood Culture adequate volume   Culture   Final    NO GROWTH 5 DAYS Performed at [facility], [address], [place], [place] [address]    Report Status 07/14/2021 FINAL  Final  Aerobic Culture w Gram Stain (superficial specimen)     Status: None   Collection Time: 07/09/21  6:22 PM   Specimen: Abscess  Result Value Ref Range Status   Specimen Description ABSCESS  Final   Special Requests Normal  Final   Gram Stain   Final    ABUNDANT WBC PRESENT,BOTH PMN AND MONONUCLEAR MODERATE GRAM VARIABLE ROD MODERATE GRAM POSITIVE COCCI    Culture   Final    MODERATE STREPTOCOCCUS GROUP F Beta hemolytic streptococci are predictably susceptible to penicillin and other beta lactams. Susceptibility testing not routinely performed. Performed at [facility], [place] [address] [place], [place] [address]    Report Status 07/11/2021 FINAL  Final  Resp Panel by RT-PCR (Flu A&B, Covid)     Status: None   Collection Time: 07/09/21  6:33 PM   Specimen: Nasopharyngeal(NP) swabs in vial transport medium  Result Value Ref Range Status   SARS Coronavirus 2 by RT PCR NEGATIVE NEGATIVE Final    Comment: (NOTE) SARS-CoV-2 target nucleic acids are NOT DETECTED.  The SARS-CoV-2 RNA is generally detectable in upper respiratory specimens during the acute phase of infection. The lowest concentration of SARS-CoV-2 viral copies this assay can detect is 138 copies/mL. A negative result does not preclude SARS-Cov-2 infection and should not be used as the sole basis for treatment or other patient management decisions. A negative result may occur with  improper specimen collection/handling, submission of specimen other than nasopharyngeal swab, presence of viral mutation(s) within the areas targeted by this assay, and inadequate number of viral copies(<138 copies/mL). A  negative result must be combined with clinical observations, patient history, and epidemiological information. The expected result is Negative.  Fact Sheet for Patients:  EntrepreneurPulse.com.au  Fact Sheet for Healthcare Providers:  IncredibleEmployment.be  This test is no t yet approved or cleared by the [place] FDA and  has been authorized for detection and/or diagnosis of SARS-CoV-2 by FDA under an Emergency Use Authorization (EUA). This EUA will remain  in effect (meaning this test can be used) for the duration of the COVID-19 declaration under Section 564(b)(1) of the Act, 21 U.S.C.section 360bbb-3(b)(1), unless the authorization is terminated  or revoked sooner.       Influenza A by PCR NEGATIVE NEGATIVE Final   Influenza B by PCR NEGATIVE NEGATIVE Final    Comment: (NOTE) The Xpert Xpress SARS-CoV-2/FLU/RSV plus assay is intended as an aid in the diagnosis of influenza from Nasopharyngeal swab specimens and should not be used as a sole basis for treatment. Nasal washings and aspirates are unacceptable for Xpert Xpress SARS-CoV-2/FLU/RSV testing.  Fact Sheet for Patients: EntrepreneurPulse.com.au  Fact Sheet for Healthcare Providers: IncredibleEmployment.be  This test is not yet approved or cleared by the [place] FDA and has been authorized for detection and/or diagnosis of SARS-CoV-2 by FDA under an Emergency Use Authorization (EUA). This EUA will remain in effect (meaning this test can be used) for the duration of the COVID-19 declaration under Section 564(b)(1) of the  Act, 21 U.S.C. section 360bbb-3(b)(1), unless the authorization is terminated or revoked.  Performed at [facility], [place] [address], [place], [place] [address]   Blood culture (routine x 2)     Status: None   Collection Time: 07/09/21  7:00 PM   Specimen: BLOOD  Result Value Ref Range Status   Specimen  Description BLOOD LEFT ANTECUBITAL  Final   Special Requests   Final    BOTTLES DRAWN AEROBIC AND ANAEROBIC Blood Culture adequate volume   Culture   Final    NO GROWTH 5 DAYS Performed at [facility], [place] [address] [place], [place] [address]    Report Status 07/14/2021 FINAL  Final  Surgical pcr screen     Status: Abnormal   Collection Time: 07/10/21  2:09 PM   Specimen: Nasal Mucosa; Nasal Swab  Result Value Ref Range Status   MRSA, PCR NEGATIVE NEGATIVE Final   Staphylococcus aureus POSITIVE (A) NEGATIVE Final    Comment: (NOTE) The Xpert SA Assay (FDA approved for NASAL specimens in patients 5 years of age and older), is one component of a comprehensive surveillance program. It is not intended to diagnose infection nor to guide or monitor treatment. Performed at [facility], [place] [address], [place], [place] [address]   Fungus Culture With Stain     Status: None (Preliminary result)   Collection Time: 07/10/21  5:26 PM   Specimen: Arm, Right; Tissue  Result Value Ref Range Status   Fungus Stain Final report  Final    Comment: (NOTE) Performed At: [facility] [place], [place] [id number] Rush Farmer MD [id number]    Fungus (Mycology) Culture PENDING  Incomplete   Fungal Source WOUND  Final    Comment: RIGHT ARM Performed at [facility], [place] [address], [place], [place] [address]   Aerobic/Anaerobic Culture w Gram Stain (surgical/deep wound)     Status: None   Collection Time: 07/10/21  5:26 PM   Specimen: Arm, Right; Tissue  Result Value Ref Range Status   Specimen Description WOUND RIGHT ARM  Final   Special Requests PT ON ANCEF,CLINDAMYCIN,TXA,VANCOMYCIN,ZOSYN  Final   Gram Stain   Final    MODERATE WBC PRESENT,BOTH PMN AND MONONUCLEAR ABUNDANT GRAM NEGATIVE RODS ABUNDANT GRAM POSITIVE RODS RARE GRAM POSITIVE COCCI IN PAIRS Performed at [facility], [place] [address] [place], [place] [address]    Culture    Final    MODERATE STREPTOCOCCUS GROUP F Beta hemolytic streptococci are predictably susceptible to penicillin and other beta lactams. Susceptibility testing not routinely performed. MIXED ANAEROBIC FLORA PRESENT.  CALL LAB IF FURTHER IID REQUIRED.    Report Status 07/15/2021 FINAL  Final  Fungus Culture Result     Status: None   Collection Time: 07/10/21  5:26 PM  Result Value Ref Range Status   Result 1 Comment  Final    Comment: (NOTE) KOH/Calcofluor preparation:  no fungus observed. Performed At: [facility] [place], [place] [id number] Rush Farmer MD VO:[id number]   Aerobic/Anaerobic Culture w Gram Stain (surgical/deep wound)     Status: None   Collection Time: 07/10/21  5:29 PM   Specimen: Arm, Right; Tissue  Result Value Ref Range Status   Specimen Description TISSUE RIGHT ARM  Final   Special Requests POSTERIOR PT ON ANCEF,CLINDAMYCIN,TXA,VANC,ZOSYN  Final   Gram Stain   Final    ABUNDANT WBC PRESENT,BOTH PMN AND MONONUCLEAR ABUNDANT GRAM NEGATIVE RODS ABUNDANT  GRAM POSITIVE RODS FEW GRAM POSITIVE COCCI IN PAIRS Performed at [facility], [place] [address] [place], [place] [address]    Culture   Final    ABUNDANT STREPTOCOCCUS GROUP F Beta hemolytic streptococci are predictably susceptible to penicillin and other beta lactams. Susceptibility testing not routinely performed. RARE STAPHYLOCOCCUS EPIDERMIDIS MIXED ANAEROBIC FLORA PRESENT.  CALL LAB IF FURTHER IID REQUIRED.    Report Status 07/15/2021 FINAL  Final   Organism ID, Bacteria STAPHYLOCOCCUS EPIDERMIDIS  Final      Susceptibility   Staphylococcus epidermidis - MIC*    CIPROFLOXACIN <=0.5 SENSITIVE Sensitive     ERYTHROMYCIN >=8 RESISTANT Resistant     GENTAMICIN <=0.5 SENSITIVE Sensitive     OXACILLIN <=0.25 SENSITIVE Sensitive     TETRACYCLINE <=1 SENSITIVE Sensitive     VANCOMYCIN 1 SENSITIVE Sensitive     TRIMETH/SULFA 160 RESISTANT Resistant     CLINDAMYCIN <=0.25  SENSITIVE Sensitive     RIFAMPIN <=0.5 SENSITIVE Sensitive     Inducible Clindamycin NEGATIVE Sensitive     * RARE STAPHYLOCOCCUS EPIDERMIDIS  Fungus Culture With Stain     Status: None (Preliminary result)   Collection Time: 07/10/21  5:35 PM   Specimen: PATH Other; Wound  Result Value Ref Range Status   Fungus Stain Final report  Final    Comment: (NOTE) Performed At: [facility] [place], [place] [id number] Rush Farmer MD UV:[id number]    Fungus (Mycology) Culture PENDING  Incomplete   Fungal Source TISSUE  Final    Comment: RIGHT ARM LATERAL Performed at [facility], [place] [address] [place], [place] [address]   Aerobic/Anaerobic Culture w Gram Stain (surgical/deep wound)     Status: None   Collection Time: 07/10/21  5:35 PM   Specimen: PATH Other; Wound  Result Value Ref Range Status   Specimen Description TISSUE RIGHT ARM  Final   Special Requests LATERAL SAMPLE B  Final   Gram Stain   Final    RARE WBC PRESENT, PREDOMINANTLY MONONUCLEAR ABUNDANT GRAM NEGATIVE RODS ABUNDANT GRAM POSITIVE RODS RARE GRAM POSITIVE COCCI IN PAIRS Performed at [facility], [place] [address] [place],  [address]    Culture   Final    ABUNDANT STREPTOCOCCUS GROUP F Beta hemolytic streptococci are predictably susceptible to penicillin and other beta lactams. Susceptibility testing not routinely performed. RARE STAPHYLOCOCCUS HOMINIS MIXED ANAEROBIC FLORA PRESENT.  CALL LAB IF FURTHER IID REQUIRED.    Report Status 07/15/2021 FINAL  Final   Organism ID, Bacteria STAPHYLOCOCCUS HOMINIS  Final      Susceptibility   Staphylococcus hominis - MIC*    CIPROFLOXACIN <=0.5 SENSITIVE Sensitive     ERYTHROMYCIN >=8 RESISTANT Resistant     GENTAMICIN <=0.5 SENSITIVE Sensitive     OXACILLIN <=0.25 SENSITIVE Sensitive     TETRACYCLINE <=1 SENSITIVE Sensitive     VANCOMYCIN <=0.5 SENSITIVE Sensitive     TRIMETH/SULFA <=10 SENSITIVE Sensitive     CLINDAMYCIN  <=0.25 SENSITIVE Sensitive     RIFAMPIN <=0.5 SENSITIVE Sensitive     Inducible Clindamycin NEGATIVE Sensitive     * RARE STAPHYLOCOCCUS HOMINIS  Fungus Culture Result     Status: None   Collection Time: 07/10/21  5:35 PM  Result Value Ref Range Status   Result 1 Comment  Final    Comment: (NOTE) KOH/Calcofluor preparation:  no fungus observed. Performed At: [facility] [place], [place] [id number] Rush Farmer MD OA:[id number]  Radiology Studies: No results found.   Vernell Leep, MD,  FACP, [facility], [facility], [facility] (Care Management Physician Certified) [facility] [place]  To contact the attending provider between 7A-7P or the covering provider during after hours 7P-7A, please log into the web site [email or web address] and access using universal [place] password for that web site. If you do not have the password, please call the hospital operator.

## 2021-07-16 NOTE — Progress Notes (Addendum)
CSW LM with Les at [facility].  CSW informed that [facility] is doing methadone services. Lurline Idol, MSW, LCSW 1/11/202311:01 AM   1350: CSW called and left a second message with Les at [facility]. Lurline Idol, MSW, LCSW 1/11/20231:51 PM    1500:  CSW received call from Le [place] at [facility]. They can manage pt methadone, have worked with him before and can get reinvolved at DC.  Pt will need to come in for intake on a Monday or Tuesday and then would be able to see the MD on Wed and get his first dose.  There is also a way for him to get a "guest dose" if needed prior to him seeing the MD, he will fax over a form that would need to be signed for this.  Need to call him back to schedule the intake appt when we know when DC will be.  Lurline Idol, MSW, LCSW 1/11/20233:16 PM

## 2021-07-16 NOTE — H&P (Signed)
PREOPERATIVE H&P  HPI: Justin Fisher is a 36 y.o. male who has presented today for surgery, with the diagnosis of extensive right arm infection with abscesses and some components of necrotizing fasciitis.  Is undergone multiple debridements with placement of wound VAC.  Plastic surgery is consulted.  Plastic surgery recommended 1 additional debridement before taking over..  The various methods of treatment have been discussed with the patient and family.  After consideration of risks, benefits, and other options for treatment, the patient has consented to [facility] as a surgical intervention.  The patient's history has been reviewed, patient examined, no change in status, stable for surgery.  I have reviewed the patient's chart and labs.  Questions were answered to the patient's satisfaction.    PMH: Past Medical History:  Diagnosis Date   Anxiety    Suicidal thoughts     Home Medications Allergies  No current facility-administered medications on file prior to encounter.   Current Outpatient Medications on File Prior to Encounter  Medication Sig Dispense Refill   acetaminophen (TYLENOL) 500 MG tablet Take 1,000 mg by mouth every 6 (six) hours as needed for moderate pain or headache.     gabapentin (NEURONTIN) 300 MG capsule Take 300 mg by mouth 3 (three) times daily.     hydrOXYzine (VISTARIL) 50 MG capsule Take 50 mg by mouth 3 (three) times daily as needed for itching.     ibuprofen (ADVIL) 200 MG tablet Take 600 mg by mouth every 6 (six) hours as needed for mild pain or headache.     methocarbamol (ROBAXIN) 750 MG tablet Take 750 mg by mouth 2 (two) times daily as needed for muscle spasms.     mirtazapine (REMERON) 15 MG tablet Take 15 mg by mouth at bedtime.     sertraline (ZOLOFT) 100 MG tablet Take 2 tablets (200 mg total) by mouth daily. For depression 60 tablet 0   traZODone (DESYREL) 100 MG tablet Take 1 tablet (100 mg total) by mouth at  bedtime as needed for sleep (may repeat x 1). For sleep (Patient taking differently: Take 100 mg by mouth at bedtime. For sleep) 14 tablet 0   gabapentin (NEURONTIN) 400 MG capsule Take 1 capsule (400 mg total) by mouth 3 (three) times daily. For agitation (Patient not taking: Reported on 07/09/2021) 90 capsule 0   hydrOXYzine (ATARAX/VISTARIL) 50 MG tablet Take 1 tablet (50 mg total) by mouth every 6 (six) hours as needed for anxiety. (Patient not taking: Reported on 07/09/2021) 30 tablet 0   lamoTRIgine (LAMICTAL) 25 MG tablet Take 1 tablet (25 mg total) by mouth daily. For mood stabilization (Patient not taking: Reported on 07/09/2021) 30 tablet 0   meloxicam (MOBIC) 7.5 MG tablet Take 1 tablet (7.5 mg total) by mouth daily. (Patient not taking: Reported on 07/09/2021) 30 tablet 0   nicotine polacrilex (NICORETTE) 2 MG gum Take 1 each (2 mg total) by mouth as needed for smoking cessation. (Patient not taking: Reported on 07/09/2021) 100 tablet 0   No Known Allergies   PSH: Past Surgical History:  Procedure Laterality Date   APPLICATION OF WOUND VAC Right 07/10/2021   Procedure: APPLICATION OF WOUND VAC;  Surgeon: Georgeanna Harrison, MD;  Location: [place];  Service: Orthopedics;  Laterality: Right;   I & D EXTREMITY Right 07/10/2021   Procedure: IRRIGATION AND DEBRIDEMENT RIGHT UPPER ARM;  Surgeon: Georgeanna Harrison, MD;  Location: [place];  Service: Orthopedics;  Laterality: Right;  I & D EXTREMITY Right 07/13/2021   Procedure: IRRIGATION AND DEBRIDEMENT UPPER EXTREMITY;  Surgeon: Melrose Nakayama, MD;  Location: [place];  Service: Orthopedics;  Laterality: Right;   SHOULDER SURGERY Left      Family History Social History  History reviewed. No pertinent family history.  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, last use 2-3 mths   Sexual activity: Yes    Birth control/protection: None  Other Topics Concern   Not on file  Social History Narrative   Not on file   Social Determinants of Health   Financial Resource Strain: Not on file  Food Insecurity: Not on file  Transportation Needs: Not on file  Physical Activity: Not on file  Stress: Not on file  Social Connections: Not on file     Review of Systems: MSK: As noted per HPI above GI: No current Nausea/vomiting ENT: Denies sore throat, epistaxis CV: Denies chest pain Resp: No current shortness of breath  Other than mentioned above, there are no Constitutional, Neurological, Psychiatric, ENT, Ophthalmological, Cardiovascular, Respiratory, GI, GU, Musculoskeletal, Integumentary, Lymphatic, Endocrine or Allergic issues.   Physical Examination: CV: Normal distal pulses Lungs: Unlabored respirations RUE: Significant interval improvement in forearm and hand edema and erythema.  Intact motor function in the EDC, FDP index and small finger, APB, dorsal interossei.  Sensation tact light touch median, radial, ulnar distributions.  Normal distal pulses.  Warm and well-perfused.  Assessment/Plan: IRRIGATION AND DEBRIDEMENT EXTREMITY AND  VAC CHANGE    Georgeanna Harrison M.D. Orthopaedic Surgery [facility]  Review of this patient's medications prescribed by other providers does not in any way constitute an endorsement by this clinician of their use, indications, dosage, route, efficacy, interactions, or other clinical parameters.  Portions of the record have been created with voice recognition software.  Grammatical and punctuation errors, random word insertions, wrong-word or  "sound-a-like" substitutions, pronoun errors (inaccuracies and/or substitutions), and/or incomplete sentences may have occurred due to the inherent limitations of voice recognition software.  Not all errors are caught or corrected.  Although every attempt is made to root out erroneous and incomplete transcription, the note may still not fully represent the intent or opinion of the author.  Read the chart carefully and recognize, using context, where errors/substitutions have occurred.  Any questions or concerns about the content of this note or information contained within the body of this dictation should be addressed directly with the author for clarification.

## 2021-07-16 NOTE — Transfer of Care (Signed)
Immediate Anesthesia Transfer of Care Note  Patient: Justin Fisher  Procedure(s) Performed: IRRIGATION AND DEBRIDEMENT EXTREMITY AND  VAC CHANGE  Patient Location: PACU  Anesthesia Type:General  Level of Consciousness: drowsy and patient cooperative  Airway & Oxygen Therapy: Patient Spontanous Breathing and Patient connected to nasal cannula oxygen  Post-op Assessment: Report given to RN, Post -op Vital signs reviewed and stable and Patient moving all extremities  Post vital signs: Reviewed and stable  Last Vitals:  Vitals Value Taken Time  BP 163/93 07/16/21 1222  Temp    Pulse 76 07/16/21 1224  Resp 13 07/16/21 1224  SpO2 100 % 07/16/21 1224  Vitals shown include unvalidated device data.  Last Pain:  Vitals:   07/16/21 1042  TempSrc: Oral  PainSc:       Patients Stated Pain Goal: 2 (07/16/21 0851)  Complications: No notable events documented.

## 2021-07-16 NOTE — Progress Notes (Signed)
Pt. Is chewing his Oxycodone, this nurse told pt. He is not to chew his meds.

## 2021-07-16 NOTE — Progress Notes (Signed)
Dr. Algis Liming notified of pt continuing to chew oxy when given.

## 2021-07-17 ENCOUNTER — Encounter (HOSPITAL_COMMUNITY): Payer: Self-pay | Admitting: Orthopedic Surgery

## 2021-07-17 DIAGNOSIS — L02413 Cutaneous abscess of right upper limb: Secondary | ICD-10-CM | POA: Diagnosis not present

## 2021-07-17 DIAGNOSIS — E440 Moderate protein-calorie malnutrition: Secondary | ICD-10-CM | POA: Insufficient documentation

## 2021-07-17 DIAGNOSIS — Z720 Tobacco use: Secondary | ICD-10-CM | POA: Diagnosis not present

## 2021-07-17 DIAGNOSIS — F1120 Opioid dependence, uncomplicated: Secondary | ICD-10-CM | POA: Diagnosis not present

## 2021-07-17 LAB — HCV RNA QUANT RFLX ULTRA OR GENOTYP
HCV RNA Qnt(log copy/mL): 6.973 log10 IU/mL
HepC Qn: 9400000 IU/mL

## 2021-07-17 LAB — HEPATITIS C GENOTYPE: Hepatitis C Genotype: 3

## 2021-07-17 MED ORDER — METHADONE HCL 10 MG PO TABS
30.0000 mg | ORAL_TABLET | Freq: Every day | ORAL | Status: DC
  Administered 2021-07-17 – 2021-07-20 (×4): 30 mg via ORAL
  Filled 2021-07-17 (×4): qty 3

## 2021-07-17 MED ORDER — MIRTAZAPINE 15 MG PO TABS
30.0000 mg | ORAL_TABLET | Freq: Every day | ORAL | Status: DC
  Administered 2021-07-17 – 2021-08-04 (×19): 30 mg via ORAL
  Filled 2021-07-17 (×19): qty 2

## 2021-07-17 MED ORDER — OXYCODONE HCL 5 MG PO TABS
10.0000 mg | ORAL_TABLET | ORAL | Status: DC | PRN
  Administered 2021-07-19 – 2021-08-04 (×49): 10 mg via ORAL
  Filled 2021-07-17 (×52): qty 2

## 2021-07-17 MED ORDER — LOPERAMIDE HCL 2 MG PO CAPS
2.0000 mg | ORAL_CAPSULE | Freq: Three times a day (TID) | ORAL | Status: DC | PRN
  Administered 2021-07-17: 2 mg via ORAL
  Filled 2021-07-17: qty 1

## 2021-07-17 MED ORDER — HYDROXYZINE HCL 25 MG PO TABS
50.0000 mg | ORAL_TABLET | ORAL | Status: DC | PRN
  Administered 2021-07-17 – 2021-08-03 (×36): 50 mg via ORAL
  Filled 2021-07-17 (×37): qty 2

## 2021-07-17 NOTE — Progress Notes (Signed)
[facility] PROGRESS NOTE  Justin Fisher TKW:[id number] DOB: [birth year] DOA: 07/09/2021 PCP: Patient, No Pcp Per (Inactive)  Status: Remains inpatient appropriate because:  Unsafe discharge plan noting requires IV antibiotics for current medical condition history of IVD use not a candidate for home IV therapy; also requiring frequent reevaluation by surgical team regarding wound on upper right arm that is also requiring wound VAC therapy  Barriers to discharge: Social:Known history of active IV drug abuse prior to admission  Clinical: Continues to require IV antibiotics and regular wound therapy for wound upper extremity.  May require additional surgical procedures including plastic surgery  Level of care:  Med-Surg   Code Status: Full Family Communication: Patient only DVT prophylaxis: SCDs COVID vaccination status: Unknown    HPI: 36 year old M with PMH of IVDU (heroin, fentanyl and meth), anxiety, HLD, suicidal ideation and tobacco use disorder presenting to ED on 07/09/2021 with progressive severe right upper extremity pain, swelling, oozing brown and foul-smelling liquid after injecting IV drugs using a needle with blood in it, and admitted for sepsis due to right upper arm cellulitis/neck fasciitis/abscess/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 orthopedic surgery and ID consulted.  Initially started on vancomycin, Zosyn and clindamycin.  He underwent I&D with VAC placement on 1/5 and 1/8.  Blood cultures NGTD.  Surgical culture with group F Streptococcus.  Antibiotics de-escalated to IV Zosyn.  ID, orthopedic surgery and plastic surgery following.   Hospital course complicated by acute blood loss anemia related to surgery.  He had about 2 L blood transfused from 1/8-1/9.  Subjective: Patient primarily reporting increased levels of anxiety and preadmission Vistaril not working as effectively as prior to admission.  Also requesting to be resumed on methadone.  States has  made contact with [facility] at [facility] to review expected process to become enrolled again through their methadone drug treatment program.  Objective: Vitals:   07/17/21 0000 07/17/21 0730  BP: 131/75 (!) 159/81  Pulse: 80 74  Resp: 20 17  Temp: 97.9 F (36.6 C) (!) 97.4 F (36.3 C)  SpO2: 95% 99%    Intake/Output Summary (Last 24 hours) at 07/17/2021 0817 Last data filed at 07/17/2021 0115 Gross per 24 hour  Intake 600 ml  Output 1100 ml  Net -500 ml   Filed Weights   07/10/21 2308  Weight: 73.4 kg    Exam:  Constitutional: NAD, calm but does report anxiety, comfortable Respiratory: clear to auscultation bilaterally, no wheezing, no crackles. Normal respiratory effort. No accessory muscle use. RA Cardiovascular: Regular rate and rhythm, no murmurs / rubs / gallops. No lower extremity edema. 2+ pedal pulses.  Does have edema right upper extremity in relation to recent infection and surgical procedures. Abdomen: no tenderness, no masses palpated. Bowel sounds positive. LBM 1/12 Skin: no rashes, lesions, ulcers. No induration.  Does have wound VAC over upper right arm over surgical wound Neurologic: CN 2-12 grossly intact. Sensation intact, Strength 5/5 x all 4 extremities.  Psychiatric: Normal judgment and insight. Alert and oriented x 3.  Anxious mood.    Assessment/Plan: Acute problems: Right upper arm cellulitis/neck fasciitis/acute osteomyelitis and patient with history of IVDU -Superficial wound culture positive for moderate strep group F with intraoperative surgical culture from 1/5 with moderate strep group F; blood cultures negative -CRP 1.6.  ESR 20. -S/p I&D on 1/5 and 1/8 with wound VAC placement -Orthopedic surgery, infectious disease and plastic surgery following. -IV Vanco, Zosyn and clinda>> IV Zosyn>> IV Unasyn until 07/26/2021 then p.o. Augmentin until  08/23/2021 -Since methadone is being initiated I have adjusted pain medications as follows: We will continue IV  Dilaudid for severe breakthrough pain.  Have discontinued higher dose of Oxy IR that was added for severe pain and replace this with a 10 mg dose every 4 hours for moderate breakthrough pain -S/p I&D and wound VAC change on 1/11.   Polysubstance use/IVDU including heroin, cocaine, fentanyl, meth and tobacco -UDS positive for cocaine on admission.  HIV nonreactive.  Hepatitis C antibody positive. -Encourage cessation. -Continue nicotine patch -Hep B vaccine -Outpatient follow-up with [facility] upon discharge. -1/12 initiated methadone 30 mg daily based on recommendations from [facility] (Spoke w/ Jerral Ralph) and [facility] pharmacy   Acute blood loss anemia superimposed on chronic iron deficiency anemia -Hgb 15.5 in 2018.   -Patient was given 1 unit of PRBCs on 1/5 and a total of 4 units of PRBCs on 1/8 -Current hemoglobin 10.7   Uncontrolled hypertension:  Not on medication at home. -Continue hydralazine as needed   Hyponatremia:  Resolved   Hypokalemia:  Resolved   History of anxiety/depression:  Stable. -Primarily on Vistaril at home with current dosage ineffective therefore will increase frequency to every 6 hours as needed -Discussed with [facility] and per my thoughts also stated would not utilize benzodiazepines but felt that resumption of methadone would be quite helpful in improving patient's level of anxiety   Leukocytosis/bandemia/thrombocytosis:  Likely reactive due to #1. -Continue monitoring   Hepatitis C antibody positive -Follow quantitative RNA      Data Reviewed: Basic Metabolic Panel: Recent Labs  Lab 07/11/21 0224 07/12/21 0648 07/13/21 0325 07/14/21 0323 07/15/21 0245  NA 134* 132* 137 141 140  K 3.1* 3.3* 3.7 3.5 3.6  CL 103 104 107 106 107  CO2 _0 GLUCOSE 153* 141* 126* 117* 114*  BUN _1 CREATININE 0.62 0.42* 0.56* 0.57* 0.53*  CALCIUM 7.6* 7.7* 7.9* 7.9* 8.1*  MG  --   --   --   --  1.8  PHOS  --   --   --   --  3.6    Liver Function Tests: Recent Labs  Lab 07/15/21 0245  ALBUMIN 2.2*    CBC: Recent Labs  Lab 07/11/21 0224 07/12/21 0648 07/13/21 0325 07/14/21 0323 07/15/21 0245  WBC 14.7* 14.7* 16.0* 16.8* 14.3*  NEUTROABS 9.1* 6.5 7.4  --   --   HGB 7.4* 7.5* 6.3* 10.4* 10.7*  HCT 23.7* 24.1* 20.4* 32.9* 33.1*  MCV 72.9* 73.7* 76.1* 79.7* 80.9  PLT 548* 541* 474* 530* 520*   Cardiac Enzymes: Recent Labs  Lab 07/15/21 0245  CKTOTAL 23*    Recent Results (from the past 240 hour(s))  Blood culture (routine x 2)     Status: None   Collection Time: 07/09/21  2:52 PM   Specimen: BLOOD LEFT ARM  Result Value Ref Range Status   Specimen Description BLOOD LEFT ARM  Final   Special Requests   Final    BOTTLES DRAWN AEROBIC AND ANAEROBIC Blood Culture adequate volume   Culture   Final    NO GROWTH 5 DAYS Performed at [facility], [address] [place], [place] [address]    Report Status 07/14/2021 FINAL  Final  Aerobic Culture w Gram Stain (superficial specimen)     Status: None   Collection Time: 07/09/21  6:22 PM   Specimen: Abscess  Result Value Ref Range Status   Specimen  Description ABSCESS  Final  ° Special Requests Normal  Final  ° Gram Stain   Final  °  ABUNDANT WBC PRESENT,BOTH PMN AND MONONUCLEAR °MODERATE GRAM VARIABLE ROD °MODERATE GRAM POSITIVE COCCI °  ° Culture   Final  °  MODERATE STREPTOCOCCUS GROUP F °Beta hemolytic streptococci are predictably susceptible to penicillin and other beta lactams. Susceptibility testing not routinely performed. °Performed at [facility], [address] [place], [place] [address] °  ° Report Status 07/11/2021 FINAL  Final  °Resp Panel by RT-PCR (Flu A&B, Covid)     Status: None  ° Collection Time: 07/09/21  6:33 PM  ° Specimen: Nasopharyngeal(NP) swabs in vial transport medium  °Result Value Ref Range Status  ° SARS Coronavirus 2 by RT PCR NEGATIVE NEGATIVE Final  °  Comment: (NOTE) °SARS-CoV-2 target nucleic acids are NOT  DETECTED. ° °The SARS-CoV-2 RNA is generally detectable in upper respiratory °specimens during the acute phase of infection. The lowest °concentration of SARS-CoV-2 viral copies this assay can detect is °138 copies/mL. A negative result does not preclude SARS-Cov-2 °infection and should not be used as the sole basis for treatment or °other patient management decisions. A negative result may occur with  °improper specimen collection/handling, submission of specimen other °than nasopharyngeal swab, presence of viral mutation(s) within the °areas targeted by this assay, and inadequate number of viral °copies(<138 copies/mL). A negative result must be combined with °clinical observations, patient history, and epidemiological °information. The expected result is Negative. ° °Fact Sheet for Patients:  °https://www.fda.gov/media/152166/download ° °Fact Sheet for Healthcare Providers:  °https://www.fda.gov/media/152162/download ° °This test is no t yet approved or cleared by the [place] FDA and  °has been authorized for detection and/or diagnosis of SARS-CoV-2 by °FDA under an Emergency Use Authorization (EUA). This EUA will remain  °in effect (meaning this test can be used) for the duration of the °COVID-19 declaration under Section 564(b)(1) of the Act, 21 °U.S.C.section 360bbb-3(b)(1), unless the authorization is terminated  °or revoked sooner.  ° ° °  ° Influenza A by PCR NEGATIVE NEGATIVE Final  ° Influenza B by PCR NEGATIVE NEGATIVE Final  °  Comment: (NOTE) °The Xpert Xpress SARS-CoV-2/FLU/RSV plus assay is intended as an aid °in the diagnosis of influenza from Nasopharyngeal swab specimens and °should not be used as a sole basis for treatment. Nasal washings and °aspirates are unacceptable for Xpert Xpress SARS-CoV-2/FLU/RSV °testing. ° °Fact Sheet for Patients: °https://www.fda.gov/media/152166/download ° °Fact Sheet for Healthcare Providers: °https://www.fda.gov/media/152162/download ° °This test is not yet  approved or cleared by the [place] FDA and °has been authorized for detection and/or diagnosis of SARS-CoV-2 by °FDA under an Emergency Use Authorization (EUA). This EUA will remain °in effect (meaning this test can be used) for the duration of the °COVID-19 declaration under Section 564(b)(1) of the Act, 21 U.S.C. °section 360bbb-3(b)(1), unless the authorization is terminated or °revoked. ° °Performed at [facility], [address] [place], [place] °[address] °  °Blood culture (routine x 2)     Status: None  ° Collection Time: 07/09/21  7:00 PM  ° Specimen: BLOOD  °Result Value Ref Range Status  ° Specimen Description BLOOD LEFT ANTECUBITAL  Final  ° Special Requests   Final  °  BOTTLES DRAWN AEROBIC AND ANAEROBIC Blood Culture adequate volume  ° Culture   Final  °  NO GROWTH 5 DAYS °Performed at [facility], [address] [place], [place] [address] °  ° Report Status   07/14/2021 FINAL  Final  °Surgical pcr screen     Status: Abnormal  ° Collection Time: 07/10/21  2:09 PM  ° Specimen: Nasal Mucosa; Nasal Swab  °Result Value Ref Range Status  ° MRSA, PCR NEGATIVE NEGATIVE Final  ° Staphylococcus aureus POSITIVE (A) NEGATIVE Final  °  Comment: (NOTE) °The Xpert SA Assay (FDA approved for NASAL specimens in patients 22 °years of age and older), is one component of a comprehensive °surveillance program. It is not intended to diagnose infection nor to °guide or monitor treatment. °Performed at [facility], [address] [place], [place] °[address] °  °Fungus Culture With Stain     Status: None (Preliminary result)  ° Collection Time: 07/10/21  5:26 PM  ° Specimen: Arm, Right; Tissue  °Result Value Ref Range Status  ° Fungus Stain Final report  Final  °  Comment: (NOTE) °Performed At: [facility] °[address] [place], [place] [id number] °Nagendra Sanjai MD Ph:[phone] °  ° Fungus (Mycology) Culture PENDING  Incomplete  ° Fungal Source WOUND  Final  °  Comment:  RIGHT °ARM °Performed at [facility], [address] [place], [place] [address] °  °Aerobic/Anaerobic Culture w Gram Stain (surgical/deep wound)     Status: None  ° Collection Time: 07/10/21  5:26 PM  ° Specimen: Arm, Right; Tissue  °Result Value Ref Range Status  ° Specimen Description WOUND RIGHT ARM  Final  ° Special Requests PT ON ANCEF,CLINDAMYCIN,TXA,VANCOMYCIN,ZOSYN  Final  ° Gram Stain   Final  °  MODERATE WBC PRESENT,BOTH PMN AND MONONUCLEAR °ABUNDANT GRAM NEGATIVE RODS °ABUNDANT GRAM POSITIVE RODS °RARE GRAM POSITIVE COCCI IN PAIRS °Performed at [facility], [address] [place], [place] [address] °  ° Culture   Final  °  MODERATE STREPTOCOCCUS GROUP F °Beta hemolytic streptococci are predictably susceptible to penicillin and other beta lactams. Susceptibility testing not routinely performed. °MIXED ANAEROBIC FLORA PRESENT.  CALL LAB IF FURTHER IID REQUIRED. °  ° Report Status 07/15/2021 FINAL  Final  °Fungus Culture Result     Status: None  ° Collection Time: 07/10/21  5:26 PM  °Result Value Ref Range Status  ° Result 1 Comment  Final  °  Comment: (NOTE) °KOH/Calcofluor preparation:  no fungus observed. °Performed At: [facility] °[address] , [place] [id number] °Nagendra Sanjai MD Ph:[phone] °  °Aerobic/Anaerobic Culture w Gram Stain (surgical/deep wound)     Status: None  ° Collection Time: 07/10/21  5:29 PM  ° Specimen: Arm, Right; Tissue  °Result Value Ref Range Status  ° Specimen Description TISSUE RIGHT ARM  Final  ° Special Requests POSTERIOR PT ON ANCEF,CLINDAMYCIN,TXA,VANC,ZOSYN  Final  ° Gram Stain   Final  °  ABUNDANT WBC PRESENT,BOTH PMN AND MONONUCLEAR °ABUNDANT GRAM NEGATIVE RODS °ABUNDANT GRAM POSITIVE RODS °FEW GRAM POSITIVE COCCI IN PAIRS °Performed at [facility], [address] [place],  [address] °  ° Culture   Final  °  ABUNDANT STREPTOCOCCUS GROUP F °Beta hemolytic streptococci are predictably susceptible to penicillin and other  beta lactams. Susceptibility testing not routinely performed. °RARE STAPHYLOCOCCUS EPIDERMIDIS °MIXED ANAEROBIC FLORA PRESENT.  CALL LAB IF FURTHER IID REQUIRED. °  ° Report Status 07/15/2021 FINAL  Final  ° Organism ID, Bacteria STAPHYLOCOCCUS EPIDERMIDIS  Final  °    Susceptibility  ° Staphylococcus epidermidis - MIC*  °  CIPROFLOXACIN <=0.5 SENSITIVE Sensitive   °  ERYTHROMYCIN >=8 RESISTANT Resistant   °    GENTAMICIN <=0.5 SENSITIVE Sensitive   °  OXACILLIN <=0.25 SENSITIVE Sensitive   °  TETRACYCLINE <=1 SENSITIVE Sensitive   °  VANCOMYCIN 1 SENSITIVE Sensitive   °  TRIMETH/SULFA 160 RESISTANT Resistant   °  CLINDAMYCIN <=0.25 SENSITIVE Sensitive   °  RIFAMPIN <=0.5 SENSITIVE Sensitive   °  Inducible Clindamycin NEGATIVE Sensitive   °  * RARE STAPHYLOCOCCUS EPIDERMIDIS  °Fungus Culture With Stain     Status: None (Preliminary result)  ° Collection Time: 07/10/21  5:35 PM  ° Specimen: PATH Other; Wound  °Result Value Ref Range Status  ° Fungus Stain Final report  Final  °  Comment: (NOTE) °Performed At: [facility] °[address] [place], [place] [id number] °Nagendra Sanjai MD Ph:[phone] °  ° Fungus (Mycology) Culture PENDING  Incomplete  ° Fungal Source TISSUE  Final  °  Comment: RIGHT °ARM °LATERAL °Performed at [facility], [address] [place], [place] [address] °  °Aerobic/Anaerobic Culture w Gram Stain (surgical/deep wound)     Status: None  ° Collection Time: 07/10/21  5:35 PM  ° Specimen: PATH Other; Wound  °Result Value Ref Range Status  ° Specimen Description TISSUE RIGHT ARM  Final  ° Special Requests LATERAL SAMPLE B  Final  ° Gram Stain   Final  °  RARE WBC PRESENT, PREDOMINANTLY MONONUCLEAR °ABUNDANT GRAM NEGATIVE RODS °ABUNDANT GRAM POSITIVE RODS °RARE GRAM POSITIVE COCCI IN PAIRS °Performed at [facility], [address] [place], [place] [address] °  ° Culture   Final  °  ABUNDANT STREPTOCOCCUS GROUP F °Beta hemolytic streptococci are predictably susceptible to  penicillin and other beta lactams. Susceptibility testing not routinely performed. °RARE STAPHYLOCOCCUS HOMINIS °MIXED ANAEROBIC FLORA PRESENT.  CALL LAB IF FURTHER IID REQUIRED. °  ° Report Status 07/15/2021 FINAL  Final  ° Organism ID, Bacteria STAPHYLOCOCCUS HOMINIS  Final  °    Susceptibility  ° Staphylococcus hominis - MIC*  °  CIPROFLOXACIN <=0.5 SENSITIVE Sensitive   °  ERYTHROMYCIN >=8 RESISTANT Resistant   °  GENTAMICIN <=0.5 SENSITIVE Sensitive   °  OXACILLIN <=0.25 SENSITIVE Sensitive   °  TETRACYCLINE <=1 SENSITIVE Sensitive   °  VANCOMYCIN <=0.5 SENSITIVE Sensitive   °  TRIMETH/SULFA <=10 SENSITIVE Sensitive   °  CLINDAMYCIN <=0.25 SENSITIVE Sensitive   °  RIFAMPIN <=0.5 SENSITIVE Sensitive   °  Inducible Clindamycin NEGATIVE Sensitive   °  * RARE STAPHYLOCOCCUS HOMINIS  °Fungus Culture Result     Status: None  ° Collection Time: 07/10/21  5:35 PM  °Result Value Ref Range Status  ° Result 1 Comment  Final  °  Comment: (NOTE) °KOH/Calcofluor preparation:  no fungus observed. °Performed At: [facility] °[address] [place], [place] [id number] °Nagendra Sanjai MD Ph:[phone] °  °  ° ° ° °Scheduled Meds: ° acetaminophen  1,000 mg Oral Q6H  ° docusate sodium  100 mg Oral BID  ° feeding supplement  237 mL Oral BID BM  ° gabapentin  400 mg Oral TID  ° mirtazapine  15 mg Oral QHS  ° multivitamin with minerals  1 tablet Oral Daily  ° sertraline  200 mg Oral Daily  ° traZODone  100 mg Oral QHS  ° °Continuous Infusions: ° ampicillin-sulbactam (UNASYN) IV 3 g (07/17/21 0802)  ° ° °Principal Problem: °  Sepsis (HCC) °Active Problems: °  Heroin use disorder, moderate, dependence (HCC) °  Anemia °  Cellulitis °  Hypokalemia °  Hyponatremia °    Tobacco abuse °  Protein-calorie malnutrition, severe ° ° °Consultants: °Orthopedic team °Plastic surgery °Infectious disease ° °Procedures: °Intraoperative IND with wound VAC placement of RUE on 1/5 and 1/8 °Echocardiogram ° °Antibiotics: °Zosyn 1/4 through  1/9 °Vancomycin 1/4 through 1/7 °Cefazolin x1 dose 1/5 °Clindamycin 1/5 through 1/7 °Unasyn 1/9 >> ° ° °Time spent: 35 minutes ° ° ° °Allison Ellis ANP  °[facility] °7 am - 330 pm/M-F for direct patient care and secure chat °Please refer to Amion for contact info °8  days ° ° ° ° ° ° ° ° °  °

## 2021-07-17 NOTE — Progress Notes (Signed)
Orthopaedics Daily Progress Note   07/17/2021   12:50 PM  Justin Fisher is a 36 y.o. male 1 Day Post-Op s/p IRRIGATION AND DEBRIDEMENT UPPER EXTREMITY (third debridement)  Subjective Denies nausea, vomiting, or fevers. Pain appropriate and expected.  Objective Vitals:   07/17/21 0000 07/17/21 0730  BP: 131/75 (!) 159/81  Pulse: 80 74  Resp: 20 17  Temp: 97.9 F (36.6 C) (!) 97.4 F (36.3 C)  SpO2: 95% 99%    Intake/Output Summary (Last 24 hours) at 07/17/2021 1250 Last data filed at 07/17/2021 0900 Gross per 24 hour  Intake --  Output 950 ml  Net -950 ml    CT R forearm w/ w/o contrast 07/10/2021 did not reveal any surgically treatable abscess in forearm.  Physical Exam RUE: VAC intact holding suction Ongoing improvement in edema forearm and erythema as well as tenderness No palpable fluctuance in forearm +EDC/FDP/APB/DIO SILT M/U/R +Radial and WWP distally  Assessment 36 y.o. male s/p Procedure(s) (LRB): IRRIGATION AND DEBRIDEMENT EXTREMITY AND  VAC CHANGE (N/A)  Plan Mobility: Out of bed as tolerated Elevate right upper extremity higher than heart Continued improvement in forearm edema and erythema IV antibiotics per ID/primary team for RUE cellulitis and nonsurgical humerus osteomyelitis Pain control: Continue to wean/titrate to appropriate oral regimen DVT Prophylaxis: Per primary team Plan for Plastics OR Monday 07/21/2021 Plastics consulted and following RUE: Nonweightbearing, limited mobility due to wound vacs LUE: No restrictions RLE: No restrictions LLE: No restrictions Disposition: Pending further surgical treatment Dressing care:  Maintain wound vacs at 125 mmHg low continuous suction; elevate right upper extremity higher than heart    Ernestina Columbia M.D. Orthopaedic Surgery [facility]

## 2021-07-17 NOTE — Anesthesia Postprocedure Evaluation (Signed)
Anesthesia Post Note  Patient: Justin Fisher  Procedure(s) Performed: IRRIGATION AND DEBRIDEMENT EXTREMITY AND  VAC CHANGE     Patient location during evaluation: PACU Anesthesia Type: General Level of consciousness: sedated and patient cooperative Pain management: pain level controlled Vital Signs Assessment: post-procedure vital signs reviewed and stable Respiratory status: spontaneous breathing Cardiovascular status: stable Anesthetic complications: no   No notable events documented.  Last Vitals:  Vitals:   07/17/21 0000 07/17/21 0730  BP: 131/75 (!) 159/81  Pulse: 80 74  Resp: 20 17  Temp: 36.6 C (!) 36.3 C  SpO2: 95% 99%    Last Pain:  Vitals:   07/17/21 0805  TempSrc:   PainSc: 9                  Lewie Loron

## 2021-07-17 NOTE — Progress Notes (Signed)
Mobility Specialist Progress Note ° ° 07/17/21 1800  °Mobility  °Activity Ambulated in hall  °Level of Assistance Independent after set-up  °Assistive Device None  °Distance Ambulated (ft) 1100 ft  °Mobility Ambulated independently in hallway  °Mobility Response Tolerated well  °Mobility performed by Mobility specialist  °$Mobility charge 1 Mobility  ° °Received pt in bed having no complaints and agreeable to mobility. Requesting to us BR before session, successful void. Pt increasing activity tolerance, Asymptomatic throughout ambulation, returned back to bed w/ call bell in reach and all needs met. ° °Justin Fisher °Mobility Specialist °Phone Number [phone] ° °

## 2021-07-17 NOTE — Progress Notes (Deleted)
Pt is non-compliant with his cardiac monitoring by taking it off. Informed Dr Garner Nash via Loretha Stapler.

## 2021-07-17 NOTE — TOC Progression Note (Signed)
Transition of Care [facility]) - Progression Note    Patient Details  Name: Justin Fisher MRN: [id number] Date of Birth: February 10, 1986  Transition of Care [facility]) CM/SW [place], RN Phone Number: 07/17/2021, 11:41 AM  Clinical Narrative:    CM met with the patient in the room to discuss transitions of care.  The patient lives at home with roommates and plans to follow up with Jerral Ralph, [place], [place] clinic [phone] after discharge from the hospital for Methadone clinic follow up.  The patient states that he called and spoke with the facility yesterday and they are aware of his current hospitalization.  I called and spoke with Jerral Ralph on the phone and clinicals will be sent to the [facility] clinic closer to discharge at a later date.  [facility] sent a secure form via email to be given to Lissa Merlin, NP and discussed with the attending physician regarding methadone dosing.  The patient has prior history of drug rehabilitation through [facility] for Heroine abuse in the past.  The patient is a current smoker and Lissa Merlin, NP is aware.  The patient at this time has a wound vac intact to Right upper arm surgical site.  TOC will continue to follow the patient for DME needs closer to discharge pending determination by surgery and/or plastic surgery MD consult.  The patient will continue to receive IV antibiotics through 07/26/2021  The patient will be set up for PCP prior to discharge to home.  CM and MSW will continue to follow the patient for discharge planning to home for later date due to wound care needs and IV antibiotics.   Expected Discharge Plan: Home/Self Care Barriers to Discharge: Continued Medical Work up  Expected Discharge Plan and Services Expected Discharge Plan: Home/Self Care In-house Referral: Clinical Social Work Discharge Planning Services: CM Consult Post Acute Care Choice: Durable Medical Equipment (Patient has a [place] at the present - TOC will follow for wound vac  needs at discharge if needed per Surgery MD/Plastics MD) Living arrangements for the past 2 months: Single Family Home                                       Social Determinants of Health (SDOH) Interventions    Readmission Risk Interventions Readmission Risk Prevention Plan 07/17/2021  Transportation Screening Complete  PCP or Specialist Appt within 5-7 Days Complete  Home Care Screening Complete  Medication Review (RN CM) Complete  Some recent data might be hidden

## 2021-07-17 NOTE — Progress Notes (Signed)
No charge progress note  Patient is now on the long long length of stay list and care assumed by Ms. Junious Silk, NP.  I interviewed and examined patient this morning.  Ongoing pain issues.  Asking when methadone is going to be initiated.  Also reports anxiety, not controlled on current medications.  Young male sitting up comfortably in bed. Afebrile and vital signs stable.   Right upper extremity: Wound VAC of right upper lateral arm intact.  Diffuse right upper extremity swelling slightly better than yesterday.  Advised patient to elevate right upper extremity with 2 pillows instead of 1.  No new lab work since 1/10.  Although labs CBC and BMP were ordered for today, unclear why not drawn.  Assessment and plan  Right upper arm cellulitis/necrotizing fasciitis/acute osteomyelitis in patient with history of IVDU: S/p multiple I&D's including 1/11 with wound VAC placement.  Antibiotic regimen as prior.  Elevate right upper extremity.  Polysubstance abuse: TOC confirmed with [facility] that they will be able to manage post discharge methadone needs for patient.  I discussed with and RN with [facility] on 1/12 along with the social worker.  Patient reportedly was for started on methadone in February 2021 with a starting dose at 30 mg daily.  He was titrated up to a maximum dose of 100 mg daily in April 2022.  In July 2022, he was weaned down to a dose of 4 mg daily and then eventually left there system.  Patient now started on methadone 30 mg daily as discussed with pharmacy.  Pharmacy also recommends may uptitrate by 5 mg every 72 hours as needed.  Rest of management as per Ms. Rennis Harding, NP's note.  Marcellus Scott, MD,  FACP, [facility], [facility], [facility] (Care Management Physician Certified) [facility] [place]  To contact the attending provider between 7A-7P or the covering provider during after hours 7P-7A, please log into the web site [email or web address] and access using universal [place]  password for that web site. If you do not have the password, please call the hospital operator.

## 2021-07-18 ENCOUNTER — Inpatient Hospital Stay (HOSPITAL_COMMUNITY): Payer: BC Managed Care – PPO | Admitting: Anesthesiology

## 2021-07-18 ENCOUNTER — Encounter (HOSPITAL_COMMUNITY): Payer: Self-pay | Admitting: Internal Medicine

## 2021-07-18 ENCOUNTER — Encounter (HOSPITAL_COMMUNITY)
Admission: EM | Disposition: A | Discharge status: Home / Self Care | Payer: Self-pay | Source: Home / Self Care | Attending: Internal Medicine

## 2021-07-18 DIAGNOSIS — A021 Salmonella sepsis: Secondary | ICD-10-CM

## 2021-07-18 DIAGNOSIS — L03113 Cellulitis of right upper limb: Secondary | ICD-10-CM

## 2021-07-18 DIAGNOSIS — L02413 Cutaneous abscess of right upper limb: Secondary | ICD-10-CM

## 2021-07-18 DIAGNOSIS — F1120 Opioid dependence, uncomplicated: Secondary | ICD-10-CM | POA: Diagnosis not present

## 2021-07-18 DIAGNOSIS — S41101A Unspecified open wound of right upper arm, initial encounter: Secondary | ICD-10-CM

## 2021-07-18 DIAGNOSIS — R6520 Severe sepsis without septic shock: Secondary | ICD-10-CM

## 2021-07-18 DIAGNOSIS — E440 Moderate protein-calorie malnutrition: Secondary | ICD-10-CM | POA: Diagnosis not present

## 2021-07-18 HISTORY — PX: INCISION AND DRAINAGE OF WOUND: SHX1803

## 2021-07-18 HISTORY — PX: APPLICATION OF WOUND VAC: SHX5189

## 2021-07-18 LAB — CBC
HCT: 34.8 % — ABNORMAL LOW (ref 39.0–52.0)
Hemoglobin: 10.7 g/dL — ABNORMAL LOW (ref 13.0–17.0)
MCH: 26.1 pg (ref 26.0–34.0)
MCHC: 30.7 g/dL (ref 30.0–36.0)
MCV: 84.9 fL (ref 80.0–100.0)
Platelets: 516 10*3/uL — ABNORMAL HIGH (ref 150–400)
RBC: 4.1 MIL/uL — ABNORMAL LOW (ref 4.22–5.81)
RDW: 23 % — ABNORMAL HIGH (ref 11.5–15.5)
WBC: 11.5 10*3/uL — ABNORMAL HIGH (ref 4.0–10.5)
nRBC: 0 % (ref 0.0–0.2)

## 2021-07-18 LAB — BASIC METABOLIC PANEL
Anion gap: 8 (ref 5–15)
BUN: 12 mg/dL (ref 6–20)
CO2: 25 mmol/L (ref 22–32)
Calcium: 8.8 mg/dL — ABNORMAL LOW (ref 8.9–10.3)
Chloride: 105 mmol/L (ref 98–111)
Creatinine, Ser: 0.48 mg/dL — ABNORMAL LOW (ref 0.61–1.24)
GFR, Estimated: >60 mL/min (ref >60)
Glucose, Bld: 95 mg/dL (ref 70–99)
Potassium: 4.1 mmol/L (ref 3.5–5.1)
Sodium: 138 mmol/L (ref 135–145)

## 2021-07-18 SURGERY — IRRIGATION AND DEBRIDEMENT WOUND
Anesthesia: General | Site: Arm Upper | Laterality: Right

## 2021-07-18 MED ORDER — LIDOCAINE 2% (20 MG/ML) 5 ML SYRINGE
INTRAMUSCULAR | Status: DC | PRN
  Administered 2021-07-18: 60 mg via INTRAVENOUS

## 2021-07-18 MED ORDER — FENTANYL CITRATE (PF) 100 MCG/2ML IJ SOLN
25.0000 ug | INTRAMUSCULAR | Status: DC | PRN
  Administered 2021-07-18: 25 ug via INTRAVENOUS

## 2021-07-18 MED ORDER — GLYCOPYRROLATE PF 0.2 MG/ML IJ SOSY
PREFILLED_SYRINGE | INTRAMUSCULAR | Status: DC | PRN
  Administered 2021-07-18: .2 mg via INTRAVENOUS

## 2021-07-18 MED ORDER — OXYCODONE HCL 5 MG PO TABS
5.0000 mg | ORAL_TABLET | Freq: Once | ORAL | Status: AC | PRN
  Administered 2021-07-18: 5 mg via ORAL

## 2021-07-18 MED ORDER — CHLORHEXIDINE GLUCONATE 0.12 % MT SOLN
OROMUCOSAL | Status: AC
  Administered 2021-07-18: 15 mL
  Filled 2021-07-18: qty 15

## 2021-07-18 MED ORDER — PROPOFOL 10 MG/ML IV BOLUS
INTRAVENOUS | Status: DC | PRN
Start: 2021-07-18 — End: 2021-07-18
  Administered 2021-07-18: 2 mg via INTRAVENOUS

## 2021-07-18 MED ORDER — LIDOCAINE 2% (20 MG/ML) 5 ML SYRINGE
INTRAMUSCULAR | Status: AC
  Filled 2021-07-18: qty 5

## 2021-07-18 MED ORDER — PROMETHAZINE HCL 25 MG/ML IJ SOLN: 6.2500 mg | INTRAMUSCULAR | Status: DC | PRN

## 2021-07-18 MED ORDER — GLYCOPYRROLATE PF 0.2 MG/ML IJ SOSY
PREFILLED_SYRINGE | INTRAMUSCULAR | Status: AC
  Filled 2021-07-18: qty 1

## 2021-07-18 MED ORDER — CHLORHEXIDINE GLUCONATE 0.12 % MT SOLN: 15.0000 mL | Freq: Once | OROMUCOSAL | Status: AC

## 2021-07-18 MED ORDER — PHENYLEPHRINE 40 MCG/ML (10ML) SYRINGE FOR IV PUSH (FOR BLOOD PRESSURE SUPPORT)
PREFILLED_SYRINGE | INTRAVENOUS | Status: AC
  Filled 2021-07-18: qty 10

## 2021-07-18 MED ORDER — EPHEDRINE SULFATE-NACL 50-0.9 MG/10ML-% IV SOSY
PREFILLED_SYRINGE | INTRAVENOUS | Status: DC | PRN
  Administered 2021-07-18: 5 mg via INTRAVENOUS
  Administered 2021-07-18: 15 mg via INTRAVENOUS

## 2021-07-18 MED ORDER — FENTANYL CITRATE (PF) 250 MCG/5ML IJ SOLN
INTRAMUSCULAR | Status: DC | PRN
Start: 2021-07-18 — End: 2021-07-18
  Administered 2021-07-18 (×2): 50 ug via INTRAVENOUS

## 2021-07-18 MED ORDER — DEXMEDETOMIDINE (PRECEDEX) IN NS 20 MCG/5ML (4 MCG/ML) IV SYRINGE
PREFILLED_SYRINGE | INTRAVENOUS | Status: AC
  Filled 2021-07-18: qty 5

## 2021-07-18 MED ORDER — ONDANSETRON HCL 4 MG/2ML IJ SOLN
INTRAMUSCULAR | Status: DC | PRN
  Administered 2021-07-18: 4 mg via INTRAVENOUS

## 2021-07-18 MED ORDER — KETAMINE HCL 10 MG/ML IJ SOLN
INTRAMUSCULAR | Status: DC | PRN
  Administered 2021-07-18: 20 mg via INTRAVENOUS

## 2021-07-18 MED ORDER — CHLORHEXIDINE GLUCONATE CLOTH 2 % EX PADS: 6.0000 | MEDICATED_PAD | Freq: Once | CUTANEOUS | Status: DC

## 2021-07-18 MED ORDER — OXYCODONE HCL 5 MG/5ML PO SOLN: 5.0000 mg | Freq: Once | ORAL | Status: AC | PRN

## 2021-07-18 MED ORDER — LACTATED RINGERS IV SOLN: INTRAVENOUS | Status: DC

## 2021-07-18 MED ORDER — MIDAZOLAM HCL 2 MG/2ML IJ SOLN
INTRAMUSCULAR | Status: DC | PRN
  Administered 2021-07-18: 2 mg via INTRAVENOUS

## 2021-07-18 MED ORDER — MIDAZOLAM HCL 2 MG/2ML IJ SOLN
INTRAMUSCULAR | Status: AC
  Filled 2021-07-18: qty 2

## 2021-07-18 MED ORDER — OXYCODONE HCL 5 MG PO TABS
ORAL_TABLET | ORAL | Status: AC
  Filled 2021-07-18: qty 1

## 2021-07-18 MED ORDER — LACTATED RINGERS IV SOLN: INTRAVENOUS | Status: DC | PRN

## 2021-07-18 MED ORDER — FENTANYL CITRATE (PF) 100 MCG/2ML IJ SOLN
INTRAMUSCULAR | Status: AC
  Filled 2021-07-18: qty 2

## 2021-07-18 MED ORDER — FENTANYL CITRATE (PF) 250 MCG/5ML IJ SOLN
INTRAMUSCULAR | Status: AC
  Filled 2021-07-18: qty 5

## 2021-07-18 MED ORDER — CEFAZOLIN SODIUM-DEXTROSE 2-4 GM/100ML-% IV SOLN
2.0000 g | INTRAVENOUS | Status: AC
  Administered 2021-07-18: 2 g via INTRAVENOUS
  Filled 2021-07-18: qty 100

## 2021-07-18 MED ORDER — ONDANSETRON HCL 4 MG/2ML IJ SOLN
INTRAMUSCULAR | Status: AC
  Filled 2021-07-18: qty 2

## 2021-07-18 MED ORDER — 0.9 % SODIUM CHLORIDE (POUR BTL) OPTIME
TOPICAL | Status: DC | PRN
  Administered 2021-07-18: 2000 mL

## 2021-07-18 MED ORDER — DEXMEDETOMIDINE (PRECEDEX) IN NS 20 MCG/5ML (4 MCG/ML) IV SYRINGE
PREFILLED_SYRINGE | INTRAVENOUS | Status: DC | PRN
  Administered 2021-07-18: 8 ug via INTRAVENOUS

## 2021-07-18 MED ORDER — AMISULPRIDE (ANTIEMETIC) 5 MG/2ML IV SOLN: 10.0000 mg | Freq: Once | INTRAVENOUS | Status: DC | PRN

## 2021-07-18 MED ORDER — PROPOFOL 10 MG/ML IV BOLUS
INTRAVENOUS | Status: AC
  Filled 2021-07-18: qty 20

## 2021-07-18 MED ORDER — EPHEDRINE 5 MG/ML INJ
INTRAVENOUS | Status: AC
  Filled 2021-07-18: qty 5

## 2021-07-18 MED ORDER — ORAL CARE MOUTH RINSE: 15.0000 mL | Freq: Once | OROMUCOSAL | Status: AC

## 2021-07-18 MED ORDER — PHENYLEPHRINE 40 MCG/ML (10ML) SYRINGE FOR IV PUSH (FOR BLOOD PRESSURE SUPPORT)
PREFILLED_SYRINGE | INTRAVENOUS | Status: DC | PRN
  Administered 2021-07-18 (×2): 200 ug via INTRAVENOUS
  Administered 2021-07-18: 160 ug via INTRAVENOUS
  Administered 2021-07-18: 120 ug via INTRAVENOUS

## 2021-07-18 SURGICAL SUPPLY — 59 items
APPLICATOR COTTON TIP 6 STRL (MISCELLANEOUS) IMPLANT
APPLICATOR COTTON TIP 6IN STRL (MISCELLANEOUS) IMPLANT
BAG COUNTER SPONGE SURGICOUNT (BAG) ×2 IMPLANT
BAG DECANTER FOR FLEXI CONT (MISCELLANEOUS) IMPLANT
BENZOIN TINCTURE PRP APPL 2/3 (GAUZE/BANDAGES/DRESSINGS) ×2 IMPLANT
BNDG COHESIVE 4X5 TAN ST LF (GAUZE/BANDAGES/DRESSINGS) ×1 IMPLANT
CANISTER SUCT 3000ML PPV (MISCELLANEOUS) ×2 IMPLANT
CANISTER WOUNDNEG PRESSURE 500 (CANNISTER) ×1 IMPLANT
CNTNR URN SCR LID CUP LEK RST (MISCELLANEOUS) IMPLANT
CONT SPEC 4OZ STRL OR WHT (MISCELLANEOUS)
COVER SURGICAL LIGHT HANDLE (MISCELLANEOUS) ×2 IMPLANT
DRAIN CHANNEL 19F RND (DRAIN) IMPLANT
DRAIN JP 10F RND SILICONE (MISCELLANEOUS) IMPLANT
DRAPE DERMATAC (DRAPES) ×1 IMPLANT
DRAPE HALF SHEET 40X57 (DRAPES) IMPLANT
DRAPE IMP U-DRAPE 54X76 (DRAPES) ×2 IMPLANT
DRAPE INCISE IOBAN 66X45 STRL (DRAPES) ×1 IMPLANT
DRAPE LAPAROSCOPIC ABDOMINAL (DRAPES) IMPLANT
DRAPE LAPAROTOMY 100X72 PEDS (DRAPES) ×2 IMPLANT
DRSG ADAPTIC 3X8 NADH LF (GAUZE/BANDAGES/DRESSINGS) IMPLANT
DRSG CUTIMED SORBACT 7X9 (GAUZE/BANDAGES/DRESSINGS) ×1 IMPLANT
DRSG PAD ABDOMINAL 8X10 ST (GAUZE/BANDAGES/DRESSINGS) IMPLANT
DRSG VAC ATS LRG SENSATRAC (GAUZE/BANDAGES/DRESSINGS) ×1 IMPLANT
DRSG VAC ATS MED SENSATRAC (GAUZE/BANDAGES/DRESSINGS) IMPLANT
DRSG VAC ATS SM SENSATRAC (GAUZE/BANDAGES/DRESSINGS) IMPLANT
ELECT CAUTERY BLADE 6.4 (BLADE) IMPLANT
ELECT REM PT RETURN 9FT ADLT (ELECTROSURGICAL) ×2
ELECTRODE REM PT RTRN 9FT ADLT (ELECTROSURGICAL) ×1 IMPLANT
GAUZE SPONGE 4X4 12PLY STRL (GAUZE/BANDAGES/DRESSINGS) IMPLANT
GLOVE SURG ENC MOIS LTX SZ6.5 (GLOVE) ×2 IMPLANT
GLOVE SURG ENC TEXT LTX SZ6.5 (GLOVE) ×2 IMPLANT
GOWN STRL REUS W/ TWL LRG LVL3 (GOWN DISPOSABLE) ×3 IMPLANT
GOWN STRL REUS W/TWL LRG LVL3 (GOWN DISPOSABLE) ×3
KIT BASIN OR (CUSTOM PROCEDURE TRAY) ×2 IMPLANT
KIT TURNOVER KIT B (KITS) ×2 IMPLANT
MATRIX WOUND 3-LAYER 10X15 (Tissue) ×1 IMPLANT
MATRIX WOUND 3-LAYER 7X10 (Tissue) ×2 IMPLANT
MICROMATRIX 1000MG (Tissue) ×6 IMPLANT
NDL HYPO 25GX1X1/2 BEV (NEEDLE) ×1 IMPLANT
NEEDLE HYPO 25GX1X1/2 BEV (NEEDLE) ×2 IMPLANT
NS IRRIG 1000ML POUR BTL (IV SOLUTION) ×2 IMPLANT
PACK GENERAL/GYN (CUSTOM PROCEDURE TRAY) ×2 IMPLANT
PACK UNIVERSAL I (CUSTOM PROCEDURE TRAY) ×2 IMPLANT
PAD ARMBOARD 7.5X6 YLW CONV (MISCELLANEOUS) ×4 IMPLANT
SOLUTION PARTIC MCRMTRX 1000MG (Tissue) IMPLANT
STAPLER VISISTAT 35W (STAPLE) ×2 IMPLANT
STOCKINETTE IMPERVIOUS LG (DRAPES) ×1 IMPLANT
SURGILUBE 2OZ TUBE FLIPTOP (MISCELLANEOUS) IMPLANT
SUT MNCRL AB 3-0 PS2 27 (SUTURE) IMPLANT
SUT MNCRL AB 4-0 PS2 18 (SUTURE) IMPLANT
SUT MON AB 2-0 CT1 36 (SUTURE) IMPLANT
SUT MON AB 5-0 PS2 18 (SUTURE) IMPLANT
SUT VIC AB 5-0 PS2 18 (SUTURE) IMPLANT
SUT VICRYL 3 0 (SUTURE) IMPLANT
SWAB COLLECTION DEVICE MRSA (MISCELLANEOUS) IMPLANT
SWAB CULTURE ESWAB REG 1ML (MISCELLANEOUS) IMPLANT
SYR CONTROL 10ML LL (SYRINGE) ×2 IMPLANT
TOWEL GREEN STERILE (TOWEL DISPOSABLE) ×2 IMPLANT
UNDERPAD 30X36 HEAVY ABSORB (UNDERPADS AND DIAPERS) ×2 IMPLANT

## 2021-07-18 NOTE — Progress Notes (Signed)
[facility] PROGRESS NOTE  Justin Fisher HYI:[id number] DOB: [birth year] DOA: 07/09/2021 PCP: Patient, No Pcp Per (Inactive)  Status: Remains inpatient appropriate because:  Unsafe discharge plan noting requires IV antibiotics for current medical condition history of IVD use not a candidate for home IV therapy; also requiring frequent reevaluation by surgical team regarding wound on upper right arm that is also requiring wound VAC therapy  Barriers to discharge: Social:Known history of active IV drug abuse prior to admission  Clinical: Continues to require IV antibiotics and regular wound therapy for wound upper extremity.  May require additional surgical procedures including plastic surgery  Level of care:  Med-Surg   Code Status: Full Family Communication: Patient only DVT prophylaxis: SCDs COVID vaccination status: Unknown    HPI: 36 year old M with PMH of IVDU (heroin, fentanyl and meth), anxiety, HLD, suicidal ideation and tobacco use disorder presenting to ED on 07/09/2021 with progressive severe right upper extremity pain, swelling, oozing brown and foul-smelling liquid after injecting IV drugs using a needle with blood in it, and admitted for sepsis due to right upper arm cellulitis/neck fasciitis/abscess/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 orthopedic surgery and ID consulted.  Initially started on vancomycin, Zosyn and clindamycin.  He underwent I&D with VAC placement on 1/5 and 1/8.  Blood cultures NGTD.  Surgical culture with group F Streptococcus.  Antibiotics de-escalated to IV Zosyn.  ID, orthopedic surgery and plastic surgery following.   Hospital course complicated by acute blood loss anemia related to surgery.  He had about 2 L blood transfused from 1/8-1/9.  Subjective: Awake and reporting decreased anxiety the addition of methadone.  Somewhat fixated on rapidly titrating dose up to a previous dose of 110 mg daily.  Discussed with patient that typically  that dose is reserved for people who are actively using at the time they start since the methadone can compete with the illegal narcotic; I informed him this information came from [place] with [facility].  I educated him less recommended increasing by no more than 5 mg every 72 hours given the long-acting pharmacology of this medication.  Objective: Vitals:   07/17/21 2042 07/18/21 0453  BP: 120/71 119/69  Pulse: 78 77  Resp: 19 20  Temp: 97.9 F (36.6 C) 98.8 F (37.1 C)  SpO2: 98% 97%    Intake/Output Summary (Last 24 hours) at 07/18/2021 0755 Last data filed at 07/18/2021 0456 Gross per 24 hour  Intake 2139.05 ml  Output 2350 ml  Net -210.95 ml   Filed Weights   07/10/21 2308  Weight: 73.4 kg    Exam:  Constitutional: NAD, calm but does report anxiety, comfortable Respiratory: clear to auscultation bilaterally, no wheezing, no crackles. Normal respiratory effort. No accessory muscle use. RA Cardiovascular: Regular rate and rhythm, no murmurs / rubs / gallops. No lower extremity edema. 2+ pedal pulses.  Does have edema right upper extremity in relation to recent infection and surgical procedures. Abdomen: no tenderness, no masses palpated. Bowel sounds positive. LBM 1/12 Skin: no rashes, lesions, ulcers. No induration.  Does have wound VAC over upper right arm over surgical wound Neurologic: CN 2-12 grossly intact. Sensation intact, Strength 5/5 x all 4 extremities.  Psychiatric: Normal judgment and insight. Alert and oriented x 3.  Anxious mood.    Assessment/Plan: Acute problems: Right upper arm cellulitis/neck fasciitis/acute osteomyelitis and patient with history of IVDU -Superficial wound culture positive for moderate strep group F with intraoperative surgical culture from 1/5 with moderate strep group F; blood cultures negative -CRP  1.6.  ESR 20. -S/p I&D on 1/5 and 1/8 with wound VAC placement -Orthopedic surgery, infectious disease and plastic surgery following. -IV Vanco,  Zosyn and clinda>> IV Zosyn>> IV Unasyn until 07/26/2021 then p.o. Augmentin until 08/23/2021 -Since methadone is being initiated I have adjusted pain medications as follows: We will continue IV Dilaudid for severe breakthrough pain.  Have discontinued higher dose of Oxy IR that was added for severe pain and replace this with a 10 mg dose every 4 hours for moderate breakthrough pain -S/p I&D and wound VAC change on 1/11.   Polysubstance use/IVDU including heroin, cocaine, fentanyl, meth and tobacco -UDS positive for cocaine on admission.  HIV nonreactive.  Hepatitis C antibody positive. -Encourage cessation. -Continue nicotine patch -Hep B vaccine -Outpatient follow-up with [facility] upon discharge. -1/12 initiated methadone 30 mg daily based on recommendations from [facility] clinic (Spoke w/ Jerral Ralph) and [place].  Can go up to 5 mg every 72 hours if necessary to achieve therapeutic effect   Acute blood loss anemia superimposed on chronic iron deficiency anemia -Hgb 15.5 in 2018.   -Patient was given 1 unit of PRBCs on 1/5 and a total of 4 units of PRBCs on 1/8 -Current hemoglobin 10.7   Uncontrolled hypertension:  Not on medication at home. -Continue hydralazine as needed   Hyponatremia:  Resolved   Hypokalemia:  Resolved   History of anxiety/depression:  Stable. -Primarily on Vistaril at home with current dosage ineffective therefore will increase frequency to every 6 hours as needed -Discussed with [facility] and per my thoughts also stated would not utilize benzodiazepines but felt that resumption of methadone would be quite helpful in improving patient's level of anxiety   Leukocytosis/bandemia/thrombocytosis:  Likely reactive due to #1. -Continue monitoring   Hepatitis C antibody positive -Follow quantitative RNA      Data Reviewed: Basic Metabolic Panel: Recent Labs  Lab 07/12/21 0648 07/13/21 0325 07/14/21 0323 07/15/21 0245 07/18/21 0601  NA 132* 137 141 140  138  K 3.3* 3.7 3.5 3.6 4.1  CL 104 107 106 107 105  CO2 24 25 25 23 25   GLUCOSE 141* 126* 117* 114* 95  BUN 8 12 15 10 12   CREATININE 0.42* 0.56* 0.57* 0.53* 0.48*  CALCIUM 7.7* 7.9* 7.9* 8.1* 8.8*  MG  --   --   --  1.8  --   PHOS  --   --   --  3.6  --    Liver Function Tests: Recent Labs  Lab 07/15/21 0245  ALBUMIN 2.2*    CBC: Recent Labs  Lab 07/12/21 0648 07/13/21 0325 07/14/21 0323 07/15/21 0245 07/18/21 0601  WBC 14.7* 16.0* 16.8* 14.3* 11.5*  NEUTROABS 6.5 7.4  --   --   --   HGB 7.5* 6.3* 10.4* 10.7* 10.7*  HCT 24.1* 20.4* 32.9* 33.1* 34.8*  MCV 73.7* 76.1* 79.7* 80.9 84.9  PLT 541* 474* 530* 520* 516*   Cardiac Enzymes: Recent Labs  Lab 07/15/21 0245  CKTOTAL 23*    Recent Results (from the past 240 hour(s))  Blood culture (routine x 2)     Status: None   Collection Time: 07/09/21  2:52 PM   Specimen: BLOOD LEFT ARM  Result Value Ref Range Status   Specimen Description BLOOD LEFT ARM  Final   Special Requests   Final    BOTTLES DRAWN AEROBIC AND ANAEROBIC Blood Culture adequate volume   Culture   Final    NO GROWTH 5 DAYS Performed at  [facility], [place] [address], [place], [place] [address]    Report Status 07/14/2021 FINAL  Final  Aerobic Culture w Gram Stain (superficial specimen)     Status: None   Collection Time: 07/09/21  6:22 PM   Specimen: Abscess  Result Value Ref Range Status   Specimen Description ABSCESS  Final   Special Requests Normal  Final   Gram Stain   Final    ABUNDANT WBC PRESENT,BOTH PMN AND MONONUCLEAR MODERATE GRAM VARIABLE ROD MODERATE GRAM POSITIVE COCCI    Culture   Final    MODERATE STREPTOCOCCUS GROUP F Beta hemolytic streptococci are predictably susceptible to penicillin and other beta lactams. Susceptibility testing not routinely performed. Performed at [facility], [place] [address] [place], [place] [address]    Report Status 07/11/2021 FINAL  Final  Resp Panel by RT-PCR (Flu A&B, Covid)      Status: None   Collection Time: 07/09/21  6:33 PM   Specimen: Nasopharyngeal(NP) swabs in vial transport medium  Result Value Ref Range Status   SARS Coronavirus 2 by RT PCR NEGATIVE NEGATIVE Final    Comment: (NOTE) SARS-CoV-2 target nucleic acids are NOT DETECTED.  The SARS-CoV-2 RNA is generally detectable in upper respiratory specimens during the acute phase of infection. The lowest concentration of SARS-CoV-2 viral copies this assay can detect is 138 copies/mL. A negative result does not preclude SARS-Cov-2 infection and should not be used as the sole basis for treatment or other patient management decisions. A negative result may occur with  improper specimen collection/handling, submission of specimen other than nasopharyngeal swab, presence of viral mutation(s) within the areas targeted by this assay, and inadequate number of viral copies(<138 copies/mL). A negative result must be combined with clinical observations, patient history, and epidemiological information. The expected result is Negative.  Fact Sheet for Patients:  EntrepreneurPulse.com.au  Fact Sheet for Healthcare Providers:  IncredibleEmployment.be  This test is no t yet approved or cleared by the [place] FDA and  has been authorized for detection and/or diagnosis of SARS-CoV-2 by FDA under an Emergency Use Authorization (EUA). This EUA will remain  in effect (meaning this test can be used) for the duration of the COVID-19 declaration under Section 564(b)(1) of the Act, 21 U.S.C.section 360bbb-3(b)(1), unless the authorization is terminated  or revoked sooner.       Influenza A by PCR NEGATIVE NEGATIVE Final   Influenza B by PCR NEGATIVE NEGATIVE Final    Comment: (NOTE) The Xpert Xpress SARS-CoV-2/FLU/RSV plus assay is intended as an aid in the diagnosis of influenza from Nasopharyngeal swab specimens and should not be used as a sole basis for treatment. Nasal  washings and aspirates are unacceptable for Xpert Xpress SARS-CoV-2/FLU/RSV testing.  Fact Sheet for Patients: EntrepreneurPulse.com.au  Fact Sheet for Healthcare Providers: IncredibleEmployment.be  This test is not yet approved or cleared by the [place] FDA and has been authorized for detection and/or diagnosis of SARS-CoV-2 by FDA under an Emergency Use Authorization (EUA). This EUA will remain in effect (meaning this test can be used) for the duration of the COVID-19 declaration under Section 564(b)(1) of the Act, 21 U.S.C. section 360bbb-3(b)(1), unless the authorization is terminated or revoked.  Performed at [facility], [place] [address] [place], [place] [address]   Blood culture (routine x 2)     Status: None   Collection Time: 07/09/21  7:00 PM   Specimen: BLOOD  Result Value Ref Range Status   Specimen Description  BLOOD LEFT ANTECUBITAL  Final   Special Requests   Final    BOTTLES DRAWN AEROBIC AND ANAEROBIC Blood Culture adequate volume   Culture   Final    NO GROWTH 5 DAYS Performed at [facility], [address] [place], [place] [address]    Report Status 07/14/2021 FINAL  Final  Surgical pcr screen     Status: Abnormal   Collection Time: 07/10/21  2:09 PM   Specimen: Nasal Mucosa; Nasal Swab  Result Value Ref Range Status   MRSA, PCR NEGATIVE NEGATIVE Final   Staphylococcus aureus POSITIVE (A) NEGATIVE Final    Comment: (NOTE) The Xpert SA Assay (FDA approved for NASAL specimens in patients 52 years of age and older), is one component of a comprehensive surveillance program. It is not intended to diagnose infection nor to guide or monitor treatment. Performed at [facility], [place] [address], [place], [place] [address]   Fungus Culture With Stain     Status: None (Preliminary result)   Collection Time: 07/10/21  5:26 PM   Specimen: Arm, Right; Tissue  Result Value Ref Range Status   Fungus  Stain Final report  Final    Comment: (NOTE) Performed At: [facility] [place], [place] [id number] Rush Farmer MD OT:[id number]    Fungus (Mycology) Culture PENDING  Incomplete   Fungal Source WOUND  Final    Comment: RIGHT ARM Performed at [facility], [place] [address] [place], [place] [address]   Aerobic/Anaerobic Culture w Gram Stain (surgical/deep wound)     Status: None   Collection Time: 07/10/21  5:26 PM   Specimen: Arm, Right; Tissue  Result Value Ref Range Status   Specimen Description WOUND RIGHT ARM  Final   Special Requests PT ON ANCEF,CLINDAMYCIN,TXA,VANCOMYCIN,ZOSYN  Final   Gram Stain   Final    MODERATE WBC PRESENT,BOTH PMN AND MONONUCLEAR ABUNDANT GRAM NEGATIVE RODS ABUNDANT GRAM POSITIVE RODS RARE GRAM POSITIVE COCCI IN PAIRS Performed at [facility], [place] [address], [place], [place] [address]    Culture   Final    MODERATE STREPTOCOCCUS GROUP F Beta hemolytic streptococci are predictably susceptible to penicillin and other beta lactams. Susceptibility testing not routinely performed. MIXED ANAEROBIC FLORA PRESENT.  CALL LAB IF FURTHER IID REQUIRED.    Report Status 07/15/2021 FINAL  Final  Fungus Culture Result     Status: None   Collection Time: 07/10/21  5:26 PM  Result Value Ref Range Status   Result 1 Comment  Final    Comment: (NOTE) KOH/Calcofluor preparation:  no fungus observed. Performed At: [facility] [place], [place] [id number] Rush Farmer MD TX:[id number]   Aerobic/Anaerobic Culture w Gram Stain (surgical/deep wound)     Status: None   Collection Time: 07/10/21  5:29 PM   Specimen: Arm, Right; Tissue  Result Value Ref Range Status   Specimen Description TISSUE RIGHT ARM  Final   Special Requests POSTERIOR PT ON ANCEF,CLINDAMYCIN,TXA,VANC,ZOSYN  Final   Gram Stain   Final    ABUNDANT WBC PRESENT,BOTH PMN AND MONONUCLEAR ABUNDANT GRAM NEGATIVE RODS ABUNDANT GRAM POSITIVE  RODS FEW GRAM POSITIVE COCCI IN PAIRS Performed at [facility], [place] [address] [place], [place] [address]    Culture   Final    ABUNDANT STREPTOCOCCUS GROUP F Beta hemolytic streptococci are predictably susceptible to penicillin and other beta lactams. Susceptibility testing not routinely performed. RARE STAPHYLOCOCCUS EPIDERMIDIS MIXED ANAEROBIC FLORA PRESENT.  CALL LAB IF FURTHER IID REQUIRED.    Report Status 07/15/2021 FINAL  Final   Organism ID, Bacteria STAPHYLOCOCCUS EPIDERMIDIS  Final      Susceptibility   Staphylococcus epidermidis - MIC*    CIPROFLOXACIN <=0.5 SENSITIVE Sensitive     ERYTHROMYCIN >=8 RESISTANT Resistant     GENTAMICIN <=0.5 SENSITIVE Sensitive     OXACILLIN <=0.25 SENSITIVE Sensitive     TETRACYCLINE <=1 SENSITIVE Sensitive     VANCOMYCIN 1 SENSITIVE Sensitive     TRIMETH/SULFA 160 RESISTANT Resistant     CLINDAMYCIN <=0.25 SENSITIVE Sensitive     RIFAMPIN <=0.5 SENSITIVE Sensitive     Inducible Clindamycin NEGATIVE Sensitive     * RARE STAPHYLOCOCCUS EPIDERMIDIS  Fungus Culture With Stain     Status: None (Preliminary result)   Collection Time: 07/10/21  5:35 PM   Specimen: PATH Other; Wound  Result Value Ref Range Status   Fungus Stain Final report  Final    Comment: (NOTE) Performed At: [facility] [place], [place] [id number] Rush Farmer MD OV:[id number]    Fungus (Mycology) Culture PENDING  Incomplete   Fungal Source TISSUE  Final    Comment: RIGHT ARM LATERAL Performed at [facility], [place] [address] [place], [place] [address]   Aerobic/Anaerobic Culture w Gram Stain (surgical/deep wound)     Status: None   Collection Time: 07/10/21  5:35 PM   Specimen: PATH Other; Wound  Result Value Ref Range Status   Specimen Description TISSUE RIGHT ARM  Final   Special Requests LATERAL SAMPLE B  Final   Gram Stain   Final    RARE WBC PRESENT, PREDOMINANTLY MONONUCLEAR ABUNDANT GRAM NEGATIVE RODS ABUNDANT  GRAM POSITIVE RODS RARE GRAM POSITIVE COCCI IN PAIRS Performed at [facility], [place] [address], [place],  [address]    Culture   Final    ABUNDANT STREPTOCOCCUS GROUP F Beta hemolytic streptococci are predictably susceptible to penicillin and other beta lactams. Susceptibility testing not routinely performed. RARE STAPHYLOCOCCUS HOMINIS MIXED ANAEROBIC FLORA PRESENT.  CALL LAB IF FURTHER IID REQUIRED.    Report Status 07/15/2021 FINAL  Final   Organism ID, Bacteria STAPHYLOCOCCUS HOMINIS  Final      Susceptibility   Staphylococcus hominis - MIC*    CIPROFLOXACIN <=0.5 SENSITIVE Sensitive     ERYTHROMYCIN >=8 RESISTANT Resistant     GENTAMICIN <=0.5 SENSITIVE Sensitive     OXACILLIN <=0.25 SENSITIVE Sensitive     TETRACYCLINE <=1 SENSITIVE Sensitive     VANCOMYCIN <=0.5 SENSITIVE Sensitive     TRIMETH/SULFA <=10 SENSITIVE Sensitive     CLINDAMYCIN <=0.25 SENSITIVE Sensitive     RIFAMPIN <=0.5 SENSITIVE Sensitive     Inducible Clindamycin NEGATIVE Sensitive     * RARE STAPHYLOCOCCUS HOMINIS  Fungus Culture Result     Status: None   Collection Time: 07/10/21  5:35 PM  Result Value Ref Range Status   Result 1 Comment  Final    Comment: (NOTE) KOH/Calcofluor preparation:  no fungus observed. Performed At: [facility] [place], [place] [id number] Rush Farmer MD EZ:[id number]        Scheduled Meds:  Chlorhexidine Gluconate Cloth  6 each Topical Once   And   Chlorhexidine Gluconate Cloth  6 each Topical Once   docusate sodium  100 mg Oral BID   feeding supplement  237 mL Oral BID BM   gabapentin  400 mg Oral TID   methadone  30 mg Oral  Daily   mirtazapine  30 mg Oral QHS   multivitamin with minerals  1 tablet Oral Daily   sertraline  200 mg Oral Daily   traZODone  100 mg Oral QHS   Continuous Infusions:  ampicillin-sulbactam (UNASYN) IV 3 g (07/18/21 0152)    ceFAZolin (ANCEF) IV      Principal Problem:   Sepsis ([place]) Active  Problems:   Heroin use disorder, moderate, dependence (HCC)   Anemia   Cellulitis   Hypokalemia   Hyponatremia   Tobacco abuse   Protein-calorie malnutrition, severe   Malnutrition of moderate degree   Abscess of right arm   Consultants: Orthopedic team Plastic surgery Infectious disease  Procedures: Intraoperative IND with wound VAC placement of RUE on 1/5 and 1/8 Echocardiogram  Antibiotics: Zosyn 1/4 through 1/9 Vancomycin 1/4 through 1/7 Cefazolin x1 dose 1/5 Clindamycin 1/5 through 1/7 Unasyn 1/9 >>   Time spent: 35 minutes    Erin Hearing ANP  [facility] 7 am - 330 pm/M-F for direct patient care and secure chat Please refer to Amion for contact info 9  days

## 2021-07-18 NOTE — Anesthesia Preprocedure Evaluation (Addendum)
Anesthesia Evaluation  Patient identified by MRN, date of birth, ID band Patient awake    Reviewed: Allergy & Precautions, H&P , NPO status , Patient's Chart, lab work & pertinent test results  Airway Mallampati: II  TM Distance: >3 FB Neck ROM: Full    Dental no notable dental hx. (+) Dental Advisory Given, Teeth Intact   Pulmonary neg pulmonary ROS, Current Smoker and Patient abstained from smoking.,    Pulmonary exam normal breath sounds clear to auscultation       Cardiovascular Exercise Tolerance: Good negative cardio ROS Normal cardiovascular exam Rhythm:Regular Rate:Normal     Neuro/Psych PSYCHIATRIC DISORDERS Anxiety Depression negative neurological ROS     GI/Hepatic negative GI ROS, (+)     substance abuse  IV drug use,   Endo/Other  negative endocrine ROS  Renal/GU negative Renal ROS  negative genitourinary   Musculoskeletal negative musculoskeletal ROS (+)   Abdominal   Peds negative pediatric ROS (+)  Hematology  (+) Blood dyscrasia, anemia ,   Anesthesia Other Findings   Reproductive/Obstetrics                             Anesthesia Physical Anesthesia Plan  ASA: 2 and emergent  Anesthesia Plan: General   Post-op Pain Management:    Induction: Intravenous  PONV Risk Score and Plan: 1 and Treatment may vary due to age or medical condition, Ondansetron, Dexamethasone and Midazolam  Airway Management Planned: LMA  Additional Equipment: None  Intra-op Plan:   Post-operative Plan:   Informed Consent: I have reviewed the patients History and Physical, chart, labs and discussed the procedure including the risks, benefits and alternatives for the proposed anesthesia with the patient or authorized representative who has indicated his/her understanding and acceptance.     Dental advisory given  Plan Discussed with: Anesthesiologist and CRNA  Anesthesia Plan  Comments:        Anesthesia Quick Evaluation

## 2021-07-18 NOTE — Progress Notes (Signed)
Nutrition Follow-up  DOCUMENTATION CODES:   Non-severe (moderate) malnutrition in context of social or environmental circumstances  INTERVENTION:  -continue Ensure Enlive po BID, each supplement provides 350 kcal and 20 grams of protein -continue MVI with minerals daily  NUTRITION DIAGNOSIS:   Moderate Malnutrition related to social / environmental circumstances (IV drug use) as evidenced by mild fat depletion, moderate muscle depletion.  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  1/05 s/p I&D R arm w/ VAC placement  1/08 s/p I&D R arm w/ VAC placement 1/11 s/p I&D R arm w/ VAC placement  Pt unavailable at time of follow-up -- having I&D w/ VAC placement per RN. No PO intake documented since last RD assessment. Variable supplement acceptance per MAR. RD will follow-up as able to assess need for changes to the nutrition plan of care.   UOP: 2139ml x24 hours woundVAC: 259ml x24 hours I/O: -4945ml since admit  Medications: Scheduled Meds:  docusate sodium  100 mg Oral BID   feeding supplement  237 mL Oral BID BM   fentaNYL       gabapentin  400 mg Oral TID   methadone  30 mg Oral Daily   mirtazapine  30 mg Oral QHS   multivitamin with minerals  1 tablet Oral Daily   oxyCODONE       sertraline  200 mg Oral Daily   traZODone  100 mg Oral QHS  Continuous Infusions:  ampicillin-sulbactam (UNASYN) IV 3 g (07/18/21 0926)    Labs: Recent Labs  Lab 07/14/21 0323 07/15/21 0245 07/18/21 0601  NA 141 140 138  K 3.5 3.6 4.1  CL 106 107 105  CO2 25 23 25   BUN 15 10 12   CREATININE 0.57* 0.53* 0.48*  CALCIUM 7.9* 8.1* 8.8*  MG  --  1.8   --   PHOS  --  3.6  --   GLUCOSE 117* 114* 95    Diet Order:   Diet Order             Diet regular Room service appropriate? Yes; Fluid consistency: Thin  Diet effective now                   EDUCATION NEEDS:   Education needs have been addressed  Skin:  Skin Assessment: Skin Integrity Issues: Skin Integrity Issues:: Incisions Incisions: R arm  Last BM:  1/12  Height:   Ht Readings from Last 1 Encounters:  07/10/21 5\' 7"  (1.702 m)    Weight:   Wt Readings from Last 1 Encounters:  07/10/21 73.4 kg    BMI:  Body mass index is 25.34 kg/m.  Estimated Nutritional Needs:   Kcal:  2200-2400  Protein:  110-130g  Fluid:  >/=2.2L     Theone Stanley., MS, RD, LDN (she/her/hers) RD pager number and weekend/on-call pager number located in [place].

## 2021-07-18 NOTE — Plan of Care (Signed)
  Problem: Clinical Measurements: Goal: Ability to maintain clinical measurements within normal limits will improve Outcome: Progressing   

## 2021-07-18 NOTE — H&P (Signed)
Justin Fisher is an 36 y.o. male.   Chief Complaint: right arm wound HPI: The patient is a 36 yrs old wm here for treatment of  his right arm wound.  He had an episode of illicit drug use and noticed redness and tenderness in the upper right arm.  He then had severe pain and a wound develop.  He has been treated with several debridements and presents for further care.  Past Medical History:  Diagnosis Date   Anxiety    Suicidal thoughts     Past Surgical History:  Procedure Laterality Date   APPLICATION OF WOUND VAC Right 07/10/2021   Procedure: APPLICATION OF WOUND VAC;  Surgeon: Georgeanna Harrison, MD;  Location: [facility];  Service: Orthopedics;  Laterality: Right;   I & D EXTREMITY Right 07/10/2021   Procedure: IRRIGATION AND DEBRIDEMENT RIGHT UPPER ARM;  Surgeon: Georgeanna Harrison, MD;  Location: [facility];  Service: Orthopedics;  Laterality: Right;   I & D EXTREMITY Right 07/13/2021   Procedure: IRRIGATION AND DEBRIDEMENT UPPER EXTREMITY;  Surgeon: Melrose Nakayama, MD;  Location: [facility];  Service: Orthopedics;  Laterality: Right;   I & D EXTREMITY N/A 07/16/2021   Procedure: IRRIGATION AND DEBRIDEMENT EXTREMITY AND  VAC CHANGE;  Surgeon: Georgeanna Harrison, MD;  Location: [facility];  Service: Orthopedics;  Laterality: N/A;   SHOULDER SURGERY Left     History reviewed. No pertinent family history. Social History:  reports that he has been smoking cigarettes. He has been smoking an average of .5 packs per day. He has never used smokeless tobacco. He reports current alcohol use. He reports current drug use. Drug: IV.  Allergies: No Known Allergies  Medications Prior to Admission  Medication Sig Dispense Refill   acetaminophen (TYLENOL) 500 MG tablet Take 1,000 mg by mouth every 6 (six) hours as needed for moderate pain or headache.     gabapentin (NEURONTIN) 300 MG capsule Take 300 mg by mouth 3 (three) times daily.     hydrOXYzine (VISTARIL) 50 MG capsule Take 50 mg by mouth 3 (three) times daily as  needed for itching.     ibuprofen (ADVIL) 200 MG tablet Take 600 mg by mouth every 6 (six) hours as needed for mild pain or headache.     methocarbamol (ROBAXIN) 750 MG tablet Take 750 mg by mouth 2 (two) times daily as needed for muscle spasms.     mirtazapine (REMERON) 15 MG tablet Take 15 mg by mouth at bedtime.     sertraline (ZOLOFT) 100 MG tablet Take 2 tablets (200 mg total) by mouth daily. For depression 60 tablet 0   traZODone (DESYREL) 100 MG tablet Take 1 tablet (100 mg total) by mouth at bedtime as needed for sleep (may repeat x 1). For sleep (Patient taking differently: Take 100 mg by mouth at bedtime. For sleep) 14 tablet 0   gabapentin (NEURONTIN) 400 MG capsule Take 1 capsule (400 mg total) by mouth 3 (three) times daily. For agitation (Patient not taking: Reported on 07/09/2021) 90 capsule 0   hydrOXYzine (ATARAX/VISTARIL) 50 MG tablet Take 1 tablet (50 mg total) by mouth every 6 (six) hours as needed for anxiety. (Patient not taking: Reported on 07/09/2021) 30 tablet 0   lamoTRIgine (LAMICTAL) 25 MG tablet Take 1 tablet (25 mg total) by mouth daily. For mood stabilization (Patient not taking: Reported on 07/09/2021) 30 tablet 0   meloxicam (MOBIC) 7.5 MG tablet Take 1 tablet (7.5 mg total) by mouth daily. (Patient not taking: Reported on  07/09/2021) 30 tablet 0   nicotine polacrilex (NICORETTE) 2 MG gum Take 1 each (2 mg total) by mouth as needed for smoking cessation. (Patient not taking: Reported on 07/09/2021) 100 tablet 0    Results for orders placed or performed during the hospital encounter of 07/09/21 (from the past 48 hour(s))  CBC     Status: Abnormal   Collection Time: 07/18/21  6:01 AM  Result Value Ref Range   WBC 11.5 (H) 4.0 - 10.5 K/uL   RBC 4.10 (L) 4.22 - 5.81 MIL/uL   Hemoglobin 10.7 (L) 13.0 - 17.0 g/dL   HCT 34.8 (L) 39.0 - 52.0 %   MCV 84.9 80.0 - 100.0 fL   MCH 26.1 26.0 - 34.0 pg   MCHC 30.7 30.0 - 36.0 g/dL   RDW 23.0 (H) 11.5 - 15.5 %   Platelets 516 (H) 150  - 400 K/uL   nRBC 0.0 0.0 - 0.2 %    Comment: Performed at [facility], [address], [place], [place] [address]  Basic metabolic panel     Status: Abnormal   Collection Time: 07/18/21  6:01 AM  Result Value Ref Range   Sodium 138 135 - 145 mmol/L   Potassium 4.1 3.5 - 5.1 mmol/L   Chloride 105 98 - 111 mmol/L   CO2 25 22 - 32 mmol/L   Glucose, Bld 95 70 - 99 mg/dL    Comment: Glucose reference range applies only to samples taken after fasting for at least 8 hours.   BUN 12 6 - 20 mg/dL   Creatinine, Ser 0.48 (L) 0.61 - 1.24 mg/dL   Calcium 8.8 (L) 8.9 - 10.3 mg/dL   GFR, Estimated >60 >60 mL/min    Comment: (NOTE) Calculated using the CKD-EPI Creatinine Equation (2021)    Anion gap 8 5 - 15    Comment: Performed at [facility] [address] [place], [place] [address]   No results found.  Review of Systems  Constitutional:  Positive for activity change and appetite change.  Eyes: Negative.   Respiratory: Negative.    Cardiovascular: Negative.   Gastrointestinal: Negative.   Endocrine: Negative.   Genitourinary: Negative.   Musculoskeletal: Negative.   Skin:  Positive for color change and wound.  Neurological:  Positive for numbness.   Blood pressure 124/70, pulse 73, temperature 98 F (36.7 C), temperature source Oral, resp. rate 17, height 5' 7"  (1.702 m), weight 73.4 kg, SpO2 96 %. Physical Exam Vitals and nursing note reviewed.  Constitutional:      Appearance: Normal appearance.  Cardiovascular:     Rate and Rhythm: Normal rate.     Pulses: Normal pulses.  Pulmonary:     Effort: Pulmonary effort is normal.  Abdominal:     Palpations: Abdomen is soft.  Musculoskeletal:        General: Swelling, tenderness, deformity and signs of injury present.  Skin:    General: Skin is warm.     Coloration: Skin is not jaundiced.     Findings: Bruising, erythema and lesion present.  Neurological:     Mental Status: He is alert and oriented to person,  place, and time.  Psychiatric:        Mood and Affect: Mood normal.        Behavior: Behavior normal.        Thought Content: Thought content normal.     Assessment/Plan Plan for debridement of right arm with Acell or myriad placement and  VAC. The risks that can be encountered were discussed and include the following but not limited to these:bleeding, infection, delayed healing, anesthesia risks, skin sensation changes, injury to structures including nerves, blood vessels, and muscles which may be temporary or permanent, allergies to tape, suture materials and glues, blood products, topical preparations or injected agents, skin contour irregularities, skin discoloration and swelling, deep vein thrombosis, cardiac and pulmonary complications, pain, which may persist, persistent pain, recurrence of the lesion, poor healing of the incision, possible need for revisional surgery or staged procedures.   [place], DO 07/18/2021, 3:20 PM

## 2021-07-18 NOTE — Anesthesia Procedure Notes (Signed)
Procedure Name: LMA Insertion Date/Time: 07/18/2021 3:35 PM Performed by: Ayesha Rumpf, CRNA Pre-anesthesia Checklist: Patient identified, Emergency Drugs available, Suction available and Patient being monitored Patient Re-evaluated:Patient Re-evaluated prior to induction Oxygen Delivery Method: Circle System Utilized Preoxygenation: Pre-oxygenation with 100% oxygen Induction Type: IV induction Ventilation: Mask ventilation without difficulty LMA: LMA inserted LMA Size: 4.0 Number of attempts: 1 Airway Equipment and Method: Bite block Placement Confirmation: positive ETCO2 Tube secured with: Tape Dental Injury: Teeth and Oropharynx as per pre-operative assessment

## 2021-07-18 NOTE — Progress Notes (Signed)
Orthopaedics Daily Progress Note   07/18/2021   8:09 AM  Justin Fisher is a 36 y.o. male 2 Days Post-Op s/p IRRIGATION AND DEBRIDEMENT UPPER EXTREMITY (third debridement)  Subjective Denies nausea, vomiting, or fevers. Pain appropriate and expected.  Objective Vitals:   07/17/21 2042 07/18/21 0453  BP: 120/71 119/69  Pulse: 78 77  Resp: 19 20  Temp: 97.9 F (36.6 C) 98.8 F (37.1 C)  SpO2: 98% 97%    Intake/Output Summary (Last 24 hours) at 07/18/2021 0809 Last data filed at 07/18/2021 0456 Gross per 24 hour  Intake 2139.05 ml  Output 2350 ml  Net -210.95 ml    CT R forearm w/ w/o contrast 07/10/2021 did not reveal any surgically treatable abscess in forearm.  Physical Exam RUE: VAC intact holding suction Ongoing improvement in edema forearm and erythema as well as tenderness No palpable fluctuance in forearm +EDC/FDP/APB/DIO SILT M/U/R +Radial and WWP distally  Assessment 36 y.o. male s/p Procedure(s) (LRB): IRRIGATION AND DEBRIDEMENT EXTREMITY AND  VAC CHANGE (N/A)  Plan Mobility: Out of bed as tolerated Elevate right upper extremity higher than heart Continued improvement in forearm edema and erythema IV antibiotics per ID/primary team for RUE cellulitis and nonsurgical humerus osteomyelitis Pain control: Continue to wean/titrate to appropriate oral regimen DVT Prophylaxis: Per primary team Plan for Plastics OR Monday 07/21/2021 Plastics consulted and following RUE: Nonweightbearing, limited mobility due to wound vacs LUE: No restrictions RLE: No restrictions LLE: No restrictions Disposition: Pending further surgical treatment Dressing care:  Maintain wound vacs at 125 mmHg low continuous suction; elevate right upper extremity higher than heart    Ernestina Columbia M.D. Orthopaedic Surgery [facility]

## 2021-07-18 NOTE — Anesthesia Postprocedure Evaluation (Signed)
Anesthesia Post Note  Patient: Justin Fisher  Procedure(s) Performed: IRRIGATION AND DEBRIDEMENT RIGHT ARM WOUND (Right: Arm Upper) WOUND VAC CHANGE (Right: Arm Upper) APPLICATION OF MYRIAD, ACELL (Right: Arm Upper)     Patient location during evaluation: PACU Anesthesia Type: General Level of consciousness: awake and alert, oriented and patient cooperative Pain management: pain level controlled Vital Signs Assessment: post-procedure vital signs reviewed and stable Respiratory status: spontaneous breathing, nonlabored ventilation and respiratory function stable Cardiovascular status: blood pressure returned to baseline and stable Postop Assessment: no apparent nausea or vomiting Anesthetic complications: no   No notable events documented.  Last Vitals:  Vitals:   07/18/21 1640 07/18/21 1713  BP: 123/77 120/69  Pulse: 98 94  Resp: 20 20  Temp:  36.4 C  SpO2: 96% 99%    Last Pain:  Vitals:   07/18/21 1713  TempSrc: Oral  PainSc: [place]

## 2021-07-18 NOTE — Progress Notes (Signed)
Wound vac alarming, completed seal check no leaks detected but wound vac would not maintain 125 suction. PA made aware, no new orders.

## 2021-07-18 NOTE — Op Note (Signed)
DATE OF OPERATION: 07/18/2021  LOCATION: [facility]  PREOPERATIVE DIAGNOSIS: right arm wound  POSTOPERATIVE DIAGNOSIS: Same  PROCEDURE:  Preparation for right arm wound 10 x 20 cm for placement of Acell sheet (7 x 10 cm two sheets),  (10 x 15 cm) and (3 gm powder) Evacuation of 50 cc of hematoma Placement of VAC  SURGEON: Bellarose Burtt [employer], DO  ASSISTANT: Molli Hazard Therapist, art, PA  EBL: 2 cc  CONDITION: Stable  COMPLICATIONS: None  INDICATION: The patient, Justin Fisher, is a 36 y.o. male born on January 13, 1986, is here for treatment of a large right arm wound after severe infection.   PROCEDURE DETAILS:  The patient was seen prior to surgery and marked.  The IV antibiotics were given. The patient was taken to the operating room and given a general anesthetic. A standard time out was performed and all information was confirmed by those in the room. SCDs were placed.   The right arm was prepped and draped.  There was a 50 cc hematoma that was evacuated.  The 10 x 20 cm wound of the right arm was irrigate with saline.  All of the acell powder and sheet was applied and secured with the 5-0 Vicryl.  The sorbact was placed on the acell and secured with the 5-0 Vicryl.  The vac was applied and there was an excellent seal.   The patient was allowed to wake up and taken to recovery room in stable condition at the end of the case. The family was notified at the end of the case.   The advanced practice practitioner (APP) assisted throughout the case.  The APP was essential in retraction and counter traction when needed to make the case progress smoothly.  This retraction and assistance made it possible to see the tissue plans for the procedure.  The assistance was needed for blood control, tissue re-approximation and assisted with closure of the incision site.  Pictures were obtained of the patient and placed in the chart.

## 2021-07-18 NOTE — Transfer of Care (Signed)
Immediate Anesthesia Transfer of Care Note  Patient: Justin Fisher  Procedure(s) Performed: IRRIGATION AND DEBRIDEMENT RIGHT ARM WOUND (Right: Arm Upper) WOUND VAC CHANGE (Right: Arm Upper) APPLICATION OF MYRIAD, ACELL (Right: Arm Upper)  Patient Location: PACU  Anesthesia Type:General  Level of Consciousness: awake, drowsy, patient cooperative and responds to stimulation  Airway & Oxygen Therapy: Patient Spontanous Breathing and Patient connected to nasal cannula oxygen  Post-op Assessment: Report given to RN and Post -op Vital signs reviewed and stable  Post vital signs: Reviewed and stable  Last Vitals:  Vitals Value Taken Time  BP 100/62 07/18/21 1635  Temp    Pulse 100 07/18/21 1635  Resp 10 07/18/21 1635  SpO2 98 % 07/18/21 1635  Vitals shown include unvalidated device data.  Last Pain:  Vitals:   07/18/21 1404  TempSrc:   PainSc: 6       Patients Stated Pain Goal: 2 (82/06/01 5615)  Complications: No notable events documented.

## 2021-07-19 DIAGNOSIS — Z720 Tobacco use: Secondary | ICD-10-CM | POA: Diagnosis not present

## 2021-07-19 DIAGNOSIS — L02413 Cutaneous abscess of right upper limb: Secondary | ICD-10-CM | POA: Diagnosis not present

## 2021-07-19 LAB — CBC WITH DIFFERENTIAL/PLATELET
Abs Immature Granulocytes: 0.1 10*3/uL — ABNORMAL HIGH (ref 0.00–0.07)
Basophils Absolute: 0.1 10*3/uL (ref 0.0–0.1)
Basophils Relative: 1 %
Eosinophils Absolute: 0.1 10*3/uL (ref 0.0–0.5)
Eosinophils Relative: 1 %
HCT: 33.4 % — ABNORMAL LOW (ref 39.0–52.0)
Hemoglobin: 10.3 g/dL — ABNORMAL LOW (ref 13.0–17.0)
Immature Granulocytes: 1 %
Lymphocytes Relative: 43 %
Lymphs Abs: 5.5 10*3/uL — ABNORMAL HIGH (ref 0.7–4.0)
MCH: 26.3 pg (ref 26.0–34.0)
MCHC: 30.8 g/dL (ref 30.0–36.0)
MCV: 85.4 fL (ref 80.0–100.0)
Monocytes Absolute: 0.9 10*3/uL (ref 0.1–1.0)
Monocytes Relative: 7 %
Neutro Abs: 6.1 10*3/uL (ref 1.7–7.7)
Neutrophils Relative %: 47 %
Platelets: 525 10*3/uL — ABNORMAL HIGH (ref 150–400)
RBC: 3.91 MIL/uL — ABNORMAL LOW (ref 4.22–5.81)
RDW: 23.2 % — ABNORMAL HIGH (ref 11.5–15.5)
WBC: 12.9 10*3/uL — ABNORMAL HIGH (ref 4.0–10.5)
nRBC: 0 % (ref 0.0–0.2)

## 2021-07-19 LAB — COMPREHENSIVE METABOLIC PANEL
ALT: 29 U/L (ref 0–44)
AST: 39 U/L (ref 15–41)
Albumin: 2.6 g/dL — ABNORMAL LOW (ref 3.5–5.0)
Alkaline Phosphatase: 70 U/L (ref 38–126)
Anion gap: 9 (ref 5–15)
BUN: 19 mg/dL (ref 6–20)
CO2: 27 mmol/L (ref 22–32)
Calcium: 8.4 mg/dL — ABNORMAL LOW (ref 8.9–10.3)
Chloride: 101 mmol/L (ref 98–111)
Creatinine, Ser: 0.73 mg/dL (ref 0.61–1.24)
GFR, Estimated: >60 mL/min (ref >60)
Glucose, Bld: 128 mg/dL — ABNORMAL HIGH (ref 70–99)
Potassium: 4.2 mmol/L (ref 3.5–5.1)
Sodium: 137 mmol/L (ref 135–145)
Total Bilirubin: 0.3 mg/dL (ref 0.3–1.2)
Total Protein: 6 g/dL — ABNORMAL LOW (ref 6.5–8.1)

## 2021-07-19 MED ORDER — NICOTINE POLACRILEX 2 MG MT GUM
4.0000 mg | CHEWING_GUM | OROMUCOSAL | Status: DC | PRN
  Administered 2021-07-19 – 2021-07-22 (×8): 4 mg via ORAL
  Filled 2021-07-19 (×10): qty 2

## 2021-07-19 NOTE — Progress Notes (Signed)
[facility] PROGRESS NOTE  Crit Obremski OMV:[id number] DOB: 12/26/1985 DOA: 07/09/2021 PCP: Patient, No Pcp Per (Inactive)  Status: Remains inpatient appropriate because:  Prolonged inpatient IV antibiotics.  Due to history of IVDA, not a candidate for home IV therapies via PICC line.  Also ongoing active management by orthopedic/plastic surgery with multiple I&D's of right upper extremity, wound VAC placement and with upcoming plans for further surgical intervention.    Barriers to discharge: Social:Known history of active IV drug abuse prior to admission  Clinical: Continues to require IV antibiotics and regular wound therapy for wound upper extremity.  May require additional surgical procedures including plastic surgery  Level of care:  Med-Surg   Code Status: Full Family Communication: Patient only DVT prophylaxis: SCDs COVID vaccination status: Unknown    HPI: 36 year old M with PMH of IVDU (heroin, fentanyl and meth), anxiety, HLD, suicidal ideation and tobacco use disorder presenting to ED on 07/09/2021 with progressive severe right upper extremity pain, swelling, oozing brown and foul-smelling liquid after injecting IV drugs using a needle with blood in it, and admitted for sepsis due to right upper arm cellulitis/neck fasciitis/abscess/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 orthopedic surgery and ID consulted.  Initially started on vancomycin, Zosyn and clindamycin.  He underwent I&D with VAC placement on 1/5 and 1/8.  Blood cultures NGTD.  Surgical culture with group F Streptococcus.  Antibiotics de-escalated to IV Zosyn.  ID, orthopedic surgery and plastic surgery following.   Hospital course complicated by acute blood loss anemia related to surgery.  He had about 2 L blood transfused from 1/8-1/9.  Subjective: Overall states that his pain and anxiety is better since starting methadone.  He wishes that the methadone can be rapidly uptitrated to prior dose of "110  mg daily".  Asking if he can get both nicotine patch and gum for tobacco craving.  Asking if his Nicorette gum dose can be increased.  Wishes he could go out and smoke.  Informs me that he may have another procedure on 1/16 with plastic surgery,?  Skin graft.  Objective: Vitals:   07/19/21 0752 07/19/21 1500  BP: 111/60 114/60  Pulse: 97 100  Resp: 18 18  Temp: 98 F (36.7 C) 97.8 F (36.6 C)  SpO2: 97% 98%    Intake/Output Summary (Last 24 hours) at 07/19/2021 1509 Last data filed at 07/19/2021 0900 Gross per 24 hour  Intake 800 ml  Output 1250 ml  Net -450 ml   Filed Weights   07/10/21 2308  Weight: 73.4 kg    Exam:  Constitutional: Young male, moderately built and nourished lying comfortably propped up in bed without distress.  Looks improved compared to 2 to 3 days ago. Respiratory: Clear to auscultation.  No increased work of breathing. Cardiovascular: S1 and S2 heard, RRR.  No JVD, murmurs or pedal edema. Abdomen: no tenderness, no masses palpated. Bowel sounds positive. LBM 1/12 Skin: no rashes, lesions, ulcers. No induration.   Neurologic: CN 2-12 grossly intact. Sensation intact, Strength 5/5 x all 4 extremities.  Psychiatric: Normal judgment and insight. Alert and oriented x 3.  Anxious mood. Extremities: Right upper extremity swelling has improved compared to 48-72 hours ago.  Has right lateral upper arm wound VAC.   Assessment/Plan: Acute problems: Right upper arm cellulitis/neck fasciitis/acute osteomyelitis and patient with history of IVDU -Superficial wound culture positive for moderate strep group F with intraoperative surgical culture from 1/5 with moderate strep group F; blood cultures negative -CRP 1.6.  ESR 20. -S/p  I&D on 1/5 and 1/8 with wound VAC placement -Orthopedic surgery, infectious disease and plastic surgery following. -IV Vanco, Zosyn and clinda>> IV Zosyn>> IV Unasyn until 07/26/2021 then p.o. Augmentin until 08/23/2021.  Recommend cussing with  ID closer to 1/21 regarding any updated advice regarding antibiotics. -Since methadone is being initiated adjustments were made to pain medications as follows: We will continue IV Dilaudid for severe breakthrough pain.  Have discontinued higher dose of Oxy IR that was added for severe pain and replace this with a 10 mg dose every 4 hours for moderate breakthrough pain -S/p I&D and wound VAC change on 1/11. -1/13: Right arm ACell placement, evacuation of hematoma and placement of VAC by plastic surgery   Polysubstance use/IVDU including heroin, cocaine, fentanyl, meth and tobacco -UDS positive for cocaine on admission.  HIV nonreactive.  Hepatitis C antibody positive. -Encourage cessation. -Continue Nicorette gum.  He is not on a nicotine patch. -Hep B vaccine -Outpatient follow-up with [facility] upon discharge. -1/12 initiated methadone 30 mg daily based on recommendations from [facility] clinic (Spoke w/ J[place] Ralph) and [place].  Can go up to 5 mg every 72 hours if necessary to achieve therapeutic effect   Acute blood loss anemia superimposed on chronic iron deficiency anemia -Hgb 15.5 in 2018.   -Patient was given 1 unit of PRBCs on 1/5 and a total of 4 units of PRBCs on 1/8 -Hemoglobin has been stable in the 10 g range since 1/9   Uncontrolled hypertension:  Not on medication at home. -Continue hydralazine as needed.  Controlled.   Hyponatremia:  Resolved   Hypokalemia:  Resolved   History of anxiety/depression:  Stable. -Primarily on Vistaril at home with current dosage ineffective therefore will increase frequency to every 6 hours as needed -Discussed with [facility] and per my thoughts also stated would not utilize benzodiazepines but felt that resumption of methadone would be quite helpful in improving patient's level of anxiety   Leukocytosis/bandemia/thrombocytosis:  Likely reactive due to #1.  Improved. -Continue monitoring   Hepatitis C antibody positive -Hep C  quantitative: 9, 400, 000 IU/mL.  6.973 Log 10 IU/mL. Outpatient follow-up with ID.      Data Reviewed: Basic Metabolic Panel: Recent Labs  Lab 07/13/21 0325 07/14/21 0323 07/15/21 0245 07/18/21 0601 07/19/21 0038  NA 137 141 140 138 137  K 3.7 3.5 3.6 4.1 4.2  CL 107 106 107 105 101  CO2 _0 GLUCOSE 126* 117* 114* 95 128*  BUN _1 CREATININE 0.56* 0.57* 0.53* 0.48* 0.73  CALCIUM 7.9* 7.9* 8.1* 8.8* 8.4*  MG  --   --  1.8  --   --   PHOS  --   --  3.6  --   --    Liver Function Tests: Recent Labs  Lab 07/15/21 0245 07/19/21 0038  AST  --  39  ALT  --  29  ALKPHOS  --  70  BILITOT  --  0.3  PROT  --  6.0*  ALBUMIN 2.2* 2.6*    CBC: Recent Labs  Lab 07/13/21 0325 07/14/21 0323 07/15/21 0245 07/18/21 0601 07/19/21 0038  WBC 16.0* 16.8* 14.3* 11.5* 12.9*  NEUTROABS 7.4  --   --   --  6.1  HGB 6.3* 10.4* 10.7* 10.7* 10.3*  HCT 20.4* 32.9* 33.1* 34.8* 33.4*  MCV 76.1* 79.7* 80.9 84.9 85.4  PLT 474* 530* 520* 516* 525*   Cardiac Enzymes: Recent Labs  Lab 07/15/21 0245  CKTOTAL 23*    Recent Results (from the past 240 hour(s))  Aerobic Culture w Gram Stain (superficial specimen)     Status: None   Collection Time: 07/09/21  6:22 PM   Specimen: Abscess  Result Value Ref Range Status   Specimen Description ABSCESS  Final   Special Requests Normal  Final   Gram Stain   Final    ABUNDANT WBC PRESENT,BOTH PMN AND MONONUCLEAR MODERATE GRAM VARIABLE ROD MODERATE GRAM POSITIVE COCCI    Culture   Final    MODERATE STREPTOCOCCUS GROUP F Beta hemolytic streptococci are predictably susceptible to penicillin and other beta lactams. Susceptibility testing not routinely performed. Performed at [facility], [place] [address] [place], [place] [address]    Report Status 07/11/2021 FINAL  Final  Resp Panel by RT-PCR (Flu A&B, Covid)     Status: None   Collection Time: 07/09/21  6:33 PM   Specimen: Nasopharyngeal(NP) swabs in vial  transport medium  Result Value Ref Range Status   SARS Coronavirus 2 by RT PCR NEGATIVE NEGATIVE Final    Comment: (NOTE) SARS-CoV-2 target nucleic acids are NOT DETECTED.  The SARS-CoV-2 RNA is generally detectable in upper respiratory specimens during the acute phase of infection. The lowest concentration of SARS-CoV-2 viral copies this assay can detect is 138 copies/mL. A negative result does not preclude SARS-Cov-2 infection and should not be used as the sole basis for treatment or other patient management decisions. A negative result may occur with  improper specimen collection/handling, submission of specimen other than nasopharyngeal swab, presence of viral mutation(s) within the areas targeted by this assay, and inadequate number of viral copies(<138 copies/mL). A negative result must be combined with clinical observations, patient history, and epidemiological information. The expected result is Negative.  Fact Sheet for Patients:  EntrepreneurPulse.com.au  Fact Sheet for Healthcare Providers:  IncredibleEmployment.be  This test is no t yet approved or cleared by the [place] FDA and  has been authorized for detection and/or diagnosis of SARS-CoV-2 by FDA under an Emergency Use Authorization (EUA). This EUA will remain  in effect (meaning this test can be used) for the duration of the COVID-19 declaration under Section 564(b)(1) of the Act, 21 U.S.C.section 360bbb-3(b)(1), unless the authorization is terminated  or revoked sooner.       Influenza A by PCR NEGATIVE NEGATIVE Final   Influenza B by PCR NEGATIVE NEGATIVE Final    Comment: (NOTE) The Xpert Xpress SARS-CoV-2/FLU/RSV plus assay is intended as an aid in the diagnosis of influenza from Nasopharyngeal swab specimens and should not be used as a sole basis for treatment. Nasal washings and aspirates are unacceptable for Xpert Xpress SARS-CoV-2/FLU/RSV testing.  Fact  Sheet for Patients: EntrepreneurPulse.com.au  Fact Sheet for Healthcare Providers: IncredibleEmployment.be  This test is not yet approved or cleared by the [place] FDA and has been authorized for detection and/or diagnosis of SARS-CoV-2 by FDA under an Emergency Use Authorization (EUA). This EUA will remain in effect (meaning this test can be used) for the duration of the COVID-19 declaration under Section 564(b)(1) of the Act, 21 U.S.C. section 360bbb-3(b)(1), unless the authorization is terminated or revoked.  Performed at [facility], [place] [address], [place], [place] [address]   Blood culture (routine x 2)     Status: None   Collection Time: 07/09/21  7:00 PM   Specimen: BLOOD  Result Value Ref Range Status   Specimen Description BLOOD LEFT ANTECUBITAL  Final   Special Requests   Final    BOTTLES DRAWN AEROBIC AND ANAEROBIC Blood Culture adequate volume   Culture   Final    NO GROWTH 5 DAYS Performed at [facility], [address] [place], [place] [address]    Report Status 07/14/2021 FINAL  Final  Surgical pcr screen     Status: Abnormal   Collection Time: 07/10/21  2:09 PM   Specimen: Nasal Mucosa; Nasal Swab  Result Value Ref Range Status   MRSA, PCR NEGATIVE NEGATIVE Final   Staphylococcus aureus POSITIVE (A) NEGATIVE Final    Comment: (NOTE) The Xpert SA Assay (FDA approved for NASAL specimens in patients 85 years of age and older), is one component of a comprehensive surveillance program. It is not intended to diagnose infection nor to guide or monitor treatment. Performed at [facility], [place] [address], [place], [place] [address]   Fungus Culture With Stain     Status: None (Preliminary result)   Collection Time: 07/10/21  5:26 PM   Specimen: Arm, Right; Tissue  Result Value Ref Range Status   Fungus Stain Final report  Final    Comment: (NOTE) Performed At: [facility] [place], [place] [id number] Rush Farmer MD [id number]    Fungus (Mycology) Culture PENDING  Incomplete   Fungal Source WOUND  Final    Comment: RIGHT ARM Performed at [facility], [place] [address], [place], [place] [address]   Aerobic/Anaerobic Culture w Gram Stain (surgical/deep wound)     Status: None   Collection Time: 07/10/21  5:26 PM   Specimen: Arm, Right; Tissue  Result Value Ref Range Status   Specimen Description WOUND RIGHT ARM  Final   Special Requests PT ON ANCEF,CLINDAMYCIN,TXA,VANCOMYCIN,ZOSYN  Final   Gram Stain   Final    MODERATE WBC PRESENT,BOTH PMN AND MONONUCLEAR ABUNDANT GRAM NEGATIVE RODS ABUNDANT GRAM POSITIVE RODS RARE GRAM POSITIVE COCCI IN PAIRS Performed at [facility], [place] [address], [place], [place] [address]    Culture   Final    MODERATE STREPTOCOCCUS GROUP F Beta hemolytic streptococci are predictably susceptible to penicillin and other beta lactams. Susceptibility testing not routinely performed. MIXED ANAEROBIC FLORA PRESENT.  CALL LAB IF FURTHER IID REQUIRED.    Report Status 07/15/2021 FINAL  Final  Fungus Culture Result     Status: None   Collection Time: 07/10/21  5:26 PM  Result Value Ref Range Status   Result 1 Comment  Final    Comment: (NOTE) KOH/Calcofluor preparation:  no fungus observed. Performed At: [facility] [place], [place] [id number] Rush Farmer MD [id number]   Aerobic/Anaerobic Culture w Gram Stain (surgical/deep wound)     Status: None   Collection Time: 07/10/21  5:29 PM   Specimen: Arm, Right; Tissue  Result Value Ref Range Status   Specimen Description TISSUE RIGHT ARM  Final   Special Requests POSTERIOR PT ON ANCEF,CLINDAMYCIN,TXA,VANC,ZOSYN  Final   Gram Stain   Final    ABUNDANT WBC PRESENT,BOTH PMN AND MONONUCLEAR ABUNDANT GRAM NEGATIVE RODS ABUNDANT GRAM POSITIVE RODS FEW GRAM POSITIVE COCCI IN PAIRS Performed at [facility], [place] [address]  [place], [place] [address]    Culture   Final    ABUNDANT STREPTOCOCCUS GROUP F Beta hemolytic streptococci are predictably susceptible to penicillin and other beta lactams. Susceptibility testing not routinely performed. RARE STAPHYLOCOCCUS EPIDERMIDIS MIXED ANAEROBIC FLORA PRESENT.  CALL LAB IF FURTHER  IID REQUIRED.    Report Status 07/15/2021 FINAL  Final   Organism ID, Bacteria STAPHYLOCOCCUS EPIDERMIDIS  Final      Susceptibility   Staphylococcus epidermidis - MIC*    CIPROFLOXACIN <=0.5 SENSITIVE Sensitive     ERYTHROMYCIN >=8 RESISTANT Resistant     GENTAMICIN <=0.5 SENSITIVE Sensitive     OXACILLIN <=0.25 SENSITIVE Sensitive     TETRACYCLINE <=1 SENSITIVE Sensitive     VANCOMYCIN 1 SENSITIVE Sensitive     TRIMETH/SULFA 160 RESISTANT Resistant     CLINDAMYCIN <=0.25 SENSITIVE Sensitive     RIFAMPIN <=0.5 SENSITIVE Sensitive     Inducible Clindamycin NEGATIVE Sensitive     * RARE STAPHYLOCOCCUS EPIDERMIDIS  Fungus Culture With Stain     Status: None (Preliminary result)   Collection Time: 07/10/21  5:35 PM   Specimen: PATH Other; Wound  Result Value Ref Range Status   Fungus Stain Final report  Final    Comment: (NOTE) Performed At: [facility] [place], [place] [id number] Rush Farmer MD [id number]    Fungus (Mycology) Culture PENDING  Incomplete   Fungal Source TISSUE  Final    Comment: RIGHT ARM LATERAL Performed at [facility], [place] [address], [place], [place] [address]   Aerobic/Anaerobic Culture w Gram Stain (surgical/deep wound)     Status: None   Collection Time: 07/10/21  5:35 PM   Specimen: PATH Other; Wound  Result Value Ref Range Status   Specimen Description TISSUE RIGHT ARM  Final   Special Requests LATERAL SAMPLE B  Final   Gram Stain   Final    RARE WBC PRESENT, PREDOMINANTLY MONONUCLEAR ABUNDANT GRAM NEGATIVE RODS ABUNDANT GRAM POSITIVE RODS RARE GRAM POSITIVE COCCI IN PAIRS Performed at [facility],  [place] [address], [place],  [address]    Culture   Final    ABUNDANT STREPTOCOCCUS GROUP F Beta hemolytic streptococci are predictably susceptible to penicillin and other beta lactams. Susceptibility testing not routinely performed. RARE STAPHYLOCOCCUS HOMINIS MIXED ANAEROBIC FLORA PRESENT.  CALL LAB IF FURTHER IID REQUIRED.    Report Status 07/15/2021 FINAL  Final   Organism ID, Bacteria STAPHYLOCOCCUS HOMINIS  Final      Susceptibility   Staphylococcus hominis - MIC*    CIPROFLOXACIN <=0.5 SENSITIVE Sensitive     ERYTHROMYCIN >=8 RESISTANT Resistant     GENTAMICIN <=0.5 SENSITIVE Sensitive     OXACILLIN <=0.25 SENSITIVE Sensitive     TETRACYCLINE <=1 SENSITIVE Sensitive     VANCOMYCIN <=0.5 SENSITIVE Sensitive     TRIMETH/SULFA <=10 SENSITIVE Sensitive     CLINDAMYCIN <=0.25 SENSITIVE Sensitive     RIFAMPIN <=0.5 SENSITIVE Sensitive     Inducible Clindamycin NEGATIVE Sensitive     * RARE STAPHYLOCOCCUS HOMINIS  Fungus Culture Result     Status: None   Collection Time: 07/10/21  5:35 PM  Result Value Ref Range Status   Result 1 Comment  Final    Comment: (NOTE) KOH/Calcofluor preparation:  no fungus observed. Performed At: [facility] [place], [place] [id number] Rush Farmer MD [id number]        Scheduled Meds:  docusate sodium  100 mg Oral BID   feeding supplement  237 mL Oral BID BM   gabapentin  400 mg Oral TID   methadone  30 mg Oral Daily   mirtazapine  30 mg Oral QHS   multivitamin with minerals  1 tablet Oral Daily   sertraline  200 mg Oral Daily  traZODone  100 mg Oral QHS   Continuous Infusions:  ampicillin-sulbactam (UNASYN) IV 3 g (07/19/21 1021)    Principal Problem:   Sepsis ([place]) Active Problems:   Heroin use disorder, moderate, dependence (HCC)   Anemia   Cellulitis   Hypokalemia   Hyponatremia   Tobacco abuse   Protein-calorie malnutrition, severe   Malnutrition of moderate degree   Abscess of right  arm   Consultants: Orthopedic team Plastic surgery Infectious disease  Procedures: Intraoperative IND with wound VAC placement of RUE on 1/5 and 1/8 Echocardiogram  Antibiotics: Zosyn 1/4 through 1/9 Vancomycin 1/4 through 1/7 Cefazolin x1 dose 1/5 Clindamycin 1/5 through 1/7 Unasyn 1/9 >>   Time spent: 35 minutes    Vernell Leep, MD,  FACP, [facility], [facility], [facility] (Care Management Physician Certified) [place]  To contact the attending provider between 7A-7P or the covering provider during after hours 7P-7A, please log into the web site [email or web address] and access using universal [place] password for that web site. If you do not have the password, please call the hospital operator.  10  days

## 2021-07-19 NOTE — Plan of Care (Signed)
°  Problem: Nutritional: Goal: Nutritional status of the infant will improve as evidenced by minimal weight loss and appropriate weight gain for gestational age Outcome: Progressing Goal: Ability to maintain a balanced intake and output will improve Outcome: Progressing   

## 2021-07-20 ENCOUNTER — Encounter (HOSPITAL_COMMUNITY): Payer: Self-pay | Admitting: Plastic Surgery

## 2021-07-20 DIAGNOSIS — L02413 Cutaneous abscess of right upper limb: Secondary | ICD-10-CM | POA: Diagnosis not present

## 2021-07-20 MED ORDER — METHADONE HCL 10 MG PO TABS
35.0000 mg | ORAL_TABLET | Freq: Every day | ORAL | Status: DC
  Administered 2021-07-21 – 2021-07-22 (×2): 35 mg via ORAL
  Filled 2021-07-20 (×2): qty 4

## 2021-07-20 MED ORDER — METHADONE HCL 10 MG PO TABS
5.0000 mg | ORAL_TABLET | Freq: Once | ORAL | Status: AC
  Administered 2021-07-20: 5 mg via ORAL
  Filled 2021-07-20: qty 1

## 2021-07-20 NOTE — Plan of Care (Signed)
  Problem: Nutritional: Goal: Nutritional status of the infant will improve as evidenced by minimal weight loss and appropriate weight gain for gestational age Outcome: Progressing Goal: Ability to maintain a balanced intake and output will improve Outcome: Progressing   Problem: Clinical Measurements: Goal: Ability to maintain clinical measurements within normal limits will improve Outcome: Progressing   Problem: Skin Integrity: Goal: Risk for impaired skin integrity will decrease Outcome: Progressing Goal: Demonstrates signs of wound healing without infection Outcome: Progressing   

## 2021-07-20 NOTE — Progress Notes (Signed)
[facility] PROGRESS NOTE  Demonie Kassa MWN:[id number] DOB: 1986-03-08 DOA: 07/09/2021 PCP: Patient, No Pcp Per (Inactive)  Status: Remains inpatient appropriate because:  Prolonged inpatient IV antibiotics.  Due to history of IVDA, not a candidate for home IV therapies via PICC line.  Also ongoing active management by orthopedic/plastic surgery with multiple I&D's of right upper extremity, wound VAC placement and with upcoming plans for further surgical intervention.    Barriers to discharge: Social:Known history of active IV drug abuse prior to admission  Clinical: Continues to require IV antibiotics and regular wound therapy for wound upper extremity.  May require additional surgical procedures including plastic surgery  Level of care:  Med-Surg   Code Status: Full Family Communication: Patient only DVT prophylaxis: SCDs COVID vaccination status: Unknown    HPI: 36 year old M with PMH of IVDU (heroin, fentanyl and meth), anxiety, HLD, suicidal ideation and tobacco use disorder presenting to ED on 07/09/2021 with progressive severe right upper extremity pain, swelling, oozing brown and foul-smelling liquid after injecting IV drugs using a needle with blood in it, and admitted for sepsis due to right upper arm cellulitis/neck fasciitis/abscess/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 orthopedic surgery and ID consulted.  Initially started on vancomycin, Zosyn and clindamycin.  He underwent I&D with VAC placement on 1/5 and 1/8.  Blood cultures NGTD.  Surgical culture with group F Streptococcus.  Antibiotics de-escalated to IV Zosyn.  ID, orthopedic surgery and plastic surgery following.   Hospital course complicated by acute blood loss anemia related to surgery.  He had about 2 L blood transfused from 1/8-1/9.  Subjective: No new complaints.  Feels that his right upper extremity continues to steadily improve.  Swelling has resolved.  Feels some weakness but has been advised  by surgeons that it will likely be weak for some time.  Objective: Vitals:   07/20/21 0756 07/20/21 1148  BP: (!) 123/58 134/76  Pulse: 99 95  Resp: 18 18  Temp: 98 F (36.7 C) 99.6 F (37.6 C)  SpO2: 98% 98%    Intake/Output Summary (Last 24 hours) at 07/20/2021 1354 Last data filed at 07/20/2021 0535 Gross per 24 hour  Intake 167.36 ml  Output 1600 ml  Net -1432.64 ml   Filed Weights   07/10/21 2308  Weight: 73.4 kg    Exam: No new findings today. Constitutional: Young male, moderately built and nourished lying comfortably propped up in bed without distress.  Looks improved compared to 2 to 3 days ago. Respiratory: Clear to auscultation.  No increased work of breathing. Cardiovascular: S1 and S2 heard, RRR.  No JVD, murmurs or pedal edema. Abdomen: no tenderness, no masses palpated. Bowel sounds positive. LBM 1/12 Skin: no rashes, lesions, ulcers. No induration.   Neurologic: CN 2-12 grossly intact. Sensation intact, Strength 5/5 x all 4 extremities.  Psychiatric: Normal judgment and insight. Alert and oriented x 3.  Anxious mood. Extremities: Right upper extremity swelling has improved compared to 48-72 hours ago.  Has right lateral upper arm wound VAC.   Assessment/Plan: Acute problems: Right upper arm cellulitis/neck fasciitis/acute osteomyelitis and patient with history of IVDU -Superficial wound culture positive for moderate strep group F with intraoperative surgical culture from 1/5 with moderate strep group F; blood cultures negative -CRP 1.6.  ESR 20. -S/p I&D on 1/5 and 1/8 with wound VAC placement -Orthopedic surgery, infectious disease and plastic surgery following. -IV Vanco, Zosyn and clinda>> IV Zosyn>> IV Unasyn until 07/26/2021 then p.o. Augmentin until 08/23/2021.  Recommend discussing with  ID closer to 1/21 regarding any updated advice regarding antibiotics. -Since methadone is being initiated adjustments were made to pain medications as follows: We will  continue IV Dilaudid for severe breakthrough pain.  Have discontinued higher dose of Oxy IR that was added for severe pain and replace this with a 10 mg dose every 4 hours for moderate breakthrough pain -S/p I&D and wound VAC change on 1/11. -1/13: Right arm ACell placement, evacuation of hematoma and placement of VAC by plastic surgery   Polysubstance use/IVDU including heroin, cocaine, fentanyl, meth and tobacco -UDS positive for cocaine on admission.  HIV nonreactive.  Hepatitis C antibody positive. -Encourage cessation. -Continue Nicorette gum, dose increased from 2 to 4 mg.  He is not on a nicotine patch. -Hep B vaccine -Outpatient follow-up with [facility] upon discharge. -1/12 initiated methadone 30 mg daily based on recommendations from [facility] clinic (Spoke w/ Jerral Ralph) and [place].  Can go up to 5 mg every 72 hours if necessary to achieve therapeutic effect.  Methadone increased to 35 mg daily on 1/15   Acute blood loss anemia superimposed on chronic iron deficiency anemia -Hgb 15.5 in 2018.   -Patient was given 1 unit of PRBCs on 1/5 and a total of 4 units of PRBCs on 1/8 -Hemoglobin has been stable in the 10 g range since 1/9   Uncontrolled hypertension:  Not on medication at home. -Continue hydralazine as needed.  Controlled.   Hyponatremia:  Resolved   Hypokalemia:  Resolved   History of anxiety/depression:  Stable. -Primarily on Vistaril at home with current dosage ineffective therefore will increase frequency to every 6 hours as needed -Discussed with [facility] and per my thoughts also stated would not utilize benzodiazepines but felt that resumption of methadone would be quite helpful in improving patient's level of anxiety   Leukocytosis/bandemia/thrombocytosis:  Likely reactive due to #1.  Improved. -Continue monitoring   Hepatitis C antibody positive -Hep C quantitative: 9, 400, 000 IU/mL.  6.973 Log 10 IU/mL. Outpatient follow-up with ID.      Data  Reviewed: Basic Metabolic Panel: Recent Labs  Lab 07/14/21 0323 07/15/21 0245 07/18/21 0601 07/19/21 0038  NA 141 140 138 137  K 3.5 3.6 4.1 4.2  CL 106 107 105 101  CO2 25 23 25 27   GLUCOSE 117* 114* 95 128*  BUN 15 10 12 19   CREATININE 0.57* 0.53* 0.48* 0.73  CALCIUM 7.9* 8.1* 8.8* 8.4*  MG  --  1.8  --   --   PHOS  --  3.6  --   --    Liver Function Tests: Recent Labs  Lab 07/15/21 0245 07/19/21 0038  AST  --  39  ALT  --  29  ALKPHOS  --  70  BILITOT  --  0.3  PROT  --  6.0*  ALBUMIN 2.2* 2.6*    CBC: Recent Labs  Lab 07/14/21 0323 07/15/21 0245 07/18/21 0601 07/19/21 0038  WBC 16.8* 14.3* 11.5* 12.9*  NEUTROABS  --   --   --  6.1  HGB 10.4* 10.7* 10.7* 10.3*  HCT 32.9* 33.1* 34.8* 33.4*  MCV 79.7* 80.9 84.9 85.4  PLT 530* 520* 516* 525*   Cardiac Enzymes: Recent Labs  Lab 07/15/21 0245  CKTOTAL 23*    Recent Results (from the past 240 hour(s))  Surgical pcr screen     Status: Abnormal   Collection Time: 07/10/21  2:09 PM   Specimen: Nasal Mucosa; Nasal Swab  Result Value Ref  Range Status   MRSA, PCR NEGATIVE NEGATIVE Final   Staphylococcus aureus POSITIVE (A) NEGATIVE Final    Comment: (NOTE) The Xpert SA Assay (FDA approved for NASAL specimens in patients 43 years of age and older), is one component of a comprehensive surveillance program. It is not intended to diagnose infection nor to guide or monitor treatment. Performed at [facility], [place] [address] [place], [place] [address]   Fungus Culture With Stain     Status: None (Preliminary result)   Collection Time: 07/10/21  5:26 PM   Specimen: Arm, Right; Tissue  Result Value Ref Range Status   Fungus Stain Final report  Final    Comment: (NOTE) Performed At: [facility] [place], [place] [id number] Rush Farmer MD PO:[id number]    Fungus (Mycology) Culture PENDING  Incomplete   Fungal Source WOUND  Final    Comment: RIGHT ARM Performed at [facility], [place] [address] [place], [place] [address]   Aerobic/Anaerobic Culture w Gram Stain (surgical/deep wound)     Status: None   Collection Time: 07/10/21  5:26 PM   Specimen: Arm, Right; Tissue  Result Value Ref Range Status   Specimen Description WOUND RIGHT ARM  Final   Special Requests PT ON ANCEF,CLINDAMYCIN,TXA,VANCOMYCIN,ZOSYN  Final   Gram Stain   Final    MODERATE WBC PRESENT,BOTH PMN AND MONONUCLEAR ABUNDANT GRAM NEGATIVE RODS ABUNDANT GRAM POSITIVE RODS RARE GRAM POSITIVE COCCI IN PAIRS Performed at [facility], [place] [address] [place], [place] [address]    Culture   Final    MODERATE STREPTOCOCCUS GROUP F Beta hemolytic streptococci are predictably susceptible to penicillin and other beta lactams. Susceptibility testing not routinely performed. MIXED ANAEROBIC FLORA PRESENT.  CALL LAB IF FURTHER IID REQUIRED.    Report Status 07/15/2021 FINAL  Final  Fungus Culture Result     Status: None   Collection Time: 07/10/21  5:26 PM  Result Value Ref Range Status   Result 1 Comment  Final    Comment: (NOTE) KOH/Calcofluor preparation:  no fungus observed. Performed At: [facility] [place], [place] [id number] Rush Farmer MD TI:[id number]   Aerobic/Anaerobic Culture w Gram Stain (surgical/deep wound)     Status: None   Collection Time: 07/10/21  5:29 PM   Specimen: Arm, Right; Tissue  Result Value Ref Range Status   Specimen Description TISSUE RIGHT ARM  Final   Special Requests POSTERIOR PT ON ANCEF,CLINDAMYCIN,TXA,VANC,ZOSYN  Final   Gram Stain   Final    ABUNDANT WBC PRESENT,BOTH PMN AND MONONUCLEAR ABUNDANT GRAM NEGATIVE RODS ABUNDANT GRAM POSITIVE RODS FEW GRAM POSITIVE COCCI IN PAIRS Performed at [facility], [place] [address], [place], [place] [address]    Culture   Final    ABUNDANT STREPTOCOCCUS GROUP F Beta hemolytic streptococci are predictably susceptible to penicillin and other beta lactams. Susceptibility  testing not routinely performed. RARE STAPHYLOCOCCUS EPIDERMIDIS MIXED ANAEROBIC FLORA PRESENT.  CALL LAB IF FURTHER IID REQUIRED.    Report Status 07/15/2021 FINAL  Final   Organism ID, Bacteria STAPHYLOCOCCUS EPIDERMIDIS  Final      Susceptibility   Staphylococcus epidermidis - MIC*    CIPROFLOXACIN <=0.5 SENSITIVE Sensitive     ERYTHROMYCIN >=8 RESISTANT Resistant     GENTAMICIN <=0.5 SENSITIVE Sensitive     OXACILLIN <=0.25 SENSITIVE Sensitive     TETRACYCLINE <=1 SENSITIVE Sensitive     VANCOMYCIN 1 SENSITIVE Sensitive     TRIMETH/SULFA  160 RESISTANT Resistant     CLINDAMYCIN <=0.25 SENSITIVE Sensitive     RIFAMPIN <=0.5 SENSITIVE Sensitive     Inducible Clindamycin NEGATIVE Sensitive     * RARE STAPHYLOCOCCUS EPIDERMIDIS  Fungus Culture With Stain     Status: None (Preliminary result)   Collection Time: 07/10/21  5:35 PM   Specimen: PATH Other; Wound  Result Value Ref Range Status   Fungus Stain Final report  Final    Comment: (NOTE) Performed At: [facility] [place], [place] [id number] Rush Farmer MD RX:[id number]    Fungus (Mycology) Culture PENDING  Incomplete   Fungal Source TISSUE  Final    Comment: RIGHT ARM LATERAL Performed at [facility], [place] [address] [place], [place] [address]   Aerobic/Anaerobic Culture w Gram Stain (surgical/deep wound)     Status: None   Collection Time: 07/10/21  5:35 PM   Specimen: PATH Other; Wound  Result Value Ref Range Status   Specimen Description TISSUE RIGHT ARM  Final   Special Requests LATERAL SAMPLE B  Final   Gram Stain   Final    RARE WBC PRESENT, PREDOMINANTLY MONONUCLEAR ABUNDANT GRAM NEGATIVE RODS ABUNDANT GRAM POSITIVE RODS RARE GRAM POSITIVE COCCI IN PAIRS Performed at [facility], [place] [address] [place], [place] [address]    Culture   Final    ABUNDANT STREPTOCOCCUS GROUP F Beta hemolytic streptococci are predictably susceptible to penicillin and other beta lactams.  Susceptibility testing not routinely performed. RARE STAPHYLOCOCCUS HOMINIS MIXED ANAEROBIC FLORA PRESENT.  CALL LAB IF FURTHER IID REQUIRED.    Report Status 07/15/2021 FINAL  Final   Organism ID, Bacteria STAPHYLOCOCCUS HOMINIS  Final      Susceptibility   Staphylococcus hominis - MIC*    CIPROFLOXACIN <=0.5 SENSITIVE Sensitive     ERYTHROMYCIN >=8 RESISTANT Resistant     GENTAMICIN <=0.5 SENSITIVE Sensitive     OXACILLIN <=0.25 SENSITIVE Sensitive     TETRACYCLINE <=1 SENSITIVE Sensitive     VANCOMYCIN <=0.5 SENSITIVE Sensitive     TRIMETH/SULFA <=10 SENSITIVE Sensitive     CLINDAMYCIN <=0.25 SENSITIVE Sensitive     RIFAMPIN <=0.5 SENSITIVE Sensitive     Inducible Clindamycin NEGATIVE Sensitive     * RARE STAPHYLOCOCCUS HOMINIS  Fungus Culture Result     Status: None   Collection Time: 07/10/21  5:35 PM  Result Value Ref Range Status   Result 1 Comment  Final    Comment: (NOTE) KOH/Calcofluor preparation:  no fungus observed. Performed At: [facility] [place], [place] [id number] Rush Farmer MD SN:[id number]        Scheduled Meds:  docusate sodium  100 mg Oral BID   feeding supplement  237 mL Oral BID BM   gabapentin  400 mg Oral TID   [START ON 07/21/2021] methadone  35 mg Oral Daily   mirtazapine  30 mg Oral QHS   multivitamin with minerals  1 tablet Oral Daily   sertraline  200 mg Oral Daily   traZODone  100 mg Oral QHS   Continuous Infusions:  ampicillin-sulbactam (UNASYN) IV 3 g (07/20/21 1327)    Principal Problem:   Sepsis ([place]) Active Problems:   Heroin use disorder, moderate, dependence (HCC)   Anemia   Cellulitis   Hypokalemia   Hyponatremia   Tobacco abuse   Protein-calorie malnutrition, severe   Malnutrition of moderate degree   Abscess of right arm   Consultants: Orthopedic team Plastic surgery Infectious disease  Procedures: Intraoperative IND with wound VAC placement of RUE on 1/5 and  1/8 Echocardiogram  Antibiotics: Zosyn 1/4 through 1/9 Vancomycin 1/4 through 1/7 Cefazolin x1 dose 1/5 Clindamycin 1/5 through 1/7 Unasyn 1/9 >>   Time spent: 35 minutes    Vernell Leep, MD,  FACP, [facility], [facility], [facility] (Care Management Physician Certified) [place]  To contact the attending provider between 7A-7P or the covering provider during after hours 7P-7A, please log into the web site [email or web address] and access using universal [place] password for that web site. If you do not have the password, please call the hospital operator.  11  days

## 2021-07-21 ENCOUNTER — Encounter (HOSPITAL_COMMUNITY)
Admission: EM | Disposition: A | Discharge status: Home / Self Care | Payer: Self-pay | Source: Home / Self Care | Attending: Internal Medicine

## 2021-07-21 DIAGNOSIS — L02413 Cutaneous abscess of right upper limb: Secondary | ICD-10-CM | POA: Diagnosis not present

## 2021-07-21 DIAGNOSIS — E43 Unspecified severe protein-calorie malnutrition: Secondary | ICD-10-CM | POA: Diagnosis not present

## 2021-07-21 DIAGNOSIS — F1120 Opioid dependence, uncomplicated: Secondary | ICD-10-CM | POA: Diagnosis not present

## 2021-07-21 SURGERY — DEBRIDEMENT, WOUND, WITH CLOSURE
Anesthesia: General | Site: Arm Upper | Laterality: Right

## 2021-07-21 NOTE — Plan of Care (Signed)
°  Problem: Nutritional: °Goal: Nutritional status of the infant will improve as evidenced by minimal weight loss and appropriate weight gain for gestational age °Outcome: Progressing °Goal: Ability to maintain a balanced intake and output will improve °Outcome: Progressing °  °Problem: Clinical Measurements: °Goal: Ability to maintain clinical measurements within normal limits will improve °Outcome: Progressing °  °

## 2021-07-21 NOTE — TOC Progression Note (Addendum)
Transition of Care [facility]) - Progression Note    Patient Details  Name: Justin Fisher MRN: [id number] Date of Birth: 1986/05/13  Transition [facility]) CM/SW Contact  Janae Bridgeman, RN Phone Number: 07/21/2021, 10:48 AM  Clinical Narrative:    CM and MSW continue to follow the patient for wound care needs involving Right upper arm.  Patient currently has a wound vac and is being followed by Dr. Ulice Bold for surgical interventions/ Acell sheet per surgical notes.  TOC Team continuing to follow the patient for wound care needs and dme (dressing needs and/or wound vac needs).  CM sent a message to Dr. Ulice Bold, and the patient will need wound vac ordered prior to discharge and need for wound vac changes 2 times a week for the next 2 months.  I called multiple home health agencies including [facility], [facility], [facility] and [facility] and the agencies are unable to offer nursing services at the home due to an unsafe home environment involving recent IV drug use history.  I also called the [facility] care center and they have a waiting list for admission to the center with no availability at this time.  CM asked Dr. Ulice Bold if outpatient wound change appointments could be coordinated with her office two times per week - will follow up.  07/21/2021 1439 - I called and spoke with Donna Christen, [facility] representative and she will send an electric script to Dr. Ulice Bold to fill out and sign.  Will wait to hear back regarding follow up wound care appointments.    CM and MSW will continue to follow the patient for discharge needs for home - including wound vac and wound care appointments.   Expected Discharge Plan: Home/Self Care Barriers to Discharge: Continued Medical Work up  Expected Discharge Plan and Services Expected Discharge Plan: Home/Self Care In-house Referral: Clinical Social Work Discharge Planning Services: CM Consult Post Acute Care Choice:  Durable Medical Equipment (Patient has a WV at the present - TOC will follow for wound vac needs at discharge if needed per Surgery MD/Plastics MD) Living arrangements for the past 2 months: Single Family Home                                       Social Determinants of Health (SDOH) Interventions    Readmission Risk Interventions Readmission Risk Prevention Plan 07/17/2021  Transportation Screening Complete  PCP or Specialist Appt within 5-7 Days Complete  Home Care Screening Complete  Medication Review (RN CM) Complete  Some recent data might be hidden

## 2021-07-21 NOTE — Progress Notes (Signed)
[facility] PROGRESS NOTE  Justin Fisher [id number] DOB: 1985-09-21 DOA: 07/09/2021 PCP: Patient, No Pcp Per (Inactive)  Status: Remains inpatient appropriate because:  Unsafe discharge plan noting requires IV antibiotics for current medical condition history of IVD use not a candidate for home IV therapy; also requiring frequent reevaluation by surgical team regarding wound on upper right arm that is also requiring wound VAC therapy  Barriers to discharge: Social:Known history of active IV drug abuse prior to admission  Clinical: Continues to require IV antibiotics and regular wound therapy for wound upper extremity.  May require additional surgical procedures including plastic surgery  Level of care:  Med-Surg   Code Status: Full Family Communication: Patient only DVT prophylaxis: SCDs COVID vaccination status: Unknown    HPI: 36 year old M with PMH of IVDU (heroin, fentanyl and meth), anxiety, HLD, suicidal ideation and tobacco use disorder presenting to ED on 07/09/2021 with progressive severe right upper extremity pain, swelling, oozing brown and foul-smelling liquid after injecting IV drugs using a needle with blood in it, and admitted for sepsis due to right upper arm cellulitis/neck fasciitis/abscess/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 orthopedic surgery and ID consulted.  Initially started on vancomycin, Zosyn and clindamycin.  He underwent I&D with VAC placement on 1/5 and 1/8.  Blood cultures NGTD.  Surgical culture with group F Streptococcus.  Antibiotics de-escalated to IV Zosyn.  ID, orthopedic surgery and plastic surgery following.   Hospital course complicated by acute blood loss anemia related to surgery.  He had about 2 L blood transfused from 1/8-1/9.  Subjective: Sleeping soundly and did not awaken.  Objective: Vitals:   07/20/21 1148 07/21/21 0326  BP: 134/76 115/73  Pulse: 95   Resp: 18 19  Temp: 99.6 F (37.6 C) 98.1 F (36.7 C)   SpO2: 98% 100%    Intake/Output Summary (Last 24 hours) at 07/21/2021 0759 Last data filed at 07/20/2021 1700 Gross per 24 hour  Intake 480 ml  Output 1150 ml  Net -670 ml   Filed Weights   07/10/21 2308  Weight: 73.4 kg    Exam:  Constitutional: Being sound Respiratory: clear to auscultation bilaterally, no wheezing, no crackles. Normal respiratory effort.  RA Cardiovascular: Regular pulse, S1S2, No lower extremity edema.  Abdomen: no tenderness, no masses palpated. Bowel sounds positive. LBM 1/15 Skin: no rashes, lesions, ulcers. No induration.  Does have wound VAC over upper right arm over surgical wound Neurologic: Sleeping but at baseline CN 2-12 grossly intact. Sensation intact, Strength 5/5 x all 4 extremities.  Psychiatric: Sleeping   Assessment/Plan: Acute problems: Right upper arm cellulitis/neck fasciitis/acute osteomyelitis and patient with history of IVDU/s/p ACELL graft placement 1/13 -Superficial wound culture positive for moderate strep group F with intraoperative surgical culture from 1/5 with moderate strep group F; blood cultures negative -Orthopedic surgery, infectious disease and plastic surgery following. -IV Vanco, Zosyn and clinda>> IV Zosyn>> IV Unasyn until 07/26/2021 then p.o. Augmentin until 08/23/2021 -On methadone -continue IV Dilaudid for severe breakthrough pain and oxycodone for moderate breakthrough pain   Polysubstance use/IVDU including heroin, cocaine, fentanyl, meth and tobacco -UDS positive for cocaine on admission.  HIV nonreactive.  Hepatitis C antibody positive. -Continue nicotine patch -Hep B vaccine -Outpatient follow-up with [facility] upon discharge. -Methadone dose increased to 35 mg on 1/15.  No further dose adjustments until 72 hours after last dose adjustment.   Acute blood loss anemia superimposed on chronic iron deficiency anemia -Hgb 15.5 in 2018.   -s/p 5 units of PRBCs  this admission -Current hemoglobin 10.7   Uncontrolled  hypertension:  Not on medication at home. -Continue hydralazine as needed   Hyponatremia:  Resolved   Hypokalemia:  Resolved   History of anxiety/depression:  Stable. -On Vistaril at home -frequency increased this hospitalization to every 6 hours as needed -Anxiety has improved with the addition of methadone as above   Leukocytosis/bandemia/thrombocytosis:  Likely reactive due to #1. -Continue monitoring   Hepatitis C antibody positive -Follow quantitative RNA      Data Reviewed: Basic Metabolic Panel: Recent Labs  Lab 07/15/21 0245 07/18/21 0601 07/19/21 0038  NA 140 138 137  K 3.6 4.1 4.2  CL 107 105 101  CO2 23 25 27   GLUCOSE 114* 95 128*  BUN 10 12 19   CREATININE 0.53* 0.48* 0.73  CALCIUM 8.1* 8.8* 8.4*  MG 1.8  --   --   PHOS 3.6  --   --    Liver Function Tests: Recent Labs  Lab 07/15/21 0245 07/19/21 0038  AST  --  39  ALT  --  29  ALKPHOS  --  70  BILITOT  --  0.3  PROT  --  6.0*  ALBUMIN 2.2* 2.6*    CBC: Recent Labs  Lab 07/15/21 0245 07/18/21 0601 07/19/21 0038  WBC 14.3* 11.5* 12.9*  NEUTROABS  --   --  6.1  HGB 10.7* 10.7* 10.3*  HCT 33.1* 34.8* 33.4*  MCV 80.9 84.9 85.4  PLT 520* 516* 525*   Cardiac Enzymes: Recent Labs  Lab 07/15/21 0245  CKTOTAL 23*    No results found for this or any previous visit (from the past 240 hour(s)).      Scheduled Meds:  docusate sodium  100 mg Oral BID   feeding supplement  237 mL Oral BID BM   gabapentin  400 mg Oral TID   methadone  35 mg Oral Daily   mirtazapine  30 mg Oral QHS   multivitamin with minerals  1 tablet Oral Daily   sertraline  200 mg Oral Daily   traZODone  100 mg Oral QHS   Continuous Infusions:  ampicillin-sulbactam (UNASYN) IV 3 g (07/21/21 0745)    Principal Problem:   Sepsis ([place]) Active Problems:   Heroin use disorder, moderate, dependence (HCC)   Anemia   Cellulitis   Hypokalemia   Hyponatremia   Tobacco abuse   Protein-calorie malnutrition,  severe   Malnutrition of moderate degree   Abscess of right arm   Consultants: Orthopedic team Plastic surgery Infectious disease  Procedures: Intraoperative IND with wound VAC placement of RUE on 1/5 and 1/8 Echocardiogram  Antibiotics: Zosyn 1/4 through 1/9 Vancomycin 1/4 through 1/7 Cefazolin x1 dose 1/5 Clindamycin 1/5 through 1/7 Unasyn 1/9 >>   Time spent: 15 minutes    Erin Hearing ANP  [facility] 7 am - 330 pm/M-F for direct patient care and secure chat Please refer to Amion for contact info 12  days

## 2021-07-21 NOTE — Plan of Care (Signed)
  Problem: Nutritional: Goal: Nutritional status of the infant will improve as evidenced by minimal weight loss and appropriate weight gain for gestational age Outcome: Progressing Goal: Ability to maintain a balanced intake and output will improve Outcome: Progressing   Problem: Clinical Measurements: Goal: Ability to maintain clinical measurements within normal limits will improve Outcome: Progressing   Problem: Skin Integrity: Goal: Risk for impaired skin integrity will decrease Outcome: Progressing Goal: Demonstrates signs of wound healing without infection Outcome: Progressing   

## 2021-07-22 ENCOUNTER — Inpatient Hospital Stay (HOSPITAL_COMMUNITY): Payer: BC Managed Care – PPO | Admitting: Certified Registered"

## 2021-07-22 ENCOUNTER — Encounter (HOSPITAL_COMMUNITY)
Admission: EM | Disposition: A | Discharge status: Home / Self Care | Payer: Self-pay | Source: Home / Self Care | Attending: Internal Medicine

## 2021-07-22 ENCOUNTER — Encounter (HOSPITAL_COMMUNITY): Payer: Self-pay | Admitting: Internal Medicine

## 2021-07-22 DIAGNOSIS — S41101A Unspecified open wound of right upper arm, initial encounter: Secondary | ICD-10-CM

## 2021-07-22 DIAGNOSIS — L02413 Cutaneous abscess of right upper limb: Secondary | ICD-10-CM

## 2021-07-22 DIAGNOSIS — L03113 Cellulitis of right upper limb: Secondary | ICD-10-CM

## 2021-07-22 HISTORY — PX: APPLICATION OF WOUND VAC: SHX5189

## 2021-07-22 HISTORY — PX: INCISION AND DRAINAGE OF WOUND: SHX1803

## 2021-07-22 LAB — DRUG SCREEN 10 W/CONF, SERUM
Amphetamines, IA: NEGATIVE ng/mL
Barbiturates, IA: NEGATIVE ug/mL
Benzodiazepines, IA: NEGATIVE ng/mL
Cocaine & Metabolite, IA: NEGATIVE ng/mL
Methadone, IA: NEGATIVE ng/mL
Opiates, IA: NEGATIVE ng/mL
Oxycodones, IA: POSITIVE ng/mL — AB
Phencyclidine, IA: NEGATIVE ng/mL
Propoxyphene, IA: NEGATIVE ng/mL
THC(Marijuana) Metabolite, IA: NEGATIVE ng/mL

## 2021-07-22 LAB — OXYCODONES,MS,WB/SP RFX
Oxycocone: 1.2 ng/mL
Oxycodones Confirmation: POSITIVE
Oxymorphone: NEGATIVE ng/mL

## 2021-07-22 SURGERY — IRRIGATION AND DEBRIDEMENT WOUND
Anesthesia: General | Site: Arm Upper | Laterality: Right

## 2021-07-22 MED ORDER — PROPOFOL 10 MG/ML IV BOLUS
INTRAVENOUS | Status: DC | PRN
  Administered 2021-07-22: 200 mg via INTRAVENOUS
  Administered 2021-07-22: 50 mg via INTRAVENOUS

## 2021-07-22 MED ORDER — CHLORHEXIDINE GLUCONATE CLOTH 2 % EX PADS
6.0000 | MEDICATED_PAD | Freq: Once | CUTANEOUS | Status: AC
  Administered 2021-07-22: 6 via TOPICAL

## 2021-07-22 MED ORDER — ACETAMINOPHEN 10 MG/ML IV SOLN
1000.0000 mg | Freq: Once | INTRAVENOUS | Status: DC | PRN
  Administered 2021-07-22: 1000 mg via INTRAVENOUS

## 2021-07-22 MED ORDER — ACETAMINOPHEN 500 MG PO TABS: 1000.0000 mg | ORAL_TABLET | Freq: Once | ORAL | Status: DC | PRN

## 2021-07-22 MED ORDER — FENTANYL CITRATE (PF) 100 MCG/2ML IJ SOLN
INTRAMUSCULAR | Status: DC | PRN
  Administered 2021-07-22 (×5): 50 ug via INTRAVENOUS

## 2021-07-22 MED ORDER — OXYCODONE HCL 5 MG PO TABS: 5.0000 mg | ORAL_TABLET | Freq: Once | ORAL | Status: DC | PRN

## 2021-07-22 MED ORDER — PROPOFOL 10 MG/ML IV BOLUS
INTRAVENOUS | Status: AC
  Filled 2021-07-22: qty 20

## 2021-07-22 MED ORDER — ORAL CARE MOUTH RINSE: 15.0000 mL | Freq: Once | OROMUCOSAL | Status: AC

## 2021-07-22 MED ORDER — FENTANYL CITRATE (PF) 100 MCG/2ML IJ SOLN
INTRAMUSCULAR | Status: AC
  Filled 2021-07-22: qty 4

## 2021-07-22 MED ORDER — EPINEPHRINE PF 1 MG/ML IJ SOLN
INTRAMUSCULAR | Status: DC | PRN
Start: 2021-07-22 — End: 2021-07-22
  Administered 2021-07-22: 1 mg via SUBCUTANEOUS

## 2021-07-22 MED ORDER — THROMBIN 20000 UNITS EX KIT
PACK | CUTANEOUS | Status: DC | PRN
  Administered 2021-07-22: 20000 [IU] via TOPICAL

## 2021-07-22 MED ORDER — CEFAZOLIN SODIUM-DEXTROSE 2-4 GM/100ML-% IV SOLN
2.0000 g | INTRAVENOUS | Status: AC
  Administered 2021-07-22: 2 g via INTRAVENOUS
  Filled 2021-07-22: qty 100

## 2021-07-22 MED ORDER — SODIUM CHLORIDE 0.9 % IR SOLN
Status: DC | PRN
  Administered 2021-07-22: 3000 mL

## 2021-07-22 MED ORDER — CHLORHEXIDINE GLUCONATE CLOTH 2 % EX PADS: 6.0000 | MEDICATED_PAD | Freq: Once | CUTANEOUS | Status: DC

## 2021-07-22 MED ORDER — THROMBIN (RECOMBINANT) 5000 UNITS EX SOLR
CUTANEOUS | Status: AC
  Filled 2021-07-22: qty 5000

## 2021-07-22 MED ORDER — LIDOCAINE 2% (20 MG/ML) 5 ML SYRINGE
INTRAMUSCULAR | Status: DC | PRN
  Administered 2021-07-22: 60 mg via INTRAVENOUS

## 2021-07-22 MED ORDER — CHLORHEXIDINE GLUCONATE 0.12 % MT SOLN
OROMUCOSAL | Status: AC
  Administered 2021-07-22: 15 mL via OROMUCOSAL
  Filled 2021-07-22: qty 15

## 2021-07-22 MED ORDER — 0.9 % SODIUM CHLORIDE (POUR BTL) OPTIME
TOPICAL | Status: DC | PRN
  Administered 2021-07-22: 1000 mL

## 2021-07-22 MED ORDER — OXYCODONE HCL 5 MG PO TABS
ORAL_TABLET | ORAL | Status: AC
  Filled 2021-07-22: qty 2

## 2021-07-22 MED ORDER — OXYCODONE HCL 5 MG/5ML PO SOLN: 5.0000 mg | Freq: Once | ORAL | Status: DC | PRN

## 2021-07-22 MED ORDER — FENTANYL CITRATE (PF) 100 MCG/2ML IJ SOLN
25.0000 ug | INTRAMUSCULAR | Status: DC | PRN
  Administered 2021-07-22: 50 ug via INTRAVENOUS

## 2021-07-22 MED ORDER — MIDAZOLAM HCL 2 MG/2ML IJ SOLN
INTRAMUSCULAR | Status: AC
  Filled 2021-07-22: qty 2

## 2021-07-22 MED ORDER — FENTANYL CITRATE (PF) 250 MCG/5ML IJ SOLN
INTRAMUSCULAR | Status: AC
  Filled 2021-07-22: qty 5

## 2021-07-22 MED ORDER — ACETAMINOPHEN 160 MG/5ML PO SOLN: 1000.0000 mg | Freq: Once | ORAL | Status: DC | PRN

## 2021-07-22 MED ORDER — EPINEPHRINE PF 1 MG/ML IJ SOLN
INTRAMUSCULAR | Status: AC
  Filled 2021-07-22: qty 1

## 2021-07-22 MED ORDER — MIDAZOLAM HCL 5 MG/5ML IJ SOLN
INTRAMUSCULAR | Status: DC | PRN
  Administered 2021-07-22: 2 mg via INTRAVENOUS

## 2021-07-22 MED ORDER — METHADONE HCL 10 MG PO TABS
40.0000 mg | ORAL_TABLET | Freq: Every day | ORAL | Status: DC
  Administered 2021-07-23 – 2021-07-24 (×2): 40 mg via ORAL
  Filled 2021-07-22 (×2): qty 4

## 2021-07-22 MED ORDER — THROMBIN 20000 UNITS EX KIT
PACK | CUTANEOUS | Status: AC
  Filled 2021-07-22: qty 1

## 2021-07-22 MED ORDER — NICOTINE 7 MG/24HR TD PT24
7.0000 mg | MEDICATED_PATCH | Freq: Every day | TRANSDERMAL | Status: DC
  Administered 2021-07-22 – 2021-08-05 (×14): 7 mg via TRANSDERMAL
  Filled 2021-07-22 (×15): qty 1

## 2021-07-22 MED ORDER — NICOTINE POLACRILEX 2 MG MT GUM
4.0000 mg | CHEWING_GUM | OROMUCOSAL | Status: DC
  Administered 2021-07-22 – 2021-08-05 (×65): 4 mg via ORAL
  Filled 2021-07-22 (×70): qty 2

## 2021-07-22 MED ORDER — CHLORHEXIDINE GLUCONATE 0.12 % MT SOLN: 15.0000 mL | Freq: Once | OROMUCOSAL | Status: AC

## 2021-07-22 MED ORDER — ACETAMINOPHEN 10 MG/ML IV SOLN
INTRAVENOUS | Status: AC
  Filled 2021-07-22: qty 100

## 2021-07-22 MED ORDER — LACTATED RINGERS IV SOLN: INTRAVENOUS | Status: DC

## 2021-07-22 SURGICAL SUPPLY — 42 items
BAG COUNTER SPONGE SURGICOUNT (BAG) ×2 IMPLANT
CANISTER SUCT 3000ML PPV (MISCELLANEOUS) ×2 IMPLANT
COVER SURGICAL LIGHT HANDLE (MISCELLANEOUS) ×2 IMPLANT
DRAIN CHANNEL 19F RND (DRAIN) IMPLANT
DRAIN JP 10F RND SILICONE (MISCELLANEOUS) IMPLANT
DRAPE IMP U-DRAPE 54X76 (DRAPES) ×2 IMPLANT
DRAPE INCISE IOBAN 66X45 STRL (DRAPES) ×1 IMPLANT
DRSG CUTIMED SORBACT 7X9 (GAUZE/BANDAGES/DRESSINGS) ×1 IMPLANT
DRSG TELFA 3X8 NADH (GAUZE/BANDAGES/DRESSINGS) ×10 IMPLANT
DRSG VAC ATS LRG SENSATRAC (GAUZE/BANDAGES/DRESSINGS) ×1 IMPLANT
DRSG VAC ATS MED SENSATRAC (GAUZE/BANDAGES/DRESSINGS) IMPLANT
DRSG VAC ATS SM SENSATRAC (GAUZE/BANDAGES/DRESSINGS) IMPLANT
ELECT REM PT RETURN 9FT ADLT (ELECTROSURGICAL) ×2
ELECTRODE REM PT RTRN 9FT ADLT (ELECTROSURGICAL) ×1 IMPLANT
GLOVE SURG ENC MOIS LTX SZ7.5 (GLOVE) ×2 IMPLANT
GOWN STRL REUS W/ TWL LRG LVL3 (GOWN DISPOSABLE) ×1 IMPLANT
GOWN STRL REUS W/TWL LRG LVL3 (GOWN DISPOSABLE) ×1
GOWN STRL REUS W/TWL XL LVL3 (GOWN DISPOSABLE) ×4 IMPLANT
KIT BASIN OR (CUSTOM PROCEDURE TRAY) ×2 IMPLANT
KIT TURNOVER KIT B (KITS) ×2 IMPLANT
MATRIX SURG PERF 3LAYER 16X25 (Tissue) ×1 IMPLANT
MICROMATRIX 1000MG (Tissue) ×4 IMPLANT
NS IRRIG 1000ML POUR BTL (IV SOLUTION) ×3 IMPLANT
PACK GENERAL/GYN (CUSTOM PROCEDURE TRAY) ×2 IMPLANT
PAD ARMBOARD 7.5X6 YLW CONV (MISCELLANEOUS) ×4 IMPLANT
PAD DRESSING TELFA 3X8 NADH (GAUZE/BANDAGES/DRESSINGS) IMPLANT
SET CYSTO W/LG BORE CLAMP LF (SET/KITS/TRAYS/PACK) ×1 IMPLANT
SOLUTION PARTIC MCRMTRX 1000MG (Tissue) IMPLANT
STAPLER VISISTAT 35W (STAPLE) ×2 IMPLANT
STOCKINETTE IMPERVIOUS 9X36 MD (GAUZE/BANDAGES/DRESSINGS) ×1 IMPLANT
SURGILUBE 2OZ TUBE FLIPTOP (MISCELLANEOUS) IMPLANT
SUT MNCRL AB 3-0 PS2 27 (SUTURE) IMPLANT
SUT MNCRL AB 4-0 PS2 18 (SUTURE) IMPLANT
SUT MON AB 2-0 CT1 36 (SUTURE) IMPLANT
SUT MON AB 5-0 PS2 18 (SUTURE) IMPLANT
SUT VIC AB 3-0 SH 27 (SUTURE) ×10
SUT VIC AB 3-0 SH 27X BRD (SUTURE) IMPLANT
SUT VIC AB 5-0 PS2 18 (SUTURE) IMPLANT
SUT VICRYL 3 0 (SUTURE) IMPLANT
SYR CONTROL 10ML LL (SYRINGE) ×2 IMPLANT
TOWEL GREEN STERILE (TOWEL DISPOSABLE) ×2 IMPLANT
UNDERPAD 30X36 HEAVY ABSORB (UNDERPADS AND DIAPERS) ×2 IMPLANT

## 2021-07-22 NOTE — Interval H&P Note (Signed)
History and Physical Interval Note:  07/22/2021 9:39 AM  Justin Fisher  has presented today for surgery, with the diagnosis of Right arm wound.  The various methods of treatment have been discussed with the patient and family. After consideration of risks, benefits and other options for treatment, the patient has consented to  Procedure(s): IRRIGATION AND DEBRIDEMENT RIGHT ARM  WOUND (Right) WOUND VAC CHANGE (Right) POSSIBLE APPLICATION OF SKIN SUBSTITUTE (Right) as a surgical intervention.  The patient's history has been reviewed, patient examined, no change in status, stable for surgery.  I have reviewed the patient's chart and labs.  Questions were answered to the patient's satisfaction.     Janne Napoleon

## 2021-07-22 NOTE — TOC Progression Note (Addendum)
Transition of Care [facility]) - Progression Note    Patient Details  Name: Justin Fisher MRN: [id number] Date of Birth: 1985/09/30  Transition of Care [facility]) CM/SW Contact  Curlene Labrum, RN Phone Number: 07/22/2021, 9:52 AM  Clinical Narrative:    Case management sent a message to Dr. Marla Roe, Justin Fisher, Justin Fisher and Dr. Erin Fisher regarding patient's need for outpatient woundcare appointment follow up once patient is stable to discharge from the hospital to home.  I was unable to obtain home health care RN due to patient's recent IV drug history.  Numerous home health agencies were unable to provide services and the patient will need complex wound care changes once discharged from the hospital.  I requested that patient have follow up visits with either orthopedics of plastic surgery MD office for follow up to assist the patient with dressing changes as ordered.  The patient has available roommates to assist per the patient and physicians included in the conversation above are aware.  This may be the only alternative available for outpatient woundcare follow up, since [facility] was called yesterday and the facility is full and unable to provide services to the patient.   I paged the [facility] wound care RN on call today to see if she has alternative outpatient wound care availability or is able to obtain referral to wound care clinic.  I spoke with Justin Shan Souris RN [phone] and she states that she is unable to obtain appointment for patient at [facility] nor [facility].  Both Wound care facilities are full and not accepting patients for outside referral per Wound care nurse.  The patient will need follow up wound care appointment with orthopedic physician or plastic surgery MD.  Justin Fisher, [place] representative was called yesterday, 07/21/2021 to obtain wound vac for home and delivered to the patient's hospital room  prior to discharge to home.  Justin Hearing, NP is aware of pending needs for outpatient wound care.  07/22/2021 1034-  CM called and spoke with Justin Fisher, CM with [employer] and she will send e-script to Dr. Marla Roe and/or PA with Dr. Eusebio Friendly office to obtain wound vac and dressing supplies for the patient.  07/22/2021 - 1210 - CM noted that patient returned to surgery today.  Dr. Tyrone Apple, plastic surgery physican states that plans are for the patient to remain hospitalized with woundvac at this time and return for surgery next week for skin graft.  Luggens, MD is aware that patient will need wound care follow up as outpatient.  CM and MSW will continue to follow the patient for dme and wound care needs for home.   CM spoke with Dr. Tyrone Apple - and MD clarified that patient is scheduled for OR next week and will require an initial wound vac over the skin graft but after the first dressing taken-down - wound vac will be discontinued after the first dressing takedown.  Patient will need follow up wound care with plastic surgery.  Patient unable to receive home health services and roommate and patient will need to be taught wound care instructions for home after next surgery.   Expected Discharge Plan: Home/Self Care Barriers to Discharge: Continued Medical Work up  Expected Discharge Plan and Services Expected Discharge Plan: Home/Self Care In-house Referral: Clinical Social Work Discharge Planning Services: CM Consult Post Acute Care Choice: Durable Medical Equipment (Patient has a [place] at the present - TOC will follow for wound vac  needs at discharge if needed per Surgery MD/Plastics MD) Living arrangements for the past 2 months: Single Family Home                                       Social Determinants of Health (SDOH) Interventions    Readmission Risk Interventions Readmission Risk Prevention Plan 07/17/2021  Transportation Screening Complete  PCP or Specialist Appt within 5-7  Days Complete  Home Care Screening Complete  Medication Review (RN CM) Complete  Some recent data might be hidden

## 2021-07-22 NOTE — Progress Notes (Signed)
Mobility Specialist Progress Note  Cancelled Mobility Session: Pt eating dinner upon entry and stating to be in a little pain. Unable to stay until pt finished. Will f/u on mobility progress tomorrow if time permits.  Holland Falling Mobility Specialist Phone Number [phone]

## 2021-07-22 NOTE — Op Note (Addendum)
Operative Note   DATE OF OPERATION: 07/22/2021  SURGICAL DEPARTMENT: Plastic Surgery  PREOPERATIVE DIAGNOSES: right arm open wound  POSTOPERATIVE DIAGNOSES:  same  PROCEDURE:   1)  debridement with preparation of right arm for placement of graft 10x20 cm (200 cm2) 2)  Placement of Acell 10x20 cm2 (200 cm2) right arm 3)  Placement of wound vac right arm 10x20 cm  SURGEON: Evona Westra P. Camyra Vaeth, MD  ASSISTANT: General.  ANESTHESIA:  General.   COMPLICATIONS: None.   INDICATIONS FOR PROCEDURE:  The patient, Justin Fisher is a 36 y.o. male born on [birth year], is here for treatment of open wound right arm. MRN: [id number]  CONSENT:  Informed consent was obtained directly from the patient. Risks, benefits and alternatives were fully discussed. Specific risks including but not limited to bleeding, infection, hematoma, seroma, scarring, pain, contracture, asymmetry, wound healing problems, and need for further surgery were all discussed. The patient did have an ample opportunity to have questions answered to satisfaction.   DESCRIPTION OF PROCEDURE:  The patient was taken to the operating room. SCDs were placed and antibiotics were given. General anesthesia was administered.  The patient's operative site was prepped and draped in a sterile fashion. A time out was performed and all information was confirmed to be correct.    After prepping and draping the right arm the arm was irrigated with 2 L of saline.  Following this attention was turned toward debridement.  Curette primarily was used to debride the top layer of granulation tissue and any small areas of muscle or muscle fascia that were less healthy.  Following this hemostasis was achieved using Bovie electrocautery as well as spray thrombin.  Hemostasis was satisfactory.  Following this an ACell sheet was opened and sutured in place using Vicryl sutures.  Following this a  sorbact mesh was sutured over the ACell with Vicryl sutures.   Following this VAC sponge was applied over the sorbact.  A good seal was obtained with seal check.  The patient was stable throughout the procedure and tolerated anesthesia well.  The patient tolerated the procedure well.  There were no complications. The patient was allowed to wake from anesthesia, extubated and taken to the recovery room in satisfactory condition.

## 2021-07-22 NOTE — Anesthesia Preprocedure Evaluation (Signed)
Anesthesia Evaluation  Patient identified by MRN, date of birth, ID band Patient awake    Reviewed: Allergy & Precautions, H&P , NPO status , Patient's Chart, lab work & pertinent test results  History of Anesthesia Complications Negative for: history of anesthetic complications  Airway Mallampati: II  TM Distance: >3 FB Neck ROM: Full    Dental  (+) Dental Advisory Given   Pulmonary neg shortness of breath, Current Smoker and Patient abstained from smoking.,    breath sounds clear to auscultation       Cardiovascular negative cardio ROS   Rhythm:Regular     Neuro/Psych PSYCHIATRIC DISORDERS Anxiety Depression negative neurological ROS     GI/Hepatic negative GI ROS, (+)     substance abuse  IV drug use,   Endo/Other  negative endocrine ROS  Renal/GU negative Renal ROSLab Results      Component                Value               Date                      CREATININE               0.73                07/19/2021                Musculoskeletal   Abdominal   Peds  Hematology  (+) Blood dyscrasia, anemia , Lab Results      Component                Value               Date                      WBC                      12.9 (H)            07/19/2021                HGB                      10.3 (L)            07/19/2021                HCT                      33.4 (L)            07/19/2021                MCV                      85.4                07/19/2021                PLT                      525 (H)             07/19/2021              Anesthesia Other Findings   Reproductive/Obstetrics  Anesthesia Physical Anesthesia Plan  ASA: 2  Anesthesia Plan: General   Post-op Pain Management: Toradol IV (intra-op) and Ofirmev IV (intra-op)   Induction: Intravenous  PONV Risk Score and Plan: 1 and Ondansetron and Dexamethasone  Airway Management Planned: LMA  and Oral ETT  Additional Equipment: None  Intra-op Plan:   Post-operative Plan: Extubation in OR  Informed Consent: I have reviewed the patients History and Physical, chart, labs and discussed the procedure including the risks, benefits and alternatives for the proposed anesthesia with the patient or authorized representative who has indicated his/her understanding and acceptance.     Dental advisory given  Plan Discussed with: CRNA and Anesthesiologist  Anesthesia Plan Comments:         Anesthesia Quick Evaluation

## 2021-07-22 NOTE — Transfer of Care (Signed)
Immediate Anesthesia Transfer of Care Note  Patient: Justin Fisher  Procedure(s) Performed: IRRIGATION AND DEBRIDEMENT RIGHT ARM  WOUND (Right: Arm Upper) WOUND VAC CHANGE (Right: Arm Upper) POSSIBLE APPLICATION OF SKIN SUBSTITUTE (Right: Arm Upper)  Patient Location: PACU  Anesthesia Type:General  Level of Consciousness: awake, alert  and oriented  Airway & Oxygen Therapy: Patient Spontanous Breathing and Patient connected to face mask oxygen  Post-op Assessment: Report given to RN and Post -op Vital signs reviewed and stable  Post vital signs: Reviewed and stable  Last Vitals:  Vitals Value Taken Time  BP 141/74 07/22/21 1138  Temp    Pulse 94 07/22/21 1143  Resp 11 07/22/21 1143  SpO2 98 % 07/22/21 1143  Vitals shown include unvalidated device data.  Last Pain:  Vitals:   07/22/21 0913  TempSrc:   PainSc: 6       Patients Stated Pain Goal: 2 (07/22/21 0913)  Complications: No notable events documented.

## 2021-07-22 NOTE — Anesthesia Procedure Notes (Signed)
Procedure Name: LMA Insertion Date/Time: 07/22/2021 10:19 AM Performed by: Colon Flattery, CRNA Pre-anesthesia Checklist: Patient identified, Emergency Drugs available, Suction available and Patient being monitored Patient Re-evaluated:Patient Re-evaluated prior to induction Oxygen Delivery Method: Circle system utilized Preoxygenation: Pre-oxygenation with 100% oxygen Induction Type: IV induction Ventilation: Mask ventilation without difficulty LMA: LMA inserted LMA Size: 5.0 Placement Confirmation: positive ETCO2 Dental Injury: Teeth and Oropharynx as per pre-operative assessment

## 2021-07-22 NOTE — Progress Notes (Signed)
[facility] PROGRESS NOTE  Justin Fisher [id number] DOB: Feb 19, 1986 DOA: 07/09/2021 PCP: Patient, No Pcp Per (Inactive)  Status: Remains inpatient appropriate because:  Unsafe discharge plan noting requires IV antibiotics for current medical condition history of IVD use not a candidate for home IV therapy; also requiring frequent reevaluation by surgical team regarding wound on upper right arm that is also requiring wound VAC therapy  Barriers to discharge: Social:Known history of active IV drug abuse prior to admission  Clinical: Continues to require IV antibiotics and regular wound therapy for wound upper extremity.  May require additional surgical procedures including plastic surgery  Level of care:  Med-Surg   Code Status: Full Family Communication: Patient only DVT prophylaxis: SCDs COVID vaccination status: Unknown    HPI: 36 year old M with PMH of IVDU (heroin, fentanyl and meth), anxiety, HLD, suicidal ideation and tobacco use disorder presenting to ED on 07/09/2021 with progressive severe right upper extremity pain, swelling, oozing brown and foul-smelling liquid after injecting IV drugs using a needle with blood in it, and admitted for sepsis due to right upper arm cellulitis/neck fasciitis/abscess/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 orthopedic surgery and ID consulted.  Initially started on vancomycin, Zosyn and clindamycin.  He underwent I&D with VAC placement on 1/5 and 1/8.  Blood cultures NGTD.  Surgical culture with group F Streptococcus.  Antibiotics de-escalated to IV Zosyn.  ID, orthopedic surgery and plastic surgery following.   Hospital course complicated by acute blood loss anemia related to surgery.  He had about 2 L blood transfused from 1/8-1/9.  Subjective: Am in postop after another debridement and application of ACell graft applied.  Patient requesting nicotine patch in addition to nicotine gum  Objective: Vitals:   07/21/21 1145  07/21/21 2132  BP: (!) 111/56 116/60  Pulse: 100 (!) 101  Resp: 16 19  Temp: 98.4 F (36.9 C) 97.7 F (36.5 C)  SpO2: 94% 97%    Intake/Output Summary (Last 24 hours) at 07/22/2021 0824 Last data filed at 07/22/2021 [id number] Gross per 24 hour  Intake 440 ml  Output 1200 ml  Net -760 ml   Filed Weights   07/10/21 2308  Weight: 73.4 kg    Exam:  Constitutional: No acute distress Respiratory: clear to auscultation bilaterally, no wheezing, no crackles. Normal respiratory effort.  RA Cardiovascular: Regular pulse, S1S2, No lower extremity edema.  Abdomen: no tenderness, no masses palpated. Bowel sounds positive. LBM 1/16 Skin: no rashes, lesions, ulcers. No induration.  Does have wound VAC over upper right arm over surgical wound Neurologic: Sleeping but at baseline CN 2-12 grossly intact. Sensation intact, Strength 5/5 x all 4 extremities.  Psychiatric: Awake and oriented x3.  Sitting on side of bed getting ready to eat postoperative meal   Assessment/Plan: Acute problems: Right upper arm cellulitis/neck fasciitis/acute osteomyelitis and patient with history of IVDU/s/p ACELL graft placement 1/13 -Superficial wound culture positive for moderate strep group F with intraoperative surgical culture from 1/5 with moderate strep group F; blood cultures negative -Orthopedic surgery, infectious disease and plastic surgery following. -IV Vanco, Zosyn and clinda>> IV Zosyn>> IV Unasyn until 07/26/2021 then p.o. Augmentin until 08/23/2021 -On methadone -continue IV Dilaudid for severe breakthrough pain and oxycodone for moderate breakthrough pain -TOC discussed with surgeon and plans are to proceed with skin graft in about 1 week with short-term inpatient application of wound VAC to be followed by traditional wound care upon discharge.  Given his substance abuse history he is not eligible for home health services  Polysubstance use/IVDU including heroin, cocaine, fentanyl, meth and tobacco -UDS  positive for cocaine on admission.  HIV nonreactive.  Hepatitis C antibody positive. -Continue nicotine gum but adjust schedule to every 4 hours while awake and add a low-dose nicotine patch 7 mg/day -Hep B vaccine -Outpatient follow-up with [facility] upon discharge. -Methadone dose increased to 35 mg on 1/15.  No further dose adjustments until 72 hours after last dose adjustment.  We will increase to 40 mg beginning on a.m. 1/18   Acute blood loss anemia superimposed on chronic iron deficiency anemia -Hgb 15.5 in 2018.   -s/p 5 units of PRBCs this admission -Current hemoglobin 10.7   Uncontrolled hypertension:  Not on medication at home. -Continue hydralazine as needed   Hyponatremia:  Resolved   Hypokalemia:  Resolved   History of anxiety/depression:  Stable. -On Vistaril at home -frequency increased this hospitalization to every 6 hours as needed -Anxiety has improved with the addition of methadone as above   Leukocytosis/bandemia/thrombocytosis:  Likely reactive due to #1. -Continue monitoring   Hepatitis C antibody positive -Follow quantitative RNA      Data Reviewed: Basic Metabolic Panel: Recent Labs  Lab 07/18/21 0601 07/19/21 0038  NA 138 137  K 4.1 4.2  CL 105 101  CO2 25 27  GLUCOSE 95 128*  BUN 12 19  CREATININE 0.48* 0.73  CALCIUM 8.8* 8.4*   Liver Function Tests: Recent Labs  Lab 07/19/21 0038  AST 39  ALT 29  ALKPHOS 70  BILITOT 0.3  PROT 6.0*  ALBUMIN 2.6*    CBC: Recent Labs  Lab 07/18/21 0601 07/19/21 0038  WBC 11.5* 12.9*  NEUTROABS  --  6.1  HGB 10.7* 10.3*  HCT 34.8* 33.4*  MCV 84.9 85.4  PLT 516* 525*   Cardiac Enzymes: No results for input(s): CKTOTAL, CKMB, CKMBINDEX, TROPONINI in the last 168 hours.   No results found for this or any previous visit (from the past 240 hour(s)).      Scheduled Meds:  Chlorhexidine Gluconate Cloth  6 each Topical Once   docusate sodium  100 mg Oral BID   feeding supplement  237 mL  Oral BID BM   gabapentin  400 mg Oral TID   methadone  35 mg Oral Daily   mirtazapine  30 mg Oral QHS   multivitamin with minerals  1 tablet Oral Daily   sertraline  200 mg Oral Daily   traZODone  100 mg Oral QHS   Continuous Infusions:  ampicillin-sulbactam (UNASYN) IV 3 g (07/22/21 0804)    ceFAZolin (ANCEF) IV      Principal Problem:   Sepsis ([place]) Active Problems:   Heroin use disorder, moderate, dependence (HCC)   Anemia   Cellulitis   Hypokalemia   Hyponatremia   Tobacco abuse   Protein-calorie malnutrition, severe   Malnutrition of moderate degree   Abscess of right arm   Consultants: Orthopedic team Plastic surgery Infectious disease  Procedures: Intraoperative IND with wound VAC placement of RUE on 1/5 and 1/8 Echocardiogram  Antibiotics: Zosyn 1/4 through 1/9 Vancomycin 1/4 through 1/7 Cefazolin x1 dose 1/5 Clindamycin 1/5 through 1/7 Unasyn 1/9 >>   Time spent: 15 minutes    Erin Hearing ANP  [facility] 7 am - 330 pm/M-F for direct patient care and secure chat Please refer to Amion for contact info 13  days

## 2021-07-23 ENCOUNTER — Encounter (HOSPITAL_COMMUNITY): Payer: Self-pay | Admitting: Plastic Surgery

## 2021-07-23 DIAGNOSIS — L02413 Cutaneous abscess of right upper limb: Secondary | ICD-10-CM | POA: Diagnosis not present

## 2021-07-23 DIAGNOSIS — F1120 Opioid dependence, uncomplicated: Secondary | ICD-10-CM | POA: Diagnosis not present

## 2021-07-23 MED ORDER — THROMBIN 20000 UNITS EX KIT
PACK | CUTANEOUS | Status: DC | PRN
  Administered 2021-07-23: 20000 [IU] via TOPICAL

## 2021-07-23 NOTE — Progress Notes (Signed)
Nutrition Follow-up  DOCUMENTATION CODES:   Non-severe (moderate) malnutrition in context of social or environmental circumstances  INTERVENTION:  -continue Ensure Enlive po BID, each supplement provides 350 kcal and 20 grams of protein -continue MVI with minerals daily  NUTRITION DIAGNOSIS:   Moderate Malnutrition related to social / environmental circumstances (IV drug use) as evidenced by mild fat depletion, moderate muscle depletion.  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  1/05 s/p I&D R arm w/ VAC placement  1/08 s/p I&D R arm w/ VAC placement 1/11 s/p I&D R arm w/ VAC placement 1/13 s/p prep of R arm for Acell placement, evacuation of 30ml hematoma, placement of VAC 1/17 s/p I&D R arm for placement of graft, placement of Acell R arm, placement of wound VAC R arm  Pt pending another surgery and transition from Acell to true skin graft prior to being able to discharge. Will d/c at discretion of plastic surgery team per NP.   Reports good appetite and supplement acceptance.   PO intake: 0-100% x 6 recorded meals (75% avg meal intake)  UOP: x24 hours I/O: - since admit  Medications: Scheduled Meds:  docusate sodium  100 mg Oral BID   feeding supplement  237 mL Oral BID BM   gabapentin  400 mg Oral TID   methadone  40 mg Oral Daily   mirtazapine  30 mg Oral QHS   multivitamin with minerals  1 tablet Oral Daily   nicotine  7 mg Transdermal Daily   nicotine polacrilex  4 mg Oral Q4H while awake   sertraline  200 mg Oral Daily   traZODone  100 mg Oral QHS  Continuous Infusions:  ampicillin-sulbactam (UNASYN) IV 3 g  (07/23/21 0919)    Labs: Recent Labs  Lab 07/18/21 0601 07/19/21 0038  NA 138 137  K 4.1 4.2  CL 105 101  CO2 25 27  BUN 12 19  CREATININE 0.48* 0.73  CALCIUM 8.8* 8.4*  GLUCOSE 95 128*    Diet Order:   Diet Order             Diet regular Room service appropriate? Yes; Fluid consistency: Thin  Diet effective now                   EDUCATION NEEDS:   Education needs have been addressed  Skin:  Skin Assessment: Skin Integrity Issues: Skin Integrity Issues:: Incisions Incisions: R arm  Last BM:  1/17  Height:   Ht Readings from Last 1 Encounters:  07/10/21 5\' 7"  (1.702 m)    Weight:   Wt Readings from Last 1 Encounters:  07/10/21 73.4 kg    BMI:  Body mass index is 25.34 kg/m.  Estimated Nutritional Needs:   Kcal:  2200-2400  Protein:  110-130g  Fluid:  >/=2.2L     09/07/21., MS, RD, LDN (she/her/hers) RD pager number and weekend/on-call pager number located in Amion.

## 2021-07-23 NOTE — Anesthesia Postprocedure Evaluation (Signed)
Anesthesia Post Note  Patient: Jamarquez Dalto  Procedure(s) Performed: IRRIGATION AND DEBRIDEMENT RIGHT ARM  WOUND (Right: Arm Upper) WOUND VAC CHANGE (Right: Arm Upper) POSSIBLE APPLICATION OF SKIN SUBSTITUTE (Right: Arm Upper)     Patient location during evaluation: PACU Anesthesia Type: General Level of consciousness: awake and alert Pain management: pain level controlled Vital Signs Assessment: post-procedure vital signs reviewed and stable Respiratory status: spontaneous breathing, nonlabored ventilation, respiratory function stable and patient connected to nasal cannula oxygen Cardiovascular status: blood pressure returned to baseline and stable Postop Assessment: no apparent nausea or vomiting Anesthetic complications: no   No notable events documented.  Last Vitals:  Vitals:   07/22/21 2124 07/23/21 0756  BP: 121/79 123/82  Pulse: (!) 110   Resp: 17 16  Temp: 36.7 C 36.8 C  SpO2: 100%     Last Pain:  Vitals:   07/23/21 1419  TempSrc:   PainSc: 8    Pain Goal: Patients Stated Pain Goal: 2 (07/22/21 0913)                 Jamiere Gulas

## 2021-07-23 NOTE — Progress Notes (Signed)
1 Day Post-Op  Subjective: 36 year old male status post debridement of right arm wound, application of wound matrix and reapplication of wound VAC with Dr. Erin Hearing yesterday 07/22/2021.  Patient is resting in bed today on evaluation, reports he is doing well.  He reports the function of his hand is significantly improved, he feels that the swelling is significantly improved as well.  He reports still having some difficulty with raising his right arm at the shoulder joint.  He is not having any infectious symptoms.  Objective: Vital signs in last 24 hours: Temp:  [97.7 F (36.5 C)-98 F (36.7 C)] 98 F (36.7 C) (01/17 2124) Pulse Rate:  [95-110] 110 (01/17 2124) Resp:  [12-17] 17 (01/17 2124) BP: (119-141)/(73-91) 121/79 (01/17 2124) SpO2:  [91 %-100 %] 100 % (01/17 2124) Last BM Date: 07/21/21  Intake/Output from previous day: 01/17 0701 - 01/18 0700 In: 2551 [P.O.:360; I.V.:800; IV Piggyback:1391] Out: 2900 [Urine:2900] Intake/Output this shift: No intake/output data recorded.  General appearance: alert, cooperative, and no distress Resp: Unlabored Extremities: extremities normal, atraumatic, no cyanosis or edema Incision/Wound: Right arm wound VAC in place, good seal noted.  Scant amount of drainage noted in canister.  Palpable radial pulse.  No erythema of the right upper extremity.  Improved range of motion of right hand with good flexion and extension.   Lab Results:  CBC Latest Ref Rng & Units 07/19/2021 07/18/2021 07/15/2021  WBC 4.0 - 10.5 K/uL 12.9(H) 11.5(H) 14.3(H)  Hemoglobin 13.0 - 17.0 g/dL 10.3(L) 10.7(L) 10.7(L)  Hematocrit 39.0 - 52.0 % 33.4(L) 34.8(L) 33.1(L)  Platelets 150 - 400 K/uL 525(H) 516(H) 520(H)    BMET No results for input(s): NA, K, CL, CO2, GLUCOSE, BUN, CREATININE, CALCIUM in the last 72 hours. PT/INR No results for input(s): LABPROT, INR in the last 72 hours. ABG No results for input(s): PHART, HCO3 in the last 72 hours.  Invalid input(s):  PCO2, PO2  Studies/Results: No results found.  Anti-infectives: Anti-infectives (From admission, onward)    Start     Dose/Rate Route Frequency Ordered Stop   07/22/21 0830  ceFAZolin (ANCEF) IVPB 2g/100 mL premix        2 g 200 mL/hr over 30 Minutes Intravenous On call to O.R. 07/22/21 0744 07/22/21 1036   07/18/21 0845  ceFAZolin (ANCEF) IVPB 2g/100 mL premix        2 g 200 mL/hr over 30 Minutes Intravenous On call to O.R. 07/18/21 0745 07/18/21 1535   07/14/21 1400  Ampicillin-Sulbactam (UNASYN) 3 g in sodium chloride 0.9 % 100 mL IVPB        3 g 200 mL/hr over 30 Minutes Intravenous Every 6 hours 07/14/21 1106 07/26/21 2359   07/14/21 1015  Ampicillin-Sulbactam (UNASYN) 3 g in sodium chloride 0.9 % 100 mL IVPB  Status:  Discontinued        3 g 200 mL/hr over 30 Minutes Intravenous Every 6 hours 07/14/21 0924 07/14/21 1106   07/11/21 0600  ceFAZolin (ANCEF) IVPB 2g/100 mL premix  Status:  Discontinued        2 g 200 mL/hr over 30 Minutes Intravenous On call to O.R. 07/10/21 1347 07/10/21 1352   07/10/21 1445  ceFAZolin (ANCEF) IVPB 2g/100 mL premix        2 g 200 mL/hr over 30 Minutes Intravenous On call to O.R. 07/10/21 1352 07/10/21 1636   07/10/21 1300  clindamycin (CLEOCIN) IVPB 900 mg  Status:  Discontinued        900 mg 100 mL/hr  over 30 Minutes Intravenous Every 8 hours 07/10/21 0018 07/13/21 1049   07/10/21 1000  vancomycin (VANCOREADY) IVPB 1250 mg/250 mL  Status:  Discontinued        1,250 mg 166.7 mL/hr over 90 Minutes Intravenous Every 12 hours 07/09/21 2121 07/12/21 1333   07/10/21 1000  piperacillin-tazobactam (ZOSYN) IVPB 3.375 g  Status:  Discontinued        3.375 g 12.5 mL/hr over 240 Minutes Intravenous Every 8 hours 07/10/21 0912 07/14/21 0924   07/09/21 2200  piperacillin-tazobactam (ZOSYN) IVPB 3.375 g  Status:  Discontinued        3.375 g 12.5 mL/hr over 240 Minutes Intravenous Every 8 hours 07/09/21 1958 07/10/21 0912   07/09/21 2000  vancomycin  (VANCOREADY) IVPB 1500 mg/300 mL        1,500 mg 150 mL/hr over 120 Minutes Intravenous  Once 07/09/21 1956 07/10/21 0043   07/09/21 1845  vancomycin (VANCOCIN) IVPB 1000 mg/200 mL premix  Status:  Discontinued        1,000 mg 200 mL/hr over 60 Minutes Intravenous  Once 07/09/21 1843 07/09/21 2121       Assessment/Plan: s/p Procedure(s): IRRIGATION AND DEBRIDEMENT RIGHT ARM  WOUND WOUND VAC CHANGE POSSIBLE APPLICATION OF SKIN SUBSTITUTE  36 year old male status postdebridement of right arm wound on 07/22/2021 with Dr. Erin Hearing.  Patient is doing well this AM.  Wound VAC in place with good seal noted.  Will discuss further surgical intervention with surgical team, may need to return to the OR next week for additional wound VAC change, reapplication of wound matrix and reevaluation.  Discussed with patient that skin graft may be necessary.  All of his questions were answered to his content.     LOS: 14 days    Charlies Constable, PA-C 07/23/2021

## 2021-07-23 NOTE — Progress Notes (Signed)
Mobility Specialist Progress Note   07/23/21 1720  Mobility  Activity Ambulated independently in hallway  Level of Assistance Modified independent, requires aide device or extra time  Assistive Device None  Distance Ambulated (ft) 980 ft  Activity Response Tolerated well  $Mobility charge 1 Mobility   Pt increasing activity tolerance, asx throughout ambulation. Returned back to EOB w/ needs met and call bell in reach.  Holland Falling Mobility Specialist Phone Number [phone]

## 2021-07-23 NOTE — Plan of Care (Signed)
  Problem: Education: Goal: Knowledge of General Education information will improve Description: Including pain rating scale, medication(s)/side effects and non-pharmacologic comfort measures Outcome: Progressing   Problem: Clinical Measurements: Goal: Ability to maintain clinical measurements within normal limits will improve Outcome: Progressing Goal: Will remain free from infection Outcome: Progressing   Problem: Coping: Goal: Level of anxiety will decrease Outcome: Progressing   Problem: Pain Managment: Goal: General experience of comfort will improve Outcome: Progressing   

## 2021-07-23 NOTE — Progress Notes (Signed)
[facility] PROGRESS NOTE  Justin Fisher [id number] DOB: [birth year] DOA: 07/09/2021 PCP: Patient, No Pcp Per (Inactive)  Status: Remains inpatient appropriate because:  Unsafe discharge plan noting requires IV antibiotics for current medical condition history of IVD use not a candidate for home IV therapy; also requiring frequent reevaluation by surgical team regarding wound on upper right arm that is also requiring wound VAC therapy  Barriers to discharge: Social:Known history of active IV drug abuse prior to admission  Clinical: Continues to require IV antibiotics and regular wound therapy for wound upper extremity.  May require additional surgical procedures including plastic surgery  Level of care:  Med-Surg   Code Status: Full Family Communication: Patient only DVT prophylaxis: SCDs COVID vaccination status: Unknown    HPI: 36 year old M with PMH of IVDU (heroin, fentanyl and meth), anxiety, HLD, suicidal ideation and tobacco use disorder presenting to ED on 07/09/2021 with progressive severe right upper extremity pain, swelling, oozing brown and foul-smelling liquid after injecting IV drugs using a needle with blood in it, and admitted for sepsis due to right upper arm cellulitis/neck fasciitis/abscess/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 orthopedic surgery and ID consulted.  Initially started on vancomycin, Zosyn and clindamycin.  He underwent I&D with VAC placement on 1/5 and 1/8.  Blood cultures NGTD.  Surgical culture with group F Streptococcus.  Antibiotics de-escalated to IV Zosyn.  ID, orthopedic surgery and plastic surgery following.   Hospital course complicated by acute blood loss anemia related to surgery.  He had about 2 L blood transfused from 1/8-1/9.  Subjective: Awake.  Questions answered regarding dosing adjustments and methadone and anticipated discharge plan noting still waiting for another surgery in about a week or patient will transition  from a cell grafting to a true skin graft with subsequent discharge within 3 to 5 days after that surgery at discretion of plastic surgery team.  States adjustment and nicotine treatment has significantly helped symptoms.  Aware his methadone dose will go up an additional 5 mg today  Objective: Vitals:   07/22/21 1240 07/22/21 2124  BP: 128/73 121/79  Pulse: 96 (!) 110  Resp:  17  Temp: 97.9 F (36.6 C) 98 F (36.7 C)  SpO2: 99% 100%    Intake/Output Summary (Last 24 hours) at 07/23/2021 0806 Last data filed at 07/23/2021 0412 Gross per 24 hour  Intake 2551.01 ml  Output 1700 ml  Net 851.01 ml   Filed Weights   07/10/21 2308  Weight: 73.4 kg    Exam:  Constitutional: No acute distress Respiratory: clear to auscultation bilaterally, no wheezing, no crackles. Normal respiratory effort.  RA Cardiovascular: Regular pulse, S1S2, No lower extremity edema.  Abdomen: no tenderness, no masses palpated. Bowel sounds positive. LBM 1/16 Skin: no rashes, lesions, ulcers. No induration.  Does have wound VAC over upper right arm over surgical wound Neurologic: Sleeping but at baseline CN 2-12 grossly intact. Sensation intact, Strength 5/5 x all 4 extremities.  Psychiatric: Awake and oriented x3.  Sitting on side of bed getting ready to eat postoperative meal   Assessment/Plan: Acute problems: Right upper arm cellulitis/neck fasciitis/acute osteomyelitis and patient with history of IVDU/s/p ACELL graft placement 1/13 -Superficial wound culture positive for moderate strep group F with intraoperative surgical culture from 1/5 with moderate strep group F; blood cultures negative -Orthopedic surgery, infectious disease and plastic surgery following. -IV Vanco, Zosyn and clinda>> IV Zosyn>> IV Unasyn until 07/26/2021 then p.o. Augmentin until 08/23/2021 -On methadone -continue IV Dilaudid for severe breakthrough  pain and oxycodone for moderate breakthrough pain -TOC discussed with surgeon and  plans are to proceed with skin graft in about 1 week with short-term inpatient application of wound VAC to be followed by traditional wound care upon discharge.  Given his substance abuse history he is not eligible for home health services   Polysubstance use/IVDU including heroin, cocaine, fentanyl, meth and tobacco -UDS positive for cocaine on admission.  HIV nonreactive.  Hepatitis C antibody positive. -Continue nicotine gum but adjust schedule to every 4 hours while awake and add a low-dose nicotine patch 7 mg/day -Hep B vaccine -Outpatient follow-up with [facility] upon discharge. -Methadone dose increased to 35 mg on 1/15.  No further dose adjustments until 72 hours after last dose adjustment.  We will increase to 40 mg beginning on a.m. 1/18   Acute blood loss anemia superimposed on chronic iron deficiency anemia -Hgb 15.5 in 2018.   -s/p 5 units of PRBCs this admission -Current hemoglobin 10.7   Uncontrolled hypertension:  Not on medication at home. -Continue hydralazine as needed   Hyponatremia:  Resolved   Hypokalemia:  Resolved   History of anxiety/depression:  Stable. -On Vistaril at home -frequency increased this hospitalization to every 6 hours as needed -Anxiety has improved with the addition of methadone as above   Leukocytosis/bandemia/thrombocytosis:  Likely reactive due to #1. -Continue monitoring   Hepatitis C antibody positive -Follow quantitative RNA      Data Reviewed: Basic Metabolic Panel: Recent Labs  Lab 07/18/21 0601 07/19/21 0038  NA 138 137  K 4.1 4.2  CL 105 101  CO2 25 27  GLUCOSE 95 128*  BUN 12 19  CREATININE 0.48* 0.73  CALCIUM 8.8* 8.4*   Liver Function Tests: Recent Labs  Lab 07/19/21 0038  AST 39  ALT 29  ALKPHOS 70  BILITOT 0.3  PROT 6.0*  ALBUMIN 2.6*    CBC: Recent Labs  Lab 07/18/21 0601 07/19/21 0038  WBC 11.5* 12.9*  NEUTROABS  --  6.1  HGB 10.7* 10.3*  HCT 34.8* 33.4*  MCV 84.9 85.4  PLT 516* 525*    Cardiac Enzymes: No results for input(s): CKTOTAL, CKMB, CKMBINDEX, TROPONINI in the last 168 hours.   No results found for this or any previous visit (from the past 240 hour(s)).      Scheduled Meds:  docusate sodium  100 mg Oral BID   feeding supplement  237 mL Oral BID BM   gabapentin  400 mg Oral TID   methadone  40 mg Oral Daily   mirtazapine  30 mg Oral QHS   multivitamin with minerals  1 tablet Oral Daily   nicotine  7 mg Transdermal Daily   nicotine polacrilex  4 mg Oral Q4H while awake   sertraline  200 mg Oral Daily   traZODone  100 mg Oral QHS   Continuous Infusions:  ampicillin-sulbactam (UNASYN) IV 3 g (07/23/21 0236)    Principal Problem:   Sepsis ([place]) Active Problems:   Heroin use disorder, moderate, dependence (HCC)   Anemia   Cellulitis   Hypokalemia   Hyponatremia   Tobacco abuse   Protein-calorie malnutrition, severe   Malnutrition of moderate degree   Abscess of right arm   Consultants: Orthopedic team Plastic surgery Infectious disease  Procedures: Intraoperative IND with wound VAC placement of RUE on 1/5 and 1/8 Echocardiogram  Antibiotics: Zosyn 1/4 through 1/9 Vancomycin 1/4 through 1/7 Cefazolin x1 dose 1/5 Clindamycin 1/5 through 1/7 Unasyn 1/9 >>   Time spent:  15 minutes    Erin Hearing ANP  [facility] 7 am - 330 pm/M-F for direct patient care and secure chat Please refer to Amion for contact info 14  days

## 2021-07-24 DIAGNOSIS — T148XXA Other injury of unspecified body region, initial encounter: Secondary | ICD-10-CM

## 2021-07-24 DIAGNOSIS — L02413 Cutaneous abscess of right upper limb: Secondary | ICD-10-CM | POA: Diagnosis not present

## 2021-07-24 LAB — CBC WITH DIFFERENTIAL/PLATELET
Abs Immature Granulocytes: 0.05 10*3/uL (ref 0.00–0.07)
Basophils Absolute: 0.2 10*3/uL — ABNORMAL HIGH (ref 0.0–0.1)
Basophils Relative: 2 %
Eosinophils Absolute: 0.2 10*3/uL (ref 0.0–0.5)
Eosinophils Relative: 2 %
HCT: 37.3 % — ABNORMAL LOW (ref 39.0–52.0)
Hemoglobin: 11.3 g/dL — ABNORMAL LOW (ref 13.0–17.0)
Immature Granulocytes: 1 %
Lymphocytes Relative: 45 %
Lymphs Abs: 4.7 10*3/uL — ABNORMAL HIGH (ref 0.7–4.0)
MCH: 26.2 pg (ref 26.0–34.0)
MCHC: 30.3 g/dL (ref 30.0–36.0)
MCV: 86.5 fL (ref 80.0–100.0)
Monocytes Absolute: 0.9 10*3/uL (ref 0.1–1.0)
Monocytes Relative: 9 %
Neutro Abs: 4.1 10*3/uL (ref 1.7–7.7)
Neutrophils Relative %: 41 %
Platelets: 505 10*3/uL — ABNORMAL HIGH (ref 150–400)
RBC: 4.31 MIL/uL (ref 4.22–5.81)
RDW: 21.2 % — ABNORMAL HIGH (ref 11.5–15.5)
WBC: 10.1 10*3/uL (ref 4.0–10.5)
nRBC: 0 % (ref 0.0–0.2)

## 2021-07-24 LAB — COMPREHENSIVE METABOLIC PANEL
ALT: 48 U/L — ABNORMAL HIGH (ref 0–44)
AST: 61 U/L — ABNORMAL HIGH (ref 15–41)
Albumin: 3.2 g/dL — ABNORMAL LOW (ref 3.5–5.0)
Alkaline Phosphatase: 124 U/L (ref 38–126)
Anion gap: 11 (ref 5–15)
BUN: 18 mg/dL (ref 6–20)
CO2: 24 mmol/L (ref 22–32)
Calcium: 9.2 mg/dL (ref 8.9–10.3)
Chloride: 100 mmol/L (ref 98–111)
Creatinine, Ser: 0.51 mg/dL — ABNORMAL LOW (ref 0.61–1.24)
GFR, Estimated: >60 mL/min (ref >60)
Glucose, Bld: 115 mg/dL — ABNORMAL HIGH (ref 70–99)
Potassium: 4.3 mmol/L (ref 3.5–5.1)
Sodium: 135 mmol/L (ref 135–145)
Total Bilirubin: 0.4 mg/dL (ref 0.3–1.2)
Total Protein: 7.3 g/dL (ref 6.5–8.1)

## 2021-07-24 MED ORDER — IBUPROFEN 200 MG PO TABS
400.0000 mg | ORAL_TABLET | Freq: Three times a day (TID) | ORAL | Status: AC
  Administered 2021-07-24 – 2021-07-29 (×14): 400 mg via ORAL
  Filled 2021-07-24 (×14): qty 2

## 2021-07-24 NOTE — Plan of Care (Signed)
°  Problem: Nutritional: °Goal: Nutritional status of the infant will improve as evidenced by minimal weight loss and appropriate weight gain for gestational age °Outcome: Progressing °Goal: Ability to maintain a balanced intake and output will improve °Outcome: Progressing °  °Problem: Clinical Measurements: °Goal: Ability to maintain clinical measurements within normal limits will improve °Outcome: Progressing °  °Problem: Skin Integrity: °Goal: Risk for impaired skin integrity will decrease °Outcome: Progressing °Goal: Demonstrates signs of wound healing without infection °Outcome: Progressing °  °Problem: Education: °Goal: Knowledge of General Education information will improve °Description: Including pain rating scale, medication(s)/side effects and non-pharmacologic comfort measures °Outcome: Progressing °  °Problem: Health Behavior/Discharge Planning: °Goal: Ability to manage health-related needs will improve °Outcome: Progressing °  °Problem: Clinical Measurements: °Goal: Ability to maintain clinical measurements within normal limits will improve °Outcome: Progressing °Goal: Will remain free from infection °Outcome: Progressing °Goal: Diagnostic test results will improve °Outcome: Progressing °Goal: Respiratory complications will improve °Outcome: Progressing °Goal: Cardiovascular complication will be avoided °Outcome: Progressing °  °Problem: Activity: °Goal: Risk for activity intolerance will decrease °Outcome: Progressing °  °Problem: Nutrition: °Goal: Adequate nutrition will be maintained °Outcome: Progressing °  °Problem: Coping: °Goal: Level of anxiety will decrease °Outcome: Progressing °  °Problem: Elimination: °Goal: Will not experience complications related to bowel motility °Outcome: Progressing °Goal: Will not experience complications related to urinary retention °Outcome: Progressing °  °Problem: Pain Managment: °Goal: General experience of comfort will improve °Outcome: Progressing °  °Problem:  Safety: °Goal: Ability to remain free from injury will improve °Outcome: Progressing °  °Problem: Skin Integrity: °Goal: Risk for impaired skin integrity will decrease °Outcome: Progressing °  °

## 2021-07-24 NOTE — Progress Notes (Addendum)
[facility] PROGRESS NOTE  Justin Fisher KVQ:[id number] DOB: [birth year] DOA: 07/09/2021 PCP: Patient, No Pcp Per (Inactive)  Status: Remains inpatient appropriate because:  Unsafe discharge plan noting requires IV antibiotics for current medical condition history of IVD use not a candidate for home IV therapy; also requiring frequent reevaluation by surgical team regarding wound on upper right arm that is also requiring wound VAC therapy  Barriers to discharge: Social:Known history of active IV drug abuse prior to admission  Clinical: Continues to require IV antibiotics and regular wound therapy for wound upper extremity.  May require additional surgical procedures including plastic surgery  Level of care:  Med-Surg   Code Status: Full Family Communication: Patient only DVT prophylaxis: SCDs COVID vaccination status: Unknown    HPI: 36 year old M with PMH of IVDU (heroin, fentanyl and meth), anxiety, HLD, suicidal ideation and tobacco use disorder presenting to ED on 07/09/2021 with progressive severe right upper extremity pain, swelling, oozing brown and foul-smelling liquid after injecting IV drugs using a needle with blood in it, and admitted for sepsis due to right upper arm cellulitis/neck fasciitis/abscess/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 orthopedic surgery and ID consulted.  Initially started on vancomycin, Zosyn and clindamycin.  He underwent I&D with VAC placement on 1/5 and 1/8.  Blood cultures NGTD.  Surgical culture with group F Streptococcus.  Antibiotics de-escalated to IV Zosyn.  ID, orthopedic surgery and plastic surgery following.   Hospital course complicated by acute blood loss anemia related to surgery.  He had about 2 L blood transfused from 1/8-1/9.  Subjective: Patient alert.  Only new complaint is of muscle type pain/strain on right inner thigh-worse with ambulation and movement.  Has had similar symptoms before that spontaneously  resolved.  Objective: Vitals:   07/23/21 2056 07/24/21 0827  BP: 116/60 116/71  Pulse: 95 96  Resp: 17 17  Temp: 98 F (36.7 C) 98.4 F (36.9 C)  SpO2: 95% 97%    Intake/Output Summary (Last 24 hours) at 07/24/2021 1227 Last data filed at 07/24/2021 0900 Gross per 24 hour  Intake 840 ml  Output 1850 ml  Net -1010 ml   Filed Weights   07/10/21 2308  Weight: 73.4 kg    Exam:  Constitutional: No acute distress Respiratory: clear to auscultation bilaterally, no wheezing, no crackles. Normal respiratory effort.  RA Cardiovascular: Regular pulse, S1S2, No lower extremity edema.  Abdomen: no tenderness, no masses palpated. Bowel sounds positive. LBM 1/17 Skin: no rashes, lesions, ulcers. No induration.  Does have wound VAC over upper right arm over surgical wound Neurologic: CN 2-12 grossly intact. Sensation intact, Strength 5/5 x all 4 extremities.  Psychiatric: Awake and oriented x3.  Sitting on side of bed getting ready to eat postoperative meal   Assessment/Plan: Acute problems: Right upper arm cellulitis/neck fasciitis/acute osteomyelitis and patient with history of IVDU/s/p ACELL graft placement 1/13 -Superficial wound culture positive for moderate strep group F with intraoperative surgical culture from 1/5 with moderate strep group F; blood cultures negative -Orthopedic surgery, infectious disease and plastic surgery following. -IV Vanco, Zosyn and clinda>> IV Zosyn>> IV Unasyn until 07/26/2021 then p.o. Augmentin until 08/23/2021 -On methadone -continue IV Dilaudid for severe breakthrough pain and oxycodone for moderate breakthrough pain -TOC discussed with surgeon and plans are to proceed with skin graft in about 1 week with short-term inpatient application of wound VAC to be followed by traditional wound care upon discharge.  Given his substance abuse history he is not eligible for home health services  Polysubstance use/IVDU including heroin, cocaine, fentanyl, meth and  tobacco -UDS positive for cocaine on admission.  HIV nonreactive.  Hepatitis C antibody positive. -Continue nicotine gum but adjust schedule to every 4 hours while awake and add a low-dose nicotine patch 7 mg/day -Hep B vaccine -Outpatient follow-up with [facility] upon discharge. -Methadone dose increased to 35 mg on 1/15.  No further dose adjustments until 72 hours after last dose adjustment.  We will increase to 40 mg beginning on a.m. 1/18  Mild transaminitis -Nonobstructive pattern; AST 61 and ALT 48 with previous readings -Likely from Unasyn which should be completed by next week -Follow LFTs daily -We will hold off on making any medication changes at this point  Muscle strain -Begin low-dose ibuprofen 400 mg 3 times daily x5 days -CK 1 week ago-repeat again in a.m.   Acute blood loss anemia superimposed on chronic iron deficiency anemia -Hgb 15.5 in 2018.   -s/p 5 units of PRBCs this admission -Current hemoglobin 10.7   Uncontrolled hypertension:  Not on medication at home. -Continue hydralazine as needed   Hyponatremia:  Resolved   Hypokalemia:  Resolved   History of anxiety/depression:  Stable. -On Vistaril at home -frequency increased this hospitalization to every 6 hours as needed -Anxiety has improved with the addition of methadone as above   Leukocytosis/bandemia/thrombocytosis:  Likely reactive due to #1. -Continue monitoring   Hepatitis C antibody positive -Follow quantitative RNA      Data Reviewed: Basic Metabolic Panel: Recent Labs  Lab 07/18/21 0601 07/19/21 0038 07/24/21 0547  NA 138 137 135  K 4.1 4.2 4.3  CL 105 101 100  CO2 25 27 24   GLUCOSE 95 128* 115*  BUN 12 19 18   CREATININE 0.48* 0.73 0.51*  CALCIUM 8.8* 8.4* 9.2   Liver Function Tests: Recent Labs  Lab 07/19/21 0038 07/24/21 0547  AST 39 61*  ALT 29 48*  ALKPHOS 70 124  BILITOT 0.3 0.4  PROT 6.0* 7.3  ALBUMIN 2.6* 3.2*    CBC: Recent Labs  Lab 07/18/21 0601  07/19/21 0038 07/24/21 0547  WBC 11.5* 12.9* 10.1  NEUTROABS  --  6.1 4.1  HGB 10.7* 10.3* 11.3*  HCT 34.8* 33.4* 37.3*  MCV 84.9 85.4 86.5  PLT 516* 525* 505*   Cardiac Enzymes: No results for input(s): CKTOTAL, CKMB, CKMBINDEX, TROPONINI in the last 168 hours.   No results found for this or any previous visit (from the past 240 hour(s)).      Scheduled Meds:  docusate sodium  100 mg Oral BID   feeding supplement  237 mL Oral BID BM   gabapentin  400 mg Oral TID   ibuprofen  400 mg Oral TID WC   methadone  40 mg Oral Daily   mirtazapine  30 mg Oral QHS   multivitamin with minerals  1 tablet Oral Daily   nicotine  7 mg Transdermal Daily   nicotine polacrilex  4 mg Oral Q4H while awake   sertraline  200 mg Oral Daily   traZODone  100 mg Oral QHS   Continuous Infusions:  ampicillin-sulbactam (UNASYN) IV 3 g (07/24/21 [id number])    Principal Problem:   Sepsis (HCC) Active Problems:   Heroin use disorder, moderate, dependence (HCC)   Anemia   Cellulitis   Hypokalemia   Hyponatremia   Tobacco abuse   Protein-calorie malnutrition, severe   Malnutrition of moderate degree   Abscess of right arm   Consultants: Orthopedic team Plastic surgery Infectious disease  Procedures:  Intraoperative IND with wound VAC placement of RUE on 1/5 and 1/8 Echocardiogram  Antibiotics: Zosyn 1/4 through 1/9 Vancomycin 1/4 through 1/7 Cefazolin x1 dose 1/5 Clindamycin 1/5 through 1/7 Unasyn 1/9 >>   Time spent: 25 minutes    Junious Silk ANP  [facility] 7 am - 330 pm/M-F for direct patient care and secure chat Please refer to Amion for contact info 15  days

## 2021-07-25 ENCOUNTER — Encounter (HOSPITAL_COMMUNITY): Payer: Self-pay | Admitting: Anesthesiology

## 2021-07-25 ENCOUNTER — Encounter (HOSPITAL_COMMUNITY): Payer: Self-pay | Admitting: Internal Medicine

## 2021-07-25 ENCOUNTER — Encounter (HOSPITAL_COMMUNITY)
Admission: EM | Disposition: A | Discharge status: Home / Self Care | Payer: Self-pay | Source: Home / Self Care | Attending: Internal Medicine

## 2021-07-25 DIAGNOSIS — T148XXA Other injury of unspecified body region, initial encounter: Secondary | ICD-10-CM | POA: Diagnosis not present

## 2021-07-25 DIAGNOSIS — F1120 Opioid dependence, uncomplicated: Secondary | ICD-10-CM | POA: Diagnosis not present

## 2021-07-25 DIAGNOSIS — L02413 Cutaneous abscess of right upper limb: Secondary | ICD-10-CM | POA: Diagnosis not present

## 2021-07-25 LAB — COMPREHENSIVE METABOLIC PANEL
ALT: 55 U/L — ABNORMAL HIGH (ref 0–44)
AST: 74 U/L — ABNORMAL HIGH (ref 15–41)
Albumin: 3.1 g/dL — ABNORMAL LOW (ref 3.5–5.0)
Alkaline Phosphatase: 133 U/L — ABNORMAL HIGH (ref 38–126)
Anion gap: 11 (ref 5–15)
BUN: 21 mg/dL — ABNORMAL HIGH (ref 6–20)
CO2: 23 mmol/L (ref 22–32)
Calcium: 9.2 mg/dL (ref 8.9–10.3)
Chloride: 104 mmol/L (ref 98–111)
Creatinine, Ser: 0.55 mg/dL — ABNORMAL LOW (ref 0.61–1.24)
GFR, Estimated: >60 mL/min (ref >60)
Glucose, Bld: 101 mg/dL — ABNORMAL HIGH (ref 70–99)
Potassium: 5 mmol/L (ref 3.5–5.1)
Sodium: 138 mmol/L (ref 135–145)
Total Bilirubin: 0.4 mg/dL (ref 0.3–1.2)
Total Protein: 7 g/dL (ref 6.5–8.1)

## 2021-07-25 LAB — CK: Total CK: 28 U/L — ABNORMAL LOW (ref 49–397)

## 2021-07-25 SURGERY — APPLICATION, WOUND VAC
Anesthesia: General | Site: Arm Upper | Laterality: Right

## 2021-07-25 MED ORDER — CHLORHEXIDINE GLUCONATE CLOTH 2 % EX PADS: 6.0000 | MEDICATED_PAD | Freq: Once | CUTANEOUS | Status: DC

## 2021-07-25 MED ORDER — AMOXICILLIN-POT CLAVULANATE 875-125 MG PO TABS
1.0000 | ORAL_TABLET | Freq: Two times a day (BID) | ORAL | Status: DC
  Administered 2021-07-27 – 2021-08-05 (×19): 1 via ORAL
  Filled 2021-07-25 (×19): qty 1

## 2021-07-25 MED ORDER — METHADONE HCL 10 MG PO TABS
45.0000 mg | ORAL_TABLET | Freq: Every day | ORAL | Status: DC
  Administered 2021-07-26 – 2021-07-29 (×4): 45 mg via ORAL
  Filled 2021-07-25 (×4): qty 5

## 2021-07-25 MED ORDER — CEFAZOLIN SODIUM-DEXTROSE 2-4 GM/100ML-% IV SOLN: 2.0000 g | INTRAVENOUS | Status: AC

## 2021-07-25 MED ORDER — METHADONE HCL 10 MG PO TABS
40.0000 mg | ORAL_TABLET | Freq: Once | ORAL | Status: AC
  Administered 2021-07-25: 40 mg via ORAL
  Filled 2021-07-25: qty 4

## 2021-07-25 NOTE — Progress Notes (Signed)
[facility] PROGRESS NOTE  Demico Ploch BSW:[id number] DOB: 08/03/1985 DOA: 07/09/2021 PCP: Justin Fisher, No Pcp Per (Inactive)  Status: Remains inpatient appropriate because:  Unsafe discharge plan noting requires IV antibiotics for current medical condition history of IVD use not a candidate for home IV therapy; also requiring frequent reevaluation by surgical team regarding wound on upper right arm that is also requiring wound VAC therapy  Barriers to discharge: Social:Known history of active IV drug abuse prior to admission  Clinical: Continues to require IV antibiotics and regular wound therapy for wound upper extremity.  May require additional surgical procedures including plastic surgery  Level of care:  Med-Surg   Code Status: Full Family Communication: Justin Fisher only DVT prophylaxis: SCDs COVID vaccination status: Unknown    HPI: 36 year old M with PMH of IVDU (heroin, fentanyl and meth), anxiety, HLD, suicidal ideation and tobacco use disorder presenting to ED on 07/09/2021 with progressive severe right upper extremity pain, swelling, oozing brown and foul-smelling liquid after injecting IV drugs using a needle with blood in it, and admitted for sepsis due to right upper arm cellulitis/neck fasciitis/abscess/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 orthopedic surgery and ID consulted.  Initially started on vancomycin, Zosyn and clindamycin.  He underwent I&D with VAC placement on 1/5 and 1/8.  Blood cultures NGTD.  Surgical culture with group F Streptococcus.  Antibiotics de-escalated to IV Zosyn.  ID, orthopedic surgery and plastic surgery following.   Hospital course complicated by acute blood loss anemia related to surgery.  He had about 2 L blood transfused from 1/8-1/9.  Subjective: States musculoskeletal pain has significantly improved with the addition of ibuprofen.  Aware that methadone dose to be increased on 1/21  Objective: Vitals:   07/24/21 0827 07/24/21  2144  BP: 116/71 121/67  Pulse: 96 92  Resp: 17 18  Temp: 98.4 F (36.9 C) 98.3 F (36.8 C)  SpO2: 97% 99%    Intake/Output Summary (Last 24 hours) at 07/25/2021 0802 Last data filed at 07/25/2021 0604 Gross per 24 hour  Intake 4042.58 ml  Output 1300 ml  Net 2742.58 ml   Filed Weights   07/10/21 2308  Weight: 73.4 kg    Exam:  Constitutional: No acute distress Respiratory: clear to auscultation bilaterally, no wheezing, no crackles. Normal respiratory effort.  RA Cardiovascular: Regular pulse, S1S2, No lower extremity edema.  Abdomen: no tenderness, no masses palpated. Bowel sounds positive. LBM 1/17 Skin: Wound VAC over upper right arm over surgical wound Neurologic: CN 2-12 grossly intact. Sensation intact, Strength 5/5 x all 4 extremities.  Psychiatric: Awake and oriented x3.    Assessment/Plan: Acute problems: Right upper arm cellulitis/neck fasciitis/acute osteomyelitis and Justin Fisher with history of IVDU/s/p ACELL graft placement 1/13 -Superficial wound culture positive for moderate strep group F with intraoperative surgical culture from 1/5 with moderate strep group F; blood cultures negative-as of 1/20 WBC has normalized -Orthopedic surgery, infectious disease and plastic surgery following. -IV Vanco, Zosyn and clinda>> IV Zosyn>> IV Unasyn until 07/26/2021 then p.o. Augmentin until 08/23/2021 -On methadone -continue IV Dilaudid for severe breakthrough pain and oxycodone for moderate breakthrough pain -TOC discussed with surgeon and plans are to proceed with skin graft in about 1 week with short-term inpatient application of wound VAC to be followed by traditional wound care upon discharge.  Given his substance abuse history he is not eligible for home health services   Polysubstance use/IVDU including heroin, cocaine, fentanyl, meth and tobacco -UDS positive for cocaine on admission.  HIV nonreactive.  Hepatitis  C antibody positive. -Continue nicotine gum but adjust  schedule to every 4 hours while awake and add a low-dose nicotine patch 7 mg/day -Hep B vaccine -Outpatient follow-up with [facility] upon discharge. -Methadone dose increased to 35 mg on 1/15.  No further dose adjustments until 72 hours after last dose adjustment.  We will increase to 45 mg beginning on a.m. 1/21  Mild transaminitis -Nonobstructive pattern: AST 39> 61 > 74 ALT 29 > 48 > 55 -Likely from Unasyn which should be completed by next week -Follow LFTs daily -We will hold off on making any medication changes at this point  Muscle strain -Begin low-dose ibuprofen 400 mg 3 times daily x5 days -CK 28   Acute blood loss anemia superimposed on chronic iron deficiency anemia -Hgb 15.5 in 2018.   -s/p 5 units of PRBCs this admission -Current hemoglobin 10.7   Uncontrolled hypertension:  Not on medication at home. -Continue hydralazine as needed   Hyponatremia:  Resolved   Hypokalemia:  Resolved   History of anxiety/depression:  Stable. -On Vistaril at home -frequency increased this hospitalization to every 6 hours as needed -Anxiety has improved with the addition of methadone as above   Leukocytosis/bandemia/thrombocytosis:  Likely reactive due to #1. -Continue monitoring   Hepatitis C antibody positive -Follow quantitative RNA      Data Reviewed: Basic Metabolic Panel: Recent Labs  Lab 07/19/21 0038 07/24/21 0547  NA 137 135  K 4.2 4.3  CL 101 100  CO2 27 24  GLUCOSE 128* 115*  BUN 19 18  CREATININE 0.73 0.51*  CALCIUM 8.4* 9.2   Liver Function Tests: Recent Labs  Lab 07/19/21 0038 07/24/21 0547  AST 39 61*  ALT 29 48*  ALKPHOS 70 124  BILITOT 0.3 0.4  PROT 6.0* 7.3  ALBUMIN 2.6* 3.2*    CBC: Recent Labs  Lab 07/19/21 0038 07/24/21 0547  WBC 12.9* 10.1  NEUTROABS 6.1 4.1  HGB 10.3* 11.3*  HCT 33.4* 37.3*  MCV 85.4 86.5  PLT 525* 505*   Cardiac Enzymes: No results for input(s): CKTOTAL, CKMB, CKMBINDEX, TROPONINI in the last 168  hours.   No results found for this or any previous visit (from the past 240 hour(s)).      Scheduled Meds:  docusate sodium  100 mg Oral BID   feeding supplement  237 mL Oral BID BM   gabapentin  400 mg Oral TID   ibuprofen  400 mg Oral TID WC   methadone  40 mg Oral Once   [START ON 07/26/2021] methadone  45 mg Oral Daily   mirtazapine  30 mg Oral QHS   multivitamin with minerals  1 tablet Oral Daily   nicotine  7 mg Transdermal Daily   nicotine polacrilex  4 mg Oral Q4H while awake   sertraline  200 mg Oral Daily   traZODone  100 mg Oral QHS   Continuous Infusions:  ampicillin-sulbactam (UNASYN) IV 3 g (07/25/21 0138)    Principal Problem:   Sepsis (HCC) Active Problems:   Heroin use disorder, moderate, dependence (HCC)   Anemia   Cellulitis   Hypokalemia   Hyponatremia   Tobacco abuse   Protein-calorie malnutrition, severe   Malnutrition of moderate degree   Abscess of right arm   Muscle strain   Consultants: Orthopedic team Plastic surgery Infectious disease  Procedures: Intraoperative IND with wound VAC placement of RUE on 1/5 and 1/8 Echocardiogram  Antibiotics: Zosyn 1/4 through 1/9 Vancomycin 1/4 through 1/7 Cefazolin x1 dose 1/5  Clindamycin 1/5 through 1/7 Unasyn 1/9 >>   Time spent: 25 minutes    Junious Silk ANP  [facility] 7 am - 330 pm/M-F for direct Justin Fisher care and secure chat Please refer to Amion for contact info 16  days

## 2021-07-26 NOTE — Progress Notes (Signed)
Patient seen and examined, no changes from detailed note by Junious Silk, NP yesterday -Briefly 35/M with history of IVDU, depression, anxiety, chronic pain, tobacco abuse was admitted with severe sepsis, right humeral osteomyelitis, upper arm cellulitis abscess and neck fasciitis. -Seen by orthopedic surgery, plastic surgery and infectious disease in consultation -Underwent I&D with VAC placement on 1/5 and 1/8 -OR cultures grew group F strep -Treated with IV Zosyn initially and then transitioned to IV Unasyn, will complete course today 1/21 -Plan for repeat OR next week -Start Augmentin tomorrow until 2/18 -Continue current dose of methadone  Zannie Cove, MD

## 2021-07-26 NOTE — Plan of Care (Signed)
°  Problem: Nutritional: Goal: Nutritional status of the infant will improve as evidenced by minimal weight loss and appropriate weight gain for gestational age Outcome: Progressing   Problem: Clinical Measurements: Goal: Ability to maintain clinical measurements within normal limits will improve Outcome: Progressing   Problem: Safety: Goal: Ability to remain free from injury will improve Outcome: Progressing

## 2021-07-27 NOTE — Plan of Care (Signed)
  Problem: Education: Goal: Knowledge of General Education information will improve Description: Including pain rating scale, medication(s)/side effects and non-pharmacologic comfort measures Outcome: Progressing   Problem: Activity: Goal: Risk for activity intolerance will decrease Outcome: Progressing   Problem: Nutrition: Goal: Adequate nutrition will be maintained Outcome: Progressing   

## 2021-07-27 NOTE — Plan of Care (Signed)
°  Problem: Education: Goal: Knowledge of General Education information will improve Description: Including pain rating scale, medication(s)/side effects and non-pharmacologic comfort measures Outcome: Progressing   Problem: Health Behavior/Discharge Planning: Goal: Ability to manage health-related needs will improve Outcome: Progressing   Problem: Clinical Measurements: Goal: Ability to maintain clinical measurements within normal limits will improve Outcome: Progressing Goal: Will remain free from infection Outcome: Progressing Goal: Diagnostic test results will improve Outcome: Progressing Goal: Respiratory complications will improve Outcome: Progressing Goal: Cardiovascular complication will be avoided Outcome: Progressing   Problem: Activity: Goal: Risk for activity intolerance will decrease Outcome: Progressing   Problem: Nutrition: Goal: Adequate nutrition will be maintained Outcome: Progressing   Problem: Coping: Goal: Level of anxiety will decrease Outcome: Progressing   Problem: Elimination: Goal: Will not experience complications related to bowel motility Outcome: Progressing Goal: Will not experience complications related to urinary retention Outcome: Progressing   Problem: Pain Managment: Goal: General experience of comfort will improve Outcome: Progressing   Problem: Safety: Goal: Ability to remain free from injury will improve Outcome: Progressing   Problem: Skin Integrity: Goal: Risk for impaired skin integrity will decrease Outcome: Progressing   Problem: Nutritional: Goal: Nutritional status of the infant will improve as evidenced by minimal weight loss and appropriate weight gain for gestational age Outcome: Progressing Goal: Ability to maintain a balanced intake and output will improve Outcome: Progressing   Problem: Clinical Measurements: Goal: Ability to maintain clinical measurements within normal limits will improve Outcome:  Progressing   Problem: Skin Integrity: Goal: Risk for impaired skin integrity will decrease Outcome: Progressing Goal: Demonstrates signs of wound healing without infection Outcome: Progressing

## 2021-07-27 NOTE — Progress Notes (Signed)
Patient seen, no changes from my note yesterday -Appears comfortable, sitting up in bed on his phone -Completed IV Unasyn course yesterday, starting Augmentin today -Continue current dose of methadone, appears very comfortable on this regimen, he tells me he has been on up to 90 Mg of methadone in the past -Plastic surgery following -Follow-up BMP in a.m., potassium has been trending up, he is on NSAIDs  Zannie Cove, MD

## 2021-07-27 NOTE — Progress Notes (Signed)
Mobility Specialist Progress Note:   07/27/21 1200  Mobility  Activity Refused mobility   Pt declined mobility at this time. States he didn't need help ambulating, and is independent with wound vac.   Addison Lank Mobility Specialist  Phone [phone]

## 2021-07-28 ENCOUNTER — Inpatient Hospital Stay: Payer: BC Managed Care – PPO | Admitting: Internal Medicine

## 2021-07-28 DIAGNOSIS — L02413 Cutaneous abscess of right upper limb: Secondary | ICD-10-CM | POA: Diagnosis not present

## 2021-07-28 DIAGNOSIS — T148XXA Other injury of unspecified body region, initial encounter: Secondary | ICD-10-CM | POA: Diagnosis not present

## 2021-07-28 NOTE — Progress Notes (Signed)
Patient refused labs this morning.

## 2021-07-28 NOTE — Progress Notes (Signed)
[facility] PROGRESS NOTE  Justin Fisher EZB:[id number] DOB: 11-13-1985 DOA: 07/09/2021 PCP: Patient, No Pcp Per (Inactive)  Status: Remains inpatient appropriate because:  Unsafe discharge plan noting requires IV antibiotics for current medical condition history of IVD use not a candidate for home IV therapy; also requiring frequent reevaluation by surgical team regarding wound on upper right arm that is also requiring wound VAC therapy  Barriers to discharge: Social:Known history of active IV drug abuse prior to admission  Clinical: Continues to require IV antibiotics and regular wound therapy for wound upper extremity.  May require additional surgical procedures including plastic surgery  Level of care:  Med-Surg   Code Status: Full Family Communication: Patient only DVT prophylaxis: SCDs COVID vaccination status: Unknown    HPI: 36 year old M with PMH of IVDU (heroin, fentanyl and meth), anxiety, HLD, suicidal ideation and tobacco use disorder presenting to ED on 07/09/2021 with progressive severe right upper extremity pain, swelling, oozing brown and foul-smelling liquid after injecting IV drugs using a needle with blood in it, and admitted for sepsis due to right upper arm cellulitis/neck fasciitis/abscess/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 orthopedic surgery and ID consulted.  Initially started on vancomycin, Zosyn and clindamycin.  He underwent I&D with VAC placement on 1/5 and 1/8.  Blood cultures NGTD.  Surgical culture with group F Streptococcus.  Antibiotics de-escalated to IV Zosyn.  ID, orthopedic surgery and plastic surgery following.   Hospital course complicated by acute blood loss anemia related to surgery.  He had about 2 L blood transfused from 1/8-1/9.  Subjective: Continues to have right leg discomfort primarily in the upper medial thigh away from bony structures and is quite tender to palpation-patient denies any pain or radiation into scrotal  area  Objective: Vitals:   07/27/21 2012 07/28/21 0727  BP: 121/68 125/73  Pulse: 96 (!) 102  Resp: 16 17  Temp: 98.3 F (36.8 C) 98.1 F (36.7 C)  SpO2: 100% 99%    Intake/Output Summary (Last 24 hours) at 07/28/2021 4935 Last data filed at 07/27/2021 1400 Gross per 24 hour  Intake 480 ml  Output --  Net 480 ml   Filed Weights   07/10/21 2308  Weight: 73.4 kg    Exam:  Constitutional: No acute distress except for recurrent right groin medial thigh pain Respiratory: clear to auscultation bilaterally, no wheezing, no crackles. Normal respiratory effort.  RA Cardiovascular: Regular pulse, S1S2, No lower extremity edema.  Abdomen: no tenderness, no masses palpated. Bowel sounds positive. LBM 1/20 Skin: Wound VAC over upper right arm over surgical wound Neurologic: CN 2-12 grossly intact. Sensation intact, Strength 5/5 x all 4 extremities.  Psychiatric: Awake and oriented x3.    Assessment/Plan: Acute problems: Right upper arm cellulitis/neck fasciitis/acute osteomyelitis and patient with history of IVDU/s/p ACELL graft placement 1/13 -Superficial wound culture positive for moderate strep group F with intraoperative surgical culture from 1/5 with moderate strep group F; blood cultures negative-as of 1/20 WBC has normalized -Orthopedic surgery, infectious disease and plastic surgery following. -IV Vanco, Zosyn and clinda>> IV Zosyn>> IV Unasyn until 07/26/2021 then p.o. Augmentin until 08/23/2021 -On methadone -continue IV Dilaudid for severe breakthrough pain and oxycodone for moderate breakthrough pain -TOC discussed with surgeon and plans are to proceed with skin graft in about 1 week with short-term inpatient application of wound VAC to be followed by traditional wound care upon discharge.  Given his substance abuse history he is not eligible for home health services -Plastics team has okayed initiation  of physical therapy to right arm   Polysubstance use/IVDU including  heroin, cocaine, fentanyl, meth and tobacco -UDS positive for cocaine on admission.  HIV nonreactive.  Hepatitis C antibody positive. -Continue nicotine gum but adjust schedule to every 4 hours while awake and add a low-dose nicotine patch 7 mg/day -Hep B vaccine -Outpatient follow-up with [facility] upon discharge. -Methadone dose increased to 35 mg on 1/15.  No further dose adjustments until 72 hours after last dose adjustment.  We will increase to 45 mg beginning on a.m. 1/21  Mild transaminitis -Nonobstructive pattern: AST 39> 61 > 74 ALT 29 > 48 > 55 -Likely from Unasyn which should be completed by next week -Follow LFTs daily -We will hold off on making any medication changes at this point  Muscle strain -Continue low-dose ibuprofen 400 mg 3 times daily x5 days -CK 28 -Add K pad on 1/23 and ask physical therapy to assist   Acute blood loss anemia superimposed on chronic iron deficiency anemia -Hgb 15.5 in 2018.   -s/p 5 units of PRBCs this admission -Current hemoglobin 10.7   Uncontrolled hypertension:  Not on medication at home. -Continue hydralazine as needed   Hyponatremia:  Resolved   Hypokalemia:  Resolved   History of anxiety/depression:  Stable. -On Vistaril at home -frequency increased this hospitalization to every 6 hours as needed -Anxiety has improved with the addition of methadone as above   Leukocytosis/bandemia/thrombocytosis:  Likely reactive due to #1. -Continue monitoring   Hepatitis C antibody positive -Follow quantitative RNA      Data Reviewed: Basic Metabolic Panel: Recent Labs  Lab 07/24/21 0547 07/25/21 0717  NA 135 138  K 4.3 5.0  CL 100 104  CO2 24 23  GLUCOSE 115* 101*  BUN 18 21*  CREATININE 0.51* 0.55*  CALCIUM 9.2 9.2   Liver Function Tests: Recent Labs  Lab 07/24/21 0547 07/25/21 0717  AST 61* 74*  ALT 48* 55*  ALKPHOS 124 133*  BILITOT 0.4 0.4  PROT 7.3 7.0  ALBUMIN 3.2* 3.1*    CBC: Recent Labs  Lab  07/24/21 0547  WBC 10.1  NEUTROABS 4.1  HGB 11.3*  HCT 37.3*  MCV 86.5  PLT 505*   Cardiac Enzymes: Recent Labs  Lab 07/25/21 0717  CKTOTAL 28*     No results found for this or any previous visit (from the past 240 hour(s)).      Scheduled Meds:  amoxicillin-clavulanate  1 tablet Oral Q12H   Chlorhexidine Gluconate Cloth  6 each Topical Once   And   Chlorhexidine Gluconate Cloth  6 each Topical Once   docusate sodium  100 mg Oral BID   feeding supplement  237 mL Oral BID BM   gabapentin  400 mg Oral TID   ibuprofen  400 mg Oral TID WC   methadone  45 mg Oral Daily   mirtazapine  30 mg Oral QHS   multivitamin with minerals  1 tablet Oral Daily   nicotine  7 mg Transdermal Daily   nicotine polacrilex  4 mg Oral Q4H while awake   sertraline  200 mg Oral Daily   traZODone  100 mg Oral QHS   Continuous Infusions:    Principal Problem:   Sepsis (HCC) Active Problems:   Heroin use disorder, moderate, dependence (HCC)   Anemia   Cellulitis   Hypokalemia   Hyponatremia   Tobacco abuse   Protein-calorie malnutrition, severe   Malnutrition of moderate degree   Abscess of right arm  Muscle strain   Consultants: Orthopedic team Plastic surgery Infectious disease  Procedures: Intraoperative IND with wound VAC placement of RUE on 1/5 and 1/8 Echocardiogram  Antibiotics: Zosyn 1/4 through 1/9 Vancomycin 1/4 through 1/7 Cefazolin x1 dose 1/5 Clindamycin 1/5 through 1/7 Unasyn 1/9 >>   Time spent: 25 minutes    Junious SilkAllison Rupa Lagan ANP  [facility] 7 am - 330 pm/M-F for direct patient care and secure chat Please refer to Amion for contact info 19  days

## 2021-07-29 ENCOUNTER — Encounter (HOSPITAL_COMMUNITY)
Admission: EM | Disposition: A | Discharge status: Home / Self Care | Payer: Self-pay | Source: Home / Self Care | Attending: Internal Medicine

## 2021-07-29 ENCOUNTER — Inpatient Hospital Stay (HOSPITAL_COMMUNITY): Payer: BC Managed Care – PPO | Admitting: Certified Registered Nurse Anesthetist

## 2021-07-29 ENCOUNTER — Encounter (HOSPITAL_COMMUNITY): Payer: Self-pay | Admitting: Internal Medicine

## 2021-07-29 DIAGNOSIS — F1120 Opioid dependence, uncomplicated: Secondary | ICD-10-CM | POA: Diagnosis not present

## 2021-07-29 DIAGNOSIS — L02413 Cutaneous abscess of right upper limb: Secondary | ICD-10-CM | POA: Diagnosis not present

## 2021-07-29 DIAGNOSIS — L03113 Cellulitis of right upper limb: Secondary | ICD-10-CM

## 2021-07-29 DIAGNOSIS — S41101A Unspecified open wound of right upper arm, initial encounter: Secondary | ICD-10-CM

## 2021-07-29 DIAGNOSIS — T148XXA Other injury of unspecified body region, initial encounter: Secondary | ICD-10-CM | POA: Diagnosis not present

## 2021-07-29 HISTORY — PX: I & D EXTREMITY: SHX5045

## 2021-07-29 HISTORY — PX: SKIN SPLIT GRAFT: SHX444

## 2021-07-29 LAB — COMPREHENSIVE METABOLIC PANEL
ALT: 72 U/L — ABNORMAL HIGH (ref 0–44)
AST: 85 U/L — ABNORMAL HIGH (ref 15–41)
Albumin: 3.5 g/dL (ref 3.5–5.0)
Alkaline Phosphatase: 109 U/L (ref 38–126)
Anion gap: 11 (ref 5–15)
BUN: 14 mg/dL (ref 6–20)
CO2: 23 mmol/L (ref 22–32)
Calcium: 9.3 mg/dL (ref 8.9–10.3)
Chloride: 101 mmol/L (ref 98–111)
Creatinine, Ser: 0.61 mg/dL (ref 0.61–1.24)
GFR, Estimated: >60 mL/min (ref >60)
Glucose, Bld: 102 mg/dL — ABNORMAL HIGH (ref 70–99)
Potassium: 6.1 mmol/L — ABNORMAL HIGH (ref 3.5–5.1)
Sodium: 135 mmol/L (ref 135–145)
Total Bilirubin: 1.2 mg/dL (ref 0.3–1.2)
Total Protein: 8.1 g/dL (ref 6.5–8.1)

## 2021-07-29 SURGERY — APPLICATION, GRAFT, SKIN, SPLIT-THICKNESS
Anesthesia: General | Site: Arm Upper | Laterality: Right

## 2021-07-29 MED ORDER — 0.9 % SODIUM CHLORIDE (POUR BTL) OPTIME
TOPICAL | Status: DC | PRN
  Administered 2021-07-29: 16:00:00 1000 mL

## 2021-07-29 MED ORDER — LIDOCAINE 2% (20 MG/ML) 5 ML SYRINGE
INTRAMUSCULAR | Status: DC | PRN
  Administered 2021-07-29: 30 mg via INTRAVENOUS

## 2021-07-29 MED ORDER — ONDANSETRON HCL 4 MG/2ML IJ SOLN
INTRAMUSCULAR | Status: AC
  Filled 2021-07-29: qty 2

## 2021-07-29 MED ORDER — THROMBIN 20000 UNITS EX KIT
PACK | CUTANEOUS | Status: AC
  Filled 2021-07-29: qty 1

## 2021-07-29 MED ORDER — MIDAZOLAM HCL 2 MG/2ML IJ SOLN
INTRAMUSCULAR | Status: AC
  Filled 2021-07-29: qty 2

## 2021-07-29 MED ORDER — CHLORHEXIDINE GLUCONATE 0.12 % MT SOLN
OROMUCOSAL | Status: AC
  Administered 2021-07-29: 14:00:00 15 mL via OROMUCOSAL
  Filled 2021-07-29: qty 15

## 2021-07-29 MED ORDER — DEXAMETHASONE SODIUM PHOSPHATE 10 MG/ML IJ SOLN
INTRAMUSCULAR | Status: AC
  Filled 2021-07-29: qty 1

## 2021-07-29 MED ORDER — FENTANYL CITRATE (PF) 250 MCG/5ML IJ SOLN
INTRAMUSCULAR | Status: DC | PRN
  Administered 2021-07-29: 100 ug via INTRAVENOUS
  Administered 2021-07-29 (×2): 50 ug via INTRAVENOUS

## 2021-07-29 MED ORDER — CHLORHEXIDINE GLUCONATE 0.12 % MT SOLN: 15.0000 mL | Freq: Once | OROMUCOSAL | Status: AC

## 2021-07-29 MED ORDER — PROMETHAZINE HCL 25 MG/ML IJ SOLN: 6.2500 mg | INTRAMUSCULAR | Status: DC | PRN

## 2021-07-29 MED ORDER — AMISULPRIDE (ANTIEMETIC) 5 MG/2ML IV SOLN: 10.0000 mg | Freq: Once | INTRAVENOUS | Status: DC | PRN

## 2021-07-29 MED ORDER — FENTANYL CITRATE (PF) 250 MCG/5ML IJ SOLN
INTRAMUSCULAR | Status: AC
  Filled 2021-07-29: qty 5

## 2021-07-29 MED ORDER — PROPOFOL 10 MG/ML IV BOLUS
INTRAVENOUS | Status: DC | PRN
Start: 2021-07-29 — End: 2021-07-29
  Administered 2021-07-29: 200 mg via INTRAVENOUS

## 2021-07-29 MED ORDER — MINERAL OIL LIGHT OIL
TOPICAL_OIL | Freq: Once | Status: DC
  Filled 2021-07-29: qty 10
  Filled 2021-07-29: qty 30

## 2021-07-29 MED ORDER — LIDOCAINE 2% (20 MG/ML) 5 ML SYRINGE
INTRAMUSCULAR | Status: AC
  Filled 2021-07-29: qty 5

## 2021-07-29 MED ORDER — CHLORHEXIDINE GLUCONATE CLOTH 2 % EX PADS: 6.0000 | MEDICATED_PAD | Freq: Once | CUTANEOUS | Status: AC

## 2021-07-29 MED ORDER — DEXAMETHASONE SODIUM PHOSPHATE 10 MG/ML IJ SOLN
INTRAMUSCULAR | Status: DC | PRN
  Administered 2021-07-29: 10 mg via INTRAVENOUS

## 2021-07-29 MED ORDER — METHADONE HCL 10 MG PO TABS
50.0000 mg | ORAL_TABLET | Freq: Every day | ORAL | Status: DC
  Administered 2021-07-30 – 2021-08-01 (×3): 50 mg via ORAL
  Filled 2021-07-29 (×3): qty 5

## 2021-07-29 MED ORDER — PROPOFOL 10 MG/ML IV BOLUS
INTRAVENOUS | Status: AC
  Filled 2021-07-29: qty 20

## 2021-07-29 MED ORDER — KETAMINE HCL 50 MG/5ML IJ SOSY
PREFILLED_SYRINGE | INTRAMUSCULAR | Status: AC
  Filled 2021-07-29: qty 5

## 2021-07-29 MED ORDER — FENTANYL CITRATE (PF) 100 MCG/2ML IJ SOLN
25.0000 ug | INTRAMUSCULAR | Status: DC | PRN
  Administered 2021-07-29 (×2): 50 ug via INTRAVENOUS

## 2021-07-29 MED ORDER — SODIUM CHLORIDE 0.9 % IR SOLN
Status: DC | PRN
  Administered 2021-07-29: 3000 mL

## 2021-07-29 MED ORDER — CELECOXIB 200 MG PO CAPS
ORAL_CAPSULE | ORAL | Status: AC
  Filled 2021-07-29: qty 1

## 2021-07-29 MED ORDER — LACTATED RINGERS IV SOLN
INTRAVENOUS | Status: DC | PRN
Start: 2021-07-29 — End: 2021-07-29

## 2021-07-29 MED ORDER — MINERAL OIL LIGHT 100 % EX OIL
TOPICAL_OIL | CUTANEOUS | Status: DC | PRN
  Administered 2021-07-29: 1 via TOPICAL

## 2021-07-29 MED ORDER — ORAL CARE MOUTH RINSE: 15.0000 mL | Freq: Once | OROMUCOSAL | Status: AC

## 2021-07-29 MED ORDER — ONDANSETRON HCL 4 MG/2ML IJ SOLN
INTRAMUSCULAR | Status: DC | PRN
Start: 2021-07-29 — End: 2021-07-29
  Administered 2021-07-29: 4 mg via INTRAVENOUS

## 2021-07-29 MED ORDER — LACTATED RINGERS IV SOLN: INTRAVENOUS | Status: DC

## 2021-07-29 MED ORDER — ROCURONIUM BROMIDE 10 MG/ML (PF) SYRINGE
PREFILLED_SYRINGE | INTRAVENOUS | Status: AC
  Filled 2021-07-29: qty 10

## 2021-07-29 MED ORDER — CELECOXIB 200 MG PO CAPS
200.0000 mg | ORAL_CAPSULE | Freq: Once | ORAL | Status: AC
  Administered 2021-07-29: 14:00:00 200 mg via ORAL

## 2021-07-29 MED ORDER — FENTANYL CITRATE (PF) 100 MCG/2ML IJ SOLN
INTRAMUSCULAR | Status: AC
  Filled 2021-07-29: qty 2

## 2021-07-29 MED ORDER — ACETAMINOPHEN 500 MG PO TABS
1000.0000 mg | ORAL_TABLET | Freq: Once | ORAL | Status: AC
  Administered 2021-07-29: 14:00:00 1000 mg via ORAL
  Filled 2021-07-29: qty 2

## 2021-07-29 MED ORDER — CHLORHEXIDINE GLUCONATE CLOTH 2 % EX PADS: 6.0000 | MEDICATED_PAD | Freq: Once | CUTANEOUS | Status: DC

## 2021-07-29 MED ORDER — MIDAZOLAM HCL 2 MG/2ML IJ SOLN
INTRAMUSCULAR | Status: DC | PRN
  Administered 2021-07-29: 2 mg via INTRAVENOUS

## 2021-07-29 MED ORDER — THROMBIN 20000 UNITS EX KIT
PACK | CUTANEOUS | Status: DC | PRN
Start: 2021-07-29 — End: 2021-07-29
  Administered 2021-07-29: 20000 [IU] via TOPICAL

## 2021-07-29 MED ORDER — BUPIVACAINE HCL 0.25 % IJ SOLN
INTRAMUSCULAR | Status: DC | PRN
  Administered 2021-07-29: 15 mL

## 2021-07-29 MED ORDER — CEFAZOLIN SODIUM-DEXTROSE 2-4 GM/100ML-% IV SOLN
2.0000 g | INTRAVENOUS | Status: DC
  Filled 2021-07-29: qty 100

## 2021-07-29 MED ORDER — KETAMINE HCL 10 MG/ML IJ SOLN
INTRAMUSCULAR | Status: DC | PRN
  Administered 2021-07-29 (×2): 10 mg via INTRAVENOUS
  Administered 2021-07-29: 30 mg via INTRAVENOUS

## 2021-07-29 SURGICAL SUPPLY — 61 items
BAG DECANTER FOR FLEXI CONT (MISCELLANEOUS) ×2 IMPLANT
BLADE CLIPPER SURG (BLADE) IMPLANT
BLADE DERMATOME SS (BLADE) ×2 IMPLANT
BNDG COHESIVE 4X5 TAN ST LF (GAUZE/BANDAGES/DRESSINGS) ×1 IMPLANT
BNDG ELASTIC 4X5.8 VLCR STR LF (GAUZE/BANDAGES/DRESSINGS) IMPLANT
BNDG ELASTIC 6X5.8 VLCR STR LF (GAUZE/BANDAGES/DRESSINGS) IMPLANT
BNDG GAUZE ELAST 4 BULKY (GAUZE/BANDAGES/DRESSINGS) IMPLANT
BRUSH SCRUB EZ PLAIN DRY (MISCELLANEOUS) ×2 IMPLANT
CANISTER SUCT 3000ML PPV (MISCELLANEOUS) ×1 IMPLANT
CANISTER WOUND CARE 500ML ATS (WOUND CARE) ×1 IMPLANT
COVER MAYO STAND STRL (DRAPES) ×1 IMPLANT
COVER SURGICAL LIGHT HANDLE (MISCELLANEOUS) ×2 IMPLANT
DERMACARRIERS GRAFT 1 TO 1.5 (DISPOSABLE) ×2
DRAPE EXTREMITY T 121X128X90 (DISPOSABLE) ×1 IMPLANT
DRAPE HALF SHEET 40X57 (DRAPES) ×1 IMPLANT
DRAPE INCISE IOBAN 66X45 STRL (DRAPES) ×3 IMPLANT
DRAPE ORTHO SPLIT 77X108 STRL (DRAPES)
DRAPE SURG ORHT 6 SPLT 77X108 (DRAPES) ×2 IMPLANT
DRSG CALCIUM ALGINATE 4X4 (GAUZE/BANDAGES/DRESSINGS) IMPLANT
DRSG MEPITEL 4X7.2 (GAUZE/BANDAGES/DRESSINGS) ×2 IMPLANT
DRSG OPSITE 6X11 MED (GAUZE/BANDAGES/DRESSINGS) IMPLANT
DRSG PAD ABDOMINAL 8X10 ST (GAUZE/BANDAGES/DRESSINGS) IMPLANT
DRSG TEGADERM 4X10 (GAUZE/BANDAGES/DRESSINGS) ×2 IMPLANT
DRSG VAC ATS LRG SENSATRAC (GAUZE/BANDAGES/DRESSINGS) ×1 IMPLANT
DRSG VAC ATS MED SENSATRAC (GAUZE/BANDAGES/DRESSINGS) IMPLANT
DRSG VAC ATS SM SENSATRAC (GAUZE/BANDAGES/DRESSINGS) IMPLANT
ELECT REM PT RETURN 9FT ADLT (ELECTROSURGICAL) ×2
ELECTRODE REM PT RTRN 9FT ADLT (ELECTROSURGICAL) IMPLANT
FILTER STRAW FLUID ASPIR (MISCELLANEOUS) ×1 IMPLANT
GAUZE SPONGE 4X4 12PLY STRL (GAUZE/BANDAGES/DRESSINGS) ×2 IMPLANT
GAUZE XEROFORM 5X9 LF (GAUZE/BANDAGES/DRESSINGS) IMPLANT
GLOVE SURG ENC TEXT LTX SZ7.5 (GLOVE) ×2 IMPLANT
GLOVE SURG UNDER LTX SZ8 (GLOVE) ×2 IMPLANT
GOWN STRL REUS W/ TWL LRG LVL3 (GOWN DISPOSABLE) ×2 IMPLANT
GOWN STRL REUS W/TWL LRG LVL3 (GOWN DISPOSABLE) ×2
GRAFT DERMACARRIERS 1 TO 1.5 (DISPOSABLE) ×1 IMPLANT
HANDPIECE INTERPULSE COAX TIP (DISPOSABLE)
IV NS 1000ML (IV SOLUTION)
IV NS 1000ML BAXH (IV SOLUTION) ×1 IMPLANT
KIT BASIN OR (CUSTOM PROCEDURE TRAY) ×2 IMPLANT
KIT TURNOVER KIT B (KITS) ×2 IMPLANT
MANIFOLD NEPTUNE II (INSTRUMENTS) ×1 IMPLANT
MARKER SKIN DUAL TIP RULER LAB (MISCELLANEOUS) ×1 IMPLANT
NDL SPNL 18GX3.5 QUINCKE PK (NEEDLE) ×2 IMPLANT
NEEDLE SPNL 18GX3.5 QUINCKE PK (NEEDLE) IMPLANT
NS IRRIG 1000ML POUR BTL (IV SOLUTION) ×2 IMPLANT
PACK GENERAL/GYN (CUSTOM PROCEDURE TRAY) ×2 IMPLANT
PAD ARMBOARD 7.5X6 YLW CONV (MISCELLANEOUS) ×4 IMPLANT
SET HNDPC FAN SPRY TIP SCT (DISPOSABLE) IMPLANT
SET IRRIG Y TYPE TUR BLADDER L (SET/KITS/TRAYS/PACK) ×1 IMPLANT
STAPLER VISISTAT 35W (STAPLE) ×2 IMPLANT
SUT CHROMIC 4 0 PS 2 18 (SUTURE) IMPLANT
SUT CHROMIC 5 0 PS 3 (SUTURE) ×1 IMPLANT
SUT PDS AB 3-0 SH 27 (SUTURE) IMPLANT
SUT SILK 3 0 SH CR/8 (SUTURE) ×1 IMPLANT
SUT VIC AB 2-0 SH 18 (SUTURE) ×1 IMPLANT
SYR 50ML LL SCALE MARK (SYRINGE) ×2 IMPLANT
SYR CONTROL 10ML LL (SYRINGE) ×2 IMPLANT
TOWEL GREEN STERILE (TOWEL DISPOSABLE) ×2 IMPLANT
TOWEL GREEN STERILE FF (TOWEL DISPOSABLE) ×1 IMPLANT
UNDERPAD 30X36 HEAVY ABSORB (UNDERPADS AND DIAPERS) ×3 IMPLANT

## 2021-07-29 NOTE — Anesthesia Postprocedure Evaluation (Signed)
Anesthesia Post Note  Patient: Justin Fisher  Procedure(s) Performed: SKIN GRAFT SPLIT THICKNESS FROM RIGHT UPPER THIGH TO RIGHT UPPER ARM (Right: Arm Upper) IRRIGATION AND DEBRIDEMENT EXTREMITY (Right: Arm Upper)     Patient location during evaluation: PACU Anesthesia Type: General Level of consciousness: sedated Pain management: pain level controlled Vital Signs Assessment: post-procedure vital signs reviewed and stable Respiratory status: spontaneous breathing and respiratory function stable Cardiovascular status: stable Postop Assessment: no apparent nausea or vomiting Anesthetic complications: no   No notable events documented.  Last Vitals:  Vitals:   07/29/21 1722 07/29/21 1746  BP: (!) 150/89 133/83  Pulse: (!) 105 98  Resp: 12 18  Temp: 36.6 C 36.6 C  SpO2: 92% 96%    Last Pain:  Vitals:   07/29/21 1800  TempSrc:   PainSc: 0-No pain                 Keano Guggenheim DANIEL

## 2021-07-29 NOTE — Op Note (Signed)
Operative Note   DATE OF OPERATION: 07/29/2021  SURGICAL DEPARTMENT: Plastic Surgery  PREOPERATIVE DIAGNOSES: Right arm open wound  POSTOPERATIVE DIAGNOSES:  same  PROCEDURE:  1) simple closure of right arm open wound skin flap to triceps muscle 8 cm 2)  debridement with preparation of right arm for placement of graft 10x20 cm2 (200 cm2) 3) split-thickness skin graft to right arm from right leg 10 x 20 cm2 (200 cm2) 4) Placement of wound vac right arm 10x20 cm2 (200 cm2)  SURGEON: Malory Spurr P. Daylon Lafavor, MD  ASSISTANT: Evelena Leyden, PA  ANESTHESIA:  General.   COMPLICATIONS: None.   INDICATIONS FOR PROCEDURE:  The patient, Justin Fisher is a 36 y.o. male born on November 03, 1985, is here for treatment of right arm open wound. MRN: [id number]  CONSENT:  Informed consent was obtained directly from the patient. Risks, benefits and alternatives were fully discussed. Specific risks including but not limited to bleeding, infection, hematoma, seroma, scarring, pain, contracture, asymmetry, wound healing problems, and need for further surgery were all discussed. The patient did have an ample opportunity to have questions answered to satisfaction.   DESCRIPTION OF PROCEDURE:  The patient was taken to the operating room. SCDs were placed and antibiotics were given. general anesthesia was administered.  The patient's operative site was prepped and draped in a sterile fashion. A time out was performed and all information was confirmed to be correct.    The patient was taken operating laid in the supine position.  The right arm and right leg were prepped and draped in standard sterile fashion.  Right arm was debrided using a curette trying to leave on as much healthy granulation tissue as possible but leaving a thin layer.  I then irrigated with copious saline.  Following this there was an area of loose skin flap that was sutured to the underlying muscle and advanced slightly.  This was 8 cm long.  Vicryl  sutures were used for this.  Skin graft was then harvested from the right thigh at a depth of 14.  This was meshed 1 to 1.5.  The graft was stapled in place on the edge and with basting staples.  Small amount of trimming was done on the edges of the graft.  Following this the graft was bolstered with a wound VAC.  A satisfactory seal was obtained.  The donor site was dressed with Mepetil and Ioban.  The PA Evelena Leyden assistance was necessary for help in securing skin graft, securing the wound VAC and positioning the arm throughout the surgery.  The patient tolerated the procedure well.  There were no complications. The patient was allowed to wake from anesthesia, extubated and taken to the recovery room in satisfactory condition.

## 2021-07-29 NOTE — Anesthesia Procedure Notes (Signed)
Procedure Name: LMA Insertion Date/Time: 07/29/2021 3:23 PM Performed by: Gwenyth Allegra, CRNA Pre-anesthesia Checklist: Patient identified, Emergency Drugs available, Patient being monitored, Timeout performed and Suction available Patient Re-evaluated:Patient Re-evaluated prior to induction Oxygen Delivery Method: Circle system utilized Preoxygenation: Pre-oxygenation with 100% oxygen Induction Type: IV induction LMA: LMA inserted LMA Size: 4.0 Number of attempts: 1 Placement Confirmation: positive ETCO2 and breath sounds checked- equal and bilateral Tube secured with: Tape Dental Injury: Teeth and Oropharynx as per pre-operative assessment

## 2021-07-29 NOTE — Anesthesia Preprocedure Evaluation (Signed)
Anesthesia Evaluation  Patient identified by MRN, date of birth, ID band Patient awake    Reviewed: Allergy & Precautions, H&P , NPO status , Patient's Chart, lab work & pertinent test results  History of Anesthesia Complications Negative for: history of anesthetic complications  Airway Mallampati: II  TM Distance: >3 FB Neck ROM: Full    Dental  (+) Dental Advisory Given   Pulmonary neg shortness of breath, Current Smoker and Patient abstained from smoking.,    breath sounds clear to auscultation       Cardiovascular negative cardio ROS   Rhythm:Regular     Neuro/Psych PSYCHIATRIC DISORDERS Anxiety Depression negative neurological ROS     GI/Hepatic negative GI ROS, (+)     substance abuse  IV drug use,   Endo/Other  negative endocrine ROS  Renal/GU negative Renal ROSLab Results      Component                Value               Date                      CREATININE               0.73                07/19/2021                Musculoskeletal   Abdominal   Peds  Hematology  (+) Blood dyscrasia, anemia , Lab Results      Component                Value               Date                      WBC                      12.9 (H)            07/19/2021                HGB                      10.3 (L)            07/19/2021                HCT                      33.4 (L)            07/19/2021                MCV                      85.4                07/19/2021                PLT                      525 (H)             07/19/2021              Anesthesia Other Findings   Reproductive/Obstetrics  Anesthesia Physical  Anesthesia Plan  ASA: 2  Anesthesia Plan: General   Post-op Pain Management: Toradol IV (intra-op) and Ofirmev IV (intra-op)   Induction: Intravenous  PONV Risk Score and Plan: 1 and Ondansetron and Dexamethasone  Airway Management Planned:  LMA  Additional Equipment: None  Intra-op Plan:   Post-operative Plan: Extubation in OR  Informed Consent: I have reviewed the patients History and Physical, chart, labs and discussed the procedure including the risks, benefits and alternatives for the proposed anesthesia with the patient or authorized representative who has indicated his/her understanding and acceptance.     Dental advisory given  Plan Discussed with: CRNA and Anesthesiologist  Anesthesia Plan Comments:         Anesthesia Quick Evaluation

## 2021-07-29 NOTE — Progress Notes (Addendum)
[facility] PROGRESS NOTE  Justin Fisher [id number] DOB: 05-11-1986 DOA: 07/09/2021 PCP: Justin Fisher, No Pcp Per (Inactive)  Status: Remains inpatient appropriate because:  Unsafe discharge plan noting requires IV antibiotics for current medical condition history of IVD use not a candidate for home IV therapy; also requiring frequent reevaluation by surgical team regarding wound on upper right arm that is also requiring wound VAC therapy  Barriers to discharge: Social:Known history of active IV drug abuse prior to admission  Clinical: Continues to require IV antibiotics and regular wound therapy for wound upper extremity.  May require additional surgical procedures including plastic surgery  Level of care:  Med-Surg   Code Status: Full Family Communication: Justin Fisher only DVT prophylaxis: SCDs COVID vaccination status: Unknown    HPI: 36 year old M with PMH of IVDU (heroin, fentanyl and meth), anxiety, HLD, suicidal ideation and tobacco use disorder presenting to ED on 07/09/2021 with progressive severe right upper extremity pain, swelling, oozing brown and foul-smelling liquid after injecting IV drugs using a needle with blood in it, and admitted for sepsis due to right upper arm cellulitis/neck fasciitis/abscess/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 orthopedic surgery and ID consulted.  Initially started on vancomycin, Zosyn and clindamycin.  He underwent I&D with VAC placement on 1/5 and 1/8.  Blood cultures NGTD.  Surgical culture with group F Streptococcus.  Antibiotics de-escalated to IV Zosyn.  ID, orthopedic surgery and plastic surgery following.   Hospital course complicated by acute blood loss anemia related to surgery.  He had about 2 L blood transfused from 1/8-1/9.  Subjective: Reporting worsening right groin/thigh pain now radiating down to the calf and not improved with current treatment regimen.  Objective: Vitals:   07/28/21 2100 07/29/21 0522  BP:  123/78 133/78  Pulse: 99 90  Resp: 20 18  Temp: 98.7 F (37.1 C) 98.4 F (36.9 C)  SpO2: 99% 100%    Intake/Output Summary (Last 24 hours) at 07/29/2021 [id number] Last data filed at 07/29/2021 0400 Gross per 24 hour  Intake 240 ml  Output 250 ml  Net -10 ml   Filed Weights   07/10/21 2308  Weight: 73.4 kg    Exam:  Constitutional: No acute distress except for recurrent right groin medial thigh pain Respiratory: clear to auscultation bilaterally, no wheezing, no crackles. Normal respiratory effort.  RA Cardiovascular: Regular pulse, S1S2, No lower extremity edema.  Abdomen: no tenderness, no masses palpated. Bowel sounds positive. LBM 1/23 Skin: Wound VAC over upper right arm over surgical wound Neurologic: CN 2-12 grossly intact. Sensation intact, Strength 5/5 x all 4 extremities.  Psychiatric: Awake and oriented x3.    Assessment/Plan: Acute problems: Right upper arm cellulitis/neck fasciitis/acute osteomyelitis and Justin Fisher with history of IVDU/s/p ACELL graft placement 1/13 -Superficial wound culture positive for moderate strep group F with intraoperative surgical culture from 1/5 with moderate strep group F; blood cultures negative-as of 1/20 WBC has normalized -Orthopedic surgery, infectious disease and plastic surgery following. -IV Vanco, Zosyn and clinda>> IV Zosyn>> IV Unasyn until 07/26/2021 then p.o. Augmentin until 08/23/2021 -On methadone -continue IV Dilaudid for severe breakthrough pain and oxycodone for moderate breakthrough pain -TOC discussed with surgeon and plans are to proceed with skin graft in about 1 week with short-term inpatient application of wound VAC to be followed by traditional wound care upon discharge.  Given his substance abuse history he is not eligible for home health services -Plastics team has okayed initiation of physical therapy to right arm -*For skin graft today 1/24  Polysubstance use/IVDU including heroin, cocaine, fentanyl, meth and  tobacco -UDS positive for cocaine on admission.  HIV nonreactive.  Hepatitis C antibody positive. -Continue nicotine gum but adjust schedule to every 4 hours while awake and add a low-dose nicotine patch 7 mg/day -Hep B vaccine -Outpatient follow-up with [facility] upon discharge. -Methadone dose increased to 35 mg on 1/15.  No further dose adjustments until 72 hours after last dose adjustment.  1/24 increase to 50 mg   Mild transaminitis -Nonobstructive pattern: AST 39> 61 > 74 > 85 ALT 29 > 48 > 55 > 72 -Likely from Unasyn which should be completed by next week-LD 1/21 and now is on Augmentin-also started on Ibuprofen last week which can also increase LFTs although increase did start prior to initiation of NSAID. I dw ID for recs-cont to follow since stable and readings not very elevated -Follow LFTs  -We will hold off on making any medication changes at this point  Muscle strain -Continue low-dose ibuprofen 400 mg 3 times daily x5 days -CK 28 -Add K pad on 1/23 and ask physical therapy to assist -Worse per pt report 1/24- obtain CT leg to r/o myositis   Acute blood loss anemia superimposed on chronic iron deficiency anemia -Hgb 15.5 in 2018.   -s/p 5 units of PRBCs this admission -Current hemoglobin 10.7   Uncontrolled hypertension:  Not on medication at home. -Continue hydralazine as needed   Hyponatremia:  Resolved   Hypokalemia:  Resolved   History of anxiety/depression:  Stable. -On Vistaril at home -frequency increased this hospitalization to every 6 hours as needed -Anxiety has improved with the addition of methadone as above   Leukocytosis/bandemia/thrombocytosis:  Likely reactive due to #1. -Continue monitoring   Hepatitis C antibody positive -Follow quantitative RNA      Data Reviewed: Basic Metabolic Panel: Recent Labs  Lab 07/24/21 0547 07/25/21 0717  NA 135 138  K 4.3 5.0  CL 100 104  CO2 24 23  GLUCOSE 115* 101*  BUN 18 21*  CREATININE 0.51*  0.55*  CALCIUM 9.2 9.2   Liver Function Tests: Recent Labs  Lab 07/24/21 0547 07/25/21 0717  AST 61* 74*  ALT 48* 55*  ALKPHOS 124 133*  BILITOT 0.4 0.4  PROT 7.3 7.0  ALBUMIN 3.2* 3.1*    CBC: Recent Labs  Lab 07/24/21 0547  WBC 10.1  NEUTROABS 4.1  HGB 11.3*  HCT 37.3*  MCV 86.5  PLT 505*   Cardiac Enzymes: Recent Labs  Lab 07/25/21 0717  CKTOTAL 28*     No results found for this or any previous visit (from the past 240 hour(s)).      Scheduled Meds:  amoxicillin-clavulanate  1 tablet Oral Q12H   Chlorhexidine Gluconate Cloth  6 each Topical Once   And   Chlorhexidine Gluconate Cloth  6 each Topical Once   docusate sodium  100 mg Oral BID   feeding supplement  237 mL Oral BID BM   gabapentin  400 mg Oral TID   ibuprofen  400 mg Oral TID WC   methadone  45 mg Oral Daily   mirtazapine  30 mg Oral QHS   multivitamin with minerals  1 tablet Oral Daily   nicotine  7 mg Transdermal Daily   nicotine polacrilex  4 mg Oral Q4H while awake   sertraline  200 mg Oral Daily   traZODone  100 mg Oral QHS   Continuous Infusions:    Principal Problem:   Sepsis ([place]) Active Problems:  Heroin use disorder, moderate, dependence (HCC)   Anemia   Cellulitis   Hypokalemia   Hyponatremia   Tobacco abuse   Protein-calorie malnutrition, severe   Malnutrition of moderate degree   Abscess of right arm   Muscle strain   Consultants: Orthopedic team Plastic surgery Infectious disease  Procedures: Intraoperative IND with wound VAC placement of RUE on 1/5 and 1/8 Echocardiogram  Antibiotics: Zosyn 1/4 through 1/9 Vancomycin 1/4 through 1/7 Cefazolin x1 dose 1/5 Clindamycin 1/5 through 1/7 Unasyn 1/9 >>   Time spent: 25 minutes    Erin Hearing ANP  [facility] 7 am - 330 pm/M-F for direct Justin Fisher care and secure chat Please refer to Amion for contact info 20  days

## 2021-07-29 NOTE — Interval H&P Note (Signed)
History and Physical Interval Note:  07/29/2021 3:01 PM  [name]  has presented today for surgery, with the diagnosis of Right upper arm.  The various methods of treatment have been discussed with the patient and family. After consideration of risks, benefits and other options for treatment, the patient has consented to  Procedure(s): SKIN GRAFT SPLIT THICKNESS (Right) as a surgical intervention.  The patient's history has been reviewed, patient examined, no change in status, stable for surgery.  I have reviewed the patient's chart and labs.  Questions were answered to the patient's satisfaction.     Justin Fisher

## 2021-07-29 NOTE — Evaluation (Signed)
Physical Therapy Evaluation Patient Details Name: Justin Fisher MRN: [id number] DOB: April 05, 1986 Today's Date: 07/29/2021  History of Present Illness  Patient is a 36 y/o male admitted on 07/07/21 with RUE swelling, pain and foul smelling liquid draining after injecting IV druge. Found to have sepsis secondary to RLE cellulitis/neck fasciitis/abscess/right humeral osteomyelitis and positive group F Strep. s/p I&D with wound vac placement 1/5 and 07/13/21. s/p debridement again 07/22/21. PMH includes IVDU (heroin, fentanyl and meth), anxiety, suicidal ideation and tobacco use disorder.  Clinical Impression  Patient presents with decreased AROM/PROM and strength of RUE, pain and impaired mobility s/p above. Pt independent PTA. Difficulty ambulating today due to pain in right groin. Provided AAROM for RUE in supine as well as isometrics for RLE to address possible muscle strain (further imaging being done to rule out infection of groin/hip). Provided stretch for cervical spine and manually worked on scapular mobility in relation to shoulder AROM on right. Shoulder AROM very limited in all directions. Provided heat pack for groin pain. Encouraged OOB mobility and exercises a few times daily. Would benefit from OPPT to gain as much function/range in RUE as possible as well as work on right groin discomfort. Will follow acutely.     Recommendations for follow up therapy are one component of a multi-disciplinary discharge planning process, led by the attending physician.  Recommendations may be updated based on patient status, additional functional criteria and insurance authorization.  Follow Up Recommendations Outpatient PT    Assistance Recommended at Discharge PRN  Patient can return home with the following  Assistance with cooking/housework;Help with stairs or ramp for entrance;A little help with walking and/or transfers;Assist for transportation    Equipment Recommendations None recommended by PT   Recommendations for Other Services       Functional Status Assessment Patient has had a recent decline in their functional status and demonstrates the ability to make significant improvements in function in a reasonable and predictable amount of time.     Precautions / Restrictions Precautions Precautions: None;Other (comment) Precaution Comments: wound vac, right groin pain Restrictions Weight Bearing Restrictions: No      Mobility  Bed Mobility Overal bed mobility: Needs Assistance Bed Mobility: Supine to Sit, Sit to Supine     Supine to sit: Min guard, HOB elevated Sit to supine: Min guard, HOB elevated   General bed mobility comments: Difficulty getting to EOB with multiple attempts to elevate trunk with LEs flexed at hips/knees, using LUE as well on rail.    Transfers Overall transfer level: Needs assistance Equipment used: None Transfers: Sit to/from Stand Sit to Stand: Supervision           General transfer comment: Supervision for safety.    Ambulation/Gait Ambulation/Gait assistance: Supervision Gait Distance (Feet): 35 Feet Assistive device: None Gait Pattern/deviations: Step-to pattern, Step-through pattern, Decreased weight shift to right       General Gait Details: Slow, mostly steady gait with decreased weight shift and stance time RLE due to pain in groin with difficulty advancing RLE using circumduction at times. Loosens up slightly with distance but still painful.  Stairs            Wheelchair Mobility    Modified Rankin (Stroke Patients Only)       Balance Overall balance assessment: Modified Independent  Pertinent Vitals/Pain Pain Assessment Pain Assessment: Faces Faces Pain Scale: Hurts even more Pain Location: right groin with movement, RUE with AROM Pain Descriptors / Indicators: Sore, Discomfort, Constant, Guarding, Grimacing Pain Intervention(s): Monitored  during session, Heat applied, Limited activity within patient's tolerance, Repositioned    Home Living Family/patient expects to be discharged to:: Private residence Living Arrangements: Other (Comment) (roomates) Available Help at Discharge: Friend(s);Available PRN/intermittently             Home Equipment: None      Prior Function Prior Level of Function : Independent/Modified Independent                     Hand Dominance   Dominant Hand: Right    Extremity/Trunk Assessment   Upper Extremity Assessment Upper Extremity Assessment: Defer to OT evaluation;RUE deficits/detail RUE Deficits / Details: Limited P/AROM shoulder flexion, abduction, lacking full elbow extension and flexion, poor scapular mobility as well. Wrist/digit AROm WFLs. RUE Sensation: WNL    Lower Extremity Assessment Lower Extremity Assessment: RLE deficits/detail RLE Deficits / Details: AROM Limited due to pain with hip flexion, knee extension. no bucking with standing or walking. More range gained passively into hip flexion/knee extension/flexion with less pain. RLE: Unable to fully assess due to pain RLE Sensation: WNL    Cervical / Trunk Assessment Cervical / Trunk Assessment: Other exceptions Cervical / Trunk Exceptions: tightness to palpation upper traps esp on right side  Communication   Communication: No difficulties  Cognition Arousal/Alertness: Awake/alert Behavior During Therapy: WFL for tasks assessed/performed Overall Cognitive Status: Within Functional Limits for tasks assessed                                          General Comments      Exercises Other Exercises Other Exercises: Instructed pt in AAROM of RUE in supine using LUE to assist into flexion, abduction Other Exercises: Isometrics of right hip flexors 2-3 sec hold in supine and sitting EOB Other Exercises: Worked on manually assisting scapular rotation of RUE with shoulder flexion Other  Exercises: Cervical trap stretch on right   Assessment/Plan    PT Assessment Patient needs continued PT services  PT Problem List Decreased range of motion;Decreased strength;Decreased mobility;Pain       PT Treatment Interventions Patient/family education;Therapeutic exercise;Gait training;Therapeutic activities;Stair training;Functional mobility training;Modalities;Manual techniques;Neuromuscular re-education    PT Goals (Current goals can be found in the Care Plan section)  Acute Rehab PT Goals Patient Stated Goal: to be able to get range back in my arm and decrease pain in groin PT Goal Formulation: With patient Time For Goal Achievement: 08/12/21 Potential to Achieve Goals: Good    Frequency Min 2X/week     Co-evaluation               AM-PAC PT "6 Clicks" Mobility  Outcome Measure Help needed turning from your back to your side while in a flat bed without using bedrails?: A Little Help needed moving from lying on your back to sitting on the side of a flat bed without using bedrails?: A Little Help needed moving to and from a bed to a chair (including a wheelchair)?: None Help needed standing up from a chair using your arms (e.g., wheelchair or bedside chair)?: None Help needed to walk in hospital room?: None Help needed climbing 3-5 steps with a railing? : A Little 6 Click  Score: 21    End of Session   Activity Tolerance: Patient tolerated treatment well;Patient limited by pain Patient left: in bed;with call bell/phone within reach Nurse Communication: Mobility status PT Visit Diagnosis: Pain Pain - Right/Left: Right Pain - part of body: Shoulder (groin)    Time: [id number] PT Time Calculation (min) (ACUTE ONLY): 33 min   Charges:   PT Evaluation $PT Eval Low Complexity: 1 Low PT Treatments $Therapeutic Activity: 8-22 mins        Vale HavenShauna H, PT, DPT Acute Rehabilitation Services Pager [phone] Office [phone]     Blake DivineShauna A  Dawnita Molner 07/29/2021, 1:30 PM

## 2021-07-29 NOTE — Transfer of Care (Signed)
Immediate Anesthesia Transfer of Care Note  Patient: Justin Fisher  Procedure(s) Performed: SKIN GRAFT SPLIT THICKNESS FROM RIGHT UPPER THIGH TO RIGHT UPPER ARM (Right: Arm Upper)  Patient Location: PACU  Anesthesia Type:General  Level of Consciousness: awake, alert  and oriented  Airway & Oxygen Therapy: Patient Spontanous Breathing  Post-op Assessment: Report given to RN and Post -op Vital signs reviewed and stable  Post vital signs: Reviewed and stable  Last Vitals:  Vitals Value Taken Time  BP 147/69 07/29/21 1707  Temp    Pulse 103 07/29/21 1709  Resp 14 07/29/21 1709  SpO2 99 % 07/29/21 1709  Vitals shown include unvalidated device data.  Last Pain:  Vitals:   07/29/21 1402  TempSrc: Oral  PainSc: 5       Patients Stated Pain Goal: 2 ([id number])  Complications: No notable events documented.

## 2021-07-29 NOTE — TOC Progression Note (Addendum)
Transition of Care [facility]) - Progression Note    Patient Details  Name: Justin Fisher MRN: [id number] Date of Birth: 02-27-1986  Transition of [facility]) CM/SW [place], RN Phone Number: 07/29/2021, 11:36 AM  Clinical Narrative:    CM met with the patient today to discuss transitions of care.  The patient is currently NPO and is scheduled for skin graft today with plastic surgery MD at 2 pm to return to the OR.  Home health is not an option for this patient at this time due to patient's past history of drug use.  The patient will not be able to discharge home with wound vac due to lack of reliable friends/family to assist with complex wound care needs.  I was unable to obtain follow up at the [place] care clinic as well.  Plans are to send the patient home at a later date when he as established wound care follow up at the physician office after discharge from the hospital.    CM and MSW with DTP Team will continue to follow the patient for discharge planning needs for home.   Expected Discharge Plan: Home/Self Care Barriers to Discharge: Continued Medical Work up  Expected Discharge Plan and Services Expected Discharge Plan: Home/Self Care In-house Referral: Clinical Social Work Discharge Planning Services: CM Consult Post Acute Care Choice: Durable Medical Equipment (Patient has a [place] at the present - TOC will follow for wound vac needs at discharge if needed per Surgery MD/Plastics MD) Living arrangements for the past 2 months: Single Family Home                                       Social Determinants of Health (SDOH) Interventions    Readmission Risk Interventions Readmission Risk Prevention Plan 07/17/2021  Transportation Screening Complete  PCP or Specialist Appt within 5-7 Days Complete  Home Care Screening Complete  Medication Review (RN CM) Complete  Some recent data might be hidden

## 2021-07-30 ENCOUNTER — Encounter (HOSPITAL_COMMUNITY): Payer: Self-pay | Admitting: Plastic Surgery

## 2021-07-30 MED ORDER — ALUM & MAG HYDROXIDE-SIMETH 200-200-20 MG/5ML PO SUSP
30.0000 mL | ORAL | Status: DC | PRN
  Administered 2021-07-30: 10:00:00 30 mL via ORAL
  Filled 2021-07-30: qty 30

## 2021-07-30 MED ORDER — CELECOXIB 100 MG PO CAPS
100.0000 mg | ORAL_CAPSULE | Freq: Two times a day (BID) | ORAL | Status: DC
  Administered 2021-07-31 – 2021-08-05 (×12): 100 mg via ORAL
  Filled 2021-07-30 (×14): qty 1

## 2021-07-30 NOTE — Progress Notes (Signed)
Nutrition Follow-up  DOCUMENTATION CODES:   Non-severe (moderate) malnutrition in context of social or environmental circumstances  INTERVENTION:  -continue Ensure Enlive po BID, each supplement provides 350 kcal and 20 grams of protein -continue MVI with minerals daily  NUTRITION DIAGNOSIS:   Moderate Malnutrition related to social / environmental circumstances (IV drug use) as evidenced by mild fat depletion, moderate muscle depletion.  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  1/05 s/p I&D R arm w/ VAC placement  1/08 s/p I&D R arm w/ VAC placement 1/11 s/p I&D R arm w/ VAC placement 1/13 s/p prep of R arm for Acell placement, evacuation of 84ml hematoma, placement of VAC 1/17 s/p I&D R arm for placement of graft, placement of Acell R arm, placement of wound VAC R arm 1/24 s/p simple closure of R arm open wound skin flap to triceps muscle, debridement w/ prep of R arm for placement of graft, split-thickness skin graft to R arm from R leg, and placement of wound vac R arm   Pt c/o pain that is poorly controlled with current regimen. Continues to endorse good appetite and supplement acceptance. Last 8 meals documented as 25-100% meal completion (87.5% avg meal intake)  UOP: x24 hours I/O: - since admit  Medications: colace, Ensure Enlive BID, remeron, mvi with minerals  Labs: Recent Labs  Lab 07/24/21 0547 07/25/21 0717 07/29/21 0819  NA 135 138 135  K 4.3 5.0 6.1*  CL 100 104 101  CO2 24 23 23   BUN 18 21* 14  CREATININE 0.51* 0.55* 0.61  CALCIUM 9.2 9.2 9.3  GLUCOSE 115* 101* 102*  Elevated LFTs    Diet Order:    Diet Order             Diet regular Room service appropriate? Yes; Fluid consistency: Thin  Diet effective now                   EDUCATION NEEDS:   Education needs have been addressed  Skin:  Skin Assessment: Skin Integrity Issues: Skin Integrity Issues:: Incisions Incisions: R arm  Last BM:  1/23  Height:   Ht Readings from Last 1 Encounters:  07/29/21 5\' 7"  (1.702 m)    Weight:   Wt Readings from Last 1 Encounters:  07/29/21 72 kg    BMI:  Body mass index is 24.86 kg/m.  Estimated Nutritional Needs:   Kcal:  2200-2400  Protein:  110-130g  Fluid:  >/=2.2L     ., MS, RD, LDN (she/her/hers) RD pager number and weekend/on-call pager number located in Amion.

## 2021-07-30 NOTE — Progress Notes (Signed)
Patient ID: Justin Fisher, male   DOB: 1986-02-23, 36 y.o.   MRN: [id number]  Entered room to assess patient. Wound vacuum was off, battery depleted. Error code states dressing is to be replaced if wound vacuum is off more than 2 hours. Wound care team paged.  Lidia Collum, RN

## 2021-07-30 NOTE — Plan of Care (Signed)
°  Problem: Nutritional: Goal: Nutritional status of the infant will improve as evidenced by minimal weight loss and appropriate weight gain for gestational age Outcome: Progressing Goal: Ability to maintain a balanced intake and output will improve Outcome: Progressing   Problem: Clinical Measurements: Goal: Ability to maintain clinical measurements within normal limits will improve Outcome: Progressing   Problem: Skin Integrity: Goal: Risk for impaired skin integrity will decrease Outcome: Progressing Goal: Demonstrates signs of wound healing without infection Outcome: Progressing   Problem: Education: Goal: Knowledge of General Education information will improve Description: Including pain rating scale, medication(s)/side effects and non-pharmacologic comfort measures Outcome: Progressing   Problem: Health Behavior/Discharge Planning: Goal: Ability to manage health-related needs will improve Outcome: Progressing   Problem: Clinical Measurements: Goal: Ability to maintain clinical measurements within normal limits will improve Outcome: Progressing Goal: Will remain free from infection Outcome: Progressing Goal: Diagnostic test results will improve Outcome: Progressing Goal: Respiratory complications will improve Outcome: Progressing Goal: Cardiovascular complication will be avoided Outcome: Progressing   Problem: Activity: Goal: Risk for activity intolerance will decrease Outcome: Progressing   Problem: Nutrition: Goal: Adequate nutrition will be maintained Outcome: Progressing   Problem: Coping: Goal: Level of anxiety will decrease Outcome: Progressing   Problem: Elimination: Goal: Will not experience complications related to bowel motility Outcome: Progressing Goal: Will not experience complications related to urinary retention Outcome: Progressing   Problem: Pain Managment: Goal: General experience of comfort will improve Outcome: Progressing   Problem:  Safety: Goal: Ability to remain free from injury will improve Outcome: Progressing   Problem: Skin Integrity: Goal: Risk for impaired skin integrity will decrease Outcome: Progressing

## 2021-07-30 NOTE — Progress Notes (Signed)
[facility] PROGRESS NOTE  Justin Fisher [id number] DOB: 04/01/1986 DOA: 07/09/2021 PCP: Patient, No Pcp Per (Inactive)  Status: Remains inpatient appropriate because:  Unsafe discharge plan noting requires IV antibiotics for current medical condition history of IVD use not a candidate for home IV therapy; also requiring frequent reevaluation by surgical team regarding wound on upper right arm that is also requiring wound VAC therapy  Barriers to discharge: Social:Known history of active IV drug abuse prior to admission  Clinical: Continues to require IV antibiotics and regular wound therapy for wound upper extremity.  May require additional surgical procedures including plastic surgery  Wound pics since admit:                          Level of care:  Med-Surg   Code Status: Full Family Communication: Patient only DVT prophylaxis: SCDs COVID vaccination status: Unknown  HPI: 36 year old M with PMH of IVDU (heroin, fentanyl and meth), anxiety, HLD, suicidal ideation and tobacco use disorder presenting to ED on 07/09/2021 with progressive severe right upper extremity pain, swelling, oozing brown and foul-smelling liquid after injecting IV drugs using a needle with blood in it, and admitted for sepsis due to right upper arm cellulitis/neck fasciitis/abscess/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 orthopedic surgery and ID consulted.  Initially started on vancomycin, Zosyn and clindamycin.  He underwent I&D with VAC placement on 1/5 and 1/8.  Blood cultures NGTD.  Surgical culture with group F Streptococcus.  Antibiotics de-escalated to IV Zosyn.  ID, orthopedic surgery and plastic surgery following.   Hospital course complicated by acute blood loss anemia related to surgery.  He had about 2 L blood transfused from 1/8-1/9.  Subjective: Alert and oriented.  Sitting on side of bed.  Continues to have some medial thigh pain.  Discussed potential anticipated  discharge date as of Monday 1/30 pending plastic surgery discontinuation of wound VAC and initiation of routine noncomplicated wound care.  States NSAIDs worked very well for pain and leg noting that now scheduled dose has been discontinued.  Stated had previously been given Celebrex and this worked well  Objective: Vitals:   07/30/21 0503 07/30/21 0758  BP: 111/71 121/66  Pulse: 97 88  Resp: 19 18  Temp: 98.3 F (36.8 C) 98.3 F (36.8 C)  SpO2: 96% 97%    Intake/Output Summary (Last 24 hours) at 07/30/2021 0803 Last data filed at 07/30/2021 0526 Gross per 24 hour  Intake 1316.33 ml  Output 1050 ml  Net 266.33 ml   Filed Weights   07/10/21 2308 07/29/21 1408  Weight: 73.4 kg 72 kg    Exam:  Constitutional: No acute distress Respiratory: clear to auscultation bilaterally, no wheezing, no crackles. Normal respiratory effort.  RA Cardiovascular: Regular pulse, S1S2, No lower extremity edema.  Abdomen: no tenderness, no masses palpated. Bowel sounds positive. LBM 1/23 Skin: Wound VAC over upper right arm over surgical wound Neurologic: CN 2-12 grossly intact. Sensation intact, Strength 5/5 x all 4 extremities.  Psychiatric: Awake and oriented x3.    Assessment/Plan: Acute problems: Right upper arm cellulitis/neck fasciitis/acute osteomyelitis and patient with history of IVDU/s/p ACELL graft placement 1/13 -Superficial wound culture positive for moderate strep group F with intraoperative surgical culture from 1/5 with moderate strep group F; blood cultures negative-as of 1/20 WBC has normalized -Orthopedic surgery, infectious disease and plastic surgery following. -IV Vanco, Zosyn and clinda>> IV Zosyn>> IV Unasyn until 07/26/2021 then p.o. Augmentin until 08/23/2021 -Status post skin  graft on 1/24.  Anticipate can discharge home after current wound VAC discontinued -Plastics team has okayed initiation of physical therapy to right arm -*For skin graft today 1/24   Polysubstance  use/IVDU including heroin, cocaine, fentanyl, meth and tobacco -UDS positive for cocaine on admission.  HIV nonreactive.  Hepatitis C antibody positive. -Continue nicotine gum but adjust schedule to every 4 hours while awake and add a low-dose nicotine patch 7 mg/day -Hep B vaccine -Outpatient follow-up with [facility] upon discharge.  1/24 methadone increased to 50 mg   Mild transaminitis -Nonobstructive pattern: AST 39> 61 > 74 > 85 ALT 29 > 48 > 55 > 72 -Multiple potential medication sources: Tylenol, recent Unasyn, Augmentin and NSAIDs. -1/25 cautiously beginning scheduled Celebrex.  We will check hepatic function panel again on Friday 1/27-if LFTs continue to climb will need to discontinue Celebrex  Muscle strain -Continue low-dose ibuprofen 400 mg 3 times daily x5 days -CK 28 -Add K pad on 1/23 and ask physical therapy to assist -CT leg w/ contrast to r/o myositis ending   Acute blood loss anemia superimposed on chronic iron deficiency anemia -Hgb 15.5 in 2018.   -s/p 5 units of PRBCs this admission -Current hemoglobin 10.7   Uncontrolled hypertension:  Not on medication at home. -Continue hydralazine as needed   Hyponatremia:  Resolved   Hypokalemia:  Resolved   History of anxiety/depression:  Stable. -On Vistaril at home -frequency increased this hospitalization to every 6 hours as needed -Anxiety has improved with the addition of methadone as above   Leukocytosis/bandemia/thrombocytosis:  Likely reactive due to #1. -Continue monitoring   Hepatitis C antibody positive -Follow quantitative RNA   Data Reviewed: Basic Metabolic Panel: Recent Labs  Lab 07/24/21 0547 07/25/21 0717 07/29/21 0819  NA 135 138 135  K 4.3 5.0 6.1*  CL 100 104 101  CO2 24 23 23   GLUCOSE 115* 101* 102*  BUN 18 21* 14  CREATININE 0.51* 0.55* 0.61  CALCIUM 9.2 9.2 9.3   Liver Function Tests: Recent Labs  Lab 07/24/21 0547 07/25/21 0717 07/29/21 0819  AST 61* 74* 85*  ALT 48*  55* 72*  ALKPHOS 124 133* 109  BILITOT 0.4 0.4 1.2  PROT 7.3 7.0 8.1  ALBUMIN 3.2* 3.1* 3.5    CBC: Recent Labs  Lab 07/24/21 0547  WBC 10.1  NEUTROABS 4.1  HGB 11.3*  HCT 37.3*  MCV 86.5  PLT 505*   Cardiac Enzymes: Recent Labs  Lab 07/25/21 0717  CKTOTAL 28*      Scheduled Meds:  amoxicillin-clavulanate  1 tablet Oral Q12H   docusate sodium  100 mg Oral BID   feeding supplement  237 mL Oral BID BM   gabapentin  400 mg Oral TID   methadone  50 mg Oral Daily   mineral oil light   Topical Once   mirtazapine  30 mg Oral QHS   multivitamin with minerals  1 tablet Oral Daily   nicotine  7 mg Transdermal Daily   nicotine polacrilex  4 mg Oral Q4H while awake   sertraline  200 mg Oral Daily   traZODone  100 mg Oral QHS     Principal Problem:   Sepsis ([place]) Active Problems:   Heroin use disorder, moderate, dependence (HCC)   Anemia   Cellulitis   Hypokalemia   Hyponatremia   Tobacco abuse   Protein-calorie malnutrition, severe   Malnutrition of moderate degree   Abscess of right arm   Muscle strain   Consultants: Orthopedic  team Plastic surgery Infectious disease  Procedures: Intraoperative IND with wound VAC placement of RUE on 1/5 and 1/8 Multiple procedures to right arm with ACELL graft followed by wound VAC 1/24 STSG right upper arm Echocardiogram  Antibiotics: Zosyn 1/4 through 1/9 Vancomycin 1/4 through 1/7 Cefazolin x1 dose 1/5 Clindamycin 1/5 through 1/7 Unasyn 1/9 >>   Time spent: 25 minutes    Erin Hearing ANP  [facility] 7 am - 330 pm/M-F for direct patient care and secure chat Please refer to Amion for contact info 21  days

## 2021-07-30 NOTE — Consult Note (Signed)
[facility] Nurse Consult Note: Reason for Consult: Paged by bedside RN that the [facility] on the patients upper right arm was not on when she walked into the room. She found the machine plugged into the bed. Unfortunately the power to the beds are not strong enough to keep the vac machines charged up. RN plugged into the wall, the machine came back on but was giving an error. This nurse turned off the machine and resumed treatment and immediate suction was achieved at 125 mmHg. Patient states he does not know how long the machine was off.   Cathlean Marseilles Tamala Julian, MSN, RN, [place], Lysle Pearl, [facility] Wound Treatment Associate Pager [phone]

## 2021-07-31 ENCOUNTER — Inpatient Hospital Stay (HOSPITAL_COMMUNITY): Payer: BC Managed Care – PPO

## 2021-07-31 DIAGNOSIS — T148XXA Other injury of unspecified body region, initial encounter: Secondary | ICD-10-CM | POA: Diagnosis not present

## 2021-07-31 DIAGNOSIS — R9389 Abnormal findings on diagnostic imaging of other specified body structures: Secondary | ICD-10-CM

## 2021-07-31 DIAGNOSIS — L02413 Cutaneous abscess of right upper limb: Secondary | ICD-10-CM | POA: Diagnosis not present

## 2021-07-31 DIAGNOSIS — F1120 Opioid dependence, uncomplicated: Secondary | ICD-10-CM | POA: Diagnosis not present

## 2021-07-31 LAB — CBC WITH DIFFERENTIAL/PLATELET
Abs Immature Granulocytes: 0.05 10*3/uL (ref 0.00–0.07)
Basophils Absolute: 0.1 10*3/uL (ref 0.0–0.1)
Basophils Relative: 1 %
Eosinophils Absolute: 0.6 10*3/uL — ABNORMAL HIGH (ref 0.0–0.5)
Eosinophils Relative: 6 %
HCT: 37.3 % — ABNORMAL LOW (ref 39.0–52.0)
Hemoglobin: 11.5 g/dL — ABNORMAL LOW (ref 13.0–17.0)
Immature Granulocytes: 0 %
Lymphocytes Relative: 56 %
Lymphs Abs: 6.3 10*3/uL — ABNORMAL HIGH (ref 0.7–4.0)
MCH: 26.6 pg (ref 26.0–34.0)
MCHC: 30.8 g/dL (ref 30.0–36.0)
MCV: 86.1 fL (ref 80.0–100.0)
Monocytes Absolute: 0.9 10*3/uL (ref 0.1–1.0)
Monocytes Relative: 8 %
Neutro Abs: 3.3 10*3/uL (ref 1.7–7.7)
Neutrophils Relative %: 29 %
Platelets: 416 10*3/uL — ABNORMAL HIGH (ref 150–400)
RBC: 4.33 MIL/uL (ref 4.22–5.81)
RDW: 19.8 % — ABNORMAL HIGH (ref 11.5–15.5)
WBC: 11.1 10*3/uL — ABNORMAL HIGH (ref 4.0–10.5)
nRBC: 0 % (ref 0.0–0.2)

## 2021-07-31 LAB — COMPREHENSIVE METABOLIC PANEL
ALT: 52 U/L — ABNORMAL HIGH (ref 0–44)
AST: 55 U/L — ABNORMAL HIGH (ref 15–41)
Albumin: 3 g/dL — ABNORMAL LOW (ref 3.5–5.0)
Alkaline Phosphatase: 121 U/L (ref 38–126)
Anion gap: 11 (ref 5–15)
BUN: 16 mg/dL (ref 6–20)
CO2: 23 mmol/L (ref 22–32)
Calcium: 9.1 mg/dL (ref 8.9–10.3)
Chloride: 104 mmol/L (ref 98–111)
Creatinine, Ser: 0.55 mg/dL — ABNORMAL LOW (ref 0.61–1.24)
GFR, Estimated: >60 mL/min (ref >60)
Glucose, Bld: 115 mg/dL — ABNORMAL HIGH (ref 70–99)
Potassium: 3.9 mmol/L (ref 3.5–5.1)
Sodium: 138 mmol/L (ref 135–145)
Total Bilirubin: 0.3 mg/dL (ref 0.3–1.2)
Total Protein: 7.2 g/dL (ref 6.5–8.1)

## 2021-07-31 MED ORDER — IOHEXOL 300 MG/ML  SOLN
100.0000 mL | Freq: Once | INTRAMUSCULAR | Status: AC | PRN
  Administered 2021-07-31: 100 mL via INTRAVENOUS

## 2021-07-31 NOTE — Progress Notes (Signed)
[facility]  Date of Admission:  07/09/2021      Abx: 1/22-c amox-clav Prior to that iv abx  ASSESSMENT: Group f strep nec fasc S/p skin graft  Exam with well appearing right thigh where the harvested graft is. No evidence of abscess or cellulitis  Given extensive I&D, reasonable to continue 10 days of antibiotics starting today  PLAN: Continue amox-clav for 10 more days until 2/05 No need for outpatient ID f/u Will sign off Discussed with primary team  I spent more than 35 minute reviewing data/chart, and coordinating care and >50% direct face to face time providing counseling/discussing diagnostics/treatment plan with patient   Principal Problem:   Sepsis (HCC) Active Problems:   Heroin use disorder, moderate, dependence (HCC)   Anemia   Cellulitis   Hypokalemia   Hyponatremia   Tobacco abuse   Protein-calorie malnutrition, severe   Malnutrition of moderate degree   Abscess of right arm   Muscle strain   Abnormal CT scan   No Known Allergies  Scheduled Meds:  amoxicillin-clavulanate  1 tablet Oral Q12H   celecoxib  100 mg Oral BID   docusate sodium  100 mg Oral BID   feeding supplement  237 mL Oral BID BM   gabapentin  400 mg Oral TID   methadone  50 mg Oral Daily   mineral oil light   Topical Once   mirtazapine  30 mg Oral QHS   multivitamin with minerals  1 tablet Oral Daily   nicotine  7 mg Transdermal Daily   nicotine polacrilex  4 mg Oral Q4H while awake   sertraline  200 mg Oral Daily   traZODone  100 mg Oral QHS   Continuous Infusions: PRN Meds:.acetaminophen **OR** acetaminophen, albuterol, alum & mag hydroxide-simeth, hydrALAZINE, HYDROmorphone (DILAUDID) injection, hydrOXYzine, loperamide, ondansetron **OR** ondansetron (ZOFRAN) IV, oxyCODONE   SUBJECTIVE: Patient ivdu with group f nec fasc right arm admitted 1/4 Has since undergone several I&D and recently skin graft (1/24)  Transitioned from iv  amp/sulbactam to amox-clav 1/22  Primary team concerned for ct finding femur fat stranding and asking for case review  Patient without f/c Feels well Eating well    Review of Systems: ROS All other ROS was negative, except mentioned above     OBJECTIVE: Vitals:   07/30/21 0758 07/30/21 1500 07/30/21 2037 07/31/21 0337  BP: 121/66 124/74 129/74 120/65  Pulse: 88 98 96 90  Resp: 18 19 17 17   Temp: 98.3 F (36.8 C) 98.5 F (36.9 C) (!) 97.5 F (36.4 C) (!) 97.5 F (36.4 C)  TempSrc:  Oral  Oral  SpO2: 97% 98% 97% 97%  Weight:      Height:       Body mass index is 24.86 kg/m.  Physical Exam  General/constitutional: no distress, pleasant HEENT: Normocephalic, PER, Conj Clear, EOMI, Oropharynx clear Neck supple CV: rrr no mrg Lungs: clear to auscultation, normal respiratory effort Abd: Soft, Nontender Ext: no edema  Neuro: nonfocal MSK/skin: right upper ext no cellulitic change; there is wound vac at the arm; right anterior thigh harvested graft site slight serosanguinous oozing and no cellulitic change/induration   Lab Results Lab Results  Component Value Date   WBC 11.1 (H) 07/31/2021   HGB 11.5 (L) 07/31/2021   HCT 37.3 (L) 07/31/2021   MCV 86.1 07/31/2021   PLT 416 (H) 07/31/2021    Lab Results  Component Value Date   CREATININE 0.55 (L)  07/31/2021   BUN 16 07/31/2021   NA 138 07/31/2021   K 3.9 07/31/2021   CL 104 07/31/2021   CO2 23 07/31/2021    Lab Results  Component Value Date   ALT 52 (H) 07/31/2021   AST 55 (H) 07/31/2021   ALKPHOS 121 07/31/2021   BILITOT 0.3 07/31/2021      Microbiology: No results found for this or any previous visit (from the past 240 hour(s)).   Serology:   Imaging: If present, new imagings (plain films, ct scans, and mri) have been personally visualized and interpreted; radiology reports have been reviewed. Decision making incorporated into the Impression / Recommendations.  1/25 ct right femur Fat  stranding right anterior thigh laterally  Raymondo Band, MD [facility] [phone] pager    07/31/2021, 1:23 PM

## 2021-07-31 NOTE — Progress Notes (Signed)
Physical Therapy Treatment Patient Details Name: Justin Fisher MRN: [id number] DOB: 1985-07-27 Today's Date: 07/31/2021   History of Present Illness Patient is a 36 y/o male admitted on 07/07/21 with RUE swelling, pain and foul smelling liquid draining after injecting IV druge. Found to have sepsis secondary to RLE cellulitis/neck fasciitis/abscess/right humeral osteomyelitis and positive group F Strep. s/p I&D with wound vac placement 1/5 and 07/13/21. s/p debridement again 07/22/21. PMH includes IVDU (heroin, fentanyl and meth), anxiety, suicidal ideation and tobacco use disorder.    PT Comments    Pt is walking significant distances with mobility techs and so PT focused session on his restrictions of ROM on shoulder and neck.  Talked with patient about gently moving R shoulder, as he is aware of permission but quite fearful due to presence of the vac.  Instructed pt in safe ROM for RUE assisting LUE to RUE, and ROM to neck with holding within a comfortable range.  Pt was more comfortable with the idea once his session ended, and encouraged him to work on the gentle assisted movement on his own as well as during PT.  Follow along with him to work toward outpatient therapy follow up, and remind pt that he is avoiding contractures with safe gentle movement.  Recommendations for follow up therapy are one component of a multi-disciplinary discharge planning process, led by the attending physician.  Recommendations may be updated based on patient status, additional functional criteria and insurance authorization.  Follow Up Recommendations  Outpatient PT     Assistance Recommended at Discharge PRN  Patient can return home with the following Assistance with cooking/housework   Equipment Recommendations  None recommended by PT    Recommendations for Other Services       Precautions / Restrictions Precautions Precautions: Other (comment) Precaution Comments: wound vac, groin  pain Restrictions Weight Bearing Restrictions: No Other Position/Activity Restrictions: permitted ROM to R shoulder     Mobility  Bed Mobility Overal bed mobility: Needs Assistance Bed Mobility: Supine to Sit, Sit to Supine     Supine to sit: Min guard Sit to supine: Min guard        Transfers                   General transfer comment: worked on exercises this session    Ambulation/Gait                   Social research officer, government [employer] (Stroke Patients Only)       Balance Overall balance assessment: Modified Independent                                          Cognition Arousal/Alertness: Awake/alert Behavior During Therapy: Anxious Overall Cognitive Status: Within Functional Limits for tasks assessed                                 General Comments: pt became more accepting of tx with instructions for helping himself wtih esp neck ROM        Exercises General Exercises - Upper Extremity Shoulder Flexion: AAROM, 10 reps Shoulder ABduction: AAROM, 10 reps Elbow Flexion: AAROM, 10 reps Other Exercises Other Exercises: ROM to neck SB and rotation  General Comments        Pertinent Vitals/Pain Pain Assessment Pain Assessment: Faces Faces Pain Scale: Hurts even more Pain Location: right groin with movement, RUE with AROM Pain Descriptors / Indicators: Discomfort, Grimacing, Guarding Pain Intervention(s): Limited activity within patient's tolerance, Monitored during session, Premedicated before session, Repositioned    Home Living                          Prior Function            PT Goals (current goals can now be found in the care plan section) Acute Rehab PT Goals Patient Stated Goal: to move at will again and reduce pain Progress towards PT goals: Progressing toward goals    Frequency    Min 2X/week      PT Plan Current plan remains  appropriate    Co-evaluation              AM-PAC PT "6 Clicks" Mobility   Outcome Measure  Help needed turning from your back to your side while in a flat bed without using bedrails?: A Little Help needed moving from lying on your back to sitting on the side of a flat bed without using bedrails?: A Little Help needed moving to and from a bed to a chair (including a wheelchair)?: None Help needed standing up from a chair using your arms (e.g., wheelchair or bedside chair)?: None Help needed to walk in hospital room?: None Help needed climbing 3-5 steps with a railing? : A Little 6 Click Score: 21    End of Session   Activity Tolerance: Patient tolerated treatment well;Patient limited by pain Patient left: in bed;with call bell/phone within reach Nurse Communication: Mobility status PT Visit Diagnosis: Pain Pain - Right/Left: Right Pain - part of body: Shoulder     Time: 6599-3570 PT Time Calculation (min) (ACUTE ONLY): 27 min  Charges:  $Therapeutic Exercise: 23-37 mins   Ivar Drape 07/31/2021, 7:44 PM  Samul Dada, PT PhD Acute Rehab Dept. Number: [facility] [id number] and [facility] [phone]

## 2021-07-31 NOTE — Plan of Care (Signed)

## 2021-07-31 NOTE — Progress Notes (Signed)
[facility] PROGRESS NOTE  Justin Fisher [id number] DOB: 03-07-1986 DOA: 07/09/2021 PCP: Patient, No Pcp Per (Inactive)  Status: Remains inpatient appropriate because:  Unsafe discharge plan noting requires IV antibiotics for current medical condition history of IVD use not a candidate for home IV therapy; also requiring frequent reevaluation by surgical team regarding wound on upper right arm that is also requiring wound VAC therapy  Barriers to discharge: Social:Known history of active IV drug abuse prior to admission  Clinical: Continues to require IV antibiotics and regular wound therapy for wound upper extremity.  May require additional surgical procedures including plastic surgery  Wound pics since admit:                          Level of care:  Med-Surg   Code Status: Full Family Communication: Patient only DVT prophylaxis: SCDs COVID vaccination status: Unknown  HPI: 36 year old M with PMH of IVDU (heroin, fentanyl and meth), anxiety, HLD, suicidal ideation and tobacco use disorder presenting to ED on 07/09/2021 with progressive severe right upper extremity pain, swelling, oozing brown and foul-smelling liquid after injecting IV drugs using a needle with blood in it, and admitted for sepsis due to right upper arm cellulitis/neck fasciitis/abscess/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 orthopedic surgery and ID consulted.  Initially started on vancomycin, Zosyn and clindamycin.  He underwent I&D with VAC placement on 1/5 and 1/8.  Blood cultures NGTD.  Surgical culture with group F Streptococcus.  Antibiotics de-escalated to IV Zosyn.  ID, orthopedic surgery and plastic surgery following.   Hospital course complicated by acute blood loss anemia related to surgery.  He had about 2 L blood transfused from 1/8-1/9.  Subjective: Awake.  Stated initial labs were unable to be obtained due to poor venous access but lab apparently sending another phlebotomist  to the bedside.  His primary concern at this time is of blood oozing from right lateral thigh STSG dressing.  Continues to have right medial groin pain  Objective: Vitals:   07/30/21 2037 07/31/21 0337  BP: 129/74 120/65  Pulse: 96 90  Resp: 17 17  Temp: (!) 97.5 F (36.4 C) (!) 97.5 F (36.4 C)  SpO2: 97% 97%    Intake/Output Summary (Last 24 hours) at 07/31/2021 1214 Last data filed at 07/31/2021 0700 Gross per 24 hour  Intake --  Output 1000 ml  Net -1000 ml   Filed Weights   07/10/21 2308 07/29/21 1408  Weight: 73.4 kg 72 kg    Exam:  Constitutional: No acute distress Respiratory: clear to auscultation bilaterally, no wheezing, no crackles. Normal respiratory effort.  RA Cardiovascular: Regular pulse, S1S2, No lower extremity edema.  Abdomen: no tenderness, no masses palpated. Bowel sounds positive. LBM 1/23 Skin: Wound VAC over upper right arm over surgical wound; dressing over right lateral thigh post STSG oozing small amounts of serosanguineous fluid Neurologic: CN 2-12 grossly intact. Sensation intact, Strength 5/5 x all 4 extremities.  Psychiatric: Awake and oriented x3.    Assessment/Plan: Acute problems: Right upper arm cellulitis/neck fasciitis/acute osteomyelitis and patient with history of IVDU/s/p ACELL graft placement 1/13 -Superficial wound culture positive for moderate strep group F with intraoperative surgical culture from 1/5 with moderate strep group F; blood cultures negative-as of 1/20 WBC has normalized -Orthopedic surgery, infectious disease and plastic surgery following. -IV Vanco, Zosyn and clinda>> IV Zosyn. Completed IV Unasyn 07/26/2021.  Continue Augmentin until 08/23/2021 -Status post skin graft on 1/24.  Anticipate can discharge  home after current wound VAC discontinued -Continue RUE PT   Polysubstance use/IVDU including heroin, cocaine, fentanyl, meth and tobacco -UDS positive for cocaine on admission.  HIV nonreactive.  Hepatitis C antibody  positive. -Continue nicotine gum but adjust schedule to every 4 hours while awake and add a low-dose nicotine patch 7 mg/day -Hep B vaccine -Outpatient follow-up with [facility] upon discharge.  1/24 methadone increased to 50 mg   Mild transaminitis -Nonobstructive pattern: AST 39> 61 > 74 > 85 > 55 ALT 29 > 48 > 55 > 72 > 52 -Multiple potential medication sources: Tylenol, recent Unasyn, current Augmentin and NSAIDs. -1/26 LFTs appear to be trending downward.  We will watch cautiously on Tylenol and Unasyn with plan to recheck again either Sunday 1/29 or Monday 1/30 given the fact patient is a difficult stick  Muscle strain -Continue low-dose ibuprofen 400 mg 3 times daily x5 days -CK 28 -Add K pad on 1/23 and ask physical therapy to assist -CT leg w/ contrast to r/o myositis questions stranding and possible cellulitis right lateral thigh but this is also in the same area as STSG.  Hopeful this is just expected inflammatory change postoperatively but have asked ID to review. -WBC today 11.1 without left shift; he remains afebrile   Acute blood loss anemia superimposed on chronic iron deficiency anemia -Hgb 15.5 in 2018.   -s/p 5 units of PRBCs this admission -Current hemoglobin 10.7  Elevated blood pressure without diagnosis of hypertension -Not on medications at home and blood pressure has improved with treatment of pain   Hyponatremia:  Resolved   Hypokalemia:  Resolved   History of anxiety/depression:  Stable. -On Vistaril at home -frequency increased this hospitalization to every 6 hours as needed -Anxiety has improved with the addition of methadone as well   Hepatitis C antibody positive -Follow quantitative RNA -Recommend follow-up at ID clinic after discharge   Data Reviewed: Basic Metabolic Panel: Recent Labs  Lab 07/25/21 0717 07/29/21 0819 07/31/21 1014  NA 138 135 138  K 5.0 6.1* 3.9  CL 104 101 104  CO2 23 23 23   GLUCOSE 101* 102* 115*  BUN 21* 14 16   CREATININE 0.55* 0.61 0.55*  CALCIUM 9.2 9.3 9.1   Liver Function Tests: Recent Labs  Lab 07/25/21 0717 07/29/21 0819 07/31/21 1014  AST 74* 85* 55*  ALT 55* 72* 52*  ALKPHOS 133* 109 121  BILITOT 0.4 1.2 0.3  PROT 7.0 8.1 7.2  ALBUMIN 3.1* 3.5 3.0*    CBC: Recent Labs  Lab 07/31/21 1014  WBC 11.1*  NEUTROABS 3.3  HGB 11.5*  HCT 37.3*  MCV 86.1  PLT 416*    Cardiac Enzymes: Recent Labs  Lab 07/25/21 0717  CKTOTAL 28*      Scheduled Meds:  amoxicillin-clavulanate  1 tablet Oral Q12H   celecoxib  100 mg Oral BID   docusate sodium  100 mg Oral BID   feeding supplement  237 mL Oral BID BM   gabapentin  400 mg Oral TID   methadone  50 mg Oral Daily   mineral oil light   Topical Once   mirtazapine  30 mg Oral QHS   multivitamin with minerals  1 tablet Oral Daily   nicotine  7 mg Transdermal Daily   nicotine polacrilex  4 mg Oral Q4H while awake   sertraline  200 mg Oral Daily   traZODone  100 mg Oral QHS     Principal Problem:   Sepsis ([place]) Active Problems:  Heroin use disorder, moderate, dependence (HCC)   Anemia   Cellulitis   Hypokalemia   Hyponatremia   Tobacco abuse   Protein-calorie malnutrition, severe   Malnutrition of moderate degree   Abscess of right arm   Muscle strain   Consultants: Orthopedic team Plastic surgery Infectious disease  Procedures: Intraoperative IND with wound VAC placement of RUE on 1/5 and 1/8 Multiple procedures to right arm with ACELL graft followed by wound VAC 1/24 STSG right upper arm Echocardiogram  Antibiotics: Zosyn 1/4 through 1/9 Vancomycin 1/4 through 1/7 Cefazolin x1 dose 1/5 Clindamycin 1/5 through 1/7 Unasyn 1/9 >>   Time spent: 25 minutes    Erin Hearing ANP  [facility] 7 am - 330 pm/M-F for direct patient care and secure chat Please refer to Amion for contact info 22  days

## 2021-07-31 NOTE — TOC Progression Note (Signed)
Transition of Care [facility]) - Progression Note    Patient Details  Name: Justin Fisher MRN: [id number] Date of Birth: Oct 24, 1985  Transition of Care [facility]) CM/SW [place], RN Phone Number: 07/31/2021, 11:49 AM  Clinical Narrative:    CM met with the patient at the bedside regarding transitions of care to home once the patient's wound vac is discontinued or need for complex dressing changes are resolved.  TOC Team was unable to establish home health services for the patient due to his past history of IV drug use and unsafe home environment.  The patient does not have available/reliable friends or family in the area  to assist with wound care at home relating to the wound vac.  The patient will need follow up at Plastic Surgery or orthopedic surgery follow up relating to the Right arm wound care needs.  The patient received a new Right arm skin graft on Tuesday, 07/29/21 by Dr. Erin Hearing - and Right thigh graft site noted.  The patient will need appropriate dressing supplies for home once he is closer to discharging to home - Lissa Merlin, NP is aware and bedside nursing will be alerted once the patient is medically stable to discharge to home at a later date.  Message was sent to Dr. Erin Hearing to determine when wound vac would be removed at a later date to assist with discharge planning to home accordingly.  CM called and established a PCP follow up with [facility] for next appointment available for Tuesday, 08/19/2021 at 1430 - noted in the follow up instructions.  CM and MSW with DTP Team continue to follow the patient for discharge planning to home once the wound vac is discontinued or changed to non-complex dressing changes.     Expected Discharge Plan: Home/Self Care Barriers to Discharge: Continued Medical Work up  Expected Discharge Plan and Services Expected Discharge Plan: Home/Self Care In-house Referral: Clinical Social Work Discharge Planning Services: CM  Consult Post Acute Care Choice: Durable Medical Equipment (Patient has a [place] at the present - TOC will follow for wound vac needs at discharge if needed per Surgery MD/Plastics MD) Living arrangements for the past 2 months: Single Family Home                                       Social Determinants of Health (SDOH) Interventions    Readmission Risk Interventions Readmission Risk Prevention Plan 07/17/2021  Transportation Screening Complete  PCP or Specialist Appt within 5-7 Days Complete  Home Care Screening Complete  Medication Review (RN CM) Complete  Some recent data might be hidden

## 2021-08-01 ENCOUNTER — Other Ambulatory Visit (HOSPITAL_COMMUNITY): Payer: Self-pay

## 2021-08-01 DIAGNOSIS — T148XXA Other injury of unspecified body region, initial encounter: Secondary | ICD-10-CM | POA: Diagnosis not present

## 2021-08-01 DIAGNOSIS — L03113 Cellulitis of right upper limb: Secondary | ICD-10-CM

## 2021-08-01 DIAGNOSIS — R7401 Elevation of levels of liver transaminase levels: Secondary | ICD-10-CM | POA: Diagnosis not present

## 2021-08-01 DIAGNOSIS — L02413 Cutaneous abscess of right upper limb: Secondary | ICD-10-CM | POA: Diagnosis not present

## 2021-08-01 DIAGNOSIS — F1120 Opioid dependence, uncomplicated: Secondary | ICD-10-CM | POA: Diagnosis not present

## 2021-08-01 MED ORDER — GABAPENTIN 400 MG PO CAPS: 400.0000 mg | ORAL_CAPSULE | Freq: Three times a day (TID) | ORAL | 0 refills | Status: DC

## 2021-08-01 MED ORDER — METHADONE HCL 10 MG PO TABS
55.0000 mg | ORAL_TABLET | Freq: Every day | ORAL | Status: DC
  Administered 2021-08-02 – 2021-08-05 (×4): 55 mg via ORAL
  Filled 2021-08-01 (×4): qty 6

## 2021-08-01 MED ORDER — METHADONE HCL 5 MG PO TABS: 55.0000 mg | ORAL_TABLET | Freq: Every day | ORAL | 0 refills | Status: DC

## 2021-08-01 MED ORDER — HYDROXYZINE HCL 50 MG PO TABS: 50.0000 mg | ORAL_TABLET | Freq: Four times a day (QID) | ORAL | 0 refills | Status: DC | PRN

## 2021-08-01 MED ORDER — METHADONE HCL 10 MG PO TABS: 50.0000 mg | ORAL_TABLET | Freq: Every day | ORAL | 0 refills | Status: DC

## 2021-08-01 MED ORDER — AMOXICILLIN-POT CLAVULANATE 875-125 MG PO TABS
1.0000 | ORAL_TABLET | Freq: Two times a day (BID) | ORAL | 0 refills | Status: DC
Start: 2021-08-01 — End: 2023-12-06
  Filled 2021-08-01: qty 44, 22d supply, fill #0

## 2021-08-01 MED ORDER — MIRTAZAPINE 30 MG PO TABS
30.0000 mg | ORAL_TABLET | Freq: Every day | ORAL | 0 refills | Status: DC
  Filled 2021-08-01: qty 30, 30d supply, fill #0

## 2021-08-01 MED ORDER — SERTRALINE HCL 100 MG PO TABS
200.0000 mg | ORAL_TABLET | Freq: Every day | ORAL | 0 refills | Status: DC
  Filled 2021-08-01: qty 60, 30d supply, fill #0

## 2021-08-01 MED ORDER — METHOCARBAMOL 750 MG PO TABS
750.0000 mg | ORAL_TABLET | Freq: Three times a day (TID) | ORAL | Status: DC | PRN
  Administered 2021-08-01 – 2021-08-03 (×4): 750 mg via ORAL
  Filled 2021-08-01 (×4): qty 1

## 2021-08-01 MED ORDER — ACETAMINOPHEN 325 MG PO TABS: 650.0000 mg | ORAL_TABLET | Freq: Four times a day (QID) | ORAL | Status: AC | PRN

## 2021-08-01 MED ORDER — METHOCARBAMOL 750 MG PO TABS
750.0000 mg | ORAL_TABLET | Freq: Three times a day (TID) | ORAL | 0 refills | Status: DC | PRN
  Filled 2021-08-01: qty 90, 30d supply, fill #0

## 2021-08-01 MED ORDER — NICOTINE POLACRILEX 2 MG MT GUM
2.0000 mg | CHEWING_GUM | OROMUCOSAL | 0 refills | Status: DC | PRN
  Filled 2021-08-01: qty 100, fill #0

## 2021-08-01 MED ORDER — TRAZODONE HCL 100 MG PO TABS: 100.0000 mg | ORAL_TABLET | Freq: Every evening | ORAL | 0 refills | Status: DC | PRN

## 2021-08-01 MED ORDER — CELECOXIB 100 MG PO CAPS
100.0000 mg | ORAL_CAPSULE | Freq: Two times a day (BID) | ORAL | 0 refills | Status: DC
  Filled 2021-08-01: qty 60, 30d supply, fill #0

## 2021-08-01 MED ORDER — NICOTINE POLACRILEX 4 MG MT GUM
4.0000 mg | CHEWING_GUM | OROMUCOSAL | 0 refills | Status: DC
  Filled 2021-08-01: qty 100, 20d supply, fill #0

## 2021-08-01 NOTE — TOC Progression Note (Addendum)
Transition of Care [facility]) - Progression Note    Patient Details  Name: Justin Fisher MRN: [id number] Date of Birth: Apr 14, 1986  Transition of Care [facility]) CM/SW Contact  Janae Bridgeman, RN Phone Number: 08/01/2021, 8:32 AM  Clinical Narrative:    CM called and spoke with Jonathon Bellows, CM at at [facility] and he was updated that the patient would likely discharge home with roommates on Sunday, 08/03/2021 or Monday 08/04/2021 once the wound vac is removed by Plastic surgery MD, Dr. Domenica Reamer.  Patient is unable to discharge home with wound vac since he does not have caregiver support for dressing changes and does not have home health availability. The patient will need dressing change supplies for home.  PCP follow up scheduled in discharge instructions.   Home health services are unavailable for this patient due to patient's history of IV drug use.   ADS will be sent discharge clinicals to secure fax at the facility for continued methadone dosing.  Discharge medications will be provided through [facility] pharmacy and will be delivered to the nursing station to hold in case the patient is able to discharge to home on Sunday.  Guest dosing form signed by physician and guest dose and clinicals were faxed to Jonathon Bellows to fax # [phone].  CM and MSW with DTP Team will continue to follow the patient for discharge planning.   Expected Discharge Plan: Home/Self Care Barriers to Discharge: Continued Medical Work up  Expected Discharge Plan and Services Expected Discharge Plan: Home/Self Care In-house Referral: Clinical Social Work Discharge Planning Services: CM Consult Post Acute Care Choice: Durable Medical Equipment (Patient has a [place] at the present - TOC will follow for wound vac needs at discharge if needed per Surgery MD/Plastics MD) Living arrangements for the past 2 months: Single Family Home                                       Social Determinants of Health (SDOH)  Interventions    Readmission Risk Interventions Readmission Risk Prevention Plan 07/17/2021  Transportation Screening Complete  PCP or Specialist Appt within 5-7 Days Complete  Home Care Screening Complete  Medication Review (RN CM) Complete  Some recent data might be hidden

## 2021-08-01 NOTE — Discharge Summary (Addendum)
Physician Discharge Summary  Justin Fisher ZOX:[id number] DOB: 1985-09-13 DOA: 07/09/2021  PCP: Patient, No Pcp Per (Inactive)  Admit date: 07/09/2021 Discharge date: 08/05/2021  Time spent: 35 minutes  Recommendations for Outpatient Follow-up:  Patient will discharge home accompanied by his father Medications sent to [facility].  No refills on these medications.  Patient will need to follow-up with new PCP obtain refill Temporary for methadone orders faxed to [facility] per their required form.  Discharge dose will be 55 mg.  Please contact Justin Fisher at [facility] to confirm follow-up appointment date and time Patient has appointment scheduled with new PCP Justin Fisher on 2/14 at 2:30 PM Patient needs to follow-up with plastic surgery physician Justin Fisher had previously been instructed by the doctor to present to the office on Friday, February 3 to obtain any needed wound care materials.  He was asked to call the office 24 hours in advance to ensure these materials have been gathered by the staff and will be ready for him Patient needs to contact the outpatient rehabilitation center on [facility] to schedule outpatient therapy. Patient will need to continue wound care to right upper extremity as well as to right thigh graft site.  At time of dictation wound VAC is yet to be removed and plastic surgery had not yet written post VAC wound care order.He has been prescribed MSIR 15 mg tabs q 4 hours prn primarily for dressing changes. 7 day supply only! Patient had transaminitis during this hospitalization secondary to several medications.  LFTs 1/29: AST 99, ALT 76 which is subtle upward trend. 1/31 trend was downward AST 78 and ALT 75. Patient will continue on Celebrex which can also increase LFTs.  Recommend LFTs be followed after discharge.  This can be done either through PCP or through [facility] physician. If his numbers continue to rise Celebrex will need to be dc'd    Wound pics since admit:                                 Discharge Diagnoses:  Principal Problem:   Sepsis (HCC) Active Problems:   Heroin use disorder, moderate, dependence (HCC)   Anemia   Cellulitis   Hypokalemia   Hyponatremia   Tobacco abuse   Protein-calorie malnutrition, severe   Malnutrition of moderate degree   Abscess of right arm   Muscle strain   Abnormal CT scan   Cellulitis of right arm   Transaminitis  SEPSIS RESOLVED  Discharge Condition: Stable  Diet recommendation: Regular  Filed Weights   07/10/21 2308 07/29/21 1408  Weight: 73.4 kg 72 kg    History of present illness:  36 year old M with PMH of IVDU (heroin, fentanyl and meth), anxiety, HLD, suicidal ideation and tobacco use disorder presenting to ED on 07/09/2021 with progressive severe right upper extremity pain, swelling, oozing brown and foul-smelling liquid after injecting IV drugs using a needle with blood in it, and admitted for sepsis due to right upper arm cellulitis/neck fasciitis/abscess/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 orthopedic surgery and ID consulted.  Initially started on vancomycin, Zosyn and clindamycin.  He underwent I&D with VAC placement on 1/5 and 1/8.  Blood cultures NGTD.  Surgical culture with group F Streptococcus.  Antibiotics de-escalated to IV Zosyn.  ID, orthopedic surgery and plastic surgery following.   Hospital course complicated by acute blood loss anemia related to surgery.  He had about 2 L blood transfused from 1/8-1/9.  Hospital Course:  Right upper arm cellulitis/neck fasciitis/acute osteomyelitis and patient with history of IVDU/s/p ACELL graft placement 1/13 -Superficial wound culture positive for moderate strep group F with intraoperative surgical culture from 1/5 with moderate strep group F; blood cultures negative-as of 1/20 WBC has normalized -Orthopedic surgery, infectious disease and plastic surgery following. -IV Vanco, Zosyn and clinda>> IV Zosyn. Completed IV Unasyn 07/26/2021.   Continue Augmentin until 08/23/2021 -Status post skin graft on 1/24.  Anticipate discharge home after current wound VAC discontinued -Resumed preadmission Robaxin   Polysubstance use/IVDU including heroin, cocaine, fentanyl, meth and tobacco -UDS positive for cocaine on admission.  HIV nonreactive.  Hepatitis C antibody positive. -Continue nicotine gum but adjust schedule to every 4 hours while awake and add a low-dose nicotine patch 7 mg/day -Hep B vaccine -Outpatient follow-up with [facility] upon discharge.  -Discharge dose for methadone will be methadone 55 mg    Mild transaminitis -Nonobstructive pattern: AST 39> 61 > 74 > 85 > 55 > 99 > 78 ALT 29 > 48 > 55 > 72 > 52 > 76 > 75 -Multiple potential medication sources: Tylenol, recent Unasyn, current Augmentin and NSAIDs. - If LFTs continue to rise recommend dc Celebrex   Muscle strain RLE -Continue Celebrex -CT leg w/ contrast without evidence of infection or other significant abnormality-ID reviewed    Acute blood loss anemia superimposed on chronic iron deficiency anemia -Hgb 15.5 in 2018.   -s/p 5 units of PRBCs this admission -Recent hemoglobin 10.7   Elevated blood pressure without diagnosis of hypertension -Not on medications at home and blood pressure has improved with treatment of pain   Hyponatremia:  Resolved   Hypokalemia:  Resolved   History of anxiety/depression:  Stable. -Continue preadmission Vistaril -Anxiety has improved with the addition of methadone as well   Hepatitis C antibody positive -Follow quantitative RNA -Recommend follow-up at ID clinic after discharge  Procedures: Intraoperative IND with wound VAC placement of RUE on 1/5 and 1/8 Multiple procedures to right arm with ACELL graft followed by wound VAC 1/24 STSG right upper arm Echocardiogram   Consultations: Orthopedic team Plastic surgery Infectious disease  Antibiotics: Zosyn 1/4 through 1/9 Vancomycin 1/4 through 1/7 Cefazolin x1  dose 1/5 Clindamycin 1/5 through 1/7 Unasyn 1/9 >> 1/21 Augmentin 1/21 with ID recommending continued through 2/18  Discharge Exam: Vitals:   08/04/21 2251 08/05/21 0801  BP:  103/89  Pulse: 97 (!) 102  Resp: 18 16  Temp:  98.5 F (36.9 C)  SpO2:  97%   Constitutional: No acute distress Respiratory: clear to auscultation bilaterally, no wheezing, no crackles. Normal respiratory effort.  RA Cardiovascular: Regular pulse, S1S2, No lower extremity edema.  Abdomen: no tenderness, no masses palpated. Bowel sounds positive. LBM 1/23 Skin: Dressing over upper right arm over surgical wound; dressing over right lateral thigh post STSG  Neurologic: CN 2-12 grossly intact. Sensation intact, Strength 5/5 x all 4 extremities.  Psychiatric: Awake and oriented x3.    Discharge Instructions   Discharge Instructions     Ambulatory referral to Physical Therapy   Complete by: As directed    Iontophoresis - 4 mg/ml of dexamethasone: No   T.E.N.S. Unit Evaluation and Dispense as Indicated: No   Call MD for:  extreme fatigue   Complete by: As directed    Call MD for:  persistant dizziness or light-headedness   Complete by: As directed    Call MD for:  redness, tenderness, or signs of infection (pain, swelling, redness, odor  or green/yellow discharge around incision site)   Complete by: As directed    Call MD for:  temperature >100.4   Complete by: As directed    Diet general   Complete by: As directed    Discharge instructions   Complete by: As directed    Please attend all physician appointments as recommended.  Some appointments have been scheduled but some you will need to call the office to schedule.  Please call Dr. Luppen's/plastic surgery office this coming Thursday, February 3 to ensure needed wound care supplies will be available for you to pick up at the office as recommended by Dr. Francisco Capuchin have been given a limited supply of MS IR morphine to use for wound care.  No refills  will be provided.  Please complete the antibiotics as recommended by the infectious disease doctor  Please keep your appointments with [facility] in order to continue receiving your methadone on a long-term basis  These refer to the wound care instructions regarding wound care of your upper arm and thigh   Discharge wound care:   Complete by: As directed    Please apply nonstick dressings to the right upper arm and right thigh wounds daily.  Cover with dry dressing (you can also substitute menstrual pads for thicker dressing material), secure basic dressing with paper tape and then wrapped with an Ace wrap.  Okay to cleanse wound with soap and water.  Please pat dry all wounds before applying dressing material.   Increase activity slowly   Complete by: As directed       Allergies as of 08/05/2021   No Known Allergies      Medication List     STOP taking these medications    hydrOXYzine 50 MG capsule Commonly known as: VISTARIL   ibuprofen 200 MG tablet Commonly known as: ADVIL   lamoTRIgine 25 MG tablet Commonly known as: LAMICTAL   meloxicam 7.5 MG tablet Commonly known as: Mobic       TAKE these medications    acetaminophen 325 MG tablet Commonly known as: TYLENOL Take 2 tablets (650 mg total) by mouth every 6 (six) hours as needed for mild pain (or Fever >/= 101). What changed:  medication strength how much to take reasons to take this   amoxicillin-clavulanate 875-125 MG tablet Commonly known as: AUGMENTIN Take 1 tablet by mouth 2 (two) times daily. Last day of therapy- 08/23/21   celecoxib 100 MG capsule Commonly known as: CELEBREX Take 1 capsule (100 mg total) by mouth 2 (two) times daily.   gabapentin 400 MG capsule Commonly known as: NEURONTIN Take 1 capsule (400 mg total) by mouth 3 (three) times daily. For agitation What changed: Another medication with the same name was removed. Continue taking this medication, and follow the directions you see here.    hydrOXYzine 50 MG tablet Commonly known as: ATARAX Take 1 tablet (50 mg total) by mouth every 6 (six) hours as needed for anxiety.   methadone 5 MG tablet Commonly known as: DOLOPHINE Take 11 tablets (55 mg total) by mouth daily.   methocarbamol 750 MG tablet Commonly known as: ROBAXIN Take 1 tablet (750 mg total) by mouth every 8 (eight) hours as needed for muscle spasms. What changed: when to take this   mirtazapine 30 MG tablet Commonly known as: REMERON Take 1 tablet (30 mg total) by mouth at bedtime. What changed:  medication strength how much to take   morphine 15 MG tablet Commonly known as:  MSIR Take 1 tablet (15 mg total) by mouth every 6 (six) hours as needed for severe pain.   nicotine polacrilex 4 MG gum Commonly known as: NICORETTE Take 1 each (4 mg total) by mouth every 4 (four) hours while awake. What changed:  medication strength how much to take when to take this reasons to take this   sertraline 100 MG tablet Commonly known as: ZOLOFT Take 2 tablets (200 mg total) by mouth daily. What changed: additional instructions   traZODone 100 MG tablet Commonly known as: DESYREL Take 1 tablet (100 mg total) by mouth at bedtime. What changed:  when to take this reasons to take this additional instructions               Discharge Care Instructions  (From admission, onward)           Start     Ordered   08/05/21 0000  Discharge wound care:       Comments: Please apply nonstick dressings to the right upper arm and right thigh wounds daily.  Cover with dry dressing (you can also substitute menstrual pads for thicker dressing material), secure basic dressing with paper tape and then wrapped with an Ace wrap.  Okay to cleanse wound with soap and water.  Please pat dry all wounds before applying dressing material.   08/05/21 1036            No Known Allergies  Follow-up Information     [facility] Schedule an appointment as soon as  possible for a visit.   Why: Please follow up at the [facility] clinic for post hospitalization outpatient substance abuse drug treatment. Contact information: [phone]        [facility] Schedule an appointment as soon as possible for a visit.   Specialty: Rehabilitation Why: [facility] will arrange follow up regarding recommended physical therapy. Contact information: [address] [id number] mc [address] [place] [address] [phone]        Grayce Sessions, NP. Go on 08/19/2021.   Specialty: Internal Medicine Why: You are scheduled for a hospital follow up at the clinic on Tuesday, August 19, 2021 at 2:30 pm. Contact information: [address] [place] [address] [phone]         Janne Napoleon, MD. Schedule an appointment as soon as possible for a visit.   Specialty: Plastic Surgery Why: Please call and follow up with the office as needed regarding wound care to right arm and leg post-surgery as instructed. Contact information: [address] [place] [address] [phone]                  The results of significant diagnostics from this hospitalization (including imaging, microbiology, ancillary and laboratory) are listed below for reference.    Significant Diagnostic Studies: DG Forearm Left  Result Date: 07/09/2021 CLINICAL DATA:  Evaluate for foreign object. EXAM: LEFT FOREARM - 2 VIEW COMPARISON:  Radiograph dated 04/15/2020. FINDINGS: There is a 6 mm linear radiopaque foreign object in the soft tissues of the distal forearm lateral to the distal third of the radius as seen on the prior radiograph. An additional 2.5 cm linear radiopaque object noted in the lateral soft tissues of the mid forearm. This is only seen on the AP view. No acute fracture or dislocation. The bones are well mineralized. No significant arthritic changes. IMPRESSION: Two linear  radiopaque foreign bodies as described. No acute fracture or dislocation. Electronically Signed  By: Elgie Collard M.D.   On: 07/09/2021 20:43   CT HUMERUS RIGHT W CONTRAST  Result Date: 07/10/2021 CLINICAL DATA:  Right upper arm soft tissue mass. Evaluate for infection. EXAM: CT OF THE UPPER RIGHT EXTREMITY WITH CONTRAST TECHNIQUE: Multidetector CT imaging of the upper right extremity was performed according to the standard protocol following intravenous contrast administration. CONTRAST:  OMNIPAQUE IOHEXOL 350 MG/ML SOLN COMPARISON:  Right humerus radiographs 07/09/2021 FINDINGS: Soft tissues There is moderate soft tissue swelling of the posterior posterolateral and lateral aspects of the right upper arm from the level of the acromion through the distal humeral diaphysis. There is also similar soft tissue swelling of the distal aspect of the posteromedial aspect of the distal right upper arm. There is extensive subcutaneous air within the subcutaneous fat throughout all these regions and there appears to be a defect within the posterior aspect of the mid to distal soft tissues at the level of the mid to distal humeral diaphysis (axial series 6, image 158). Within the posterior right upper shoulder areas also soft tissue density with mild peripheral enhancement (axial series 6 images 15 through 78) suspicious for an abscess posterior the scapula. This measures up to approximately 5.4 by 1.8 by 4.9 cm (transverse by AP by craniocaudal). Bones/Joint/Cartilage There is moderate erosion (measuring up to approximately 11 mm in depth) within the greater tuberosity. This region also measures up to proximally 2.0 cm in AP dimension and 1.7 cm in craniocaudal dimension (axial image 46, coronal image 50). This is suspicious for acute to subacute osteomyelitis. Mild glenohumeral joint space narrowing. There are multiple enlarged right axillary lymph nodes, likely reactive, measuring up to 1.5 cm in short axis  (axial series 6, image 111). No filling defect is seen within the right upper extremity vessels. No gross abnormality is seen within visualized portion of the right lung. IMPRESSION: Extensive soft tissue swelling and subcutaneous air within the predominantly posterior and lateral aspects of the right upper arm, greatest superiorly. Adjacent to this, there is cortical erosion within the humeral head greater tuberosity concerning for acute subacute osteomyelitis. Surrounding the subcutaneous air there is a rim enhancing collection posterior to the mid to lateral aspect of the scapula suspicious for an abscess, as described above. Electronically Signed   By: Neita Garnet   On: 07/10/2021 12:49   CT FOREARM RIGHT W WO CONTRAST  Result Date: 07/10/2021 CLINICAL DATA:  Soft tissue infection suspected, forearm. EXAM: CT OF THE UPPER RIGHT EXTREMITY WITHOUT AND WITH CONTRAST TECHNIQUE: Multidetector CT imaging of the upper right extremity was performed following the standard protocol before and during bolus administration of intravenous contrast. COMPARISON:  07/09/2021. CONTRAST:  68mL OMNIPAQUE IOHEXOL 300 MG/ML  SOLN FINDINGS: Bones/Joint/Cartilage No acute fracture or dislocation. There is increased lucency along the lateral aspect of the radial head and at the proximal radial and ulnar diaphysis. A small subcortical lucency is seen along the posterior aspect of the lateral epicondyle. Ligaments Suboptimally assessed by CT. Muscles and Tendons Not well delineated on exam. There is loss of normal intramuscular fat planes. Soft tissues There is diffuse subcutaneous soft tissue edema. Foci of air identified in the subcutaneous soft tissues along the ventral aspect of the distal humerus. There is a questionable rim enhancing structure adjacent to the distal humerus measuring 2.0 x 1.1 cm, axial image 6. Evaluation of the soft tissues is limited due to technique and artifact. The previously described radiopaque foreign  bodies are not definitely visualized on this  exam. IMPRESSION: 1. Examination is significantly limited due to technique and artifact. 2. The previously described radiopaque foreign bodies in the left forearm are not definitely visualized on this exam. 3. Diffuse subcutaneous and intramuscular edema and loss of normal intramuscular fat planes which may be infectious or inflammatory. Small foci of air are noted in the subcutaneous tissues adjacent to the distal humerus, concerning for infection. There is a questionable rim enhancing structure adjacent to the distal humerus measuring 2.0 x 1.1 cm, possible abscess, not well evaluated on this exam. 4. Mildly increased lucency in the radial head, proximal ulnar and radial diaphysis and distal humerus. The possibility of osteomyelitis can not be excluded. MRI or three-phase bone scan is suggested for further evaluation. Electronically Signed   By: Thornell SartoriusLaura  Taylor M.D.   On: 07/10/2021 21:33   CT FEMUR RIGHT W CONTRAST  Result Date: 07/31/2021 CLINICAL DATA:  Thigh pain EXAM: CT OF THE LOWER RIGHT EXTREMITY WITH CONTRAST TECHNIQUE: Multidetector CT imaging of the lower right extremity was performed according to the standard protocol following intravenous contrast administration. RADIATION DOSE REDUCTION: This exam was performed according to the departmental dose-optimization program which includes automated exposure control, adjustment of the mA and/or kV according to patient size and/or use of iterative reconstruction technique. CONTRAST:  100mL OMNIPAQUE IOHEXOL 300 MG/ML  SOLN COMPARISON:  None. FINDINGS: Study is limited due to beam hardening artifacts. Bones/Joint/Cartilage No acute fracture or dislocation. No suspicious bony lesions. No bone destruction or periosteal reaction. Ligaments Suboptimally assessed by CT. Muscles and Tendons Grossly normal. No muscular or fascial fluid collections identified. Soft tissues Subcutaneous fat stranding visualized along the  lateral aspect of the thigh with no associated fluid collection visualized. IMPRESSION: Subcutaneous fat stranding along the lateral aspect of the thigh, correlate for possible cellulitis. No focal fluid collection identified. Electronically Signed   By: Jannifer Hickelaney  Williams M.D.   On: 07/31/2021 08:47   DG Humerus Right  Result Date: 07/09/2021 CLINICAL DATA:  Right arm pain.  IV drug abuse. EXAM: RIGHT HUMERUS - 2+ VIEW COMPARISON:  07/07/2021 FINDINGS: Extensive gas throughout the soft tissues of the upper arm on the right has progressed in the interval. Findings compatible with extensive soft tissue infection. No fracture or osteomyelitis identified. IMPRESSION: Progression of soft tissue gas throughout the right upper arm compatible with soft tissue infection. No skeletal abnormality. Electronically Signed   By: Marlan Palauharles  Clark M.D.   On: 07/09/2021 18:32   DG Humerus Right  Result Date: 07/07/2021 CLINICAL DATA:  Septic arm. EXAM: RIGHT HUMERUS - 2+ VIEW COMPARISON:  None. FINDINGS: There is no evidence of fracture or other focal bone lesions. Large amount soft tissue gas is noted consistent with cellulitis. No lytic destruction is seen to suggest osteomyelitis. IMPRESSION: Large amount of soft tissue gas seen involving the upper arm concerning for cellulitis. No definite bony abnormality is noted. Electronically Signed   By: Lupita RaiderJames  Green Jr M.D.   On: 07/07/2021 15:34   ECHOCARDIOGRAM COMPLETE  Result Date: 07/10/2021    ECHOCARDIOGRAM REPORT   Patient Name:   Justin Fisher Date of Exam: 07/10/2021 Medical Rec #:  [id number]       Height:       67.0 in Accession #:    [id number]      Weight:       185.0 lb Date of Birth:  Jul 24, 1985      BSA:          1.956 m Patient Age:  35 years        BP:           144/92 mmHg Patient Gender: M               HR:           68 bpm. Exam Location:  Inpatient Procedure: 2D Echo, Cardiac Doppler and Color Doppler Indications:    Murmur  History:        Patient has no  prior history of Echocardiogram examinations.                 Sepsis. IVDU.  Sonographer:    Roosvelt Maser RDCS Referring Phys: [id number] ANASTASSIA DOUTOVA IMPRESSIONS  1. No obvious vegetations Would recomm TEE if clinically indicated to r/o endocarditis.  2. Left ventricular ejection fraction, by estimation, is 55 to 60%. The left ventricle has normal function. The left ventricle has no regional wall motion abnormalities. Left ventricular diastolic parameters were normal.  3. Right ventricular systolic function is normal. The right ventricular size is normal. There is normal pulmonary artery systolic pressure.  4. Left atrial size was mildly dilated.  5. The mitral valve is normal in structure. Trivial mitral valve regurgitation.  6. The aortic valve is normal in structure. Aortic valve regurgitation is not visualized.  7. The inferior vena cava is normal in size with greater than 50% respiratory variability, suggesting right atrial pressure of 3 mmHg. FINDINGS  Left Ventricle: Left ventricular ejection fraction, by estimation, is 55 to 60%. The left ventricle has normal function. The left ventricle has no regional wall motion abnormalities. The left ventricular internal cavity size was normal in size. There is  no left ventricular hypertrophy. Left ventricular diastolic parameters were normal. Right Ventricle: The right ventricular size is normal. Right vetricular wall thickness was not assessed. Right ventricular systolic function is normal. There is normal pulmonary artery systolic pressure. The tricuspid regurgitant velocity is 2.86 m/s, and with an assumed right atrial pressure of 3 mmHg, the estimated right ventricular systolic pressure is 35.7 mmHg. Left Atrium: Left atrial size was mildly dilated. Right Atrium: Right atrial size was normal in size. Pericardium: There is no evidence of pericardial effusion. Mitral Valve: The mitral valve is normal in structure. Trivial mitral valve regurgitation. Tricuspid Valve:  The tricuspid valve is normal in structure. Tricuspid valve regurgitation is trivial. Aortic Valve: The aortic valve is normal in structure. Aortic valve regurgitation is not visualized. Aortic valve mean gradient measures 6.0 mmHg. Aortic valve peak gradient measures 11.4 mmHg. Aortic valve area, by VTI measures 2.14 cm. Pulmonic Valve: The pulmonic valve was normal in structure. Pulmonic valve regurgitation is not visualized. Aorta: The aortic root and ascending aorta are structurally normal, with no evidence of dilitation. Venous: The inferior vena cava is normal in size with greater than 50% respiratory variability, suggesting right atrial pressure of 3 mmHg. IAS/Shunts: No atrial level shunt detected by color flow Doppler.  LEFT VENTRICLE PLAX 2D LVIDd:         5.18 cm   Diastology LVIDs:         3.50 cm   LV e' medial:    11.20 cm/s LV PW:         0.90 cm   LV E/e' medial:  11.3 LV IVS:        0.70 cm   LV e' lateral:   14.60 cm/s LVOT diam:     2.00 cm   LV E/e' lateral: 8.7 LV SV:  68 LV SV Index:   35 LVOT Area:     3.14 cm  RIGHT VENTRICLE RV Basal diam:  3.30 cm LEFT ATRIUM             Index        RIGHT ATRIUM           Index LA diam:        3.80 cm 1.94 cm/m   RA Area:     15.60 cm LA Vol (A2C):   69.7 ml 35.63 ml/m  RA Volume:   32.40 ml  16.56 ml/m LA Vol (A4C):   58.6 ml 29.95 ml/m LA Biplane Vol: 66.9 ml 34.20 ml/m  AORTIC VALVE AV Area (Vmax):    2.03 cm AV Area (Vmean):   2.12 cm AV Area (VTI):     2.14 cm AV Vmax:           169.00 cm/s AV Vmean:          111.000 cm/s AV VTI:            0.315 m AV Peak Grad:      11.4 mmHg AV Mean Grad:      6.0 mmHg LVOT Vmax:         109.00 cm/s LVOT Vmean:        74.900 cm/s LVOT VTI:          0.215 m LVOT/AV VTI ratio: 0.68  AORTA Ao Root diam: 3.30 cm MITRAL VALVE                TRICUSPID VALVE MV Area (PHT): 3.68 cm     TR Peak grad:   32.7 mmHg MV Decel Time: 206 msec     TR Vmax:        286.00 cm/s MV E velocity: 127.00 cm/s MV A  velocity: 65.50 cm/s   SHUNTS MV E/A ratio:  1.94         Systemic VTI:  0.22 m                             Systemic Diam: 2.00 cm Dietrich Pates MD Electronically signed by Dietrich Pates MD Signature Date/Time: 07/10/2021/6:04:28 PM    Final      Labs: Basic Metabolic Panel: Recent Labs  Lab 07/31/21 1014  NA 138  K 3.9  CL 104  CO2 23  GLUCOSE 115*  BUN 16  CREATININE 0.55*  CALCIUM 9.1   Liver Function Tests: Recent Labs  Lab 07/31/21 1014 08/03/21 0107 08/05/21 0236  AST 55* 99* 78*  ALT 52* 76* 75*  ALKPHOS 121 133* 107  BILITOT 0.3 0.4 0.5  PROT 7.2 7.2 7.7  ALBUMIN 3.0* 3.1* 3.3*    CBC: Recent Labs  Lab 07/31/21 1014  WBC 11.1*  NEUTROABS 3.3  HGB 11.5*  HCT 37.3*  MCV 86.1  PLT 416*       Signed:  Lachlan Pelto ANP [facility] 08/05/2021, 10:49 AM

## 2021-08-01 NOTE — Progress Notes (Signed)
[facility] PROGRESS NOTE  Justin Fisher [id number] DOB: May 16, 1986 DOA: 07/09/2021 PCP: Patient, No Pcp Per (Inactive)  Status: Remains inpatient appropriate because:  Unsafe discharge plan noting requires IV antibiotics for current medical condition history of IVD use not a candidate for home IV therapy; also requiring frequent reevaluation by surgical team regarding wound on upper right arm that is also requiring wound VAC therapy  Barriers to discharge: Social:Known history of active IV drug abuse prior to admission  Clinical: Continues to require IV antibiotics and regular wound therapy for wound upper extremity.  May require additional surgical procedures including plastic surgery  Wound pics since admit:                          Level of care:  Med-Surg   Code Status: Full Family Communication: Patient only DVT prophylaxis: SCDs COVID vaccination status: Unknown  HPI: 36 year old M with PMH of IVDU (heroin, fentanyl and meth), anxiety, HLD, suicidal ideation and tobacco use disorder presenting to ED on 07/09/2021 with progressive severe right upper extremity pain, swelling, oozing brown and foul-smelling liquid after injecting IV drugs using a needle with blood in it, and admitted for sepsis due to right upper arm cellulitis/neck fasciitis/abscess/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 orthopedic surgery and ID consulted.  Initially started on vancomycin, Zosyn and clindamycin.  He underwent I&D with VAC placement on 1/5 and 1/8.  Blood cultures NGTD.  Surgical culture with group F Streptococcus.  Antibiotics de-escalated to IV Zosyn.  ID, orthopedic surgery and plastic surgery following.   Hospital course complicated by acute blood loss anemia related to surgery.  He had about 2 L blood transfused from 1/8-1/9.  Subjective: Awakened.  Questions answered regarding impending discharge that we will either be Monday or Sunday of this week pending plastic  surgery removal of VAC.  He is aware that methadone dose at discharge will be 55 mg and that paperwork requested by [facility] will be faxed today.  Objective: Vitals:   08/01/21 0604 08/01/21 0820  BP: 113/73 120/74  Pulse: 83 92  Resp: 18 16  Temp: (!) 97.5 F (36.4 C)   SpO2: 98% 99%    Intake/Output Summary (Last 24 hours) at 08/01/2021 0847 Last data filed at 08/01/2021 0622 Gross per 24 hour  Intake --  Output 1750 ml  Net -1750 ml   Filed Weights   07/10/21 2308 07/29/21 1408  Weight: 73.4 kg 72 kg    Exam:  Constitutional: No acute distress Respiratory: clear to auscultation bilaterally, no wheezing, no crackles. Normal respiratory effort.  RA Cardiovascular: Regular pulse, S1S2, No lower extremity edema.  Abdomen: no tenderness, no masses palpated. Bowel sounds positive. LBM 1/23 Skin: Wound VAC over upper right arm over surgical wound; dressing over right lateral thigh post STSG  Neurologic: CN 2-12 grossly intact. Sensation intact, Strength 5/5 x all 4 extremities.  Psychiatric: Awake and oriented x3.    Assessment/Plan: Acute problems: Right upper arm cellulitis/neck fasciitis/acute osteomyelitis and patient with history of IVDU/s/p ACELL graft placement 1/13 -Superficial wound culture positive for moderate strep group F with intraoperative surgical culture from 1/5 with moderate strep group F; blood cultures negative-as of 1/20 WBC has normalized -Orthopedic surgery, infectious disease and plastic surgery following. -IV Vanco, Zosyn and clinda>> IV Zosyn. Completed IV Unasyn 07/26/2021.  Continue Augmentin until 08/23/2021 -Status post skin graft on 1/24.  Anticipate can discharge home after current wound VAC discontinued -Continue RUE PT -Resume preadmission  Robaxin   Polysubstance use/IVDU including heroin, cocaine, fentanyl, meth and tobacco -UDS positive for cocaine on admission.  HIV nonreactive.  Hepatitis C antibody positive. -Continue nicotine gum but adjust  schedule to every 4 hours while awake and add a low-dose nicotine patch 7 mg/day -Hep B vaccine -Outpatient follow-up with [facility] upon discharge.  1/24 methadone increased to 50 mg   Mild transaminitis -Nonobstructive pattern: AST 39> 61 > 74 > 85 > 55 ALT 29 > 48 > 55 > 72 > 52 -Multiple potential medication sources: Tylenol, recent Unasyn, current Augmentin and NSAIDs. -1/26 LFTs appear to be trending downward.  Repeat LFTs on Sunday  Muscle strain -Continue low-dose ibuprofen 400 mg 3 times daily x5 days -CK 28 -Add K pad on 1/23 and ask physical therapy to assist -CT leg w/ contrast to r/o myositis questions stranding and possible cellulitis right lateral thigh but this is also in the same area as STSG.  Hopeful this is just expected inflammatory change postoperatively but have asked ID to review. -WBC today 11.1 without left shift; he remains afebrile   Acute blood loss anemia superimposed on chronic iron deficiency anemia -Hgb 15.5 in 2018.   -s/p 5 units of PRBCs this admission -Current hemoglobin 10.7  Elevated blood pressure without diagnosis of hypertension -Not on medications at home and blood pressure has improved with treatment of pain   Hyponatremia:  Resolved   Hypokalemia:  Resolved   History of anxiety/depression:  Stable. -On Vistaril at home -frequency increased this hospitalization to every 6 hours as needed -Anxiety has improved with the addition of methadone as well   Hepatitis C antibody positive -Follow quantitative RNA -Recommend follow-up at ID clinic after discharge   Data Reviewed: Basic Metabolic Panel: Recent Labs  Lab 07/29/21 0819 07/31/21 1014  NA 135 138  K 6.1* 3.9  CL 101 104  CO2 23 23  GLUCOSE 102* 115*  BUN 14 16  CREATININE 0.61 0.55*  CALCIUM 9.3 9.1   Liver Function Tests: Recent Labs  Lab 07/29/21 0819 07/31/21 1014  AST 85* 55*  ALT 72* 52*  ALKPHOS 109 121  BILITOT 1.2 0.3  PROT 8.1 7.2  ALBUMIN 3.5 3.0*     CBC: Recent Labs  Lab 07/31/21 1014  WBC 11.1*  NEUTROABS 3.3  HGB 11.5*  HCT 37.3*  MCV 86.1  PLT 416*    Cardiac Enzymes: No results for input(s): CKTOTAL, CKMB, CKMBINDEX, TROPONINI in the last 168 hours.     Scheduled Meds:  amoxicillin-clavulanate  1 tablet Oral Q12H   celecoxib  100 mg Oral BID   docusate sodium  100 mg Oral BID   feeding supplement  237 mL Oral BID BM   gabapentin  400 mg Oral TID   methadone  50 mg Oral Daily   mineral oil light   Topical Once   mirtazapine  30 mg Oral QHS   multivitamin with minerals  1 tablet Oral Daily   nicotine  7 mg Transdermal Daily   nicotine polacrilex  4 mg Oral Q4H while awake   sertraline  200 mg Oral Daily   traZODone  100 mg Oral QHS     Principal Problem:   Sepsis (HCC) Active Problems:   Heroin use disorder, moderate, dependence (HCC)   Anemia   Cellulitis   Hypokalemia   Hyponatremia   Tobacco abuse   Protein-calorie malnutrition, severe   Malnutrition of moderate degree   Abscess of right arm   Muscle strain  Abnormal CT scan   Consultants: Orthopedic team Plastic surgery Infectious disease  Procedures: Intraoperative IND with wound VAC placement of RUE on 1/5 and 1/8 Multiple procedures to right arm with ACELL graft followed by wound VAC 1/24 STSG right upper arm Echocardiogram  Antibiotics: Zosyn 1/4 through 1/9 Vancomycin 1/4 through 1/7 Cefazolin x1 dose 1/5 Clindamycin 1/5 through 1/7 Unasyn 1/9 >>   Time spent: 25 minutes    Erin Hearing ANP  [facility] 7 am - 330 pm/M-F for direct patient care and secure chat Please refer to Amion for contact info 23  days

## 2021-08-02 DIAGNOSIS — R6520 Severe sepsis without septic shock: Secondary | ICD-10-CM | POA: Diagnosis not present

## 2021-08-02 DIAGNOSIS — A021 Salmonella sepsis: Secondary | ICD-10-CM | POA: Diagnosis not present

## 2021-08-02 NOTE — Plan of Care (Signed)
  Problem: Nutrition: Goal: Adequate nutrition will be maintained Outcome: Progressing   Problem: Coping: Goal: Level of anxiety will decrease Outcome: Progressing   Problem: Elimination: Goal: Will not experience complications related to bowel motility Outcome: Progressing   Problem: Safety: Goal: Ability to remain free from injury will improve Outcome: Progressing   

## 2021-08-02 NOTE — Progress Notes (Signed)
PROGRESS NOTE  Justin Mcardleavid Fisher  DOB: 01/14/1986  PCP: Patient, No Pcp Per (Inactive) [id number]  DOA: 07/09/2021  LOS: 24 days  Hospital Day: 25  Chief Complaint  Patient presents with   Arm Injury    Right arm    Brief narrative: Justin Fisher is a 36 y.o. male with PMH significant for IV drug abuse, hyperlipidemia, anxiety disorder, suicidal ideation. Patient presented to the ED on 07/09/2021 with complaint of severe pain and swelling of right arm.  Patient short heroin in the right arm 5 days ago after which it progressively became swollen, red, tender and started wheezing brown foul-smelling liquid. He was seen in the ED and was along on 1/2. Right arm x-ray at that time showed large amount of soft tissue gas concerning for cellulitis.[name].  He left AMA, continued to have worsening symptoms and hence returned back to ED.  CT right humerus was obtained which showed extensive soft tissue swelling and air within the predominantly posterior and lateral aspect of right upper arm.  There is also cortical erosion within the humeral head greater tuberosity concerning for acute to subacute osteomyelitis.  There is also a rim-enhancing collection in the posterior to the mid to lateral aspect of the scapula suspicious for abscess. Started on broad-spectrum antibiotics Admitted to hospitalist service. Orthopedics consulted. See details as below  Subjective: Patient was seen and examined this morning. Pleasant young Caucasian male.  Lying down in bed.  Not in distress.  Has wound VAC attached to right arm.  Pain controlled.  Feels much better. Pending wound VAC removal by plastic surgery.  Assessment/Plan: Sepsis secondary to right arm cellulitis/necrotizing fasciitis/abscess - presented with with right arm cellulitis.  Imaging finding as above showing extensive cellulitis, underlying abscess and osteomyelitis. -1/5, patient underwent debridement of extensive right arm wound involving skin,  subcutaneous tissue, muscle fascia and bone.  Wound VAC applied. -1/8, repeat debridement was done -1/13, underwent ACELL graft placement -1/24, underwent skin graft. -Surgical culture grew group F Streptococcus. -Orthopedics, ID and plastic surgery consult appreciated -Per ID recommendation, patient completed IV Unasyn on 07/26/2021.  Currently on Augmentin planned till 08/23/2021.  Acute blood loss anemia -Hemoglobin level was as low as 6.3 postsurgically.  Patient received a total of 5 units of PRBCs this admission.  Hemoglobin consistently over 10 for last several days. Recent Labs    07/09/21 1900 07/09/21 2200 07/10/21 0254 07/13/21 0325 07/14/21 0323 07/15/21 0245 07/18/21 0601 07/19/21 0038 07/24/21 0547 07/31/21 1014  HGB 7.5*  --    < > 6.3*   < > 10.7* 10.7* 10.3* 11.3* 11.5*  MCV 70.8*  --    < > 76.1*   < > 80.9 84.9 85.4 86.5 86.1  FOLATE  --  10.8  --   --   --   --   --   --   --   --   FERRITIN  --   --   --  215  --   --   --   --   --   --   TIBC  --   --   --  343  --   --   --   --   --   --   IRON  --   --   --  55  --   --   --   --   --   --   RETICCTPCT 0.5  --   --   --   --   --   --   --   --   --    < > =  values in this interval not displayed.   Polysubstance use/IVDU including heroin, cocaine, fentanyl, meth and tobacco -UDS positive for cocaine on admission. -Counseled to quit drug abuse.  Currently on methadone  Chronic hep C -Antibody positive.  Follow-up 1 to 2-day.  Follow-up with ID as an outpatient.  Mobility: Encourage ambulation Living condition:  Goals of care:   Code Status: Full Code  Nutritional status: Body mass index is 24.86 kg/m.  Nutrition Problem: Moderate Malnutrition Etiology: social / environmental circumstances (IV drug use) Signs/Symptoms: mild fat depletion, moderate muscle depletion Diet:  Diet Order             Diet regular Room service appropriate? Yes; Fluid consistency: Thin  Diet effective now                   DVT prophylaxis:  SCDs Start: 07/16/21 1657   Antimicrobials: Augmentin Fluid: None Consultants: Orthopedics, plastic surgery, ID Family Communication: None at bedside  Status is: Inpatient  Continue in-hospital care because: Pending placement Level of care: Med-Surg   Dispo: The patient is from: Home              Anticipated d/c is to: Pending placement   Difficult to place patient No     Infusions:     Scheduled Meds:  amoxicillin-clavulanate  1 tablet Oral Q12H   celecoxib  100 mg Oral BID   docusate sodium  100 mg Oral BID   feeding supplement  237 mL Oral BID BM   gabapentin  400 mg Oral TID   methadone  55 mg Oral Daily   mineral oil light   Topical Once   mirtazapine  30 mg Oral QHS   multivitamin with minerals  1 tablet Oral Daily   nicotine  7 mg Transdermal Daily   nicotine polacrilex  4 mg Oral Q4H while awake   sertraline  200 mg Oral Daily   traZODone  100 mg Oral QHS    PRN meds: acetaminophen **OR** acetaminophen, albuterol, alum & mag hydroxide-simeth, hydrALAZINE, HYDROmorphone (DILAUDID) injection, hydrOXYzine, loperamide, methocarbamol, ondansetron **OR** ondansetron (ZOFRAN) IV, oxyCODONE   Antimicrobials: Anti-infectives (From admission, onward)    Start     Dose/Rate Route Frequency Ordered Stop   08/01/21 0000  amoxicillin-clavulanate (AUGMENTIN) 875-125 MG tablet        1 tablet Oral 2 times daily 08/01/21 0846     07/29/21 1430  ceFAZolin (ANCEF) IVPB 2g/100 mL premix  Status:  Discontinued        2 g 200 mL/hr over 30 Minutes Intravenous On call to O.R. 07/29/21 1213 07/29/21 1731   07/27/21 1000  amoxicillin-clavulanate (AUGMENTIN) 875-125 MG per tablet 1 tablet        1 tablet Oral Every 12 hours 07/25/21 1234     07/25/21 1400  ceFAZolin (ANCEF) IVPB 2g/100 mL premix        2 g 200 mL/hr over 30 Minutes Intravenous On call to O.R. 07/25/21 [id number] 07/26/21 0559   07/22/21 0830  ceFAZolin (ANCEF) IVPB 2g/100 mL premix         2 g 200 mL/hr over 30 Minutes Intravenous On call to O.R. 07/22/21 0744 07/22/21 1036   07/18/21 0845  ceFAZolin (ANCEF) IVPB 2g/100 mL premix        2 g 200 mL/hr over 30 Minutes Intravenous On call to O.R. 07/18/21 0745 07/18/21 1535   07/14/21 1400  Ampicillin-Sulbactam (UNASYN) 3 g in sodium chloride 0.9 % 100 mL IVPB  3 g 200 mL/hr over 30 Minutes Intravenous Every 6 hours 07/14/21 1106 07/26/21 2359   07/14/21 1015  Ampicillin-Sulbactam (UNASYN) 3 g in sodium chloride 0.9 % 100 mL IVPB  Status:  Discontinued        3 g 200 mL/hr over 30 Minutes Intravenous Every 6 hours 07/14/21 0924 07/14/21 1106   07/11/21 0600  ceFAZolin (ANCEF) IVPB 2g/100 mL premix  Status:  Discontinued        2 g 200 mL/hr over 30 Minutes Intravenous On call to O.R. 07/10/21 1347 07/10/21 1352   07/10/21 1445  ceFAZolin (ANCEF) IVPB 2g/100 mL premix        2 g 200 mL/hr over 30 Minutes Intravenous On call to O.R. 07/10/21 1352 07/10/21 1636   07/10/21 1300  clindamycin (CLEOCIN) IVPB 900 mg  Status:  Discontinued        900 mg 100 mL/hr over 30 Minutes Intravenous Every 8 hours 07/10/21 0018 07/13/21 1049   07/10/21 1000  vancomycin (VANCOREADY) IVPB 1250 mg/250 mL  Status:  Discontinued        1,250 mg 166.7 mL/hr over 90 Minutes Intravenous Every 12 hours 07/09/21 2121 07/12/21 1333   07/10/21 1000  piperacillin-tazobactam (ZOSYN) IVPB 3.375 g  Status:  Discontinued        3.375 g 12.5 mL/hr over 240 Minutes Intravenous Every 8 hours 07/10/21 0912 07/14/21 0924   07/09/21 2200  piperacillin-tazobactam (ZOSYN) IVPB 3.375 g  Status:  Discontinued        3.375 g 12.5 mL/hr over 240 Minutes Intravenous Every 8 hours 07/09/21 1958 07/10/21 0912   07/09/21 2000  vancomycin (VANCOREADY) IVPB 1500 mg/300 mL        1,500 mg 150 mL/hr over 120 Minutes Intravenous  Once 07/09/21 1956 07/10/21 0043   07/09/21 1845  vancomycin (VANCOCIN) IVPB 1000 mg/200 mL premix  Status:  Discontinued        1,000  mg 200 mL/hr over 60 Minutes Intravenous  Once 07/09/21 1843 07/09/21 2121       Objective: Vitals:   08/01/21 2117 08/02/21 0624  BP: 131/79 116/76  Pulse: 100 87  Resp: 18 18  Temp: 98.4 F (36.9 C) 98.6 F (37 C)  SpO2: 98% 100%    Intake/Output Summary (Last 24 hours) at 08/02/2021 1126 Last data filed at 08/02/2021 0739 Gross per 24 hour  Intake --  Output 2900 ml  Net -2900 ml   Filed Weights   07/10/21 2308 07/29/21 1408  Weight: 73.4 kg 72 kg   Weight change:  Body mass index is 24.86 kg/m.   Physical Exam: General exam: Young male.  Pleasant.  Not in distress. Skin: No rashes, lesions or ulcers. HEENT: Atraumatic, normocephalic, no obvious bleeding Lungs: Clear to auscultation bilaterally CVS: Regular rate and rhythm, no murmur GI/Abd soft, nontender, nondistended, bowel sound present CNS: Alert, awake, oriented x3 Psychiatry: Mood appropriate Extremities: No pedal edema, no calf tenderness.  Has a wound VAC attached to right arm.  No pedal edema.  No calf tenderness  Data Review: I have personally reviewed the laboratory data and studies available.  F/u labs ordered Unresulted Labs (From admission, onward)     Start     Ordered   08/03/21 0500  Hepatic function panel  Once-Timed,   R       Question:  Specimen collection method  Answer:  Lab=Lab collect   08/01/21 1230            Signed, Melina Schools Leda [place],  MD [facility] 08/02/2021

## 2021-08-03 DIAGNOSIS — R6520 Severe sepsis without septic shock: Secondary | ICD-10-CM | POA: Diagnosis not present

## 2021-08-03 DIAGNOSIS — A021 Salmonella sepsis: Secondary | ICD-10-CM | POA: Diagnosis not present

## 2021-08-03 LAB — HEPATIC FUNCTION PANEL
ALT: 76 U/L — ABNORMAL HIGH (ref 0–44)
AST: 99 U/L — ABNORMAL HIGH (ref 15–41)
Albumin: 3.1 g/dL — ABNORMAL LOW (ref 3.5–5.0)
Alkaline Phosphatase: 133 U/L — ABNORMAL HIGH (ref 38–126)
Bilirubin, Direct: 0.1 mg/dL (ref 0.0–0.2)
Indirect Bilirubin: 0.3 mg/dL (ref 0.3–0.9)
Total Bilirubin: 0.4 mg/dL (ref 0.3–1.2)
Total Protein: 7.2 g/dL (ref 6.5–8.1)

## 2021-08-03 NOTE — Progress Notes (Signed)
PROGRESS NOTE  Justin Fisher  DOB: [birth year]  PCP: Patient, No Pcp Per (Inactive) XBD:[id number]  DOA: 07/09/2021  LOS: 25 days  Hospital Day: 26  Chief Complaint  Patient presents with   Arm Injury    Right arm    Brief narrative: Justin Fisher is a 36 y.o. male with PMH significant for IV drug abuse, hyperlipidemia, anxiety disorder, suicidal ideation. Patient presented to the ED on 07/09/2021 with complaint of severe pain and swelling of right arm.  Patient short heroin in the right arm 5 days ago after which it progressively became swollen, red, tender and started wheezing brown foul-smelling liquid. He was seen in the ED and was along on 1/2. Right arm x-ray at that time showed large amount of soft tissue gas concerning for cellulitis.[name]  He left AMA, continued to have worsening symptoms and hence returned back to ED.  CT right humerus was obtained which showed extensive soft tissue swelling and air within the predominantly posterior and lateral aspect of right upper arm.  There is also cortical erosion within the humeral head greater tuberosity concerning for acute to subacute osteomyelitis.  There is also a rim-enhancing collection in the posterior to the mid to lateral aspect of the scapula suspicious for abscess. Started on broad-spectrum antibiotics Admitted to hospitalist service. Orthopedics consulted. See details as below  Subjective: Patient was seen and examined this morning.  Lying down in bed.  Not in distress.  Pain controlled. Pending wound VAC removal by plastic surgery.  Assessment/Plan: Sepsis secondary to right arm cellulitis/necrotizing fasciitis/abscess - presented with with right arm cellulitis.  Imaging finding as above showing extensive cellulitis, underlying abscess and osteomyelitis. -1/5, patient underwent debridement of extensive right arm wound involving skin, subcutaneous tissue, muscle fascia and bone.  Wound VAC applied. -1/8, repeat debridement  was done -1/13, underwent ACELL graft placement -1/24, underwent skin graft. -Surgical culture grew group F Streptococcus. -Orthopedics, ID and plastic surgery consult appreciated -Per ID recommendation, patient completed IV Unasyn on 07/26/2021.  Currently on Augmentin planned till 08/23/2021.  Acute blood loss anemia -Hemoglobin level was as low as 6.3 postsurgically.  Patient received a total of 5 units of PRBCs this admission.  Hemoglobin consistently over 10 for last several days. Recent Labs    07/09/21 1900 07/09/21 2200 07/10/21 0254 07/13/21 0325 07/14/21 0323 07/15/21 0245 07/18/21 0601 07/19/21 0038 07/24/21 0547 07/31/21 1014  HGB 7.5*  --    < > 6.3*   < > 10.7* 10.7* 10.3* 11.3* 11.5*  MCV 70.8*  --    < > 76.1*   < > 80.9 84.9 85.4 86.5 86.1  FOLATE  --  10.8  --   --   --   --   --   --   --   --   FERRITIN  --   --   --  215  --   --   --   --   --   --   TIBC  --   --   --  343  --   --   --   --   --   --   IRON  --   --   --  55  --   --   --   --   --   --   RETICCTPCT 0.5  --   --   --   --   --   --   --   --   --    < > =  values in this interval not displayed.   Polysubstance use/IVDU including heroin, cocaine, fentanyl, meth and tobacco -UDS positive for cocaine on admission. -Counseled to quit drug abuse.  Currently on methadone  Chronic hep C -Antibody positive.  Follow-up 1 to 2-day.  Follow-up with ID as an outpatient.  Mobility: Encourage ambulation Living condition:  Goals of care:   Code Status: Full Code  Nutritional status: Body mass index is 24.86 kg/m.  Nutrition Problem: Moderate Malnutrition Etiology: social / environmental circumstances (IV drug use) Signs/Symptoms: mild fat depletion, moderate muscle depletion Diet:  Diet Order             Diet regular Room service appropriate? Yes; Fluid consistency: Thin  Diet effective now                  DVT prophylaxis:  SCDs Start: 07/16/21 1657   Antimicrobials:  Augmentin Fluid: None Consultants: Orthopedics, plastic surgery, ID Family Communication: None at bedside  Status is: Inpatient  Continue in-hospital care because: Pending wound VAC replacement by plastic surgery today Level of care: Med-Surg   Dispo: The patient is from: Home              Anticipated d/c is to: Pending placement   Difficult to place patient No  Infusions:     Scheduled Meds:  amoxicillin-clavulanate  1 tablet Oral Q12H   celecoxib  100 mg Oral BID   docusate sodium  100 mg Oral BID   feeding supplement  237 mL Oral BID BM   gabapentin  400 mg Oral TID   methadone  55 mg Oral Daily   mineral oil light   Topical Once   mirtazapine  30 mg Oral QHS   multivitamin with minerals  1 tablet Oral Daily   nicotine  7 mg Transdermal Daily   nicotine polacrilex  4 mg Oral Q4H while awake   sertraline  200 mg Oral Daily   traZODone  100 mg Oral QHS    PRN meds: acetaminophen **OR** acetaminophen, albuterol, alum & mag hydroxide-simeth, hydrALAZINE, HYDROmorphone (DILAUDID) injection, hydrOXYzine, loperamide, methocarbamol, ondansetron **OR** ondansetron (ZOFRAN) IV, oxyCODONE   Antimicrobials: Anti-infectives (From admission, onward)    Start     Dose/Rate Route Frequency Ordered Stop   08/01/21 0000  amoxicillin-clavulanate (AUGMENTIN) 875-125 MG tablet        1 tablet Oral 2 times daily 08/01/21 0846     07/29/21 1430  ceFAZolin (ANCEF) IVPB 2g/100 mL premix  Status:  Discontinued        2 g 200 mL/hr over 30 Minutes Intravenous On call to O.R. 07/29/21 1213 07/29/21 1731   07/27/21 1000  amoxicillin-clavulanate (AUGMENTIN) 875-125 MG per tablet 1 tablet        1 tablet Oral Every 12 hours 07/25/21 1234     07/25/21 1400  ceFAZolin (ANCEF) IVPB 2g/100 mL premix        2 g 200 mL/hr over 30 Minutes Intravenous On call to O.R. 07/25/21 6378 07/26/21 0559   07/22/21 0830  ceFAZolin (ANCEF) IVPB 2g/100 mL premix        2 g 200 mL/hr over 30 Minutes Intravenous  On call to O.R. 07/22/21 0744 07/22/21 1036   07/18/21 0845  ceFAZolin (ANCEF) IVPB 2g/100 mL premix        2 g 200 mL/hr over 30 Minutes Intravenous On call to O.R. 07/18/21 0745 07/18/21 1535   07/14/21 1400  Ampicillin-Sulbactam (UNASYN) 3 g in sodium chloride 0.9 % 100 mL  IVPB        3 g 200 mL/hr over 30 Minutes Intravenous Every 6 hours 07/14/21 1106 07/26/21 2359   07/14/21 1015  Ampicillin-Sulbactam (UNASYN) 3 g in sodium chloride 0.9 % 100 mL IVPB  Status:  Discontinued        3 g 200 mL/hr over 30 Minutes Intravenous Every 6 hours 07/14/21 0924 07/14/21 1106   07/11/21 0600  ceFAZolin (ANCEF) IVPB 2g/100 mL premix  Status:  Discontinued        2 g 200 mL/hr over 30 Minutes Intravenous On call to O.R. 07/10/21 1347 07/10/21 1352   07/10/21 1445  ceFAZolin (ANCEF) IVPB 2g/100 mL premix        2 g 200 mL/hr over 30 Minutes Intravenous On call to O.R. 07/10/21 1352 07/10/21 1636   07/10/21 1300  clindamycin (CLEOCIN) IVPB 900 mg  Status:  Discontinued        900 mg 100 mL/hr over 30 Minutes Intravenous Every 8 hours 07/10/21 0018 07/13/21 1049   07/10/21 1000  vancomycin (VANCOREADY) IVPB 1250 mg/250 mL  Status:  Discontinued        1,250 mg 166.7 mL/hr over 90 Minutes Intravenous Every 12 hours 07/09/21 2121 07/12/21 1333   07/10/21 1000  piperacillin-tazobactam (ZOSYN) IVPB 3.375 g  Status:  Discontinued        3.375 g 12.5 mL/hr over 240 Minutes Intravenous Every 8 hours 07/10/21 0912 07/14/21 0924   07/09/21 2200  piperacillin-tazobactam (ZOSYN) IVPB 3.375 g  Status:  Discontinued        3.375 g 12.5 mL/hr over 240 Minutes Intravenous Every 8 hours 07/09/21 1958 07/10/21 0912   07/09/21 2000  vancomycin (VANCOREADY) IVPB 1500 mg/300 mL        1,500 mg 150 mL/hr over 120 Minutes Intravenous  Once 07/09/21 1956 07/10/21 0043   07/09/21 1845  vancomycin (VANCOCIN) IVPB 1000 mg/200 mL premix  Status:  Discontinued        1,000 mg 200 mL/hr over 60 Minutes Intravenous  Once  07/09/21 1843 07/09/21 2121       Objective: Vitals:   08/02/21 2041 08/03/21 1016  BP: 116/60 122/72  Pulse: (!) 101 89  Resp: 18 18  Temp: 98.3 F (36.8 C) 98.1 F (36.7 C)  SpO2: 98% 98%    Intake/Output Summary (Last 24 hours) at 08/03/2021 1139 Last data filed at 08/03/2021 1026 Gross per 24 hour  Intake 480 ml  Output 900 ml  Net -420 ml   Filed Weights   07/10/21 2308 07/29/21 1408  Weight: 73.4 kg 72 kg   Weight change:  Body mass index is 24.86 kg/m.   Physical Exam: General exam: Young male.  Pleasant.  Not in physical distress Skin: No rashes, lesions or ulcers. HEENT: Atraumatic, normocephalic, no obvious bleeding Lungs: Clear to auscultation bilaterally CVS: Regular rate and rhythm, no murmur GI/Abd soft, nontender, nondistended, bowel sound present CNS: Alert, awake, oriented x3 Psychiatry: Mood appropriate Extremities: No pedal edema, no calf tenderness.  Has a wound VAC attached to right arm.  No pedal edema.  No calf tenderness  Data Review: I have personally reviewed the laboratory data and studies available.  F/u labs ordered Unresulted Labs (From admission, onward)    None       Signed, Lorin GlassBinaya Tiani Stanbery, MD [facility] 08/03/2021

## 2021-08-03 NOTE — Plan of Care (Signed)
  Problem: Activity: Goal: Risk for activity intolerance will decrease Outcome: Progressing   Problem: Nutrition: Goal: Adequate nutrition will be maintained Outcome: Progressing   Problem: Coping: Goal: Level of anxiety will decrease Outcome: Progressing   Problem: Elimination: Goal: Will not experience complications related to bowel motility Outcome: Progressing   

## 2021-08-04 DIAGNOSIS — R7401 Elevation of levels of liver transaminase levels: Secondary | ICD-10-CM

## 2021-08-04 DIAGNOSIS — L02413 Cutaneous abscess of right upper limb: Secondary | ICD-10-CM | POA: Diagnosis not present

## 2021-08-04 DIAGNOSIS — T148XXA Other injury of unspecified body region, initial encounter: Secondary | ICD-10-CM | POA: Diagnosis not present

## 2021-08-04 DIAGNOSIS — F1120 Opioid dependence, uncomplicated: Secondary | ICD-10-CM | POA: Diagnosis not present

## 2021-08-04 LAB — RAPID URINE DRUG SCREEN, HOSP PERFORMED
Amphetamines: NOT DETECTED
Barbiturates: NOT DETECTED
Benzodiazepines: NOT DETECTED
Cocaine: NOT DETECTED
Opiates: POSITIVE — AB
Tetrahydrocannabinol: NOT DETECTED

## 2021-08-04 MED ORDER — JUVEN PO PACK
1.0000 | PACK | Freq: Two times a day (BID) | ORAL | Status: DC
  Administered 2021-08-04: 1 via ORAL
  Filled 2021-08-04: qty 1

## 2021-08-04 MED ORDER — OXYCODONE HCL 5 MG PO TABS
15.0000 mg | ORAL_TABLET | ORAL | Status: DC | PRN
  Administered 2021-08-04 – 2021-08-05 (×4): 15 mg via ORAL
  Filled 2021-08-04 (×4): qty 3

## 2021-08-04 MED ORDER — ENSURE ENLIVE PO LIQD
237.0000 mL | Freq: Two times a day (BID) | ORAL | Status: DC
  Administered 2021-08-05: 237 mL via ORAL

## 2021-08-04 NOTE — Plan of Care (Signed)
°  Problem: Clinical Measurements: Goal: Ability to maintain clinical measurements within normal limits will improve Outcome: Progressing   Problem: Skin Integrity: Goal: Risk for impaired skin integrity will decrease Outcome: Progressing   Problem: Pain Managment: Goal: General experience of comfort will improve Outcome: Progressing   Problem: Safety: Goal: Ability to remain free from injury will improve Outcome: Progressing   Problem: Skin Integrity: Goal: Risk for impaired skin integrity will decrease Outcome: Progressing

## 2021-08-04 NOTE — Progress Notes (Signed)
[facility] PROGRESS NOTE  Justin Fisher [id number] DOB: Mar 15, 1986 DOA: 07/09/2021 PCP: Patient, No Pcp Per (Inactive)  Status: Remains inpatient appropriate because:  Unsafe discharge plan noting requires IV antibiotics for current medical condition history of IVD use not a candidate for home IV therapy; 1/30 discussed with plastic surgeon and plan is to remove wound VAC today and then see how patient tolerates dressing care before discharging home  Barriers to discharge: Social: Known history of active IV drug abuse prior to admission  Clinical: Has transition to oral antibiotic Anticipate discontinuation of wound VAC on 1/30 with plastics requesting continued monitoring until patient proves able to tolerate dressing changes without significant amounts of pain or other wound issues  Wound pics since admit:                          Level of care:  Med-Surg   Code Status: Full Family Communication: Patient only DVT prophylaxis: SCDs COVID vaccination status: Unknown  HPI: 36 year old M with PMH of IVDU (heroin, fentanyl and meth), anxiety, HLD, suicidal ideation and tobacco use disorder presenting to ED on 07/09/2021 with progressive severe right upper extremity pain, swelling, oozing brown and foul-smelling liquid after injecting IV drugs using a needle with blood in it, and admitted for sepsis due to right upper arm cellulitis/neck fasciitis/abscess/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 orthopedic surgery and ID consulted.  Initially started on vancomycin, Zosyn and clindamycin.  He underwent I&D with VAC placement on 1/5 and 1/8.  Blood cultures NGTD.  Surgical culture with group F Streptococcus.  Antibiotics de-escalated to IV Zosyn.  ID, orthopedic surgery and plastic surgery following.   Hospital course complicated by acute blood loss anemia related to surgery.  He had about 2 L blood transfused from 1/8-1/9.  Subjective: Awake.  Discussed that  wound VAC should be discontinued later today and then the plastic surgery team will begin wound care but wishes to monitor patient and wound to see tolerance before discharging home.  He is also aware that Les with [facility] is aware of above plan from plastic surgery team and discharge delayed until okayed by the plastic surgery team.  Objective: Vitals:   08/04/21 0437 08/04/21 0900  BP: 136/84 140/79  Pulse: 89 100  Resp: 14 18  Temp: 97.6 F (36.4 C) 98 F (36.7 C)  SpO2: 99% 100%    Intake/Output Summary (Last 24 hours) at 08/04/2021 1211 Last data filed at 08/04/2021 0900 Gross per 24 hour  Intake 1080 ml  Output --  Net 1080 ml   Filed Weights   07/10/21 2308 07/29/21 1408  Weight: 73.4 kg 72 kg    Exam:  Constitutional: No acute distress Respiratory: clear to auscultation bilaterally, Normal respiratory effort.  RA Cardiovascular: Regular pulse, S1S2, No lower extremity edema.  Abdomen: no tenderness, no masses palpated. Bowel sounds positive. LBM 1/27 Skin: Wound VAC over upper right arm over surgical wound; dressing over right lateral thigh post STSG  Neurologic: CN 2-12 grossly intact. Sensation intact, Strength 5/5 x all 4 extremities.  Psychiatric: Awake and oriented x3.    Assessment/Plan: Acute problems: Right upper arm cellulitis/neck fasciitis/acute osteomyelitis and patient with history of IVDU/s/p ACELL graft placement 1/13 -Superficial wound culture positive for moderate strep group F with intraoperative surgical culture from 1/5 with moderate strep group F; blood cultures negative-as of 1/20 WBC has normalized -Orthopedic surgery, infectious disease and plastic surgery following. -Completed IV Unasyn 07/26/2021.  Continue Augmentin  until 08/23/2021 -Status post skin graft on 1/24.  Discharge pending plastic surgery satisfaction that patient tolerating wound care and no issues with skin graft or wound prior to discharge   Polysubstance use/IVDU including heroin,  cocaine, fentanyl, meth and tobacco -UDS positive for cocaine on admission.  HIV nonreactive.  Hepatitis C antibody positive. -Continue nicotine gum but adjust schedule to every 4 hours while awake and add a low-dose nicotine patch 7 mg/day -Outpatient follow-up with [facility] upon discharge.  1/24 methadone increased to 50 mg   Mild transaminitis -Nonobstructive pattern: AST 39> 61 > 74 > 85 > 55 ALT 29 > 48 > 55 > 72 > 52 -Multiple potential medication sources: Tylenol, recent Unasyn, current Augmentin and NSAIDs. -1/26 LFTs appear to be trending downward.  Repeat LFTs on Sunday  Muscle strain -Continue Celebrex twice daily but continue to follow LFTs.  If continues to trend up will need to discontinue Celebrex and discuss with pharmacist to see if alternative NSAIDs available   Acute blood loss anemia superimposed on chronic iron deficiency anemia -Hgb 15.5 in 2018.   -s/p 5 units of PRBCs this admission -Current hemoglobin 11.5  Elevated blood pressure without diagnosis of hypertension -Not on medications at home and blood pressure has improved with treatment of pain   Hyponatremia:  Resolved   Hypokalemia:  Resolved   History of anxiety/depression:  Stable. -On Vistaril at home -frequency increased this hospitalization to every 6 hours as needed -Anxiety has improved with the addition of methadone as well   Hepatitis C antibody positive -Follow quantitative RNA -Recommend follow-up at ID clinic after discharge -See above regarding trending LFTs -Can follow-up in the ID clinic after discharge -Was given hepatitis B vaccine this admission   Data Reviewed: Basic Metabolic Panel: Recent Labs  Lab 07/29/21 0819 07/31/21 1014  NA 135 138  K 6.1* 3.9  CL 101 104  CO2 23 23  GLUCOSE 102* 115*  BUN 14 16  CREATININE 0.61 0.55*  CALCIUM 9.3 9.1   Liver Function Tests: Recent Labs  Lab 07/29/21 0819 07/31/21 1014 08/03/21 0107  AST 85* 55* 99*  ALT 72* 52* 76*   ALKPHOS 109 121 133*  BILITOT 1.2 0.3 0.4  PROT 8.1 7.2 7.2  ALBUMIN 3.5 3.0* 3.1*    CBC: Recent Labs  Lab 07/31/21 1014  WBC 11.1*  NEUTROABS 3.3  HGB 11.5*  HCT 37.3*  MCV 86.1  PLT 416*    Cardiac Enzymes: No results for input(s): CKTOTAL, CKMB, CKMBINDEX, TROPONINI in the last 168 hours.     Scheduled Meds:  amoxicillin-clavulanate  1 tablet Oral Q12H   celecoxib  100 mg Oral BID   docusate sodium  100 mg Oral BID   feeding supplement  237 mL Oral BID BM   gabapentin  400 mg Oral TID   methadone  55 mg Oral Daily   mineral oil light   Topical Once   mirtazapine  30 mg Oral QHS   multivitamin with minerals  1 tablet Oral Daily   nicotine  7 mg Transdermal Daily   nicotine polacrilex  4 mg Oral Q4H while awake   sertraline  200 mg Oral Daily   traZODone  100 mg Oral QHS     Principal Problem:   Sepsis (HCC) Active Problems:   Heroin use disorder, moderate, dependence (HCC)   Anemia   Cellulitis   Hypokalemia   Hyponatremia   Tobacco abuse   Protein-calorie malnutrition, severe   Malnutrition of moderate  degree   Abscess of right arm   Muscle strain   Abnormal CT scan   Cellulitis of right arm   Transaminitis   Consultants: Orthopedic team Plastic surgery Infectious disease  Procedures: Intraoperative IND with wound VAC placement of RUE on 1/5 and 1/8 Multiple procedures to right arm with ACELL graft followed by wound VAC 1/24 STSG right upper arm Echocardiogram  Antibiotics: Zosyn 1/4 through 1/9 Vancomycin 1/4 through 1/7 Cefazolin x1 dose 1/5 Clindamycin 1/5 through 1/7 Unasyn 1/9 >>   Time spent: 25 minutes    Erin Hearing ANP  [facility] 7 am - 330 pm/M-F for direct patient care and secure chat Please refer to Amion for contact info 26  days

## 2021-08-04 NOTE — Progress Notes (Signed)
Physical Therapy Treatment Patient Details Name: Justin Fisher MRN: [id number] DOB: February 10, 1986 Today's Date: 08/04/2021   History of Present Illness Patient is a 36 y/o male admitted on 07/07/21 with RUE swelling, pain and foul smelling liquid draining after injecting IV druge. Found to have sepsis secondary to RLE cellulitis/neck fasciitis/abscess/right humeral osteomyelitis and positive group F Strep. s/p I&D with wound vac placement 1/5 and 07/13/21. s/p debridement again 07/22/21. PMH includes IVDU (heroin, fentanyl and meth), anxiety, suicidal ideation and tobacco use disorder.    PT Comments    Pt received in supine and agreeable and motivated for session and eager to ambulate. Pt continues with significant ambulation distance even with c/o of mild groin pain that pt stated did decrease with increased ambulation distance. Upon return to room, reviewed UE and LE AROM exercises for increased shoulder ROM and hip/groin stretching. At request of pt, HEP handout provided. Pt will benefit from continued PT to address ROM and functional deficits.    Recommendations for follow up therapy are one component of a multi-disciplinary discharge planning process, led by the attending physician.  Recommendations may be updated based on patient status, additional functional criteria and insurance authorization.  Follow Up Recommendations  Outpatient PT     Assistance Recommended at Discharge PRN  Patient can return home with the following Assistance with cooking/housework   Equipment Recommendations  None recommended by PT    Recommendations for Other Services       Precautions / Restrictions Precautions Precautions: Other (comment) Precaution Comments: wound vac, groin pain Restrictions Other Position/Activity Restrictions: permitted ROM to R shoulder     Mobility  Bed Mobility Overal bed mobility: Needs Assistance Bed Mobility: Supine to Sit, Sit to Supine     Supine to sit: Min  guard Sit to supine: Min guard   General bed mobility comments: mild use of rail to elevate upper body, increased time needed for LE management to EOB d/t donor site pain.    Transfers Overall transfer level: Needs assistance Equipment used: None Transfers: Sit to/from Stand Sit to Stand: Supervision           General transfer comment: some moments of impulsiveness to get up quickly before instructions but resolved as session progressed.    Ambulation/Gait Ambulation/Gait assistance: Supervision Gait Distance (Feet): 400 Feet Assistive device: None Gait Pattern/deviations: Step-to pattern, Step-through pattern, Decreased weight shift to right, Antalgic Gait velocity: decreased     General Gait Details: slow steady gait with decreased stance time on RLE due to groin pain and donor site pain. loosens up slightly with distance but still painfull   Stairs             Wheelchair Mobility    Modified Rankin (Stroke Patients Only)       Balance Overall balance assessment: Modified Independent              Cognition Arousal/Alertness: Awake/alert Behavior During Therapy: WFL for tasks assessed/performed Overall Cognitive Status: Within Functional Limits for tasks assessed         General Comments: pt very eager to ambulate today        Exercises General Exercises - Upper Extremity Shoulder Flexion: 5 reps, AROM, Right Shoulder ABduction: AROM, Right, 5 reps Elbow Flexion: AROM, 5 reps, Right Other Exercises Other Exercises: HEP provided : Access Code: [facility]  URL: [email or web address]  Date: 08/04/2021  Prepared by: Ernst Spell    Exercises  Seated Shoulder Flexion Full Range Single Arm - 1 x daily -  7 x weekly - 3 sets - 10 reps  Seated Single Arm Shoulder Abduction - 1 x daily - 7 x weekly - 3 sets - 10 reps  Seated Single Arm Shoulder Internal Rotation - 1 x daily - 7 x weekly - 3 sets - 10 reps  Seated Single Arm Shoulder External  Rotation - 1 x daily - 7 x weekly - 3 sets - 10 reps  Supine Hip Adductor Stretch - 1 x daily - 7 x weekly - 3 sets - 10 reps  Hip Flexion Stretch - 1 x daily - 7 x weekly - 3 sets - 10 reps    General Comments        Pertinent Vitals/Pain Pain Assessment Pain Assessment: Faces Faces Pain Scale: Hurts a little bit Pain Location: right groin with movement, RUE with AROM Pain Descriptors / Indicators: Discomfort, Grimacing, Guarding Pain Intervention(s): Limited activity within patient's tolerance, Monitored during session, Repositioned    Home Living Family/patient expects to be discharged to:: Private residence Living Arrangements: Other (Comment) Available Help at Discharge: Friend(s);Available PRN/intermittently             Home Equipment: None      Prior Function            PT Goals (current goals can now be found in the care plan section) Acute Rehab PT Goals Patient Stated Goal: to walk at home without a walker PT Goal Formulation: With patient Time For Goal Achievement: 08/12/21    Frequency    Min 2X/week       AM-PAC PT "6 Clicks" Mobility   Outcome Measure  Help needed turning from your back to your side while in a flat bed without using bedrails?: A Little Help needed moving from lying on your back to sitting on the side of a flat bed without using bedrails?: A Little Help needed moving to and from a bed to a chair (including a wheelchair)?: None Help needed standing up from a chair using your arms (e.g., wheelchair or bedside chair)?: None Help needed to walk in hospital room?: None Help needed climbing 3-5 steps with a railing? : A Little 6 Click Score: 21    End of Session Equipment Utilized During Treatment: Gait belt Activity Tolerance: Patient tolerated treatment well;Patient limited by pain Patient left: in bed;with call bell/phone within reach;with nursing/sitter in room Nurse Communication: Mobility status PT Visit Diagnosis: Pain Pain  - Right/Left: Right Pain - part of body: Shoulder     Time: [id number] PT Time Calculation (min) (ACUTE ONLY): 28 min  Charges:  $Gait Training: 8-22 mins $Therapeutic Activity: 8-22 mins                    Mariusz Jubb R. PTA Acute Rehabilitation Office: [place] 08/04/2021, 5:34 PM

## 2021-08-04 NOTE — Progress Notes (Signed)
Wound vac removed and new dressing with ABD pad, curlex and ACE wrap applied with plastics MD

## 2021-08-04 NOTE — Progress Notes (Signed)
Mobility Specialist Progress Note   08/04/21 1800  Mobility  Activity Ambulated independently to bathroom  Level of Assistance Independent after set-up  Assistive Device None  Distance Ambulated (ft) 12 ft  Activity Response Tolerated fair;Tolerated well  $Mobility charge 1 Mobility   Pt presented w/ inc pain in RUE, Pt stated they would be inclined to ambulated after using BR. After tx to BR pt's pain inc'd and feared it would get even worse if ambulation was to inc. Tx'd back to bed w/ all needs met and RN notified about pain. Will f/u tomorrow morning if time permits.     Holland Falling Mobility Specialist Phone Number [phone]

## 2021-08-04 NOTE — TOC Progression Note (Addendum)
Transition of Care [facility]) - Progression Note    Patient Details  Name: Justin Fisher MRN: [id number] Date of Birth: [birth year]  Transition of Care [facility]) CM/SW Thornton, RN Phone Number: 08/04/2021, 8:25 AM  Clinical Narrative:    CM called and spoke with Jerral Ralph  - cell- [phone] and clinicals along with Guest dosing form was sent to the [facility] - fax # [phone].  Beverlyn Roux, CM with [facility] plans to call the patient at the bedside today to discuss follow up appointment at the [facility] clinic for Methadone dosing and admission exam at the clinic by the attending MD. The patient will likely be discharged home in the next 1 day once patient's wound vac is removed by Dr. Erin Hearing and patient is able to tolerate and perform dressing changes for home.    08/04/2021 1030 - I called and spoke with Jerral Ralph, CM at [facility] and the attending physican at the [facility] clinic has waived the patient's clinic visit with the attending on Tuesday and the [facility] clinic will be able to see the patient for assessment on Wednesday, 08/06/21.  Tox screen results from today and progress notes will be faxed to the [facility] clinic.  08/04/2021 1444 - CM called and spoke with Jerral Ralph, CM at [facility] and clinicals, labwork results and progress notes were faxed to the facility.  [facility] is requesting a copy of the patient's insurance card - No available copy in Epic at this time.  I called the patient and he is aware and plans on providing a copy to [facility] once he is able to obtain from his home.  CM and MSW with DTP Team will continue to follow the patient for discharge planning to home with [facility] clinic follow up.     Expected Discharge Plan: Home/Self Care Barriers to Discharge: Continued Medical Work up  Expected Discharge Plan and Services Expected Discharge Plan: Home/Self Care In-house Referral: Clinical Social Work Discharge Planning Services: CM Consult Post Acute Care Choice: Durable Medical  Equipment (Patient has a [place] at the present - TOC will follow for wound vac needs at discharge if needed per Surgery MD/Plastics MD) Living arrangements for the past 2 months: Single Family Home                                       Social Determinants of Health (SDOH) Interventions    Readmission Risk Interventions Readmission Risk Prevention Plan 07/17/2021  Transportation Screening Complete  PCP or Specialist Appt within 5-7 Days Complete  Home Care Screening Complete  Medication Review (RN CM) Complete  Some recent data might be hidden

## 2021-08-04 NOTE — Progress Notes (Signed)
Nutrition Follow-up  DOCUMENTATION CODES:   Non-severe (moderate) malnutrition in context of social or environmental circumstances  INTERVENTION:  -continue Ensure Enlive po BID, each supplement provides 350 kcal and 20 grams of protein -continue MVI with minerals daily -1 packet Juven BID, each packet provides 95 calories, 2.5 grams of protein (collagen), and 9.8 grams of carbohydrate (3 grams sugar); also contains 7 grams of L-arginine and L-glutamine, 300 mg vitamin C, 15 mg vitamin E, 1.2 mcg vitamin B-12, 9.5 mg zinc, 200 mg calcium, and 1.5 g  Calcium Beta-hydroxy-Beta-methylbutyrate to support wound healing   NUTRITION DIAGNOSIS:   Moderate Malnutrition related to social / environmental circumstances (IV drug use) as evidenced by mild fat depletion, moderate muscle depletion.  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  1/05 s/p I&D R arm w/ VAC placement  1/08 s/p I&D R arm w/ VAC placement 1/11 s/p I&D R arm w/ VAC placement 1/13 s/p prep of R arm for Acell placement, evacuation of 38ml hematoma, placement of VAC 1/17 s/p I&D R arm for placement of graft, placement of Acell R arm, placement of wound VAC R arm 1/24 s/p simple closure of R arm open wound skin flap to triceps muscle, debridement w/ prep of R arm for placement of graft, split-thickness skin graft to R arm from R leg, and placement of wound vac R arm   Pt to have woundVAC removed today and, per NP, discharge pending plastic surgery satisfaction that patient tolerating wound care and no issues with skin graft or wound prior to discharge. Pt reports excellent appetite with 100% meal  completion noted x last 8 recorded meals. Pt continues to tolerate oral nutrition supplements well per RN. Recommend continue current nutrition plan of care with addition of juven supplementation to aid in wound healing.   Medications: colace, Ensure Enlive BID, remeron, mvi with minerals Labs reviewed.    Diet Order:   Diet Order             Diet regular Room service appropriate? Yes; Fluid consistency: Thin  Diet effective now                   EDUCATION NEEDS:   Education needs have been addressed  Skin:  Skin Assessment: Skin Integrity Issues: Skin Integrity Issues:: Incisions Incisions: R arm, R thigh  Last BM:  1/27  Height:   Ht Readings from Last 1 Encounters:  07/29/21 5\' 7"  (1.702 m)    Weight:   Wt Readings from Last 1 Encounters:  07/29/21 72 kg    BMI:  Body mass index is 24.86 kg/m.  Estimated Nutritional Needs:   Kcal:  2200-2400  Protein:  110-130g  Fluid:  >/=2.2L     07/31/21., MS, RD, LDN (she/her/hers) RD pager number and weekend/on-call pager number located in Amion.

## 2021-08-05 ENCOUNTER — Other Ambulatory Visit (HOSPITAL_COMMUNITY): Payer: Self-pay

## 2021-08-05 DIAGNOSIS — R6520 Severe sepsis without septic shock: Secondary | ICD-10-CM | POA: Diagnosis not present

## 2021-08-05 DIAGNOSIS — A021 Salmonella sepsis: Secondary | ICD-10-CM | POA: Diagnosis not present

## 2021-08-05 LAB — HEPATIC FUNCTION PANEL
ALT: 75 U/L — ABNORMAL HIGH (ref 0–44)
AST: 78 U/L — ABNORMAL HIGH (ref 15–41)
Albumin: 3.3 g/dL — ABNORMAL LOW (ref 3.5–5.0)
Alkaline Phosphatase: 107 U/L (ref 38–126)
Bilirubin, Direct: 0.1 mg/dL (ref 0.0–0.2)
Indirect Bilirubin: 0.4 mg/dL (ref 0.3–0.9)
Total Bilirubin: 0.5 mg/dL (ref 0.3–1.2)
Total Protein: 7.7 g/dL (ref 6.5–8.1)

## 2021-08-05 MED ORDER — TRAZODONE HCL 100 MG PO TABS
100.0000 mg | ORAL_TABLET | Freq: Every day | ORAL | 2 refills | Status: DC
  Filled 2021-08-05: qty 30, 30d supply, fill #0

## 2021-08-05 MED ORDER — SERTRALINE HCL 100 MG PO TABS
200.0000 mg | ORAL_TABLET | Freq: Every day | ORAL | 2 refills | Status: DC
  Filled 2021-08-05: qty 60, 30d supply, fill #0

## 2021-08-05 MED ORDER — MORPHINE SULFATE 15 MG PO TABS
15.0000 mg | ORAL_TABLET | Freq: Four times a day (QID) | ORAL | 0 refills | Status: DC | PRN
  Filled 2021-08-05: qty 10, 3d supply, fill #0

## 2021-08-05 NOTE — Progress Notes (Signed)
Patient doing well, tolerated wound vac takedown at bedside  PE VSS AF Graft with excellent take, donor site healing  A/P Since patient is tolerating dressing changes he may be discharged from plastics perspective.  He may need access to wound care supplies.  If discharged he may follow-up with me in office this week or next week.  Plastics [facility], call [phone].

## 2021-08-05 NOTE — TOC Transition Note (Addendum)
Transition of Care [facility]) - CM/SW Discharge Note   Patient Details  Name: Justin Fisher MRN: [id number] Date of Birth: [birth year]  [facility] CM/SW Contact:  Janae Bridgeman, RN Phone Number: 08/05/2021, 10:13 AM   Clinical Narrative:    Caryl Ada and Rennis Harding, NP met with the patient at the bedside to discuss discharge plans to home today.  The patient was provided with multiple dressing supplies from home for right arm and right leg wound care - dressing supplies to include non-adherent dressing, plain 4x4 gauze dressings, kerlix, paper tape, Ace wraps small and large, and ABD pads.  The patient was reminded that home health nursing was not available for home due to patient's history of IV drug abuse and multiple home health agencies were contacted weeks ago and did not offer services due to unsafe home environment.  The patient will receive wound care instructions from nursing today prior to discharge and sent home with supplies from home.  The patient was instructed by Dr. Domenica Reamer yesterday to follow up at his office with any problems or signs and symptoms of infection.  Follow up placed in the discharge instructions to call the office for appointment.  The patient was also set up with PCP follow up at a clinic as well.  The patient plans to see Jonathon Bellows, CM at [facility] tomorrow morning for Methadone follow up at the clinic.  Labwork and progress notes were faxed to the clinic yesterday and [facility] spoke with the patient on the phone to see the clinic tomorrow.  OP referral placed in the discharge instructions as well for OP substance abuse follow up.  Discharge medications will be provided through [facility] pharmacy today prior to discharge home.  The patient does not have his wallet in the hospital and plans to pay for co-pay once he arrives home.  The patient states that his brother will be providing transportation to home today by car.  Outpatient PT referral was placed for OT/PT  follow up and patient was given instructions to call the clinic for follow up appointments.  CM and MSW with [facility] Team will follow the patient for discharge to home today.   Final next level of care: OP Rehab (Patient set up with outpatient rehabilitation referral) Barriers to Discharge: No Barriers Identified   Patient Goals and CMS Choice Patient states their goals for this hospitalization and ongoing recovery are:: Patient plans to discharge home with roommates. CMS Medicare.gov Compare Post Acute Care list provided to:: Patient Choice offered to / list presented to : Patient  Discharge Placement                       Discharge Plan and Services In-house Referral: PCP / [facility], Clinical Social Work ([facility] follow up) Discharge Planning Services: CM Consult, Medication Assistance, Follow-up appt scheduled (Discharge medications provided through [facility] pharmacy) Post Acute Care Choice:  (Dressing supplies provided to the patient including Mepelix, 4x4 gauze, ABD pads, paper tape, kerlix, Ace wrap for home use prior to discharge)          DME Arranged:  (Multiple dressing supplies provided from nursing unit clean supply room for home use.)                    Social Determinants of Health (SDOH) Interventions     Readmission Risk Interventions Readmission Risk Prevention Plan 07/17/2021  Transportation Screening Complete  PCP or Specialist Appt within 5-7 Days Complete  Home Care Screening Complete  Medication Review (RN CM) Complete  Some recent data might be hidden

## 2021-08-05 NOTE — Progress Notes (Signed)
AVS provided and reviewed with pt. All question answered at this time.  °

## 2021-08-08 ENCOUNTER — Ambulatory Visit (INDEPENDENT_AMBULATORY_CARE_PROVIDER_SITE_OTHER): Payer: BC Managed Care – PPO | Admitting: Plastic Surgery

## 2021-08-08 ENCOUNTER — Other Ambulatory Visit: Payer: Self-pay

## 2021-08-08 ENCOUNTER — Encounter: Payer: Self-pay | Admitting: Plastic Surgery

## 2021-08-08 DIAGNOSIS — L02413 Cutaneous abscess of right upper limb: Secondary | ICD-10-CM

## 2021-08-08 LAB — FUNGAL ORGANISM REFLEX

## 2021-08-08 LAB — FUNGUS CULTURE RESULT

## 2021-08-08 LAB — FUNGUS CULTURE WITH STAIN

## 2021-08-09 NOTE — Progress Notes (Signed)
Patient is status post skin graft to right arm on 07/29/2021.  He is doing well and at home.  Physical exam Graft clean dry and intact and well reepithelialized.  Donor site healing well  Assessment and plan Continue to follow the patient is doing well after skin grafting to his extremity.

## 2021-08-19 ENCOUNTER — Inpatient Hospital Stay (INDEPENDENT_AMBULATORY_CARE_PROVIDER_SITE_OTHER): Payer: BC Managed Care – PPO | Admitting: Primary Care

## 2021-08-22 ENCOUNTER — Encounter: Payer: Self-pay | Admitting: Plastic Surgery

## 2021-08-22 ENCOUNTER — Other Ambulatory Visit: Payer: Self-pay

## 2021-08-22 ENCOUNTER — Ambulatory Visit (INDEPENDENT_AMBULATORY_CARE_PROVIDER_SITE_OTHER): Payer: BC Managed Care – PPO | Admitting: Plastic Surgery

## 2021-08-22 DIAGNOSIS — L02413 Cutaneous abscess of right upper limb: Secondary | ICD-10-CM

## 2021-08-22 NOTE — Progress Notes (Signed)
Patient is status post split thickness skin graft to right upper arm on 07/29/2021.  He is doing well without any complaints.  Physical exam Graft site intact with excellent skin graft take but some dryness  Donor site intact mostly reepithelialized   Assessment and plan We will see the patient back in a couple weeks to recheck his donor site.  He may apply moisturizer to the skin grafted site on his arm.

## 2021-09-05 ENCOUNTER — Ambulatory Visit: Payer: BC Managed Care – PPO | Admitting: Plastic Surgery

## 2021-09-25 ENCOUNTER — Telehealth: Payer: Self-pay | Admitting: Plastic Surgery

## 2021-09-25 NOTE — Telephone Encounter (Signed)
Pt is calling in stating that he is needing a letter for the facility ([facility]) that he is trying to get in to.  The letter needs to state that he is cleared to be around others with his newly skin graph.  They have a bed for him and if he is not able to get the letter today it will delay him getting in this month the next available will be in June.  The letter can be faxed to [phone] and a call with any questions can be directed to Reuel Boom the councilor at [phone] Ext: [phone]. ?

## 2021-10-08 NOTE — Telephone Encounter (Signed)
Just saw this.  He can be around others.  Not sure if they still need something?

## 2021-10-13 NOTE — Telephone Encounter (Signed)
Maybe just print this conversation and send?

## 2021-10-20 NOTE — Telephone Encounter (Signed)
Letter has been drawn up and faxed to [facility] with [facility] @ [phone].  ?

## 2023-03-01 ENCOUNTER — Ambulatory Visit: Payer: BC Managed Care – PPO | Admitting: Nurse Practitioner

## 2023-12-01 IMAGING — CT CT FEMUR *R* W/ CM
3 of 4 series · 13 of 33 positions shown, 16 images · IV contrast (agent unspecified)
Comparison: None.

CLINICAL DATA: Thigh pain

EXAM:
CT OF THE LOWER RIGHT EXTREMITY WITH CONTRAST
TECHNIQUE: Multidetector CT imaging of the lower right extremity was performed
according to the standard protocol following intravenous contrast
administration.

[Series 4: extremity soft tissue · axial · 0.66mm/px · z∈[+426,+884]mm · 7 of 271 slices shown, 9 images]
[im 21/271  soft-tissue]
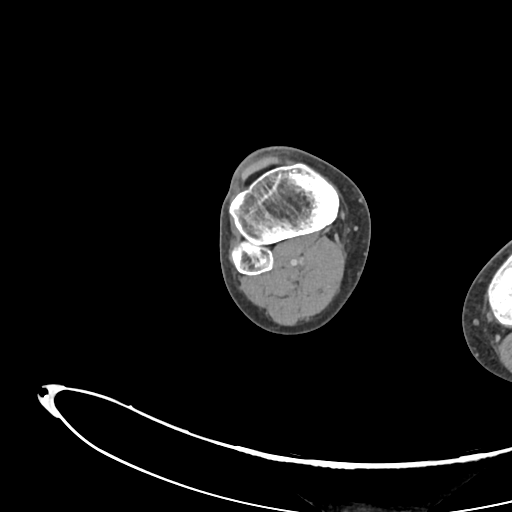
[im 21/271  bone]
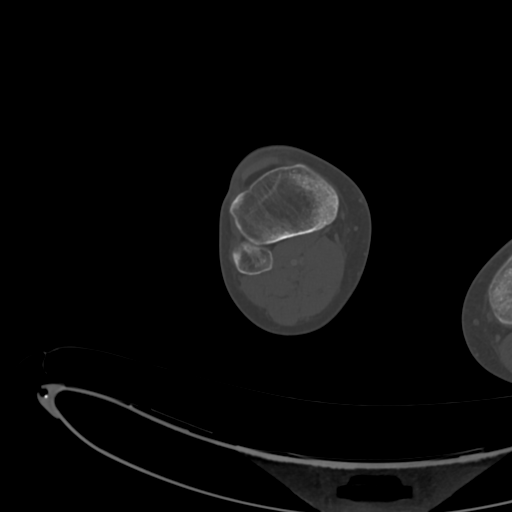
[im 63/271  bone]
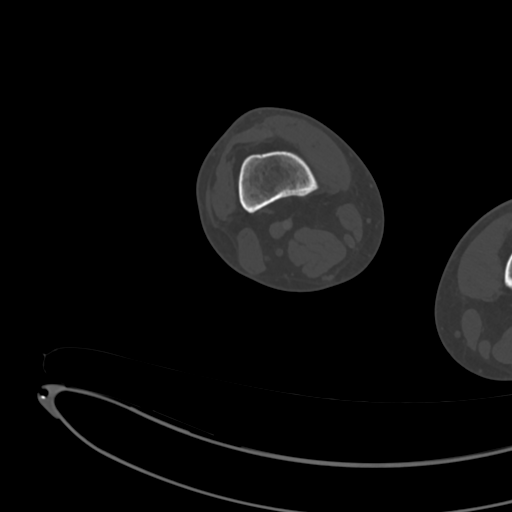
[im 104/271  bone]
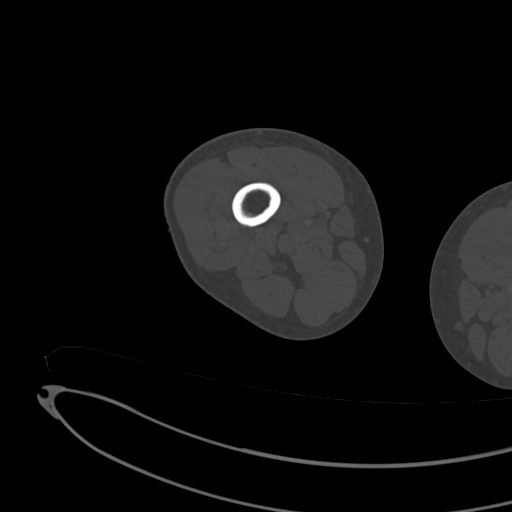
[im 146/271  bone]
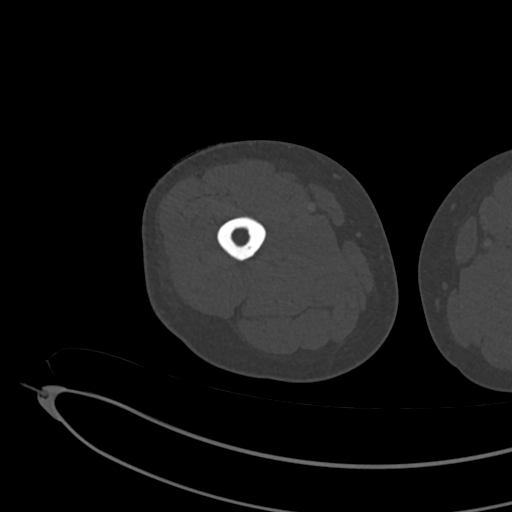
[im 167/271  soft-tissue]
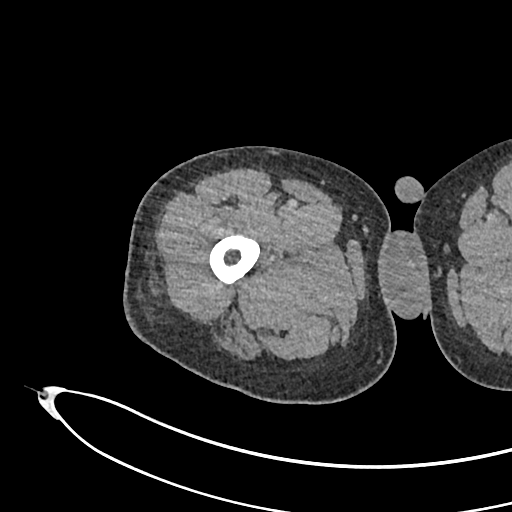
[im 167/271  bone]
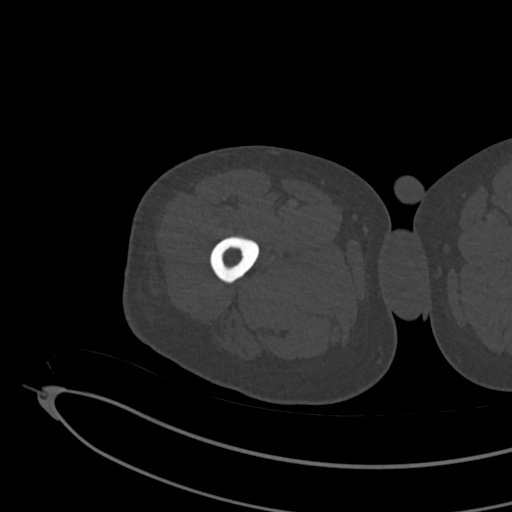
[im 208/271  bone]
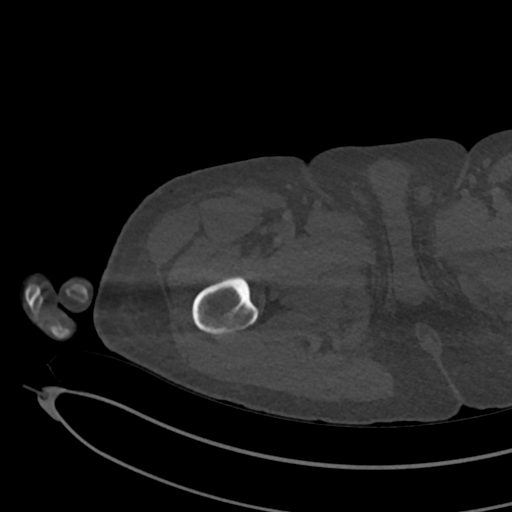
[im 250/271  bone]
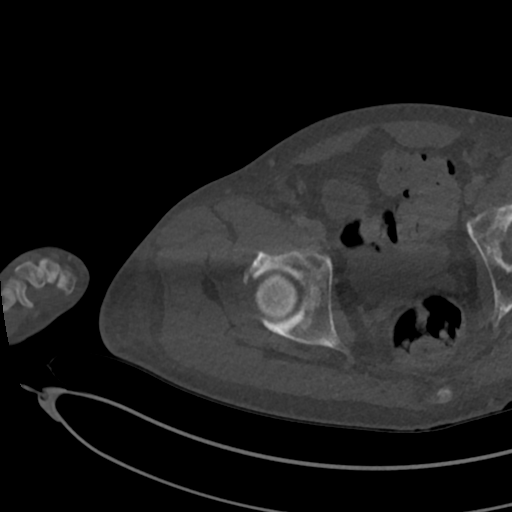

[Series 6: cor bone · coronal · 0.71mm/px · 1 of 126 slices shown]
[im 63/126  bone]
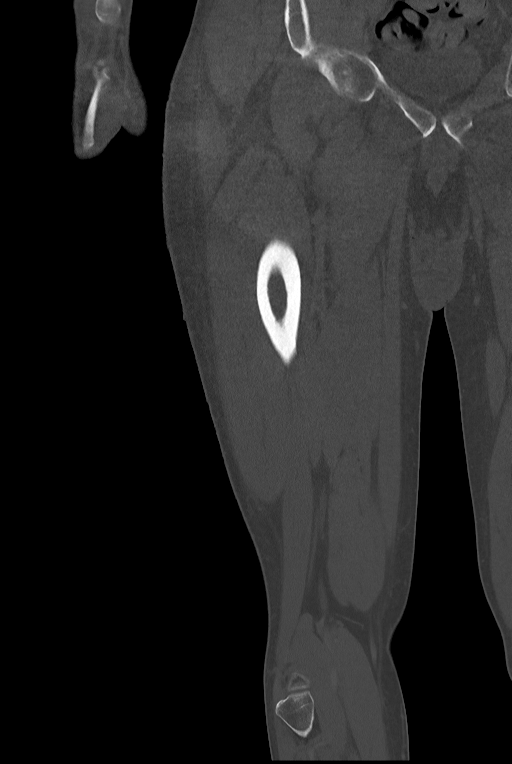

[Series 7: sag bone · sagittal · 0.69mm/px · 5 of 126 slices shown, 6 images]
[im 42/126  bone]
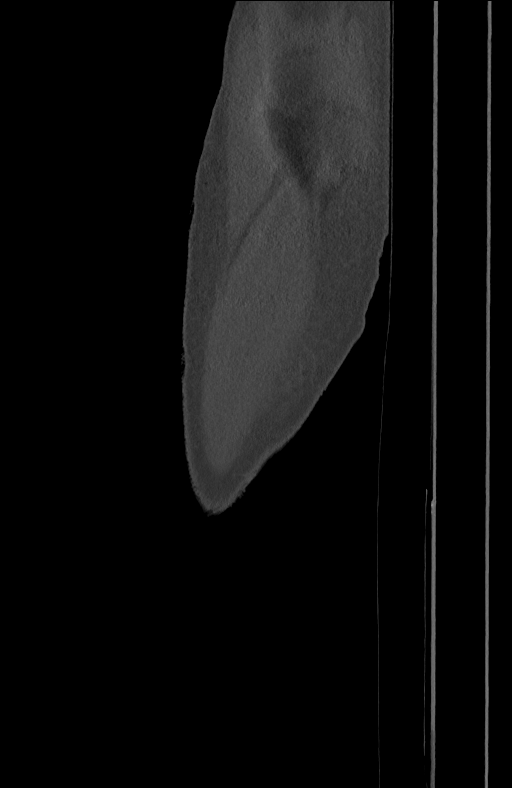
[im 53/126  bone]
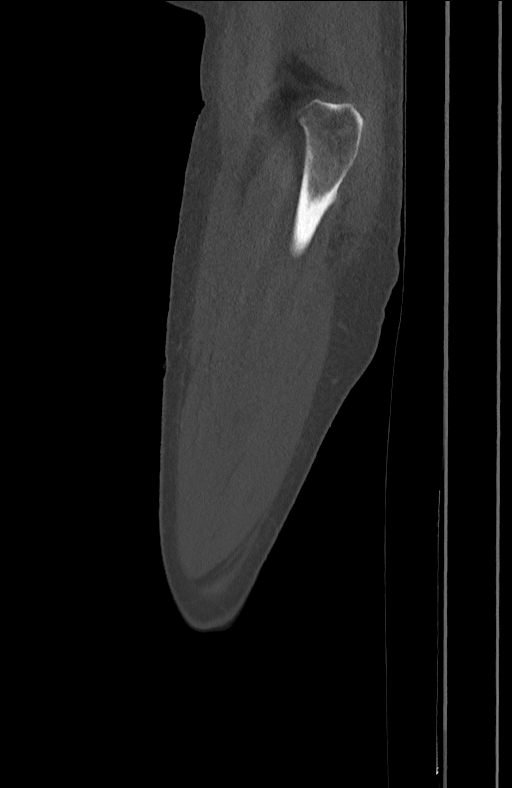
[im 63/126  soft-tissue]
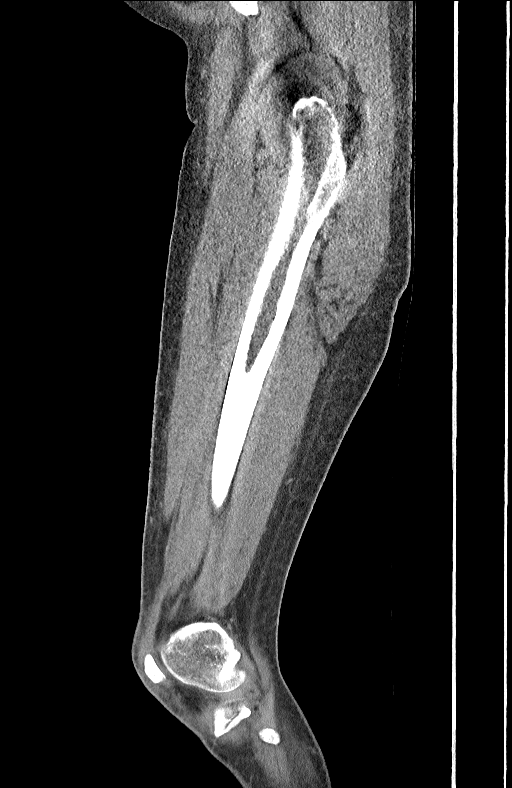
[im 63/126  bone]
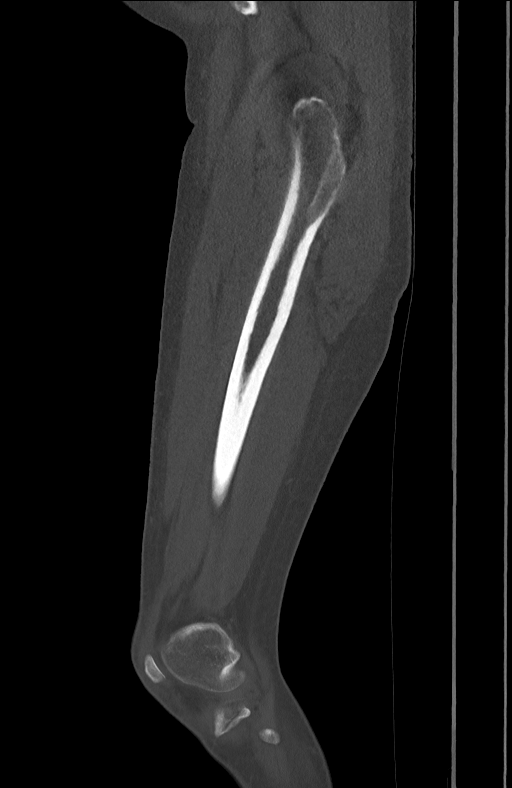
[im 73/126  bone]
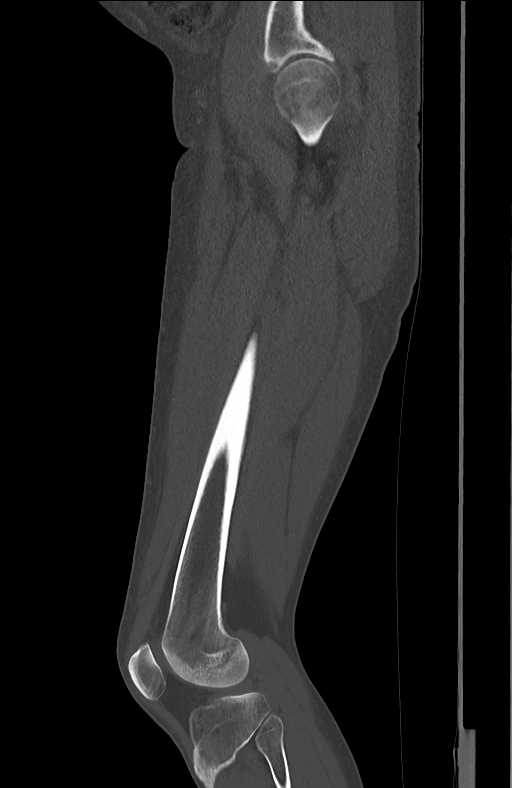
[im 84/126  bone]
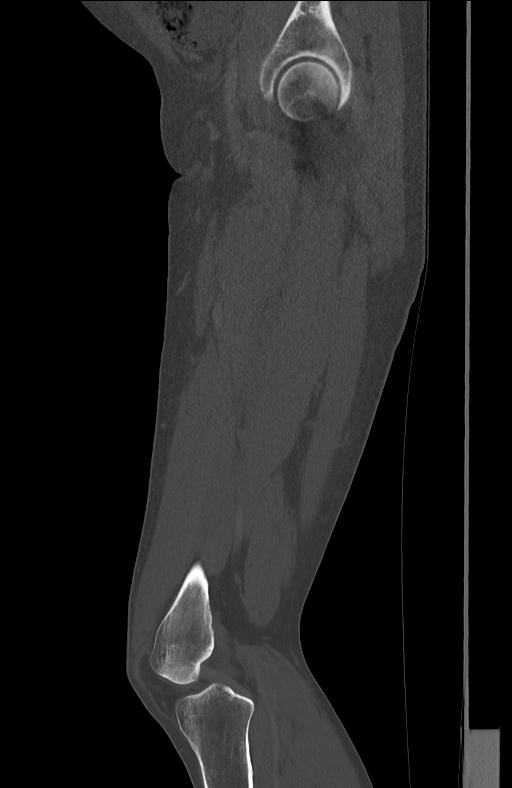

[13 of 33 positions shown; findings below may reference images not displayed]

RADIATION DOSE REDUCTION: This exam was performed according to the
departmental dose-optimization program which includes automated
exposure control, adjustment of the mA and/or kV according to
patient size and/or use of iterative reconstruction technique.

CONTRAST:  100mL OMNIPAQUE IOHEXOL 300 MG/ML  SOLN
FINDINGS: Study is limited due to beam hardening artifacts.

Bones/Joint/Cartilage

No acute fracture or dislocation. No suspicious bony lesions. No
bone destruction or periosteal reaction.

Ligaments

Suboptimally assessed by CT.

Muscles and Tendons

Grossly normal. No muscular or fascial fluid collections identified.

Soft tissues

Subcutaneous fat stranding visualized along the lateral aspect of
the thigh with no associated fluid collection visualized.
IMPRESSION: Subcutaneous fat stranding along the lateral aspect of the thigh,
correlate for possible cellulitis. No focal fluid collection
identified.

## 2023-12-06 ENCOUNTER — Ambulatory Visit: Payer: MEDICAID | Admitting: Family Medicine

## 2023-12-06 ENCOUNTER — Encounter: Payer: Self-pay | Admitting: Family Medicine

## 2023-12-06 ENCOUNTER — Ambulatory Visit (INDEPENDENT_AMBULATORY_CARE_PROVIDER_SITE_OTHER): Payer: MEDICAID

## 2023-12-06 VITALS — BP 118/76 | HR 84 | Temp 98.2°F | Ht 67.0 in | Wt 145.0 lb

## 2023-12-06 DIAGNOSIS — Z114 Encounter for screening for human immunodeficiency virus [HIV]: Secondary | ICD-10-CM | POA: Diagnosis not present

## 2023-12-06 DIAGNOSIS — M25512 Pain in left shoulder: Secondary | ICD-10-CM

## 2023-12-06 DIAGNOSIS — Z6822 Body mass index (BMI) 22.0-22.9, adult: Secondary | ICD-10-CM

## 2023-12-06 DIAGNOSIS — F1990 Other psychoactive substance use, unspecified, uncomplicated: Secondary | ICD-10-CM

## 2023-12-06 DIAGNOSIS — G8929 Other chronic pain: Secondary | ICD-10-CM | POA: Diagnosis not present

## 2023-12-06 DIAGNOSIS — Z7689 Persons encountering health services in other specified circumstances: Secondary | ICD-10-CM

## 2023-12-06 DIAGNOSIS — Z1159 Encounter for screening for other viral diseases: Secondary | ICD-10-CM

## 2023-12-06 LAB — CBC WITH DIFFERENTIAL/PLATELET
Basophils Absolute: 0.1 10*3/uL (ref 0.0–0.1)
Basophils Relative: 0.6 % (ref 0.0–3.0)
Eosinophils Absolute: 0.3 10*3/uL (ref 0.0–0.7)
Eosinophils Relative: 2 % (ref 0.0–5.0)
HCT: 31.9 % — ABNORMAL LOW (ref 39.0–52.0)
Hemoglobin: 9.7 g/dL — ABNORMAL LOW (ref 13.0–17.0)
Lymphocytes Relative: 44.6 % (ref 12.0–46.0)
Lymphs Abs: 5.5 10*3/uL — ABNORMAL HIGH (ref 0.7–4.0)
MCHC: 30.5 g/dL (ref 30.0–36.0)
MCV: 67 fl — ABNORMAL LOW (ref 78.0–100.0)
Monocytes Absolute: 0.8 10*3/uL (ref 0.1–1.0)
Monocytes Relative: 6.2 % (ref 3.0–12.0)
Neutro Abs: 5.8 10*3/uL (ref 1.4–7.7)
Neutrophils Relative %: 46.6 % (ref 43.0–77.0)
Platelets: 675 10*3/uL — ABNORMAL HIGH (ref 150.0–400.0)
RBC: 4.76 Mil/uL (ref 4.22–5.81)
RDW: 20.9 % — ABNORMAL HIGH (ref 11.5–15.5)
WBC: 12.4 10*3/uL — ABNORMAL HIGH (ref 4.0–10.5)

## 2023-12-06 LAB — COMPREHENSIVE METABOLIC PANEL WITH GFR
ALT: 23 U/L (ref 0–53)
AST: 27 U/L (ref 0–37)
Albumin: 3.8 g/dL (ref 3.5–5.2)
Alkaline Phosphatase: 92 U/L (ref 39–117)
BUN: 8 mg/dL (ref 6–23)
CO2: 24 meq/L (ref 19–32)
Calcium: 8.9 mg/dL (ref 8.4–10.5)
Chloride: 104 meq/L (ref 96–112)
Creatinine, Ser: 0.54 mg/dL (ref 0.40–1.50)
GFR: 125.5 mL/min (ref >60.00)
Glucose, Bld: 104 mg/dL — ABNORMAL HIGH (ref 70–99)
Potassium: 3.6 meq/L (ref 3.5–5.1)
Sodium: 137 meq/L (ref 135–145)
Total Bilirubin: 0.2 mg/dL (ref 0.2–1.2)
Total Protein: 8.1 g/dL (ref 6.0–8.3)

## 2023-12-06 LAB — TSH: TSH: 1.88 u[IU]/mL (ref 0.35–5.50)

## 2023-12-06 LAB — LIPID PANEL
Cholesterol: 107 mg/dL (ref 0–200)
HDL: 35.3 mg/dL — ABNORMAL LOW (ref >39.00)
LDL Cholesterol: 49 mg/dL (ref 0–99)
NonHDL: 71.54
Total CHOL/HDL Ratio: 3
Triglycerides: 111 mg/dL (ref 0.0–149.0)
VLDL: 22.2 mg/dL (ref 0.0–40.0)

## 2023-12-06 NOTE — Progress Notes (Signed)
 New Patient Office Visit  Subjective   Patient ID: Justin Fisher, male    DOB: Jun 21, 1986  Age: 38 y.o. MRN: [id number]  CC:  Chief Complaint  Patient presents with   Establish Care    Shoulder pain    HPI Justin Fisher presents to establish care with new provider.   Patients previous primary care provider: None   Specialist: [facility] in [place] for [facility]-telehealth   Patient is complaining left shoulder pain. Pain started a year ago, last summer. Intermittent pain. Described as sharp pain. Pain last minutes. Denies any recent injury, old injury when he was 38 years old. He reports multiple dislocations and had surgery in [place], [place]. With certain movements, pain will occur. Limited ROM. Usually takes Tylenol  or Ibuprofen  for pain. Denies any numbness or tingling.   Outpatient Encounter Medications as of 12/06/2023  Medication Sig   acetaminophen  (TYLENOL ) 325 MG tablet Take 2 tablets (650 mg total) by mouth every 6 (six) hours as needed for mild pain (or Fever >/= 101).   gabapentin  (NEURONTIN ) 400 MG capsule Take 1 capsule (400 mg total) by mouth 3 (three) times daily. For agitation   gabapentin  (NEURONTIN ) 800 MG tablet Take 800 mg by mouth 3 (three) times daily.   hydrOXYzine  (ATARAX ) 50 MG tablet Take 1 tablet (50 mg total) by mouth every 6 (six) hours as needed for anxiety.   mirtazapine  (REMERON ) 30 MG tablet Take 1 tablet (30 mg total) by mouth at bedtime.   sertraline  (ZOLOFT ) 100 MG tablet Take 2 tablets (200 mg total) by mouth daily.   Sertraline  HCl (ZOLOFT  PO) Take 150 mg by mouth. Take one tablet daily   methadone  (DOLOPHINE ) 5 MG tablet Take 11 tablets (55 mg total) by mouth daily.   [DISCONTINUED] amoxicillin -clavulanate (AUGMENTIN ) 875-125 MG tablet Take 1 tablet by mouth 2 (two) times daily. Last day of therapy- 08/23/21   [DISCONTINUED] celecoxib  (CELEBREX ) 100 MG capsule Take 1 capsule (100 mg total) by mouth 2 (two) times daily.    [DISCONTINUED] methocarbamol  (ROBAXIN ) 750 MG tablet Take 1 tablet (750 mg total) by mouth every 8 (eight) hours as needed for muscle spasms.   [DISCONTINUED] nicotine  polacrilex (NICORETTE ) 4 MG gum Take 1 each (4 mg total) by mouth every 4 (four) hours while awake.   [DISCONTINUED] traZODone  (DESYREL ) 100 MG tablet Take 1 tablet (100 mg total) by mouth at bedtime.   No facility-administered encounter medications on file as of 12/06/2023.    Past Medical History:  Diagnosis Date   Alcohol abuse    Anxiety    Depression    Drug abuse (HCC)    Suicidal thoughts     Past Surgical History:  Procedure Laterality Date   APPLICATION OF WOUND VAC Right 07/10/2021   Procedure: APPLICATION OF WOUND VAC;  Surgeon: Bettyjane Brunet, MD;  Location: [facility] OR;  Service: Orthopedics;  Laterality: Right;   APPLICATION OF WOUND VAC Right 07/18/2021   Procedure: WOUND VAC CHANGE;  Surgeon: Thornell Flirt, DO;  Location: [facility] OR;  Service: Plastics;  Laterality: Right;   APPLICATION OF WOUND VAC Right 07/22/2021   Procedure: WOUND VAC CHANGE;  Surgeon: Rodman Clam, MD;  Location: [facility] OR;  Service: Plastics;  Laterality: Right;   I & D EXTREMITY Right 07/10/2021   Procedure: IRRIGATION AND DEBRIDEMENT RIGHT UPPER ARM;  Surgeon: Bettyjane Brunet, MD;  Location: [facility] OR;  Service: Orthopedics;  Laterality: Right;   I & D EXTREMITY Right 07/13/2021   Procedure:  IRRIGATION AND DEBRIDEMENT UPPER EXTREMITY;  Surgeon: Dayne Even, MD;  Location: [facility] OR;  Service: Orthopedics;  Laterality: Right;   I & D EXTREMITY N/A 07/16/2021   Procedure: IRRIGATION AND DEBRIDEMENT EXTREMITY AND  VAC CHANGE;  Surgeon: Bettyjane Brunet, MD;  Location: [facility];  Service: Orthopedics;  Laterality: N/A;   I & D EXTREMITY Right 07/29/2021   Procedure: IRRIGATION AND DEBRIDEMENT EXTREMITY;  Surgeon: Rodman Clam, MD;  Location: [facility];  Service: Plastics;  Laterality: Right;   INCISION AND DRAINAGE OF WOUND Right 07/18/2021   Procedure:  IRRIGATION AND DEBRIDEMENT RIGHT ARM WOUND;  Surgeon: Thornell Flirt, DO;  Location: [facility];  Service: Plastics;  Laterality: Right;   INCISION AND DRAINAGE OF WOUND Right 07/22/2021   Procedure: IRRIGATION AND DEBRIDEMENT RIGHT ARM  WOUND;  Surgeon: Rodman Clam, MD;  Location: [facility];  Service: Plastics;  Laterality: Right;   SHOULDER SURGERY Left    SKIN SPLIT GRAFT Right 07/29/2021   Procedure: SKIN GRAFT SPLIT THICKNESS FROM RIGHT UPPER THIGH TO RIGHT UPPER ARM;  Surgeon: Rodman Clam, MD;  Location: [facility] OR;  Service: Plastics;  Laterality: Right;    Family History  Problem Relation Age of Onset   Cancer Mother        Lung   Early death Mother    Diabetes Father    Depression Father    Diabetes Maternal Grandmother    Diabetes Maternal Grandfather    Diabetes Paternal Grandmother    Diabetes Paternal Grandfather     Social History   Socioeconomic History   Marital status: Single    Spouse name: Not on file   Number of children: 0   Years of education: Not on file   Highest education level: High school graduate  Occupational History   Not on file  Tobacco Use   Smoking status: Every Day    Current packs/day: 0.50    Types: Cigarettes   Smokeless tobacco: Never  Vaping Use   Vaping status: Every Day  Substance and Sexual Activity   Alcohol use: Not Currently   Drug use: Yes    Types: IV    Comment: 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)   Sexual activity: Yes    Birth control/protection: None  Other Topics Concern   Not on file  Social History Narrative   Not on file   Social Drivers of Health   Financial Resource Strain: High Risk (12/06/2023)   Overall Financial Resource Strain (CARDIA)    Difficulty of Paying Living Expenses: Very hard  Food Insecurity: Food Insecurity Present (12/06/2023)   Hunger Vital Sign    Worried About Running Out of Food in the Last Year: Often true    Ran Out of Food in the Last Year: Sometimes true   Transportation Needs: No Transportation Needs (12/06/2023)   PRAPARE - Administrator, Civil Service (Medical): No    Lack of Transportation (Non-Medical): No  Physical Activity: Sufficiently Active (12/06/2023)   Exercise Vital Sign    Days of Exercise per Week: 5 days    Minutes of Exercise per Session: 30 min  Stress: No Stress Concern Present (12/06/2023)   [employer] of Occupational Health - Occupational Stress Questionnaire    Feeling of Stress : Only a little  Social Connections: Socially Isolated (12/06/2023)   Social Connection and Isolation Panel [NHANES]    Frequency of Communication with Friends and Family: Twice a week    Frequency  of Social Gatherings with Friends and Family: Once a week    Attends Religious Services: Never    Database administrator or Organizations: No    Attends Banker Meetings: Never    Marital Status: Never married  Intimate Partner Violence: Not At Risk (12/06/2023)   Humiliation, Afraid, Rape, and Kick questionnaire    Fear of Current or Ex-Partner: No    Emotionally Abused: No    Physically Abused: No    Sexually Abused: No    ROS See HPI above    Objective  BP 118/76   Pulse 84   Temp 98.2 F (36.8 C) (Oral)   Ht 5\' 7"  (1.702 m)   Wt 145 lb (65.8 kg)   SpO2 98%   BMI 22.71 kg/m   Physical Exam Vitals reviewed.  Constitutional:      General: He is not in acute distress.    Appearance: Normal appearance. He is normal weight. He is not ill-appearing, toxic-appearing or diaphoretic.  HENT:     Head: Normocephalic and atraumatic.  Eyes:     General:        Right eye: No discharge.        Left eye: No discharge.     Conjunctiva/sclera: Conjunctivae normal.  Cardiovascular:     Rate and Rhythm: Normal rate and regular rhythm.     Heart sounds: Normal heart sounds. No murmur heard.    No friction rub. No gallop.  Pulmonary:     Effort: Pulmonary effort is normal. No respiratory distress.     Breath  sounds: Normal breath sounds.  Musculoskeletal:     Left shoulder: Deformity present. Decreased range of motion. Normal pulse.  Skin:    General: Skin is warm and dry.     Findings: Abscess (Multiple healing, non look infected.) present.  Neurological:     General: No focal deficit present.     Mental Status: He is alert and oriented to person, place, and time. Mental status is at baseline.  Psychiatric:        Mood and Affect: Mood normal.        Behavior: Behavior normal.        Thought Content: Thought content normal.        Judgment: Judgment normal.      Assessment & Plan:  Chronic left shoulder pain -     DG Shoulder Left; Future -     Ambulatory referral to Orthopedic Surgery  Need for hepatitis C screening test -     Hepatitis C antibody  Encounter for screening for HIV -     HIV Antibody (routine testing w rflx)  IV drug user -     Hepatitis C antibody -     HIV Antibody (routine testing w rflx)  BMI 22.0-22.9, adult -     CBC with Differential/Platelet -     Comprehensive metabolic panel with GFR -     Lipid panel -     TSH  Encounter to establish care   1.Review health maintenance:  -Covid booster: Declines  -PNA vaccine: Had one vaccine 2.Ordered labs (CBC, CMP, Lipid Panel and TSH) based on BMI. HIV and Hep C screening and based on IV drug user. Aaron Aas 3.Ordered left shoulder x-ray due to pain and decreased ROM. Previously had surgery on shoulder.  4. Placed a referral to ortho for left shoulder pain.    Return in about 1 year (around 12/05/2024) for physical.   Lorain Fettes,  NP

## 2023-12-06 NOTE — Patient Instructions (Signed)
-  It was nice to meet you and look forward to taking care of you. -Ordered labs. Office will call with results and you will see them on MyChart. -Ordered left shoulder. Office will call with results and you will see them on MyChart.  -Placed a referral to ortho for left shoulder pain -Follow up in 1 year for physical.

## 2023-12-07 ENCOUNTER — Ambulatory Visit: Payer: Self-pay | Admitting: Family Medicine

## 2023-12-09 LAB — HEPATITIS C ANTIBODY: Hepatitis C Ab: REACTIVE — AB

## 2023-12-09 LAB — HEPATITIS C RNA QUANTITATIVE
HCV Quantitative Log: 7.05 {Log_IU}/mL — ABNORMAL HIGH
HCV RNA, PCR, QN: 11200000 [IU]/mL — ABNORMAL HIGH

## 2023-12-09 LAB — HIV ANTIBODY (ROUTINE TESTING W REFLEX): HIV 1&2 Ab, 4th Generation: NONREACTIVE

## 2023-12-21 ENCOUNTER — Ambulatory Visit: Payer: MEDICAID | Admitting: Orthopaedic Surgery

## 2024-05-08 ENCOUNTER — Inpatient Hospital Stay (HOSPITAL_COMMUNITY): Payer: MEDICAID | Admitting: Certified Registered Nurse Anesthetist

## 2024-05-08 ENCOUNTER — Encounter (HOSPITAL_COMMUNITY)
Admission: EM | Disposition: A | Discharge status: Home / Self Care | Payer: Self-pay | Source: Home / Self Care | Attending: Internal Medicine

## 2024-05-08 ENCOUNTER — Inpatient Hospital Stay (HOSPITAL_COMMUNITY)
Admission: EM | Admit: 2024-05-08 | Discharge: 2024-05-14 | DRG: 501 | Disposition: A | Discharge status: Home / Self Care | Payer: MEDICAID | Attending: Internal Medicine | Admitting: Internal Medicine

## 2024-05-08 ENCOUNTER — Other Ambulatory Visit: Payer: Self-pay

## 2024-05-08 ENCOUNTER — Encounter (HOSPITAL_COMMUNITY): Payer: Self-pay | Admitting: Pharmacy Technician

## 2024-05-08 ENCOUNTER — Encounter: Payer: Self-pay | Admitting: Radiology

## 2024-05-08 ENCOUNTER — Emergency Department (HOSPITAL_COMMUNITY): Payer: MEDICAID

## 2024-05-08 DIAGNOSIS — L03119 Cellulitis of unspecified part of limb: Secondary | ICD-10-CM | POA: Diagnosis not present

## 2024-05-08 DIAGNOSIS — F32A Depression, unspecified: Secondary | ICD-10-CM | POA: Diagnosis present

## 2024-05-08 DIAGNOSIS — F419 Anxiety disorder, unspecified: Secondary | ICD-10-CM | POA: Diagnosis present

## 2024-05-08 DIAGNOSIS — L03114 Cellulitis of left upper limb: Principal | ICD-10-CM | POA: Diagnosis present

## 2024-05-08 DIAGNOSIS — B1920 Unspecified viral hepatitis C without hepatic coma: Secondary | ICD-10-CM | POA: Diagnosis not present

## 2024-05-08 DIAGNOSIS — D509 Iron deficiency anemia, unspecified: Secondary | ICD-10-CM | POA: Diagnosis present

## 2024-05-08 DIAGNOSIS — Z79899 Other long term (current) drug therapy: Secondary | ICD-10-CM

## 2024-05-08 DIAGNOSIS — F1910 Other psychoactive substance abuse, uncomplicated: Secondary | ICD-10-CM | POA: Diagnosis not present

## 2024-05-08 DIAGNOSIS — D75839 Thrombocytosis, unspecified: Secondary | ICD-10-CM | POA: Diagnosis present

## 2024-05-08 DIAGNOSIS — F418 Other specified anxiety disorders: Secondary | ICD-10-CM | POA: Diagnosis not present

## 2024-05-08 DIAGNOSIS — Z833 Family history of diabetes mellitus: Secondary | ICD-10-CM

## 2024-05-08 DIAGNOSIS — B182 Chronic viral hepatitis C: Secondary | ICD-10-CM | POA: Diagnosis present

## 2024-05-08 DIAGNOSIS — L02419 Cutaneous abscess of limb, unspecified: Principal | ICD-10-CM | POA: Diagnosis present

## 2024-05-08 DIAGNOSIS — F17213 Nicotine dependence, cigarettes, with withdrawal: Secondary | ICD-10-CM | POA: Diagnosis not present

## 2024-05-08 DIAGNOSIS — F1120 Opioid dependence, uncomplicated: Secondary | ICD-10-CM | POA: Diagnosis present

## 2024-05-08 DIAGNOSIS — B9689 Other specified bacterial agents as the cause of diseases classified elsewhere: Secondary | ICD-10-CM | POA: Diagnosis not present

## 2024-05-08 DIAGNOSIS — S41102A Unspecified open wound of left upper arm, initial encounter: Secondary | ICD-10-CM | POA: Diagnosis not present

## 2024-05-08 DIAGNOSIS — Z818 Family history of other mental and behavioral disorders: Secondary | ICD-10-CM | POA: Diagnosis not present

## 2024-05-08 DIAGNOSIS — E871 Hypo-osmolality and hyponatremia: Secondary | ICD-10-CM | POA: Diagnosis present

## 2024-05-08 DIAGNOSIS — F1990 Other psychoactive substance use, unspecified, uncomplicated: Secondary | ICD-10-CM | POA: Diagnosis present

## 2024-05-08 DIAGNOSIS — L02414 Cutaneous abscess of left upper limb: Secondary | ICD-10-CM | POA: Diagnosis not present

## 2024-05-08 DIAGNOSIS — D72829 Elevated white blood cell count, unspecified: Secondary | ICD-10-CM | POA: Diagnosis not present

## 2024-05-08 DIAGNOSIS — M71022 Abscess of bursa, left elbow: Principal | ICD-10-CM | POA: Diagnosis present

## 2024-05-08 DIAGNOSIS — Z716 Tobacco abuse counseling: Secondary | ICD-10-CM | POA: Diagnosis not present

## 2024-05-08 DIAGNOSIS — L0291 Cutaneous abscess, unspecified: Secondary | ICD-10-CM

## 2024-05-08 HISTORY — PX: INCISION AND DRAINAGE OF WOUND: SHX1803

## 2024-05-08 LAB — COMPREHENSIVE METABOLIC PANEL WITH GFR
ALT: 25 U/L (ref 0–44)
AST: 29 U/L (ref 15–41)
Albumin: 3 g/dL — ABNORMAL LOW (ref 3.5–5.0)
Alkaline Phosphatase: 81 U/L (ref 38–126)
Anion gap: 11 (ref 5–15)
BUN: 7 mg/dL (ref 6–20)
CO2: 21 mmol/L — ABNORMAL LOW (ref 22–32)
Calcium: 8.5 mg/dL — ABNORMAL LOW (ref 8.9–10.3)
Chloride: 97 mmol/L — ABNORMAL LOW (ref 98–111)
Creatinine, Ser: 0.57 mg/dL — ABNORMAL LOW (ref 0.61–1.24)
GFR, Estimated: >60 mL/min (ref >60)
Glucose, Bld: 143 mg/dL — ABNORMAL HIGH (ref 70–99)
Potassium: 3.7 mmol/L (ref 3.5–5.1)
Sodium: 129 mmol/L — ABNORMAL LOW (ref 135–145)
Total Bilirubin: 0.5 mg/dL (ref 0.0–1.2)
Total Protein: 7.9 g/dL (ref 6.5–8.1)

## 2024-05-08 LAB — CBC WITH DIFFERENTIAL/PLATELET
Abs Immature Granulocytes: 0.16 K/uL — ABNORMAL HIGH (ref 0.00–0.07)
Basophils Absolute: 0.1 K/uL (ref 0.0–0.1)
Basophils Relative: 1 %
Eosinophils Absolute: 0.2 K/uL (ref 0.0–0.5)
Eosinophils Relative: 1 %
HCT: 33 % — ABNORMAL LOW (ref 39.0–52.0)
Hemoglobin: 10.1 g/dL — ABNORMAL LOW (ref 13.0–17.0)
Immature Granulocytes: 1 %
Lymphocytes Relative: 20 %
Lymphs Abs: 4.2 K/uL — ABNORMAL HIGH (ref 0.7–4.0)
MCH: 22.2 pg — ABNORMAL LOW (ref 26.0–34.0)
MCHC: 30.6 g/dL (ref 30.0–36.0)
MCV: 72.5 fL — ABNORMAL LOW (ref 80.0–100.0)
Monocytes Absolute: 1.8 K/uL — ABNORMAL HIGH (ref 0.1–1.0)
Monocytes Relative: 9 %
Neutro Abs: 14 K/uL — ABNORMAL HIGH (ref 1.7–7.7)
Neutrophils Relative %: 68 %
Platelets: 524 K/uL — ABNORMAL HIGH (ref 150–400)
RBC: 4.55 MIL/uL (ref 4.22–5.81)
RDW: 18.3 % — ABNORMAL HIGH (ref 11.5–15.5)
WBC: 20.4 K/uL — ABNORMAL HIGH (ref 4.0–10.5)
nRBC: 0 % (ref 0.0–0.2)

## 2024-05-08 LAB — C-REACTIVE PROTEIN: CRP: 16.7 mg/dL — ABNORMAL HIGH (ref <1.0)

## 2024-05-08 LAB — PROTIME-INR
INR: 1 (ref 0.8–1.2)
Prothrombin Time: 14 s (ref 11.4–15.2)

## 2024-05-08 LAB — I-STAT CG4 LACTIC ACID, ED: Lactic Acid, Venous: 0.7 mmol/L (ref 0.5–1.9)

## 2024-05-08 SURGERY — IRRIGATION AND DEBRIDEMENT WOUND
Anesthesia: General | Site: Arm Upper | Laterality: Left

## 2024-05-08 MED ORDER — SERTRALINE HCL 50 MG PO TABS
150.0000 mg | ORAL_TABLET | Freq: Every day | ORAL | Status: DC
  Administered 2024-05-08 – 2024-05-14 (×7): 150 mg via ORAL
  Filled 2024-05-08 (×7): qty 1

## 2024-05-08 MED ORDER — LACTATED RINGERS IV SOLN: INTRAVENOUS | Status: DC

## 2024-05-08 MED ORDER — LACTATED RINGERS IV BOLUS (SEPSIS)
500.0000 mL | Freq: Once | INTRAVENOUS | Status: AC
  Administered 2024-05-08: 500 mL via INTRAVENOUS

## 2024-05-08 MED ORDER — CHLORHEXIDINE GLUCONATE 4 % EX SOLN: 60.0000 mL | Freq: Once | CUTANEOUS | Status: DC

## 2024-05-08 MED ORDER — LIDOCAINE 2% (20 MG/ML) 5 ML SYRINGE
INTRAMUSCULAR | Status: DC | PRN
  Administered 2024-05-08: 60 mg via INTRAVENOUS

## 2024-05-08 MED ORDER — MORPHINE SULFATE (PF) 4 MG/ML IV SOLN
4.0000 mg | Freq: Once | INTRAVENOUS | Status: AC
  Administered 2024-05-08: 4 mg via INTRAVENOUS
  Filled 2024-05-08: qty 1

## 2024-05-08 MED ORDER — ONDANSETRON HCL 4 MG/2ML IJ SOLN
INTRAMUSCULAR | Status: AC
  Filled 2024-05-08: qty 2

## 2024-05-08 MED ORDER — IOHEXOL 350 MG/ML SOLN
75.0000 mL | Freq: Once | INTRAVENOUS | Status: AC | PRN
  Administered 2024-05-08: 75 mL via INTRAVENOUS

## 2024-05-08 MED ORDER — LIDOCAINE 2% (20 MG/ML) 5 ML SYRINGE
INTRAMUSCULAR | Status: AC
Start: 2024-05-08 — End: 2024-05-08
  Filled 2024-05-08: qty 5

## 2024-05-08 MED ORDER — CHLORHEXIDINE GLUCONATE 0.12 % MT SOLN
OROMUCOSAL | Status: AC
  Administered 2024-05-08: 15 mL via OROMUCOSAL
  Filled 2024-05-08: qty 15

## 2024-05-08 MED ORDER — CHLORHEXIDINE GLUCONATE 0.12 % MT SOLN
15.0000 mL | Freq: Once | OROMUCOSAL | Status: AC
Start: 2024-05-08 — End: 2024-05-08

## 2024-05-08 MED ORDER — ROCURONIUM BROMIDE 10 MG/ML (PF) SYRINGE
PREFILLED_SYRINGE | INTRAVENOUS | Status: DC | PRN
  Administered 2024-05-08: 40 mg via INTRAVENOUS
  Administered 2024-05-08: 10 mg via INTRAVENOUS

## 2024-05-08 MED ORDER — 0.9 % SODIUM CHLORIDE (POUR BTL) OPTIME
TOPICAL | Status: DC | PRN
  Administered 2024-05-08: 1000 mL

## 2024-05-08 MED ORDER — METHADONE HCL 10 MG PO TABS
180.0000 mg | ORAL_TABLET | Freq: Every day | ORAL | Status: DC
  Administered 2024-05-09: 180 mg via ORAL
  Filled 2024-05-08: qty 18

## 2024-05-08 MED ORDER — PIPERACILLIN-TAZOBACTAM 3.375 G IVPB
3.3750 g | Freq: Three times a day (TID) | INTRAVENOUS | Status: DC
  Administered 2024-05-08 – 2024-05-11 (×10): 3.375 g via INTRAVENOUS
  Filled 2024-05-08 (×10): qty 50

## 2024-05-08 MED ORDER — MIDAZOLAM HCL 2 MG/2ML IJ SOLN
INTRAMUSCULAR | Status: AC
  Filled 2024-05-08: qty 2

## 2024-05-08 MED ORDER — KETAMINE HCL 10 MG/ML IJ SOLN
INTRAMUSCULAR | Status: DC | PRN
  Administered 2024-05-08: 20 mg via INTRAVENOUS
  Administered 2024-05-08: 10 mg via INTRAVENOUS

## 2024-05-08 MED ORDER — HYDROMORPHONE HCL 1 MG/ML IJ SOLN
0.2500 mg | INTRAMUSCULAR | Status: DC | PRN
  Administered 2024-05-08 (×2): 0.5 mg via INTRAVENOUS

## 2024-05-08 MED ORDER — HYDROXYZINE HCL 25 MG PO TABS
50.0000 mg | ORAL_TABLET | Freq: Four times a day (QID) | ORAL | Status: DC | PRN
Start: 2024-05-08 — End: 2024-05-14
  Administered 2024-05-08 – 2024-05-14 (×14): 50 mg via ORAL
  Filled 2024-05-08 (×15): qty 2

## 2024-05-08 MED ORDER — DEXMEDETOMIDINE HCL IN NACL 80 MCG/20ML IV SOLN
INTRAVENOUS | Status: AC
  Filled 2024-05-08: qty 20

## 2024-05-08 MED ORDER — BACITRACIN ZINC 500 UNIT/GM EX OINT
TOPICAL_OINTMENT | CUTANEOUS | Status: DC | PRN
  Administered 2024-05-08: 1 via TOPICAL

## 2024-05-08 MED ORDER — SODIUM CHLORIDE 0.9 % IR SOLN
Status: DC | PRN
  Administered 2024-05-08: 3000 mL

## 2024-05-08 MED ORDER — OXYCODONE HCL 5 MG PO TABS: 5.0000 mg | ORAL_TABLET | Freq: Once | ORAL | Status: DC | PRN

## 2024-05-08 MED ORDER — KETAMINE HCL 50 MG/5ML IJ SOSY
PREFILLED_SYRINGE | INTRAMUSCULAR | Status: AC
  Filled 2024-05-08: qty 5

## 2024-05-08 MED ORDER — ENOXAPARIN SODIUM 40 MG/0.4ML IJ SOSY
40.0000 mg | PREFILLED_SYRINGE | INTRAMUSCULAR | Status: DC
  Administered 2024-05-09 – 2024-05-13 (×5): 40 mg via SUBCUTANEOUS
  Filled 2024-05-08 (×5): qty 0.4

## 2024-05-08 MED ORDER — OXYCODONE HCL 5 MG PO TABS
5.0000 mg | ORAL_TABLET | Freq: Four times a day (QID) | ORAL | Status: DC | PRN
  Administered 2024-05-08 – 2024-05-13 (×14): 5 mg via ORAL
  Filled 2024-05-08 (×15): qty 1

## 2024-05-08 MED ORDER — MIRTAZAPINE 15 MG PO TABS
15.0000 mg | ORAL_TABLET | Freq: Every day | ORAL | Status: DC
  Administered 2024-05-08: 15 mg via ORAL
  Filled 2024-05-08: qty 1

## 2024-05-08 MED ORDER — NICOTINE POLACRILEX 2 MG MT GUM
2.0000 mg | CHEWING_GUM | OROMUCOSAL | Status: DC | PRN
Start: 2024-05-08 — End: 2024-05-09
  Administered 2024-05-09: 2 mg via ORAL
  Filled 2024-05-08 (×10): qty 1

## 2024-05-08 MED ORDER — DEXMEDETOMIDINE HCL IN NACL 80 MCG/20ML IV SOLN
INTRAVENOUS | Status: DC | PRN
  Administered 2024-05-08: 4 ug via INTRAVENOUS

## 2024-05-08 MED ORDER — VANCOMYCIN HCL 1250 MG/250ML IV SOLN
1250.0000 mg | Freq: Two times a day (BID) | INTRAVENOUS | Status: DC
  Administered 2024-05-08 – 2024-05-11 (×6): 1250 mg via INTRAVENOUS
  Filled 2024-05-08 (×7): qty 250

## 2024-05-08 MED ORDER — POVIDONE-IODINE 10 % EX SWAB
2.0000 | Freq: Once | CUTANEOUS | Status: AC
  Administered 2024-05-08: 2 via TOPICAL

## 2024-05-08 MED ORDER — ONDANSETRON HCL 4 MG PO TABS: 4.0000 mg | ORAL_TABLET | Freq: Four times a day (QID) | ORAL | Status: DC | PRN

## 2024-05-08 MED ORDER — LACTATED RINGERS IV SOLN
INTRAVENOUS | Status: DC
Start: 2024-05-08 — End: 2024-05-09

## 2024-05-08 MED ORDER — PROPOFOL 10 MG/ML IV BOLUS
INTRAVENOUS | Status: AC
  Filled 2024-05-08: qty 20

## 2024-05-08 MED ORDER — ONDANSETRON HCL 4 MG/2ML IJ SOLN
INTRAMUSCULAR | Status: DC | PRN
  Administered 2024-05-08: 4 mg via INTRAVENOUS

## 2024-05-08 MED ORDER — SUGAMMADEX SODIUM 200 MG/2ML IV SOLN
INTRAVENOUS | Status: DC | PRN
  Administered 2024-05-08: 100 mg via INTRAVENOUS
  Administered 2024-05-08: 50 mg via INTRAVENOUS

## 2024-05-08 MED ORDER — FENTANYL CITRATE (PF) 250 MCG/5ML IJ SOLN
INTRAMUSCULAR | Status: AC
  Filled 2024-05-08: qty 5

## 2024-05-08 MED ORDER — MIDAZOLAM HCL (PF) 2 MG/2ML IJ SOLN
INTRAMUSCULAR | Status: DC | PRN
  Administered 2024-05-08: 2 mg via INTRAVENOUS

## 2024-05-08 MED ORDER — DEXAMETHASONE SOD PHOSPHATE PF 10 MG/ML IJ SOLN
INTRAMUSCULAR | Status: DC | PRN
  Administered 2024-05-08: 5 mg via INTRAVENOUS

## 2024-05-08 MED ORDER — CEFAZOLIN SODIUM-DEXTROSE 2-4 GM/100ML-% IV SOLN: 2.0000 g | INTRAVENOUS | Status: DC

## 2024-05-08 MED ORDER — SODIUM CHLORIDE 0.9% FLUSH
3.0000 mL | Freq: Two times a day (BID) | INTRAVENOUS | Status: DC
  Administered 2024-05-08 – 2024-05-14 (×12): 3 mL via INTRAVENOUS

## 2024-05-08 MED ORDER — GABAPENTIN 400 MG PO CAPS
400.0000 mg | ORAL_CAPSULE | Freq: Three times a day (TID) | ORAL | Status: DC
  Administered 2024-05-08 – 2024-05-10 (×7): 400 mg via ORAL
  Filled 2024-05-08 (×6): qty 1
  Filled 2024-05-08: qty 4

## 2024-05-08 MED ORDER — FENTANYL CITRATE (PF) 250 MCG/5ML IJ SOLN
INTRAMUSCULAR | Status: DC | PRN
  Administered 2024-05-08: 50 ug via INTRAVENOUS

## 2024-05-08 MED ORDER — ONDANSETRON HCL 4 MG/2ML IJ SOLN: 4.0000 mg | Freq: Four times a day (QID) | INTRAMUSCULAR | Status: DC | PRN

## 2024-05-08 MED ORDER — PHENYLEPHRINE 80 MCG/ML (10ML) SYRINGE FOR IV PUSH (FOR BLOOD PRESSURE SUPPORT)
PREFILLED_SYRINGE | INTRAVENOUS | Status: DC | PRN
  Administered 2024-05-08 (×3): 160 ug via INTRAVENOUS

## 2024-05-08 MED ORDER — CHLORHEXIDINE GLUCONATE 4 % EX SOLN
Freq: Three times a day (TID) | CUTANEOUS | Status: DC
  Administered 2024-05-09 – 2024-05-14 (×11): 1 via TOPICAL
  Filled 2024-05-08 (×8): qty 15

## 2024-05-08 MED ORDER — VANCOMYCIN HCL 1750 MG/350ML IV SOLN
1750.0000 mg | Freq: Once | INTRAVENOUS | Status: AC
  Administered 2024-05-08: 1750 mg via INTRAVENOUS
  Filled 2024-05-08: qty 350

## 2024-05-08 MED ORDER — OXYCODONE HCL 5 MG/5ML PO SOLN: 5.0000 mg | Freq: Once | ORAL | Status: DC | PRN

## 2024-05-08 MED ORDER — AMISULPRIDE (ANTIEMETIC) 5 MG/2ML IV SOLN: 10.0000 mg | Freq: Once | INTRAVENOUS | Status: DC | PRN

## 2024-05-08 MED ORDER — ORAL CARE MOUTH RINSE: 15.0000 mL | Freq: Once | OROMUCOSAL | Status: AC

## 2024-05-08 MED ORDER — HYDROMORPHONE HCL 1 MG/ML IJ SOLN
INTRAMUSCULAR | Status: AC
  Filled 2024-05-08: qty 1

## 2024-05-08 MED ORDER — BACITRACIN ZINC 500 UNIT/GM EX OINT
TOPICAL_OINTMENT | CUTANEOUS | Status: AC
  Filled 2024-05-08: qty 28.35

## 2024-05-08 MED ORDER — PROPOFOL 10 MG/ML IV BOLUS
INTRAVENOUS | Status: DC | PRN
  Administered 2024-05-08: 20 mg via INTRAVENOUS
  Administered 2024-05-08: 180 mg via INTRAVENOUS

## 2024-05-08 SURGICAL SUPPLY — 50 items
BAG COUNTER SPONGE SURGICOUNT (BAG) ×1 IMPLANT
BNDG COHESIVE 1X5 TAN STRL LF (GAUZE/BANDAGES/DRESSINGS) IMPLANT
BNDG COMPR ESMARK 4X3 LF (GAUZE/BANDAGES/DRESSINGS) ×1 IMPLANT
BNDG ELASTIC 3INX 5YD STR LF (GAUZE/BANDAGES/DRESSINGS) ×1 IMPLANT
BNDG ELASTIC 4INX 5YD STR LF (GAUZE/BANDAGES/DRESSINGS) IMPLANT
BNDG ELASTIC 4X5.8 VLCR STR LF (GAUZE/BANDAGES/DRESSINGS) ×1 IMPLANT
BNDG GAUZE DERMACEA FLUFF 4 (GAUZE/BANDAGES/DRESSINGS) ×1 IMPLANT
CORD BIPOLAR FORCEPS 12FT (ELECTRODE) ×1 IMPLANT
COVER SURGICAL LIGHT HANDLE (MISCELLANEOUS) ×1 IMPLANT
CUFF TOURN SGL QUICK 18X4 (TOURNIQUET CUFF) ×1 IMPLANT
CUFF TRNQT CYL 24X4X16.5-23 (TOURNIQUET CUFF) IMPLANT
DRAIN PENROSE 12X.25 LTX STRL (MISCELLANEOUS) IMPLANT
DRAPE SURG 17X23 STRL (DRAPES) ×1 IMPLANT
DRSG ADAPTIC 3X8 NADH LF (GAUZE/BANDAGES/DRESSINGS) ×1 IMPLANT
ELECTRODE REM PT RTRN 9FT ADLT (ELECTROSURGICAL) IMPLANT
GAUZE PACKING 1INX5YD STRL (GAUZE/BANDAGES/DRESSINGS) IMPLANT
GAUZE SPONGE 4X4 12PLY STRL (GAUZE/BANDAGES/DRESSINGS) ×1 IMPLANT
GAUZE STRETCH 2X75IN STRL (MISCELLANEOUS) IMPLANT
GAUZE XEROFORM 1X8 LF (GAUZE/BANDAGES/DRESSINGS) ×1 IMPLANT
GAUZE XEROFORM 5X9 LF (GAUZE/BANDAGES/DRESSINGS) IMPLANT
GLOVE BIO SURGEON STRL SZ7.5 (GLOVE) ×1 IMPLANT
GLOVE BIOGEL PI IND STRL 7.5 (GLOVE) ×1 IMPLANT
GOWN STRL REUS W/ TWL LRG LVL3 (GOWN DISPOSABLE) ×3 IMPLANT
GOWN STRL REUS W/ TWL XL LVL3 (GOWN DISPOSABLE) ×1 IMPLANT
KIT BASIN OR (CUSTOM PROCEDURE TRAY) ×1 IMPLANT
KIT TURNOVER KIT B (KITS) ×1 IMPLANT
MANIFOLD NEPTUNE II (INSTRUMENTS) ×1 IMPLANT
NDL HYPO 25GX1X1/2 BEV (NEEDLE) IMPLANT
NEEDLE HYPO 25GX1X1/2 BEV (NEEDLE) IMPLANT
PACK ORTHO EXTREMITY (CUSTOM PROCEDURE TRAY) ×1 IMPLANT
PAD ARMBOARD POSITIONER FOAM (MISCELLANEOUS) ×2 IMPLANT
PAD CAST 4YDX4 CTTN HI CHSV (CAST SUPPLIES) ×1 IMPLANT
PADDING CAST SYNTHETIC 4X4 STR (CAST SUPPLIES) IMPLANT
SET CYSTO IRRIGATION (SET/KITS/TRAYS/PACK) IMPLANT
SET HNDPC FAN SPRY TIP SCT (DISPOSABLE) IMPLANT
SOAP 2 % CHG 4 OZ (WOUND CARE) ×1 IMPLANT
SOLN 0.9% NACL POUR BTL 1000ML (IV SOLUTION) ×1 IMPLANT
SOLN STERILE WATER BTL 1000 ML (IV SOLUTION) ×1 IMPLANT
SPONGE T-LAP 18X18 ~~LOC~~+RFID (SPONGE) ×1 IMPLANT
SPONGE T-LAP 4X18 ~~LOC~~+RFID (SPONGE) ×1 IMPLANT
SUT ETHILON 4 0 PS 2 18 (SUTURE) IMPLANT
SUT NYLON ETHILON 5-0 P-3 1X18 (SUTURE) IMPLANT
SWAB COLLECTION DEVICE MRSA (MISCELLANEOUS) ×1 IMPLANT
SWAB CULTURE ESWAB REG 1ML (MISCELLANEOUS) IMPLANT
SYR CONTROL 10ML LL (SYRINGE) IMPLANT
TOWEL GREEN STERILE (TOWEL DISPOSABLE) ×1 IMPLANT
TOWEL GREEN STERILE FF (TOWEL DISPOSABLE) ×1 IMPLANT
TUBE CONNECTING 12X1/4 (SUCTIONS) ×1 IMPLANT
UNDERPAD 30X36 HEAVY ABSORB (UNDERPADS AND DIAPERS) ×1 IMPLANT
YANKAUER SUCT BULB TIP NO VENT (SUCTIONS) ×1 IMPLANT

## 2024-05-08 NOTE — Progress Notes (Signed)
 Report called to [facility], [facility]. Patient transported to room by Jinnie Lowenstein, RN.

## 2024-05-08 NOTE — Consult Note (Signed)
 Reason for Consult:LUE infection Referring Physician: Andrea Ness Time called: 9175 Time at bedside: 0930   Justin Fisher is an 38 y.o. male.  HPI: Keylon developed left arm pain, redness, and swelling beginning late last week, likely stemming from IVDU. He had a previous e/o similar in the right upper arm in 2023. He denies fevers, chills, sweats, N/V. Workup was worrisome for fasciitis and orthopedic surgery was consulted. 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  Past Medical History:  Diagnosis Date   Alcohol abuse    Anxiety    Depression    Drug abuse (HCC)    Suicidal thoughts     Past Surgical History:  Procedure Laterality Date   APPLICATION OF WOUND VAC Right 07/10/2021   Procedure: APPLICATION OF WOUND VAC;  Surgeon: Doll Skates, MD;  [facility];  Service: Orthopedics;  Laterality: Right;   APPLICATION OF WOUND VAC Right 07/18/2021   Procedure: WOUND VAC CHANGE;  Surgeon: Lowery Estefana RAMAN, DO;  [facility];  Service: Plastics;  Laterality: Right;   APPLICATION OF WOUND VAC Right 07/22/2021   Procedure: WOUND VAC CHANGE;  Surgeon: Marene Sieving, MD;  [facility];  Service: Plastics;  Laterality: Right;   I & D EXTREMITY Right 07/10/2021   Procedure: IRRIGATION AND DEBRIDEMENT RIGHT UPPER ARM;  Surgeon: Doll Skates, MD;  [facility];  Service: Orthopedics;  Laterality: Right;   I & D EXTREMITY Right 07/13/2021   Procedure: IRRIGATION AND DEBRIDEMENT UPPER EXTREMITY;  Surgeon: Sheril Coy, MD;  [facility];  Service: Orthopedics;  Laterality: Right;   I & D EXTREMITY N/A 07/16/2021   Procedure: IRRIGATION AND DEBRIDEMENT EXTREMITY AND  VAC CHANGE;  Surgeon: Doll Skates, MD;  [facility];  Service: Orthopedics;  Laterality: N/A;   I & D EXTREMITY Right 07/29/2021   Procedure: IRRIGATION AND DEBRIDEMENT EXTREMITY;  Surgeon: Marene Sieving, MD;  [facility];  Service: Plastics;  Laterality: Right;   INCISION AND DRAINAGE OF WOUND Right  07/18/2021   Procedure: IRRIGATION AND DEBRIDEMENT RIGHT ARM WOUND;  Surgeon: Lowery Estefana RAMAN, DO;  [facility];  Service: Plastics;  Laterality: Right;   INCISION AND DRAINAGE OF WOUND Right 07/22/2021   Procedure: IRRIGATION AND DEBRIDEMENT RIGHT ARM  WOUND;  Surgeon: Marene Sieving, MD;  [facility];  Service: Plastics;  Laterality: Right;   SHOULDER SURGERY Left    SKIN SPLIT GRAFT Right 07/29/2021   Procedure: SKIN GRAFT SPLIT THICKNESS FROM RIGHT UPPER THIGH TO RIGHT UPPER ARM;  Surgeon: Marene Sieving, MD;  [facility];  Service: Plastics;  Laterality: Right;    Family History  Problem Relation Age of Onset   Cancer Mother        Lung   Early death Mother    Diabetes Father    Depression Father    Diabetes Maternal Grandmother    Diabetes Maternal Grandfather    Diabetes Paternal Grandmother    Diabetes Paternal Grandfather     Social History:  reports that he has been smoking cigarettes. He has never used smokeless tobacco. He reports that he does not currently use alcohol. He reports current drug use. Drug: IV.  Allergies: No Known Allergies  Medications: I have reviewed the patient's current medications.  Results for orders placed or performed during the hospital encounter of 05/08/24 (from the past 48 hours)  Comprehensive metabolic panel     Status: Abnormal   Collection Time: 05/08/24  7:31 AM  Result Value Ref  Range   Sodium 129 (L) 135 - 145 mmol/L   Potassium 3.7 3.5 - 5.1 mmol/L   Chloride 97 (L) 98 - 111 mmol/L   CO2 21 (L) 22 - 32 mmol/L   Glucose, Bld 143 (H) 70 - 99 mg/dL    Comment: Glucose reference range applies only to samples taken after fasting for at least 8 hours.   BUN 7 6 - 20 mg/dL   Creatinine, Ser 9.42 (L) 0.61 - 1.24 mg/dL   Calcium 8.5 (L) 8.9 - 10.3 mg/dL   Total Protein 7.9 6.5 - 8.1 g/dL   Albumin 3.0 (L) 3.5 - 5.0 g/dL   AST 29 15 - 41 U/L   ALT 25 0 - 44 U/L   Alkaline Phosphatase 81 38 - 126 U/L   Total Bilirubin  0.5 0.0 - 1.2 mg/dL   GFR, Estimated >39 >39 mL/min    Comment: (NOTE) Calculated using the CKD-EPI Creatinine Equation (2021)    Anion gap 11 5 - 15    Comment: Performed at [facility], [address] [place], [place] [address]  CBC with Differential     Status: Abnormal   Collection Time: 05/08/24  7:31 AM  Result Value Ref Range   WBC 20.4 (H) 4.0 - 10.5 K/uL   RBC 4.55 4.22 - 5.81 MIL/uL   Hemoglobin 10.1 (L) 13.0 - 17.0 g/dL   HCT 66.9 (L) 60.9 - 47.9 %   MCV 72.5 (L) 80.0 - 100.0 fL   MCH 22.2 (L) 26.0 - 34.0 pg   MCHC 30.6 30.0 - 36.0 g/dL   RDW 81.6 (H) 88.4 - 84.4 %   Platelets 524 (H) 150 - 400 K/uL   nRBC 0.0 0.0 - 0.2 %   Neutrophils Relative % 68 %   Neutro Abs 14.0 (H) 1.7 - 7.7 K/uL   Lymphocytes Relative 20 %   Lymphs Abs 4.2 (H) 0.7 - 4.0 K/uL   Monocytes Relative 9 %   Monocytes Absolute 1.8 (H) 0.1 - 1.0 K/uL   Eosinophils Relative 1 %   Eosinophils Absolute 0.2 0.0 - 0.5 K/uL   Basophils Relative 1 %   Basophils Absolute 0.1 0.0 - 0.1 K/uL   Immature Granulocytes 1 %   Abs Immature Granulocytes 0.16 (H) 0.00 - 0.07 K/uL    Comment: Performed at [facility], [address] [place], [place] [address]  Protime-INR     Status: None   Collection Time: 05/08/24  7:31 AM  Result Value Ref Range   Prothrombin Time 14.0 11.4 - 15.2 seconds   INR 1.0 0.8 - 1.2    Comment: (NOTE) INR goal varies based on device and disease states. Performed at [facility], [address] [place], [place] [address]   I-Stat Lactic Acid, ED     Status: None   Collection Time: 05/08/24  7:45 AM  Result Value Ref Range   Lactic Acid, Venous 0.7 0.5 - 1.9 mmol/L    No results found.  Review of Systems  Constitutional:  Negative for chills, diaphoresis and fever.  HENT:  Negative for ear discharge, ear pain, hearing loss and tinnitus.   Eyes:  Negative for photophobia and pain.  Respiratory:  Negative for cough and shortness of breath.    Cardiovascular:  Negative for chest pain.  Gastrointestinal:  Negative for abdominal pain, nausea and vomiting.  Genitourinary:  Negative for dysuria, flank pain, frequency and urgency.  Musculoskeletal:  Positive for myalgias (LUE).  Negative for back pain and neck pain.  Neurological:  Negative for dizziness and headaches.  Hematological:  Does not bruise/bleed easily.  Psychiatric/Behavioral:  The patient is not nervous/anxious.    Blood pressure 121/81, pulse 99, temperature 98.1 F (36.7 C), resp. rate 18, height 5' 7 (1.702 m), weight 77.1 kg, SpO2 97%. Physical Exam Constitutional:      General: He is not in acute distress.    Appearance: He is well-developed. He is not diaphoretic.  HENT:     Head: Normocephalic and atraumatic.  Eyes:     General: No scleral icterus.       Right eye: No discharge.        Left eye: No discharge.     Conjunctiva/sclera: Conjunctivae normal.  Cardiovascular:     Rate and Rhythm: Normal rate and regular rhythm.  Pulmonary:     Effort: Pulmonary effort is normal. No respiratory distress.  Musculoskeletal:     Cervical back: Normal range of motion.     Comments: Left shoulder, elbow, wrist, digits- no skin wounds, mod TTP FA to distal UA, severe induration, no obvious fluctuance, no instability, no blocks to motion  Sens  Ax/R/M/U intact  Mot   Ax/ R/ PIN/ M/ AIN/ U intact  Rad 2+  Skin:    General: Skin is warm and dry.  Neurological:     Mental Status: He is alert.  Psychiatric:        Mood and Affect: Mood normal.        Behavior: Behavior normal.     Assessment/Plan: LUE infection -- Plan I&D this afternoon with Dr. Alyse. Please keep NPO.    Ozell DOROTHA Ned, PA-C Orthopedic Surgery [phone] 05/08/2024, 10:06 AM

## 2024-05-08 NOTE — Op Note (Signed)
 OPERATIVE NOTE  DATE OF PROCEDURE: 05/08/2024  SURGEONS:  Primary: Alyse Agent, MD  ASSISTANT: Heather Quale, PA-C  Due to the complexity of the surgery an assistant was necessary to aid in retraction, exposure, limb positioning, closure and dressing application. The use of an assistant on this case follows CMS and CPT guidelines, which allows an assistant to be used because of the complexity level of this case.   PREOPERATIVE DIAGNOSIS: LEFT UPPER EXTREMITY WOUND, infection, iv drug abuse  POSTOPERATIVE DIAGNOSIS: Same  NAME OF PROCEDURE: Left medial proximal forearm and upper arm I&D of abscess, olecranon bursectomy and ulnar nerve neurolysis, flexor pronator mass tendon tenolysis, incision and drainage of distal biceps tendon sheath.  ANESTHESIA: General  SKIN PREPARATION: Hibiclens   ESTIMATED BLOOD LOSS: Minimal  IMPLANTS: none  INDICATIONS:  Beau is a 38 y.o. male who has the above preoperative diagnosis. The patient has decided to proceed with surgical intervention.  Risks, benefits and alternatives of operative management were discussed including, but not limited to, risks of anesthesia complications, infection, pain, persistent symptoms, stiffness, need for future surgery.  The patient understands, agrees and elects to proceed with surgery.    DESCRIPTION OF PROCEDURE: The patient was met in the pre-operative area and their identity was verified.  The operative location and laterality was also verified and marked.  The patient was brought to the OR and was placed supine on the table.  After repeat patient identification with the operative team anesthesia was provided and the patient was prepped and draped in the usual sterile fashion.  A final timeout was performed verifying the correction patient, procedure, location and laterality.  The left upper extremity was elevated and tourniquet inflated to 250 m of mercury.  A longitudinal approach was made to the medial proximal  forearm.  Dissection was carried down to the level of the abscess and this was immediately encountered.  Gross purulence was identified and cultures were obtained.  The abscess was evacuated and I&D was carried out to the upper arm.  At this time the distal bicep tendon sheath was also infected and this was incised and completely released and drained.  The flexor pronator tendon was identified and tenolysis was performed in this area removing surrounding adhesions from the overlying infection.  The ulnar nerve was identified within the depths of the wound and neurolysis was performed.  The olecranon bursa was also infected and complete bursectomy was performed by evacuating the abscess and decompressing the bursa.  The wounds were thoroughly irrigated with 3 L of normal saline via low flow cystoscopy tubing.  The wound was then packed and left open.  This was sterilely dressed.  The tourniquet was deflated and hemostasis was obtained.  A sterile soft bandage was applied.  The compartments of the forearm and upper arm are soft and compressible.  The hand was soft and supple.  After deflation of the tourniquet all digits were pink warm and well-perfused with brisk capillary fill.  All counts were correct x 2.  The patient tolerated the procedure well, was awoken from anesthesia and brought to PACU for recovery in stable condition.   Agent Robynn Alyse, MD

## 2024-05-08 NOTE — Sepsis Progress Note (Signed)
 Sepsis protocol monitored by eLink

## 2024-05-08 NOTE — Progress Notes (Signed)
 Pharmacy Antibiotic Note  Justin Fisher is a 38 y.o. male admitted on 05/08/2024 with sepsis due to cellulitis / abscess.  Pharmacy has been consulted for vancomycin  / zosyn  dosing.  Scr 0.57, WBC 20.4, L acid 0.7  Weight ~ 75 kg   Plan: Vancomycin  1750 mg x1 followed by 1250 mg q12h for AUC of 460  Zosyn  3.375g IV q8h (4 hour infusion).    Temp (24hrs), Avg:98.1 F (36.7 C), Min:98.1 F (36.7 C), Max:98.1 F (36.7 C)  Recent Labs  Lab 05/08/24 0731 05/08/24 0745  WBC 20.4*  --   CREATININE 0.57*  --   LATICACIDVEN  --  0.7    CrCl cannot be calculated (Unknown ideal weight.).    No Known Allergies  Antimicrobials this admission: Vancomycin  11/3 >> c Zosyn  11/3 >> c  Microbiology results: 11/3 BCx: p  Thank you for allowing pharmacy to be a part of this patient's care.  Rankin Sams 05/08/2024 8:25 AM

## 2024-05-08 NOTE — H&P (Signed)
 History and Physical    Patient: Justin Fisher FMW:[id number] DOB: [birth year] DOA: 05/08/2024 DOS: the patient was seen and examined on 05/08/2024 PCP: Billy Philippe SAUNDERS, NP  Patient coming from: home  Chief Complaint:  Chief Complaint  Patient presents with   Wound Infection   HPI: Justin Fisher is a 38 y.o. male with medical history significant of cellulitis, fasciitis, hepatitis C, anxiety, depression, IV drug abuse(heroin, fentanyl , and methamphetamine), tobacco abuse presents with an infected arm following heroin injection.  The infection in his arm began approximately ten days ago after a heroin injection in the crease of his arm. Initially, a small bump was noticed last Friday, which rapidly progressed to significant swelling and redness by Saturday. The swelling worsened over two to three days, with the arm becoming as swollen as a 'football'. The infection site has started to leak, and the patient stated he did not want to touch it.  He is currently on methadone  and uses heroin approximately every three weeks, a reduction from previous more frequent use. He attributes this improvement to the methadone  treatment, which he feels 'holds him throughout the day'. He wants to distance himself from his current living situation and social circle to aid in his recovery.  No fever or chills.  In the ED patient was noted to be afebrile with stable vital signs.  Labs significant for WBC 20.4, hemoglobin 10.1, platelets 524, sodium 129, CO2 21, BUN 7, creatinine 0.57, and lactic acid 0.7. CT scan of the upper extremity was obtained and official radiology read was pending.  Orthopedics was consulted and plan on likely taking patient to the operating room.  Blood cultures were obtained.  Patient was given 500 mL bolus of lactated Ringer's, morphine  4 mg IV, vancomycin , and Zosyn .  Review of Systems: As mentioned in the history of present illness. All other systems reviewed and are  negative. Past Medical History:  Diagnosis Date   Alcohol abuse    Anxiety    Depression    Drug abuse (HCC)    Suicidal thoughts    Past Surgical History:  Procedure Laterality Date   APPLICATION OF WOUND VAC Right 07/10/2021   Procedure: APPLICATION OF WOUND VAC;  Surgeon: Doll Skates, MD;  [facility];  Service: Orthopedics;  Laterality: Right;   APPLICATION OF WOUND VAC Right 07/18/2021   Procedure: WOUND VAC CHANGE;  Surgeon: Lowery Estefana RAMAN, DO;  [facility];  Service: Plastics;  Laterality: Right;   APPLICATION OF WOUND VAC Right 07/22/2021   Procedure: WOUND VAC CHANGE;  Surgeon: Marene Sieving, MD;  [facility];  Service: Plastics;  Laterality: Right;   I & D EXTREMITY Right 07/10/2021   Procedure: IRRIGATION AND DEBRIDEMENT RIGHT UPPER ARM;  Surgeon: Doll Skates, MD;  [facility];  Service: Orthopedics;  Laterality: Right;   I & D EXTREMITY Right 07/13/2021   Procedure: IRRIGATION AND DEBRIDEMENT UPPER EXTREMITY;  Surgeon: Sheril Coy, MD;  [facility];  Service: Orthopedics;  Laterality: Right;   I & D EXTREMITY N/A 07/16/2021   Procedure: IRRIGATION AND DEBRIDEMENT EXTREMITY AND  VAC CHANGE;  Surgeon: Doll Skates, MD;  [facility];  Service: Orthopedics;  Laterality: N/A;   I & D EXTREMITY Right 07/29/2021   Procedure: IRRIGATION AND DEBRIDEMENT EXTREMITY;  Surgeon: Marene Sieving, MD;  [facility];  Service: Plastics;  Laterality: Right;   INCISION AND DRAINAGE OF WOUND Right 07/18/2021   Procedure: IRRIGATION AND DEBRIDEMENT RIGHT ARM WOUND;  Surgeon:  Dillingham, Estefana RAMAN, DO;  [facility];  Service: Plastics;  Laterality: Right;   INCISION AND DRAINAGE OF WOUND Right 07/22/2021   Procedure: IRRIGATION AND DEBRIDEMENT RIGHT ARM  WOUND;  Surgeon: Marene Sieving, MD;  [facility];  Service: Plastics;  Laterality: Right;   SHOULDER SURGERY Left    SKIN SPLIT GRAFT Right 07/29/2021   Procedure: SKIN GRAFT SPLIT THICKNESS FROM  RIGHT UPPER THIGH TO RIGHT UPPER ARM;  Surgeon: Marene Sieving, MD;  [facility];  Service: Plastics;  Laterality: Right;   Social History:  reports that he has been smoking cigarettes. He has never used smokeless tobacco. He reports that he does not currently use alcohol. He reports current drug use. Drug: IV.  No Known Allergies  Family History  Problem Relation Age of Onset   Cancer Mother        Lung   Early death Mother    Diabetes Father    Depression Father    Diabetes Maternal Grandmother    Diabetes Maternal Grandfather    Diabetes Paternal Grandmother    Diabetes Paternal Grandfather     Prior to Admission medications   Medication Sig Start Date End Date Taking? Authorizing Provider  acetaminophen  (TYLENOL ) 325 MG tablet Take 2 tablets (650 mg total) by mouth every 6 (six) hours as needed for mild pain (or Fever >/= 101). 08/01/21   Alto Isaiah CROME, NP  gabapentin  (NEURONTIN ) 400 MG capsule Take 1 capsule (400 mg total) by mouth 3 (three) times daily. For agitation 08/01/21   Alto Isaiah CROME, NP  gabapentin  (NEURONTIN ) 800 MG tablet Take 800 mg by mouth 3 (three) times daily. 12/03/23   [provider]  hydrOXYzine  (ATARAX ) 50 MG tablet Take 1 tablet (50 mg total) by mouth every 6 (six) hours as needed for anxiety. 08/01/21   Alto Isaiah CROME, NP  methadone  (DOLOPHINE ) 5 MG tablet Take 11 tablets (55 mg total) by mouth daily. 08/02/21   Alto Isaiah CROME, NP  mirtazapine  (REMERON ) 30 MG tablet Take 1 tablet (30 mg total) by mouth at bedtime. 08/01/21   Alto Isaiah CROME, NP  sertraline  (ZOLOFT ) 100 MG tablet Take 2 tablets (200 mg total) by mouth daily. 08/05/21   Alto Isaiah CROME, NP  Sertraline  HCl (ZOLOFT  PO) Take 150 mg by mouth. Take one tablet daily    [provider]    Physical Exam: Vitals:   05/08/24 0726 05/08/24 0831  BP: 121/81   Pulse: 99   Resp: 18   Temp: 98.1 F (36.7 C)   SpO2: 97%   Weight:  77.1 kg  Height:  5' 7 (1.702 m)     Constitutional: Middle-age male who appears to be in some discomfort Eyes: PERRL, lids and conjunctivae normal ENMT: Mucous membranes are moist.  Poor dentition Neck: normal, supple Respiratory: clear to auscultation bilaterally, no wheezing, no crackles. Normal respiratory effort. No accessory muscle use.  Cardiovascular: Regular rate and rhythm, no murmurs / rubs / gallops. No extremity edema. 2+ pedal pulses. No carotid bruits.  Abdomen: no tenderness, no masses palpated.  Bowel sounds positive.  Musculoskeletal: no clubbing / cyanosis. .  Skin: Prior skin grafts noted of the right arm. Erythema present of the left arm with significant swelling and drainage present near antecubital fossa.  Track marks present.  Neurologic: CN 2-12 grossly intact. Sensation intact, DTR normal. Strength 5/5 in all 4.  Psychiatric: Normal judgment and insight. Alert and oriented x 3. Normal mood.   Data  Reviewed:  EKG revealed sinus rhythm at 81 bpm with QTc 481. reviewed labs, imaging, pertinent records as documented.  Assessment and Plan:  Cellulitis and abscess of the left forearm Acute.  Patient presents with redness, swelling, and pain of the left forearm after injecting IV drugs approximately 10 days ago.  Was started on empiric antibiotics of vancomycin  and Zosyn .  CT scan of the left upper extremity obtained with concerns for abscess for which orthopedics was consulted. - Admit to a telemetry bed - N.p.o. for possible need of procedure - Follow-up blood cultures - Follow-up official read of CT the left upper extremity by radiology - Continue empiric antibiotics of vancomycin  and Zosyn  - Oxycodone  as needed for pain - Appreciate orthopedic consultative services, will follow-up for any further recommendations  Leukocytosis Acute.  WBC elevated at 20.4.  Lactic acid was reassuring at 0.7. - Follow-up CRP - Recheck CBC tomorrow morning  Hyponatremia Acute.  Sodium noted to be 129.   -  Continue to monitor  Microcytic anemia Hemoglobin 10.1 with MCV 72.5 which appears similar to priors. - Recheck CBC tomorrow morning  Hepatitis C Patient has a history of hepatitis C with quantitative RNA noted to be 11,200,000 IU/ml - Recommend outpatient follow-up with ID  Polysubstance abuse IV drug user Patient has a history of IV fentanyl , heroin, and methamphetamine use.  Also reports tobacco abuse.  Currently on methadone  which patient reports is helping decrease his use of IV drug abuse.  Last HIV noted to be negative when checked on 12/2023. - Check HIV  - Continue methadone  - Nicotine  gum as needed - Transitions of care consulted for drug abuse resources   DVT prophylaxis: Lovenox Advance Care Planning:   Code Status: Full Code   Consults: Orthopedics  Family Communication: None  Severity of Illness: The appropriate patient status for this patient is INPATIENT. Inpatient status is judged to be reasonable and necessary in order to provide the required intensity of service to ensure the patient's safety. The patient's presenting symptoms, physical exam findings, and initial radiographic and laboratory data in the context of their chronic comorbidities is felt to place them at high risk for further clinical deterioration. Furthermore, it is not anticipated that the patient will be medically stable for discharge from the hospital within 2 midnights of admission.   * I certify that at the point of admission it is my clinical judgment that the patient will require inpatient hospital care spanning beyond 2 midnights from the point of admission due to high intensity of service, high risk for further deterioration and high frequency of surveillance required.*  Author: Maximino DELENA Sharps, MD 05/08/2024 10:11 AM  For on call review [email or web address].

## 2024-05-08 NOTE — Anesthesia Procedure Notes (Signed)
 Procedure Name: Intubation Date/Time: 05/08/2024 6:19 PM  Performed by: Atanacio Arland HERO, CRNAPre-anesthesia Checklist: Patient identified, Emergency Drugs available, Suction available and Patient being monitored Patient Re-evaluated:Patient Re-evaluated prior to induction Oxygen Delivery Method: Circle System Utilized Preoxygenation: Pre-oxygenation with 100% oxygen Induction Type: IV induction Ventilation: Mask ventilation without difficulty Laryngoscope Size: Mac and 4 Grade View: Grade I Tube type: Oral Tube size: 7.5 mm Number of attempts: 1 Airway Equipment and Method: Stylet Placement Confirmation: ETT inserted through vocal cords under direct vision, positive ETCO2 and breath sounds checked- equal and bilateral Secured at: 22 cm Tube secured with: Tape Dental Injury: Teeth and Oropharynx as per pre-operative assessment

## 2024-05-08 NOTE — ED Triage Notes (Signed)
 Pt here for swelling and redness of left arm starting Friday. Pt has abscess in left AC with redness surrounding the site. Swelling from upper arm down to wrist at this time.

## 2024-05-08 NOTE — Transfer of Care (Signed)
 Immediate Anesthesia Transfer of Care Note  Patient: Justin Fisher  Procedure(s) Performed: IRRIGATION AND DEBRIDEMENT WOUND (Left: Arm Upper)  Patient Location: PACU  Anesthesia Type:General  Level of Consciousness: drowsy  Airway & Oxygen Therapy: Patient Spontanous Breathing and Patient connected to face mask oxygen  Post-op Assessment: Report given to RN and Post -op Vital signs reviewed and stable  Post vital signs: Reviewed and stable  Last Vitals:  Vitals Value Taken Time  BP 120/63 05/08/24 18:55  Temp 98.6   Pulse 81 05/08/24 18:57  Resp 9 05/08/24 18:57  SpO2 99 % 05/08/24 18:57  Vitals shown include unfiled device data.  Last Pain:  Vitals:   05/08/24 1738  TempSrc: Oral  PainSc: 5       Patients Stated Pain Goal: 2 (05/08/24 1738)  Complications: No notable events documented.

## 2024-05-08 NOTE — ED Notes (Signed)
 Extra SST, Red & DG tubes drawn

## 2024-05-08 NOTE — ED Provider Notes (Signed)
 [place] EMERGENCY DEPARTMENT AT [facility] Provider Note   CSN: [id number] Arrival date & time: 05/08/24  9277     Patient presents with: Wound Infection   Justin Fisher is a 38 y.o. male.   Patient is a 38 year old male with a history of polysubstance abuse who presents with possible infection in his left arm.  He says that he was doing well and was clean from drugs for a while and has been on methadone .  About 10 days ago, he relapsed and injected into his left arm.  He said he has not injected anything since that time.  About 3 days ago he noticed a bump in his left antecubital fossa area.  Over the last couple days its grown and he has had redness and pain and swelling in his left arm that extended down toward his hand and up toward his shoulder.  He denies any known fevers.  He was admitted in 2023 for presumed sepsis related to an extensive hand infection in his right arm.  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       Prior to Admission medications   Medication Sig Start Date End Date Taking? Authorizing Provider  cloNIDine  (CATAPRES ) 0.1 MG tablet Take 0.1 mg by mouth 3 (three) times daily. 04/06/24  Yes [provider]  COLACE 2-IN-1 8.6-50 MG tablet Take 1 tablet by mouth daily. 04/03/24  Yes [provider]  acetaminophen  (TYLENOL ) 325 MG tablet Take 2 tablets (650 mg total) by mouth every 6 (six) hours as needed for mild pain (or Fever >/= 101). 08/01/21   Alto Isaiah CROME, NP  gabapentin  (NEURONTIN ) 400 MG capsule Take 1 capsule (400 mg total) by mouth 3 (three) times daily. For agitation 08/01/21   Alto Isaiah CROME, NP  gabapentin  (NEURONTIN ) 800 MG tablet Take 800 mg by mouth 3 (three) times daily. 12/03/23   [provider]  hydrOXYzine  (ATARAX ) 50 MG tablet Take 1 tablet (50 mg total) by mouth every 6 (six) hours as needed  for anxiety. 08/01/21   Alto Isaiah CROME, NP  methadone  (DOLOPHINE ) 5 MG tablet Take 11 tablets (55 mg total) by mouth daily. 08/02/21   Alto Isaiah CROME, NP  mirtazapine  (REMERON ) 30 MG tablet Take 1 tablet (30 mg total) by mouth at bedtime. 08/01/21   Alto Isaiah CROME, NP  sertraline  (ZOLOFT ) 100 MG tablet Take 2 tablets (200 mg total) by mouth daily. 08/05/21   Alto Isaiah CROME, NP  Sertraline  HCl (ZOLOFT  PO) Take 150 mg by mouth. Take one tablet daily    [provider]    Allergies: Patient has no known allergies.    Review of Systems  Constitutional:  Negative for fever.  HENT: Negative.    Respiratory:  Negative for cough and shortness of breath.   Cardiovascular:  Negative for chest pain.  Gastrointestinal:  Negative for abdominal pain, nausea and vomiting.  Musculoskeletal:  Positive for arthralgias and myalgias.  Skin:  Positive for wound.  Neurological:  Negative for dizziness and headaches.    Updated Vital Signs BP 121/81   Pulse 99   Temp 99.2 F (37.3 C) (Oral)   Resp 18   Ht 5' 7 (1.702 m)   Wt 77.1 kg   SpO2 97%   BMI 26.63 kg/m   Physical Exam Constitutional:      Appearance: He is well-developed.  HENT:  Head: Normocephalic and atraumatic.  Eyes:     Pupils: Pupils are equal, round, and reactive to light.  Cardiovascular:     Rate and Rhythm: Normal rate and regular rhythm.     Heart sounds: Normal heart sounds.  Pulmonary:     Effort: Pulmonary effort is normal. No respiratory distress.     Breath sounds: Normal breath sounds. No wheezing or rales.  Chest:     Chest wall: No tenderness.  Abdominal:     General: Bowel sounds are normal.     Palpations: Abdomen is soft.     Tenderness: There is no abdominal tenderness. There is no guarding or rebound.  Musculoskeletal:        General: Normal range of motion.     Cervical back: Normal range of motion and neck supple.     Comments: Positive swelling and induration to the left antecubital  area extending down to the left forearm.  His left forearm is tense.  There is erythema extending up to the left upper arm.  Radial pulses intact.  He has pain with movement of the arm and movement of the hand although his fingers move freely.  No significant swelling or redness to the hand.  Lymphadenopathy:     Cervical: No cervical adenopathy.  Skin:    General: Skin is warm and dry.     Findings: No rash.  Neurological:     Mental Status: He is alert and oriented to person, place, and time.     (all labs ordered are listed, but only abnormal results are displayed) Labs Reviewed  COMPREHENSIVE METABOLIC PANEL WITH GFR - Abnormal; Notable for the following components:      Result Value   Sodium 129 (*)    Chloride 97 (*)    CO2 21 (*)    Glucose, Bld 143 (*)    Creatinine, Ser 0.57 (*)    Calcium 8.5 (*)    Albumin 3.0 (*)    All other components within normal limits  CBC WITH DIFFERENTIAL/PLATELET - Abnormal; Notable for the following components:   WBC 20.4 (*)    Hemoglobin 10.1 (*)    HCT 33.0 (*)    MCV 72.5 (*)    MCH 22.2 (*)    RDW 18.3 (*)    Platelets 524 (*)    Neutro Abs 14.0 (*)    Lymphs Abs 4.2 (*)    Monocytes Absolute 1.8 (*)    Abs Immature Granulocytes 0.16 (*)    All other components within normal limits  C-REACTIVE PROTEIN - Abnormal; Notable for the following components:   CRP 16.7 (*)    All other components within normal limits  CULTURE, BLOOD (ROUTINE X 2)  CULTURE, BLOOD (ROUTINE X 2)  PROTIME-INR  HIV ANTIBODY (ROUTINE TESTING W REFLEX)  HEPATITIS PANEL, ACUTE  I-STAT CG4 LACTIC ACID, ED    EKG: None  Radiology: No results found.   Procedures   Medications Ordered in the ED  lactated ringers  infusion ( Intravenous New Bag/Given 05/08/24 0902)  piperacillin -tazobactam (ZOSYN ) IVPB 3.375 g (3.375 g Intravenous New Bag/Given 05/08/24 0901)  vancomycin  (VANCOREADY) IVPB 1750 mg/350 mL (1,750 mg Intravenous New Bag/Given 05/08/24 0955)   vancomycin  (VANCOREADY) IVPB 1250 mg/250 mL (has no administration in time range)  enoxaparin (LOVENOX) injection 40 mg (has no administration in time range)  sodium chloride  flush (NS) 0.9 % injection 3 mL (has no administration in time range)  ondansetron  (ZOFRAN ) tablet 4 mg (has no administration in  time range)    Or  ondansetron  (ZOFRAN ) injection 4 mg (has no administration in time range)  lactated ringers  bolus 500 mL (0 mLs Intravenous Stopped 05/08/24 0926)  morphine  (PF) 4 MG/ML injection 4 mg (4 mg Intravenous Given 05/08/24 0952)  iohexol  (OMNIPAQUE ) 350 MG/ML injection 75 mL (75 mLs Intravenous Contrast Given 05/08/24 0930)    Clinical Course as of 05/08/24 1153  Mon May 08, 2024  9170 Discussed with Ozell Purchase, PA-C who will consult on pt.  Awaiting CT. [MB]    Clinical Course User Index [MB] Lenor Hollering, MD                                 Medical Decision Making Amount and/or Complexity of Data Reviewed Labs: ordered. Radiology: ordered.  Risk Prescription drug management. Decision regarding hospitalization.   This patient presents to the ED for concern of left arm infection, this involves an extensive number of treatment options, and is a complaint that carries with it a high risk of complications and morbidity.  I considered the following differential and admission for this acute, potentially life threatening condition.  The differential diagnosis includes cellulitis, abscess, necrotizing fasciitis, osteomyelitis, retained foreign body  MDM:    Patient is a 38 year old who presents rapidly progressing swelling and pain to his left arm.  His arm is tense and he has some induration around the [facility] area.  He is not febrile but he is tachycardic with a white count of 20,000.  Lactic acid is normal.  Sepsis protocol was initiated with IV fluids and IV antibiotics.  Will order a CT of his arm and consult orthopedics.  CT scan shows a fluid pocket in his antecubital  fossa area with fluid extending down into his forearm.  Orthopedic surgery has reviewed and is concerned about necrotizing fasciitis.  Will likely go to the operating room today.  Request medicine admission.  Discussed with Dr. Claudene who will admit the patient for further treatment.  CRITICAL CARE Performed by: Hollering Lenor Total critical care time: 70 minutes Critical care time was exclusive of separately billable procedures and treating other patients. Critical care was necessary to treat or prevent imminent or life-threatening deterioration. Critical care was time spent personally by me on the following activities: development of treatment plan with patient and/or surrogate as well as nursing, discussions with consultants, evaluation of patient's response to treatment, examination of patient, obtaining history from patient or surrogate, ordering and performing treatments and interventions, ordering and review of laboratory studies, ordering and review of radiographic studies, pulse oximetry and re-evaluation of patient's condition.   (Labs, imaging, consults)  Labs: I Ordered, and personally interpreted labs.  The pertinent results include: Elevated WBC count, normal lactic acid, normal creatinine  Imaging Studies ordered: I ordered imaging studies including CT left arm I independently visualized and interpreted imaging. I agree with the radiologist interpretation  Additional history obtained from chart.  External records from outside source obtained and reviewed including prior notes  Cardiac Monitoring: The patient was maintained on a cardiac monitor.  If on the cardiac monitor, I personally viewed and interpreted the cardiac monitored which showed an underlying rhythm of: Sinus tachycardia  Reevaluation: After the interventions noted above, I reevaluated the patient and found that they have :improved  Social Determinants of Health:  substance abuse  Disposition: Admit to  hospital  Co morbidities that complicate the patient evaluation  Past Medical History:  Diagnosis Date   Alcohol abuse    Anxiety    Depression    Drug abuse (HCC)    Suicidal thoughts      Medicines Meds ordered this encounter  Medications   lactated ringers  infusion   lactated ringers  bolus 500 mL    Reason 30 mL/kg dose is not being ordered:   Non-Septic Shock Clinical Presentation   piperacillin -tazobactam (ZOSYN ) IVPB 3.375 g    Antibiotic Indication::   Cellulitis   vancomycin  (VANCOREADY) IVPB 1750 mg/350 mL    Indication::   Cellulitis   vancomycin  (VANCOREADY) IVPB 1250 mg/250 mL    Indication::   Cellulitis   morphine  (PF) 4 MG/ML injection 4 mg   iohexol  (OMNIPAQUE ) 350 MG/ML injection 75 mL   enoxaparin (LOVENOX) injection 40 mg   sodium chloride  flush (NS) 0.9 % injection 3 mL   OR Linked Order Group    ondansetron  (ZOFRAN ) tablet 4 mg    ondansetron  (ZOFRAN ) injection 4 mg    I have reviewed the patients home medicines and have made adjustments as needed  Problem List / ED Course: Problem List Items Addressed This Visit   None            Final diagnoses:  None    ED Discharge Orders     None          Lenor Hollering, MD 05/08/24 1153

## 2024-05-08 NOTE — Anesthesia Preprocedure Evaluation (Addendum)
 Anesthesia Evaluation  Patient identified by MRN, date of birth, ID band Patient awake    Reviewed: Allergy & Precautions, NPO status , Patient's Chart, lab work & pertinent test results  Airway Mallampati: III  TM Distance: >3 FB Neck ROM: Full    Dental no notable dental hx.    Pulmonary Current SmokerPatient did not abstain from smoking.   Pulmonary exam normal        Cardiovascular negative cardio ROS Normal cardiovascular exam     Neuro/Psych  PSYCHIATRIC DISORDERS Anxiety Depression     Neuromuscular disease    GI/Hepatic negative GI ROS,,,(+)     substance abuse  , Hepatitis -, C  Endo/Other  hyponatremia  Renal/GU negative Renal ROS     Musculoskeletal  (+)  narcotic dependent  Abdominal   Peds  Hematology  (+) Blood dyscrasia, anemia   Anesthesia Other Findings LEFT UPPER EXTREMITY WOUND  Reproductive/Obstetrics                              Anesthesia Physical Anesthesia Plan  ASA: 3  Anesthesia Plan: General   Post-op Pain Management: Ketamine  IV*, Precedex  and Dilaudid  IV   Induction: Intravenous  PONV Risk Score and Plan: 1 and Ondansetron , Dexamethasone , Midazolam  and Treatment may vary due to age or medical condition  Airway Management Planned:   Additional Equipment:   Intra-op Plan:   Post-operative Plan: Extubation in OR  Informed Consent: I have reviewed the patients History and Physical, chart, labs and discussed the procedure including the risks, benefits and alternatives for the proposed anesthesia with the patient or authorized representative who has indicated his/her understanding and acceptance.     Dental advisory given  Plan Discussed with: CRNA  Anesthesia Plan Comments:          Anesthesia Quick Evaluation

## 2024-05-08 NOTE — Progress Notes (Signed)
 LUE I&D  -Remove dressing tomorrow, start soapy soaks with CHG and NS or sterile water for 10-15 minutes every 8 hours. Allow to air dry. Pack wound with iodoform. Cover with non-adherent, 4x4s, light wrap, ace bandage.  -Elevate, encourage finger range of motion -Trend CBC  -Recommend infectious disease for antibiotic management and psychiatry consult for substance use -Follow up in the office 1 week post op

## 2024-05-08 NOTE — ED Notes (Signed)
 Pt to CT

## 2024-05-09 ENCOUNTER — Encounter (HOSPITAL_COMMUNITY): Payer: Self-pay | Admitting: Orthopedic Surgery

## 2024-05-09 DIAGNOSIS — D75839 Thrombocytosis, unspecified: Secondary | ICD-10-CM | POA: Diagnosis not present

## 2024-05-09 DIAGNOSIS — L02414 Cutaneous abscess of left upper limb: Secondary | ICD-10-CM

## 2024-05-09 DIAGNOSIS — L03119 Cellulitis of unspecified part of limb: Secondary | ICD-10-CM | POA: Diagnosis not present

## 2024-05-09 DIAGNOSIS — B182 Chronic viral hepatitis C: Secondary | ICD-10-CM

## 2024-05-09 DIAGNOSIS — L02419 Cutaneous abscess of limb, unspecified: Secondary | ICD-10-CM | POA: Diagnosis not present

## 2024-05-09 DIAGNOSIS — D72829 Elevated white blood cell count, unspecified: Secondary | ICD-10-CM | POA: Diagnosis not present

## 2024-05-09 DIAGNOSIS — B9689 Other specified bacterial agents as the cause of diseases classified elsewhere: Secondary | ICD-10-CM | POA: Diagnosis not present

## 2024-05-09 LAB — COMPREHENSIVE METABOLIC PANEL WITH GFR
ALT: 20 U/L (ref 0–44)
AST: 25 U/L (ref 15–41)
Albumin: 2.4 g/dL — ABNORMAL LOW (ref 3.5–5.0)
Alkaline Phosphatase: 80 U/L (ref 38–126)
Anion gap: 9 (ref 5–15)
BUN: 7 mg/dL (ref 6–20)
CO2: 24 mmol/L (ref 22–32)
Calcium: 8.1 mg/dL — ABNORMAL LOW (ref 8.9–10.3)
Chloride: 104 mmol/L (ref 98–111)
Creatinine, Ser: 0.61 mg/dL (ref 0.61–1.24)
GFR, Estimated: >60 mL/min (ref >60)
Glucose, Bld: 135 mg/dL — ABNORMAL HIGH (ref 70–99)
Potassium: 3.9 mmol/L (ref 3.5–5.1)
Sodium: 137 mmol/L (ref 135–145)
Total Bilirubin: 0.3 mg/dL (ref 0.0–1.2)
Total Protein: 6.7 g/dL (ref 6.5–8.1)

## 2024-05-09 LAB — RAPID URINE DRUG SCREEN, HOSP PERFORMED
Amphetamines: NOT DETECTED
Barbiturates: NOT DETECTED
Benzodiazepines: POSITIVE — AB
Cocaine: NOT DETECTED
Opiates: NOT DETECTED
Tetrahydrocannabinol: NOT DETECTED

## 2024-05-09 LAB — CBC
HCT: 28.6 % — ABNORMAL LOW (ref 39.0–52.0)
Hemoglobin: 8.7 g/dL — ABNORMAL LOW (ref 13.0–17.0)
MCH: 22.4 pg — ABNORMAL LOW (ref 26.0–34.0)
MCHC: 30.4 g/dL (ref 30.0–36.0)
MCV: 73.7 fL — ABNORMAL LOW (ref 80.0–100.0)
Platelets: 451 K/uL — ABNORMAL HIGH (ref 150–400)
RBC: 3.88 MIL/uL — ABNORMAL LOW (ref 4.22–5.81)
RDW: 18.4 % — ABNORMAL HIGH (ref 11.5–15.5)
WBC: 13.2 K/uL — ABNORMAL HIGH (ref 4.0–10.5)
nRBC: 0 % (ref 0.0–0.2)

## 2024-05-09 LAB — HIV ANTIBODY (ROUTINE TESTING W REFLEX): HIV Screen 4th Generation wRfx: NONREACTIVE

## 2024-05-09 MED ORDER — NICOTINE POLACRILEX 2 MG MT GUM
4.0000 mg | CHEWING_GUM | OROMUCOSAL | Status: DC | PRN
  Administered 2024-05-09 – 2024-05-14 (×20): 4 mg via ORAL
  Filled 2024-05-09 (×23): qty 2

## 2024-05-09 MED ORDER — MIRTAZAPINE 15 MG PO TABS
30.0000 mg | ORAL_TABLET | Freq: Every day | ORAL | Status: DC
  Administered 2024-05-09 – 2024-05-13 (×5): 30 mg via ORAL
  Filled 2024-05-09 (×8): qty 2

## 2024-05-09 MED ORDER — NICOTINE 14 MG/24HR TD PT24
14.0000 mg | MEDICATED_PATCH | Freq: Every day | TRANSDERMAL | Status: DC
  Administered 2024-05-09 – 2024-05-14 (×6): 14 mg via TRANSDERMAL
  Filled 2024-05-09 (×6): qty 1

## 2024-05-09 NOTE — Discharge Instructions (Signed)
 Counseling Resources:  [facility] [address]  [place], [place] [address]        [phone]     [facility]  [phone] [address] [place]  [place], [place] [address] [phone]  [facility] [address], [place], [place]  [phone]    [facility] [address] [place], [place] [phone]  [facility]:  [phone] [address] [place]  [facility]:  [address] [place], [place] [phone]  [facility] (under & uninsured) [address]   [place] [phone]    [email or web address]    [facility] -[address]     Phone:  [phone] [facility]-  [address]  [phone]    [facility] [address] [place], [place] [phone]  *[facility]: [address] [place], [place], [place]   [phone]   [facility] (Spanish therapist) [address] [place]  [address] [place] [address] [phone]    [facility]  [phone]  [address],  [place], [place]    [place] [phone] [address] [place]   [facility] [place]  [address] [place], [place]        [phone]  [facility] [place], [address] [place] [phone]):   Mon - Fri from 8 AM - 3 PM  [facility] [address], [address], [place]: [phone]) 8:30 - 12; 1 - 2:30 Accepts Medicaid   [facility] [employer], [address] [place], [place] ([phone]):8:30 - 12; 2 - 3PM  [facility] [employer], [address] [place]; [phone]):   Mon - Fri 8 AM - 5 PM  *[facility] [address] [place]  MWF 12:30 to 3:00 or  call to schedule an appointment [phone]  Specific  Provider options Psychology Today  https://www.psychologytoday.com/us click on find a therapist  enter your zip code left side and select or tailor a therapist for your specific need.   [facility] Provider Directory [email or web address]  (Medicaid)  Follow all drop down to find a provider  Social Support program [facility] [phone] or [employer] [address], [place], [place] Recovery support and educational  In home counseling [facility] Telephone: [phone]  office in [facility] [email or web address]   private insurance [place],  health Choice, [place], [place], [place], [place], [place] Health Choice   24- Hour Availability:  [facility]  [phone] or [phone]  [facility] Service of the [facility] [phone]  [facility]  [phone]   [facility]  [phone] (after hours)  Therapeutic Alternative/Mobile Crisis   [phone]  [place] National Suicide Hotline  [phone] Len Childs)  Call 911 or go to emergency room  [facility]  [phone]);  [place] and [employer]  [phone]); [place], [place], [place], [place], [place], [place], [place]

## 2024-05-09 NOTE — TOC Initial Note (Signed)
 Transition of Care [facility]) - Initial/Assessment Note    Patient Details  Name: Justin Fisher MRN: [id number] Date of Birth: September 02, 1985  [facility]) CM/SW Contact:    Lendia Dais, LCSWA Phone Number: 05/09/2024, 11:02 AM  Clinical Narrative:  Pt is from home and lives with roommates. Pt is independent with ADL's and has no DME.  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 Pt stated that he has been in circumstance where he was not able to get food but now has food stamps and travels to local food pantries.      Pt expressed concerns about keeping up with their psych medication. CSW offered to provide resources for [facility] and pt accepted. Resources in AV[place].  Pt has seen PCP Philippe Slade in the last year. Pt expressed no further needs at home and stated they would need transportation home, CSW inform the pt that they could get a cab voucher from the DC lounge when that time comes.  No further needs.               Expected Discharge Plan: Home/Self Care Barriers to Discharge: Continued Medical Work up   Patient Goals and CMS Choice Patient states their goals for this hospitalization and ongoing recovery are:: Maintaining sobriety and being safe   Choice offered to / list presented to : NA      Expected Discharge Plan and Services In-house Referral: Clinical Social Work     Living arrangements for the past 2 months: Single Family Home                                      Prior Living Arrangements/Services Living arrangements for the past 2 months: Single Family Home Lives with:: Roommate Patient language and need for interpreter reviewed:: Yes Do you feel safe going back to the place where you live?: Yes      Need for Family Participation in Patient Care: No (Comment) Care giver support system in place?: No (comment)   Criminal Activity/Legal Involvement Pertinent to  Current Situation/Hospitalization: No - Comment as needed  Activities of Daily Living   ADL Screening (condition at time of admission) Independently performs ADLs?: Yes (appropriate for developmental age) Is the patient deaf or have difficulty hearing?: No Does the patient have difficulty seeing, even when wearing glasses/contacts?: No Does the patient have difficulty concentrating, remembering, or making decisions?: No  Permission Sought/Granted   Permission granted to share information with : No              Emotional Assessment Appearance:: Appears stated age Attitude/Demeanor/Rapport: Engaged Affect (typically observed): Appropriate, Pleasant Orientation: : Oriented to Self, Oriented to Situation, Oriented to Place, Oriented to  Time Alcohol / Substance Use: Illicit Drugs Psych Involvement: Outpatient Provider  Admission diagnosis:  Cellulitis and abscess of upper extremity [L03.119, L02.419] Patient Active Problem List   Diagnosis Date Noted   Cellulitis and abscess of upper extremity 05/08/2024   Leukocytosis 05/08/2024   Hepatitis C 05/08/2024   Polysubstance abuse (HCC) 05/08/2024   IVDU (intravenous drug user) 05/08/2024   Transaminitis 08/01/2021   Cellulitis of right arm    Abnormal CT scan    Muscle strain    Malnutrition of moderate degree 07/17/2021   Abscess of right arm    Protein-calorie malnutrition, severe 07/11/2021  Tobacco abuse 07/10/2021   Sepsis (HCC) 07/09/2021   Microcytic anemia 07/09/2021   Cellulitis 07/09/2021   Hypokalemia 07/09/2021   Hyponatremia 07/09/2021   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   Opiate dependence (HCC) 03/01/2016   Depressive disorder 02/29/2016   Heroin use disorder, moderate, dependence (HCC) 01/01/2016   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/15/2015   Alcohol use disorder, moderate, dependence (HCC) 09/11/2015   PCP:  Billy Philippe SAUNDERS, NP Pharmacy:   [id number] [place], [place] - [address] AT [facility] [address] [place] [address] Phone: [phone] Fax: [phone]     Social Drivers of Health (SDOH) Social History: SDOH Screenings   Food Insecurity: Food Insecurity Present (05/08/2024)  Housing: Low Risk  (05/08/2024)  Transportation Needs: No Transportation Needs (05/08/2024)  Utilities: Not At Risk (05/08/2024)  Alcohol Screen: Low Risk  (12/06/2023)  Depression (PHQ2-9): Medium Risk (12/06/2023)  Financial Resource Strain: High Risk (12/06/2023)  Physical Activity: Sufficiently Active (12/06/2023)  Social Connections: Socially Isolated (05/08/2024)  Stress: No Stress Concern Present (12/06/2023)  Tobacco Use: High Risk (05/08/2024)  Health Literacy: Adequate Health Literacy (12/06/2023)   SDOH Interventions:     Readmission Risk Interventions     No data to display

## 2024-05-09 NOTE — Progress Notes (Signed)
 [facility] Progress Note Patient: Justin Fisher FMW:[id number] DOB: 02-18-1986  DOA: 05/08/2024 DOS: the patient was seen and examined on 05/09/2024  Brief Hospital Course: Patient with PMH of anxiety, depression, hep C, active IV drug abuse, active smoker presented to the hospital with complaints of swelling of his left arm. Currently been treated for cellulitis and abscess.  Assessment and Plan: Left upper extremity wound with abscess status post IND, olecranon bursectomy and ulnar nerve neurolysis as well as flexor pronator mass tendon tenolysis. Wound culture is growing gram-positive cocci and gram-negative rods. Currently on broad-spectrum antibiotic. Blood cultures currently pending. ID consult for further assistance. Continuing antibiotics per ID.SABRA  Recommendation is for 3 to 4 weeks of IV antibiotics.  Pain control. Chronic IVDU. On methadone .  For now we will continue. Would like to avoid IV narcotics.  Chronic hep C. Outpatient follow-up recommended.  Hyponatremia. Likely from poor p.o. intake in setting of infection. Monitor.  Microcytic anemia. H&H relatively stable. Monitor.  Active smoker. Counseled to quit smoking. Nicotine  gum and nicotine  patch provided.  Mood disorder. On Remeron  30 mg nightly which I will continue. Also on Zoloft .  Which I will continue.  Subjective: Pain well-controlled.  No nausea no vomiting no fever no chills.  Requesting to increase his nicotine  gum dose.  No diarrhea no constipation.  Physical Exam: Clear to auscultation. S1-S2 present Multiple prior surgeries seen on the right upper extremity. Left lower extremity swollen but compartments are still soft. No significant erythema or warmth on left upper extremity. No edema of the lower extremity.  Data Reviewed: I have Reviewed nursing notes, Vitals, and Lab results. Since last encounter, pertinent lab results CBC and BMP   . I have ordered test including CBC  and BMP  . I have discussed pt's care plan and test results with orthopedic and ID  .  Disposition: Status is: Inpatient Remains inpatient appropriate because: Requiring IV antibiotics and culture clearance  enoxaparin (LOVENOX) injection 40 mg Start: 05/08/24 1130   Family Communication: No one at bedside Level of care: Telemetry   Vitals:   05/09/24 0530 05/09/24 0839 05/09/24 0840 05/09/24 1645  BP: 99/60 97/67 99/62  111/63  Pulse: 70 70 68 78  Resp: 16 18 18 18   Temp: (!) 97.4 F (36.3 C) 98.5 F (36.9 C)  98.5 F (36.9 C)  TempSrc: Oral     SpO2: 100% 98% 98% 97%  Weight:      Height:         Author: Yetta Blanch, MD 05/09/2024 6:23 PM  Please look on [email or web address] to find out who is on call.

## 2024-05-09 NOTE — Evaluation (Signed)
 Occupational Therapy Evaluation Patient Details Name: Justin Fisher MRN: [id number] DOB: 1985-12-11 Today's Date: 05/09/2024   History of Present Illness   38 y.o. male presents with an infected arm following heroin injection, with medical history significant of cellulitis, fasciitis, hepatitis C, anxiety, depression, IV drug abuse(heroin, fentanyl , and methamphetamine), tobacco abuse     Clinical Impressions Pt c/o LUE pain/swelling, resting in basin with saline solution. Pt lives with friends who are supportive and able to assist, PLOF independent. Pt currently limited due to LUE swelling, limited with elbow flexion/extension, decreased supination, pain with shoulder ROM, has fair grip and FM skills with hand. Pt independent with balance/mobility, has been able to complete all ADLs with increased time, mod I. Pt would benefit from continued acute OT to maximize functional use of LUE and return to PLOF, instructed on LUE AROM exercises and positioning. Pt may need OPOT upon DC to return to PLOF.      If plan is discharge home, recommend the following:   Assistance with cooking/housework;Assist for transportation     Functional Status Assessment   Patient has had a recent decline in their functional status and demonstrates the ability to make significant improvements in function in a reasonable and predictable amount of time.     Equipment Recommendations   None recommended by OT     Recommendations for Other Services         Precautions/Restrictions   Precautions Precautions: None Recall of Precautions/Restrictions: Intact Restrictions Weight Bearing Restrictions Per Provider Order: No     Mobility Bed Mobility Overal bed mobility: Independent                  Transfers Overall transfer level: Independent Equipment used: None                      Balance Overall balance assessment: Independent                                          ADL either performed or assessed with clinical judgement   ADL Overall ADL's : Needs assistance/impaired                                       General ADL Comments: Pt overall mod I, increased time, able to use RUE for most ADLs, fair hand/finger ROM to affected L hand     Vision Baseline Vision/History: 1 Wears glasses Ability to See in Adequate Light: 0 Adequate Patient Visual Report: No change from baseline       Perception         Praxis         Pertinent Vitals/Pain Pain Assessment Pain Assessment: 0-10 Pain Score: 5  Pain Location: LUE Pain Descriptors / Indicators: Aching, Constant, Discomfort Pain Intervention(s): Monitored during session     Extremity/Trunk Assessment Upper Extremity Assessment Upper Extremity Assessment: LUE deficits/detail LUE Deficits / Details: LUE infection due to IV injection, significant pain, limited elbow/shoulder ROM, limited supination, fair hand/finger function. LUE Sensation: WNL LUE Coordination: decreased gross motor   Lower Extremity Assessment Lower Extremity Assessment: Defer to PT evaluation       Communication Communication Communication: No apparent difficulties   Cognition Arousal: Alert Behavior During Therapy: WFL for tasks assessed/performed Cognition: No apparent impairments  Following commands: Intact       Cueing  General Comments   Cueing Techniques: Verbal cues  Redness/swelling to infected LUE   Exercises     Shoulder Instructions      Home Living Family/patient expects to be discharged to:: Private residence Living Arrangements: Other relatives Available Help at Discharge: Friend(s);Available PRN/intermittently Type of Home: House Home Access: Stairs to enter [employer] of Steps: 2                       Additional Comments: Pt lives with friends who are able to assist      Prior  Functioning/Environment Prior Level of Function : Independent/Modified Independent                    OT Problem List: Decreased strength;Decreased range of motion;Decreased activity tolerance;Impaired UE functional use;Pain;Increased edema   OT Treatment/Interventions: Self-care/ADL training;Therapeutic exercise;Manual therapy;Therapeutic activities;Patient/family education      OT Goals(Current goals can be found in the care plan section)   Acute Rehab OT Goals Patient Stated Goal: to manage pain OT Goal Formulation: With patient Time For Goal Achievement: 05/23/24 Potential to Achieve Goals: Good   OT Frequency:  Min 2X/week    Co-evaluation              AM-PAC OT 6 Clicks Daily Activity     Outcome Measure Help from another person eating meals?: None Help from another person taking care of personal grooming?: None Help from another person toileting, which includes using toliet, bedpan, or urinal?: None Help from another person bathing (including washing, rinsing, drying)?: None Help from another person to put on and taking off regular upper body clothing?: None Help from another person to put on and taking off regular lower body clothing?: None 6 Click Score: 24   End of Session Nurse Communication: Mobility status  Activity Tolerance: Patient tolerated treatment well Patient left: in bed;with call bell/phone within reach  OT Visit Diagnosis: Muscle weakness (generalized) (M62.81);Pain Pain - Right/Left: Left Pain - part of body: Arm;Hand                Time: 1040-1058 OT Time Calculation (min): 18 min Charges:  OT General Charges $OT Visit: 1 Visit OT Evaluation $OT Eval Low Complexity: 1 Low  [address] OTR/L   Elouise JONELLE Bott 05/09/2024, 11:16 AM

## 2024-05-09 NOTE — TOC CAGE-AID Note (Addendum)
 Transition of Care [facility]) - CAGE-AID Screening   Patient Details  Name: Justin Fisher MRN: [id number] Date of Birth: 12/02/1985  [facility] of [facility]) CM/SW Contact:    Lendia Dais, LCSWA Phone Number: 05/09/2024, 11:10 AM   Clinical Narrative: CSW informed pt of the consult for a CAGE AID assessment and explained the purpose. CSW asked for permission to perform the assessment and the pt was agreeable.   Pt stated that since starting treatment at [facility], they will use substance 2-3 times a month. Pt receives transportation [facility] called [facility] MWF to appointments [facility].   CSW explained since they answer 2 or more questions with a yes resources would be offered. Pt declined and stated they have tried narcotics anonymous and did not feel support but have a support group outside of that. Pt states they already have a list of resources to refer to.     CAGE-AID Screening:    Have You Ever Felt You Ought to Cut Down on Your Drinking or Drug Use?: Yes Have People Annoyed You By Critizing Your Drinking Or Drug Use?: Yes Have You Felt Bad Or Guilty About Your Drinking Or Drug Use?: Yes Have You Ever Had a Drink or Used Drugs First Thing In The Morning to Steady Your Nerves or to Get Rid of a Hangover?: Yes CAGE-AID Score: 4  Substance Abuse Education Offered: Yes  Substance abuse interventions: Transport Planner, SDOH Screening

## 2024-05-09 NOTE — Consult Note (Signed)
 [facility]  Total days of antibiotics 2/piptazo/vanco       Reason for Consult: left arm abscess in setting of IVDU   Referring Physician: patel  Principal Problem:   Cellulitis and abscess of upper extremity Active Problems:   Microcytic anemia   Hyponatremia   Leukocytosis   Hepatitis C   Polysubstance abuse (HCC)   IVDU (intravenous drug user)    HPI: Justin Fisher is a 38 y.o. male with hx of active IVDU (heroin and fentanyl  and meth) on methadone  plus chronic hepatitis C who was injecting to his left [facility] and started to notice increasing swelling, erythema, and warmth to his lower aspect of arm, below elbow. He started to notice some drainage at injection site over the weekend and thus came to the ED to be evaluated. He had similar episode several years ago to right upper arm requiring debridement and skin graft- now has noticeable defect to right deltoid. He has never been treated for endocarditis per his report. In the ED, he was afebrile, found to have leukocytosis with WBC 20K. CT showed abscess. He was started on vanco and piptazo. He underwent IX D as well as bursectomy of olecranon bursa. Gram stain showing GPC and GNR.     Past Medical History:  Diagnosis Date   Alcohol abuse    Anxiety    Depression    Drug abuse (HCC)    Suicidal thoughts     Allergies: No Known Allergies  MEDICATIONS:  chlorhexidine    Topical Q8H   enoxaparin (LOVENOX) injection  40 mg Subcutaneous Q24H   gabapentin   400 mg Oral TID   methadone   180 mg Oral Daily   mirtazapine   30 mg Oral QHS   nicotine   14 mg Transdermal Daily   sertraline   150 mg Oral Daily   sodium chloride  flush  3 mL Intravenous Q12H    Social History   Tobacco Use   Smoking status: Every Day    Current packs/day: 0.50    Types: Cigarettes   Smokeless tobacco: Never  Vaping Use   Vaping status: Every Day  Substance Use Topics   Alcohol use: Not Currently   Drug use: Yes     Types: IV    Comment: 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)    Family History  Problem Relation Age of Onset   Cancer Mother        Lung   Early death Mother    Diabetes Father    Depression Father    Diabetes Maternal Grandmother    Diabetes Maternal Grandfather    Diabetes Paternal Grandmother    Diabetes Paternal Grandfather     Review of Systems  Constitutional: Negative for fever, chills, diaphoresis, activity change, appetite change, fatigue and unexpected weight change.  HENT: Negative for congestion, sore throat, rhinorrhea, sneezing, trouble swallowing and sinus pressure.  Eyes: Negative for photophobia and visual disturbance.  Respiratory: Negative for cough, chest tightness, shortness of breath, wheezing and stridor.  Cardiovascular: Negative for chest pain, palpitations and leg swelling.  Gastrointestinal: Negative for nausea, vomiting, abdominal pain, diarrhea, constipation, blood in stool, abdominal distention and anal bleeding.  Genitourinary: Negative for dysuria, hematuria, flank pain and difficulty urinating.  Musculoskeletal: right arm swelling and redness Neurological: Negative for dizziness, tremors, weakness and light-headedness.  Hematological: Negative for adenopathy. Does not bruise/bleed easily.  Psychiatric/Behavioral: Negative for behavioral problems, confusion, sleep disturbance, dysphoric mood, decreased concentration and  agitation.    OBJECTIVE: Temp:  [97.4 F (36.3 C)-98.6 F (37 C)] 98.5 F (36.9 C) (11/04 1645) Pulse Rate:  [68-83] 78 (11/04 1645) Resp:  [11-18] 18 (11/04 1645) BP: (95-120)/(58-84) 111/63 (11/04 1645) SpO2:  [93 %-100 %] 97 % (11/04 1645) Physical Exam  Constitutional: He is oriented to person, place, and time. He appears well-developed and well-nourished. No distress.  HENT:  Mouth/Throat: Oropharynx is clear and moist. No oropharyngeal exudate.  Cardiovascular: Normal rate, regular rhythm and  normal heart sounds. Exam reveals no gallop and no friction rub.  No murmur heard.  Pulmonary/Chest: Effort normal and breath sounds normal. No respiratory distress. He has no wheezes.  Abdominal: Soft. Bowel sounds are normal. He exhibits no distension. Protuberant. Zku:ozqu arm wrapped Neurological: He is alert and oriented to person, place, and time.  Skin: Skin is warm and dry. No rash noted. No erythema.  Psychiatric: He has a normal mood and affect. His behavior is normal.    LABS: Results for orders placed or performed during the hospital encounter of 05/08/24 (from the past 48 hours)  Comprehensive metabolic panel     Status: Abnormal   Collection Time: 05/08/24  7:31 AM  Result Value Ref Range   Sodium 129 (L) 135 - 145 mmol/L   Potassium 3.7 3.5 - 5.1 mmol/L   Chloride 97 (L) 98 - 111 mmol/L   CO2 21 (L) 22 - 32 mmol/L   Glucose, Bld 143 (H) 70 - 99 mg/dL    Comment: Glucose reference range applies only to samples taken after fasting for at least 8 hours.   BUN 7 6 - 20 mg/dL   Creatinine, Ser 9.42 (L) 0.61 - 1.24 mg/dL   Calcium 8.5 (L) 8.9 - 10.3 mg/dL   Total Protein 7.9 6.5 - 8.1 g/dL   Albumin 3.0 (L) 3.5 - 5.0 g/dL   AST 29 15 - 41 U/L   ALT 25 0 - 44 U/L   Alkaline Phosphatase 81 38 - 126 U/L   Total Bilirubin 0.5 0.0 - 1.2 mg/dL   GFR, Estimated >39 >39 mL/min    Comment: (NOTE) Calculated using the CKD-EPI Creatinine Equation (2021)    Anion gap 11 5 - 15    Comment: Performed at [facility], [address], [place], [place] [address]  CBC with Differential     Status: Abnormal   Collection Time: 05/08/24  7:31 AM  Result Value Ref Range   WBC 20.4 (H) 4.0 - 10.5 K/uL   RBC 4.55 4.22 - 5.81 MIL/uL   Hemoglobin 10.1 (L) 13.0 - 17.0 g/dL   HCT 66.9 (L) 60.9 - 47.9 %   MCV 72.5 (L) 80.0 - 100.0 fL   MCH 22.2 (L) 26.0 - 34.0 pg   MCHC 30.6 30.0 - 36.0 g/dL   RDW 81.6 (H) 88.4 - 84.4 %   Platelets 524 (H) 150 - 400 K/uL   nRBC 0.0 0.0 - 0.2 %    Neutrophils Relative % 68 %   Neutro Abs 14.0 (H) 1.7 - 7.7 K/uL   Lymphocytes Relative 20 %   Lymphs Abs 4.2 (H) 0.7 - 4.0 K/uL   Monocytes Relative 9 %   Monocytes Absolute 1.8 (H) 0.1 - 1.0 K/uL   Eosinophils Relative 1 %   Eosinophils Absolute 0.2 0.0 - 0.5 K/uL   Basophils Relative 1 %   Basophils Absolute 0.1 0.0 - 0.1 K/uL   Immature Granulocytes 1 %   Abs Immature Granulocytes  0.16 (H) 0.00 - 0.07 K/uL    Comment: Performed at [facility], [address] [place], [place] [address]  Protime-INR     Status: None   Collection Time: 05/08/24  7:31 AM  Result Value Ref Range   Prothrombin Time 14.0 11.4 - 15.2 seconds   INR 1.0 0.8 - 1.2    Comment: (NOTE) INR goal varies based on device and disease states. Performed at [facility], [address], [place], [place] [address]   Culture, blood (Routine x 2)     Status: None (Preliminary result)   Collection Time: 05/08/24  7:33 AM   Specimen: BLOOD  Result Value Ref Range   Specimen Description BLOOD    Special Requests      BOTTLES DRAWN AEROBIC AND ANAEROBIC Blood Culture adequate volume   Culture      NO GROWTH < 24 HOURS Performed at [facility], [address] [place], [place] [address]    Report Status PENDING   I-Stat Lactic Acid, ED     Status: None   Collection Time: 05/08/24  7:45 AM  Result Value Ref Range   Lactic Acid, Venous 0.7 0.5 - 1.9 mmol/L  Culture, blood (Routine x 2)     Status: None (Preliminary result)   Collection Time: 05/08/24  8:03 AM   Specimen: BLOOD RIGHT ARM  Result Value Ref Range   Specimen Description BLOOD RIGHT ARM    Special Requests      BOTTLES DRAWN AEROBIC AND ANAEROBIC Blood Culture adequate volume   Culture      NO GROWTH < 24 HOURS Performed at [facility], [address] [place], [place] [address]    Report Status PENDING   C-reactive protein     Status: Abnormal   Collection Time: 05/08/24 10:58 AM  Result Value Ref Range   CRP 16.7  (H) <1.0 mg/dL    Comment: Performed at [facility], [address] [place], [place] [address]  Aerobic/Anaerobic Culture w Gram Stain (surgical/deep wound)     Status: None (Preliminary result)   Collection Time: 05/08/24  6:38 PM   Specimen: Wound  Result Value Ref Range   Specimen Description WOUND LEFT ARM    Special Requests NONE    Gram Stain      FEW WBC PRESENT,BOTH PMN AND MONONUCLEAR MODERATE GRAM POSITIVE COCCI FEW GRAM NEGATIVE RODS    Culture      NO GROWTH < 12 HOURS Performed at [facility], [address] [place], [place] [address]    Report Status PENDING   CBC     Status: Abnormal   Collection Time: 05/09/24  5:13 AM  Result Value Ref Range   WBC 13.2 (H) 4.0 - 10.5 K/uL   RBC 3.88 (L) 4.22 - 5.81 MIL/uL   Hemoglobin 8.7 (L) 13.0 - 17.0 g/dL    Comment: Reticulocyte Hemoglobin testing may be clinically indicated, consider ordering this additional test [id number]    HCT 28.6 (L) 39.0 - 52.0 %   MCV 73.7 (L) 80.0 - 100.0 fL   MCH 22.4 (L) 26.0 - 34.0 pg   MCHC 30.4 30.0 - 36.0 g/dL   RDW 81.5 (H) 88.4 - 84.4 %   Platelets 451 (H) 150 - 400 K/uL   nRBC 0.0 0.0 - 0.2 %    Comment: Performed at [facility], [address] [place], [place] [address]  Comprehensive metabolic panel  Status: Abnormal   Collection Time: 05/09/24  5:13 AM  Result Value Ref Range   Sodium 137 135 - 145 mmol/L   Potassium 3.9 3.5 - 5.1 mmol/L   Chloride 104 98 - 111 mmol/L   CO2 24 22 - 32 mmol/L   Glucose, Bld 135 (H) 70 - 99 mg/dL    Comment: Glucose reference range applies only to samples taken after fasting for at least 8 hours.   BUN 7 6 - 20 mg/dL   Creatinine, Ser 9.38 0.61 - 1.24 mg/dL   Calcium 8.1 (L) 8.9 - 10.3 mg/dL   Total Protein 6.7 6.5 - 8.1 g/dL   Albumin 2.4 (L) 3.5 - 5.0 g/dL   AST 25 15 - 41 U/L   ALT 20 0 - 44 U/L   Alkaline Phosphatase 80 38 - 126 U/L   Total Bilirubin 0.3 0.0 - 1.2 mg/dL   GFR, Estimated >39 >39 mL/min     Comment: (NOTE) Calculated using the CKD-EPI Creatinine Equation (2021)    Anion gap 9 5 - 15    Comment: Performed at [facility], [address] [place], [place] [address]  HIV Antibody (routine testing w rflx)     Status: None   Collection Time: 05/09/24  5:13 AM  Result Value Ref Range   HIV Screen 4th Generation wRfx Non Reactive Non Reactive    Comment: Performed at [facility], [address] [place], [place] [address]    MICRO: reviewed IMAGING: No results found.  Assessment/Plan:  37yoM with hx of IVDU, chronic hep c admitted for injection related abscess to left arm S/P I x D. S/p bursectomy - polymicrobial abscess = continue on piptazo. Anticipate to need treatment for 3-4 wk. Possibly can do oral abtx. Await sensitivities  Leukocytosis/thrombocytosis = improved with source control  Long term medication management= will check vancomycin  level  Chronic hep C = check genotype and viral load  Opiate dependence =defer to primary team for management then resume methadone   evaluation of this patient requires complex antimicrobial therapy evaluation and counseling and isolation needs for disease transmission risk assessment and mitigation.

## 2024-05-09 NOTE — Progress Notes (Signed)
 POD #1 Left arm I&D for infection 2/2 IV drug use  -Patient examined at bedside, resting comfortably - No systemic symptoms - Pain improving - Dressing removed, no areas of fluctuance/induration, swelling noted, scant blood, no purulent drainage, able to flex/extend the elbow, full wrist and finger range of motion, sensation intact to light touch, fingers warm and well perfused  - RN at bedside assisted with dressing removal and started soapy soaks, will repack and redress wound - Soapy soaks every 8 hours, then repack and redress with nonadherent, gauze/light wrap, ace bandage entire arm - Continue elevation, finger range of motion, WBAT - Antibiotics per ID  Follow up in the office 5-7 days after discharge

## 2024-05-09 NOTE — Plan of Care (Signed)
  Problem: Education: Goal: Knowledge of General Education information will improve Description: Including pain rating scale, medication(s)/side effects and non-pharmacologic comfort measures Outcome: Progressing   Problem: Health Behavior/Discharge Planning: Goal: Ability to manage health-related needs will improve Outcome: Progressing   Problem: Clinical Measurements: Goal: Ability to maintain clinical measurements within normal limits will improve Outcome: Progressing Goal: Will remain free from infection Outcome: Progressing Goal: Diagnostic test results will improve Outcome: Progressing Goal: Respiratory complications will improve Outcome: Progressing Goal: Cardiovascular complication will be avoided Outcome: Progressing   Problem: Activity: Goal: Risk for activity intolerance will decrease Outcome: Progressing   Problem: Nutrition: Goal: Adequate nutrition will be maintained Outcome: Progressing   Problem: Coping: Goal: Level of anxiety will decrease Outcome: Progressing   Participating in dressing change and aware he will need when he goes home.

## 2024-05-10 DIAGNOSIS — L03119 Cellulitis of unspecified part of limb: Secondary | ICD-10-CM | POA: Diagnosis not present

## 2024-05-10 DIAGNOSIS — L02419 Cutaneous abscess of limb, unspecified: Secondary | ICD-10-CM | POA: Diagnosis not present

## 2024-05-10 LAB — CBC WITH DIFFERENTIAL/PLATELET
Abs Immature Granulocytes: 0.15 K/uL — ABNORMAL HIGH (ref 0.00–0.07)
Basophils Absolute: 0.1 K/uL (ref 0.0–0.1)
Basophils Relative: 1 %
Eosinophils Absolute: 0.5 K/uL (ref 0.0–0.5)
Eosinophils Relative: 4 %
HCT: 35.2 % — ABNORMAL LOW (ref 39.0–52.0)
Hemoglobin: 10.3 g/dL — ABNORMAL LOW (ref 13.0–17.0)
Immature Granulocytes: 1 %
Lymphocytes Relative: 44 %
Lymphs Abs: 5.3 K/uL — ABNORMAL HIGH (ref 0.7–4.0)
MCH: 22.3 pg — ABNORMAL LOW (ref 26.0–34.0)
MCHC: 29.3 g/dL — ABNORMAL LOW (ref 30.0–36.0)
MCV: 76.2 fL — ABNORMAL LOW (ref 80.0–100.0)
Monocytes Absolute: 0.7 K/uL (ref 0.1–1.0)
Monocytes Relative: 6 %
Neutro Abs: 5.4 K/uL (ref 1.7–7.7)
Neutrophils Relative %: 44 %
Platelets: 626 K/uL — ABNORMAL HIGH (ref 150–400)
RBC: 4.62 MIL/uL (ref 4.22–5.81)
RDW: 19 % — ABNORMAL HIGH (ref 11.5–15.5)
WBC: 12.1 K/uL — ABNORMAL HIGH (ref 4.0–10.5)
nRBC: 0 % (ref 0.0–0.2)

## 2024-05-10 LAB — BASIC METABOLIC PANEL WITH GFR
Anion gap: 13 (ref 5–15)
BUN: 9 mg/dL (ref 6–20)
CO2: 23 mmol/L (ref 22–32)
Calcium: 8.3 mg/dL — ABNORMAL LOW (ref 8.9–10.3)
Chloride: 104 mmol/L (ref 98–111)
Creatinine, Ser: 0.65 mg/dL (ref 0.61–1.24)
GFR, Estimated: >60 mL/min (ref >60)
Glucose, Bld: 90 mg/dL (ref 70–99)
Potassium: 4 mmol/L (ref 3.5–5.1)
Sodium: 140 mmol/L (ref 135–145)

## 2024-05-10 MED ORDER — SENNOSIDES-DOCUSATE SODIUM 8.6-50 MG PO TABS
2.0000 | ORAL_TABLET | Freq: Two times a day (BID) | ORAL | Status: DC
  Administered 2024-05-10 – 2024-05-14 (×6): 2 via ORAL
  Filled 2024-05-10 (×8): qty 2

## 2024-05-10 MED ORDER — METHADONE HCL 10 MG PO TABS
180.0000 mg | ORAL_TABLET | Freq: Every day | ORAL | Status: DC
  Administered 2024-05-10 – 2024-05-14 (×5): 180 mg via ORAL
  Filled 2024-05-10 (×5): qty 18

## 2024-05-10 NOTE — Progress Notes (Signed)
 OT Cancellation Note  Patient Details Name: Justin Fisher MRN: [id number] DOB: 06-10-1986   Cancelled Treatment:    Reason Eval/Treat Not Completed: Other (comment) (Pt reported that they no longer want Occupational Therapy. He reported I already talked with that other guy already. He was advised about seeking OP CHT to ensure full ROM of LUE. Pt voiced an understanding. Spoke to pt about speaking to Md if need re consult at any time. Thank you.   Warrick POUR OTR/L  Acute Rehab Services  [phone] office number   [name] 05/10/2024, 10:38 AM

## 2024-05-10 NOTE — Progress Notes (Signed)
 PROGRESS NOTE  Justin Fisher FMW:[id number] DOB: 1985-10-13 DOA: 05/08/2024 PCP: Billy Philippe SAUNDERS, NP   LOS: 2 days   Brief Narrative / Interim history: Patient with PMH of anxiety, depression, hep C, active IV drug abuse, active smoker presented to the hospital with complaints of swelling of his left arm. Currently been treated for cellulitis and abscess.  Subjective / 24h Interval events: He is doing well this morning, complains of constipation, no fever or chills, significant pain  Assesement and Plan: Principal problem Left upper extremity wound with abscess status post IND, olecranon bursectomy and ulnar nerve neurolysis as well as flexor pronator mass tendon tenolysis -orthopedic surgery consulted, he was taken to the OR on 11/3 status post I&D.  Wound culture is growing gram-positive cocci and gram-negative rods. Currently on broad-spectrum antibiotic. -Blood cultures are negative so far.  ID following  Active problems Chronic IVDU - On methadone , continue  Chronic hep C - Outpatient follow-up recommended.   Hyponatremia - Likely from poor p.o. intake in setting of infection.  Sodium normalized   Microcytic anemia - H&H relatively stable. Monitor.   Active smoker - Counseled to quit smoking. Nicotine  gum and nicotine  patch provided.   Mood disorder -continue medications as below  Scheduled Meds:  chlorhexidine    Topical Q8H   enoxaparin (LOVENOX) injection  40 mg Subcutaneous Q24H   gabapentin   400 mg Oral TID   methadone   180 mg Oral Daily   mirtazapine   30 mg Oral QHS   nicotine   14 mg Transdermal Daily   senna-docusate  2 tablet Oral BID   sertraline   150 mg Oral Daily   sodium chloride  flush  3 mL Intravenous Q12H   Continuous Infusions:  piperacillin -tazobactam (ZOSYN )  IV 3.375 g (05/10/24 0608)   vancomycin  1,250 mg (05/10/24 0928)   PRN Meds:.hydrOXYzine , nicotine  polacrilex, ondansetron  **OR** ondansetron  (ZOFRAN ) IV, oxyCODONE   Current  Outpatient Medications  Medication Instructions   acetaminophen  (TYLENOL ) 650 mg, Oral, Every 6 hours PRN   cloNIDine  (CATAPRES ) 0.1 mg, Oral, 3 times daily PRN   COLACE 2-IN-1 8.6-50 MG tablet 1 tablet, Daily   gabapentin  (NEURONTIN ) 400 mg, Oral, 3 times daily, For agitation   hydrOXYzine  (ATARAX ) 50 mg, Oral, Every 6 hours PRN   ibuprofen  (ADVIL ) 600 mg, Oral, Daily PRN   methadone  (DOLOPHINE ) 180 mg, Daily   mirtazapine  (REMERON ) 30 mg, Oral, Daily at bedtime   sertraline  (ZOLOFT ) 200 mg, Oral, Daily    Diet Orders (From admission, onward)     Start     Ordered   05/08/24 1932  Diet regular Room service appropriate? Yes; Fluid consistency: Thin  Diet effective now       Question Answer Comment  Room service appropriate? Yes   Fluid consistency: Thin      05/08/24 1931            DVT prophylaxis: enoxaparin (LOVENOX) injection 40 mg Start: 05/08/24 1130   Lab Results  Component Value Date   PLT 626 (H) 05/10/2024      Code Status: Full Code  Family Communication: No family at bedside  Status is: Inpatient Remains inpatient appropriate because: Severity of illness  Level of care: Telemetry  Consultants:  ID Orthopedic surgery  Objective: Vitals:   05/09/24 1645 05/09/24 2100 05/10/24 0413 05/10/24 0828  BP: 111/63 119/69 108/67 100/75  Pulse: 78 78 65 64  Resp: 18  18 19   Temp: 98.5 F (36.9 C) 98.7 F (37.1 C) (!) 97.5 F (36.4 C) (!)  97.5 F (36.4 C)  TempSrc:  Oral    SpO2: 97% 99% 98% 100%  Weight:      Height:        Intake/Output Summary (Last 24 hours) at 05/10/2024 1020 Last data filed at 05/10/2024 9171 Gross per 24 hour  Intake 2390 ml  Output 0 ml  Net 2390 ml   Wt Readings from Last 3 Encounters:  05/08/24 77.1 kg  12/06/23 65.8 kg  07/29/21 72 kg    Examination:  Constitutional: NAD Eyes: no scleral icterus ENMT: Mucous membranes are moist.  Neck: normal, supple Respiratory: clear to auscultation bilaterally, no  wheezing, no crackles.  Cardiovascular: Regular rate and rhythm, no murmurs / rubs / gallops.   Abdomen: non distended, no tenderness. Bowel sounds positive.  Musculoskeletal: no clubbing / cyanosis.    Data Reviewed: I have independently reviewed following labs and imaging studies   CBC Recent Labs  Lab 05/08/24 0731 05/09/24 0513 05/10/24 0354  WBC 20.4* 13.2* 12.1*  HGB 10.1* 8.7* 10.3*  HCT 33.0* 28.6* 35.2*  PLT 524* 451* 626*  MCV 72.5* 73.7* 76.2*  MCH 22.2* 22.4* 22.3*  MCHC 30.6 30.4 29.3*  RDW 18.3* 18.4* 19.0*  LYMPHSABS 4.2*  --  5.3*  MONOABS 1.8*  --  0.7  EOSABS 0.2  --  0.5  BASOSABS 0.1  --  0.1    Recent Labs  Lab 05/08/24 0731 05/08/24 0745 05/08/24 1058 05/09/24 0513 05/10/24 0354  NA 129*  --   --  137 140  K 3.7  --   --  3.9 4.0  CL 97*  --   --  104 104  CO2 21*  --   --  24 23  GLUCOSE 143*  --   --  135* 90  BUN 7  --   --  7 9  CREATININE 0.57*  --   --  0.61 0.65  CALCIUM 8.5*  --   --  8.1* 8.3*  AST 29  --   --  25  --   ALT 25  --   --  20  --   ALKPHOS 81  --   --  80  --   BILITOT 0.5  --   --  0.3  --   ALBUMIN 3.0*  --   --  2.4*  --   CRP  --   --  16.7*  --   --   LATICACIDVEN  --  0.7  --   --   --   INR 1.0  --   --   --   --     ------------------------------------------------------------------------------------------------------------------ No results for input(s): CHOL, HDL, LDLCALC, TRIG, CHOLHDL, LDLDIRECT in the last 72 hours.  Lab Results  Component Value Date   HGBA1C 5.3 07/10/2021   ------------------------------------------------------------------------------------------------------------------ No results for input(s): TSH, T4TOTAL, T3FREE, THYROIDAB in the last 72 hours.  Invalid input(s): FREET3  Cardiac Enzymes No results for input(s): CKMB, TROPONINI, MYOGLOBIN in the last 168 hours.  Invalid input(s):  CK ------------------------------------------------------------------------------------------------------------------ No results found for: BNP  CBG: No results for input(s): GLUCAP in the last 168 hours.  Recent Results (from the past 240 hours)  Culture, blood (Routine x 2)     Status: None (Preliminary result)   Collection Time: 05/08/24  7:33 AM   Specimen: BLOOD  Result Value Ref Range Status   Specimen Description BLOOD  Final   Special Requests   Final    BOTTLES DRAWN AEROBIC AND ANAEROBIC  Blood Culture adequate volume   Culture   Final    NO GROWTH 2 DAYS Performed at [facility], [address], [place], [place] [address]    Report Status PENDING  Incomplete  Culture, blood (Routine x 2)     Status: None (Preliminary result)   Collection Time: 05/08/24  8:03 AM   Specimen: BLOOD RIGHT ARM  Result Value Ref Range Status   Specimen Description BLOOD RIGHT ARM  Final   Special Requests   Final    BOTTLES DRAWN AEROBIC AND ANAEROBIC Blood Culture adequate volume   Culture   Final    NO GROWTH 2 DAYS Performed at [facility], [address], [place], [place] [address]    Report Status PENDING  Incomplete  Aerobic/Anaerobic Culture w Gram Stain (surgical/deep wound)     Status: None (Preliminary result)   Collection Time: 05/08/24  6:38 PM   Specimen: Wound  Result Value Ref Range Status   Specimen Description WOUND LEFT ARM  Final   Special Requests NONE  Final   Gram Stain   Final    FEW WBC PRESENT,BOTH PMN AND MONONUCLEAR MODERATE GRAM POSITIVE COCCI FEW GRAM NEGATIVE RODS    Culture   Final    CULTURE REINCUBATED FOR BETTER GROWTH Performed at [facility], [address] [place], [place] [address]    Report Status PENDING  Incomplete     Radiology Studies: No results found.   Nilda Fendt, MD, PhD [facility]  Between 7 am - 7 pm I am available, please contact me via Amion (for emergencies) or Securechat (non  urgent messages)  Between 7 pm - 7 am I am not available, please contact night coverage MD/APP via Amion

## 2024-05-10 NOTE — Progress Notes (Signed)
 [facility]  Date of Admission:  05/08/2024      Total days of antibiotics 3  Vanc  Zosyn           ASSESSMENT: Justin Fisher is a 38 y.o. male admitted with:   Polymicrobial Abscess Left Forearm -  Soft tissue tendon involvement. Still pending cultures to see what options we have for PO treatment to offer.  - Continue current IV Vanc / Zosyn .  - FU cultures   Chronic Hepatitis C Infection - RNA of 11.2 million copies in June of this year. Does not seem that he has been connected for treatment. We can help with this outpatient.  - FU genotype & repeat VL  - repeat Hep b surface quant (titer was only about 10 in 2023)   Substance Use Disorder -  Ongoing use with opiate dependence. On methadone     PLAN: - Continue current IV Vanc / Zosyn .  - FU cultures  - FU genotype & repeat VL  - repeat Hep b surface quant   Principal Problem:   Cellulitis and abscess of upper extremity Active Problems:   Microcytic anemia   Hyponatremia   Leukocytosis   Hepatitis C   Polysubstance abuse (HCC)   IVDU (intravenous drug user)    chlorhexidine    Topical Q8H   enoxaparin (LOVENOX) injection  40 mg Subcutaneous Q24H   gabapentin   400 mg Oral TID   methadone   180 mg Oral Daily   mirtazapine   30 mg Oral QHS   nicotine   14 mg Transdermal Daily   sertraline   150 mg Oral Daily   sodium chloride  flush  3 mL Intravenous Q12H    SUBJECTIVE: Hand is swollen but better. Still feels like he has fat knuckles.  Tolerating antibiotics well.    Review of Systems: Review of Systems  Constitutional:  Negative for chills and fever.  HENT:  Negative for tinnitus.   Eyes:  Negative for blurred vision and photophobia.  Respiratory:  Negative for cough and sputum production.   Cardiovascular:  Negative for chest pain.  Gastrointestinal:  Negative for diarrhea, nausea and vomiting.  Genitourinary:  Negative for dysuria.  Musculoskeletal:   Positive for joint pain.  Skin:  Negative for rash.  Neurological:  Negative for headaches.     No Known Allergies  OBJECTIVE: Vitals:   05/09/24 1645 05/09/24 2100 05/10/24 0413 05/10/24 0828  BP: 111/63 119/69 108/67 100/75  Pulse: 78 78 65 64  Resp: 18  18 19   Temp: 98.5 F (36.9 C) 98.7 F (37.1 C) (!) 97.5 F (36.4 C) (!) 97.5 F (36.4 C)  TempSrc:  Oral    SpO2: 97% 99% 98% 100%  Weight:      Height:       Body mass index is 26.63 kg/m.  Physical Exam Constitutional:      Appearance: Normal appearance.  Cardiovascular:     Rate and Rhythm: Normal rate.  Musculoskeletal:     Left hand: Swelling present.     Comments: Clean wrapped dressing   Skin:    General: Skin is warm and dry.     Capillary Refill: Capillary refill takes less than 2 seconds.     Findings: No rash.  Neurological:     Mental Status: He is alert.     Lab Results Lab Results  Component Value Date   WBC 12.1 (H) 05/10/2024   HGB 10.3 (L) 05/10/2024   HCT  35.2 (L) 05/10/2024   MCV 76.2 (L) 05/10/2024   PLT 626 (H) 05/10/2024    Lab Results  Component Value Date   CREATININE 0.65 05/10/2024   BUN 9 05/10/2024   NA 140 05/10/2024   K 4.0 05/10/2024   CL 104 05/10/2024   CO2 23 05/10/2024    Lab Results  Component Value Date   ALT 20 05/09/2024   AST 25 05/09/2024   ALKPHOS 80 05/09/2024   BILITOT 0.3 05/09/2024     Microbiology: Recent Results (from the past 240 hours)  Culture, blood (Routine x 2)     Status: None (Preliminary result)   Collection Time: 05/08/24  7:33 AM   Specimen: BLOOD  Result Value Ref Range Status   Specimen Description BLOOD  Final   Special Requests   Final    BOTTLES DRAWN AEROBIC AND ANAEROBIC Blood Culture adequate volume   Culture   Final    NO GROWTH 2 DAYS Performed at [facility], [address] [place], [place] [address]    Report Status PENDING  Incomplete  Culture, blood (Routine x 2)     Status: None (Preliminary result)    Collection Time: 05/08/24  8:03 AM   Specimen: BLOOD RIGHT ARM  Result Value Ref Range Status   Specimen Description BLOOD RIGHT ARM  Final   Special Requests   Final    BOTTLES DRAWN AEROBIC AND ANAEROBIC Blood Culture adequate volume   Culture   Final    NO GROWTH 2 DAYS Performed at [facility], [address], [place], [place] [address]    Report Status PENDING  Incomplete  Aerobic/Anaerobic Culture w Gram Stain (surgical/deep wound)     Status: None (Preliminary result)   Collection Time: 05/08/24  6:38 PM   Specimen: Wound  Result Value Ref Range Status   Specimen Description WOUND LEFT ARM  Final   Special Requests NONE  Final   Gram Stain   Final    FEW WBC PRESENT,BOTH PMN AND MONONUCLEAR MODERATE GRAM POSITIVE COCCI FEW GRAM NEGATIVE RODS    Culture   Final    CULTURE REINCUBATED FOR BETTER GROWTH Performed at [facility], [address] [place], [place] [address]    Report Status PENDING  Incomplete    Corean Fireman, MSN, NP-C [facility]  [email or web address] Pager: [phone] Office: [phone] [facility] Main Line: [phone] *Secure Chat Communication Welcome  Total Encounter Time: 10

## 2024-05-11 DIAGNOSIS — L03119 Cellulitis of unspecified part of limb: Secondary | ICD-10-CM | POA: Diagnosis not present

## 2024-05-11 DIAGNOSIS — L02419 Cutaneous abscess of limb, unspecified: Secondary | ICD-10-CM | POA: Diagnosis not present

## 2024-05-11 LAB — HEPATITIS B SURFACE ANTIGEN: Hepatitis B Surface Ag: NONREACTIVE

## 2024-05-11 MED ORDER — GABAPENTIN 300 MG PO CAPS
600.0000 mg | ORAL_CAPSULE | Freq: Three times a day (TID) | ORAL | Status: DC
  Administered 2024-05-11 – 2024-05-14 (×10): 600 mg via ORAL
  Filled 2024-05-11 (×10): qty 2

## 2024-05-11 MED ORDER — SODIUM CHLORIDE 0.9 % IV SOLN
2.0000 g | INTRAVENOUS | Status: DC
  Administered 2024-05-11 – 2024-05-12 (×2): 2 g via INTRAVENOUS
  Filled 2024-05-11 (×2): qty 20

## 2024-05-11 NOTE — Progress Notes (Signed)
 PROGRESS NOTE  Justin Fisher FMW:[id number] DOB: Sep 03, 1985 DOA: 05/08/2024 PCP: Billy Philippe SAUNDERS, NP   LOS: 3 days   Brief Narrative / Interim history: Patient with PMH of anxiety, depression, hep C, active IV drug abuse, active smoker presented to the hospital with complaints of swelling of his left arm. Currently been treated for cellulitis and abscess.  Subjective / 24h Interval events: No chest pain, no fever or chills.  Asking me to increase his gabapentin  dose as at home he takes 600 mg 3 times daily  Assesement and Plan: Principal problem Left upper extremity wound with abscess status post IND, olecranon bursectomy and ulnar nerve neurolysis as well as flexor pronator mass tendon tenolysis -orthopedic surgery consulted, he was taken to the OR on 11/3 status post I&D.  Wound culture is growing gram-positive cocci and gram-negative rods, anticipate some speciation today. Currently on broad-spectrum antibiotic. - Blood cultures remain negative, ID following  Active problems Chronic IVDU - On methadone , continue, no significant withdrawals  Chronic hep C - Outpatient follow-up recommended.   Hyponatremia - Likely from poor p.o. intake in setting of infection.  Sodium normalized   Microcytic anemia - H&H stable, hemoglobin 10.3.  Active smoker - Counseled to quit smoking. Nicotine  gum and nicotine  patch provided.   Mood disorder -continue medications as below  Scheduled Meds:  chlorhexidine    Topical Q8H   enoxaparin (LOVENOX) injection  40 mg Subcutaneous Q24H   gabapentin   600 mg Oral TID   methadone   180 mg Oral Daily   mirtazapine   30 mg Oral QHS   nicotine   14 mg Transdermal Daily   senna-docusate  2 tablet Oral BID   sertraline   150 mg Oral Daily   sodium chloride  flush  3 mL Intravenous Q12H   Continuous Infusions:  piperacillin -tazobactam (ZOSYN )  IV 3.375 g (05/11/24 0546)   vancomycin  1,250 mg (05/10/24 2317)   PRN Meds:.hydrOXYzine , nicotine   polacrilex, ondansetron  **OR** ondansetron  (ZOFRAN ) IV, oxyCODONE   Current Outpatient Medications  Medication Instructions   acetaminophen  (TYLENOL ) 650 mg, Oral, Every 6 hours PRN   cloNIDine  (CATAPRES ) 0.1 mg, Oral, 3 times daily PRN   COLACE 2-IN-1 8.6-50 MG tablet 1 tablet, Daily   gabapentin  (NEURONTIN ) 400 mg, Oral, 3 times daily, For agitation   hydrOXYzine  (ATARAX ) 50 mg, Oral, Every 6 hours PRN   ibuprofen  (ADVIL ) 600 mg, Oral, Daily PRN   methadone  (DOLOPHINE ) 180 mg, Daily   mirtazapine  (REMERON ) 30 mg, Oral, Daily at bedtime   sertraline  (ZOLOFT ) 200 mg, Oral, Daily    Diet Orders (From admission, onward)     Start     Ordered   05/08/24 1932  Diet regular Room service appropriate? Yes; Fluid consistency: Thin  Diet effective now       Question Answer Comment  Room service appropriate? Yes   Fluid consistency: Thin      05/08/24 1931            DVT prophylaxis: enoxaparin (LOVENOX) injection 40 mg Start: 05/08/24 1130   Lab Results  Component Value Date   PLT 626 (H) 05/10/2024      Code Status: Full Code  Family Communication: No family at bedside  Status is: Inpatient Remains inpatient appropriate because: Severity of illness  Level of care: Telemetry  Consultants:  ID Orthopedic surgery  Objective: Vitals:   05/10/24 0413 05/10/24 0828 05/10/24 2141 05/11/24 0458  BP: 108/67 100/75 117/72 108/84  Pulse: 65 64 80 73  Resp: 18 19 13  16  Temp: (!) 97.5 F (36.4 C) (!) 97.5 F (36.4 C) 98 F (36.7 C) (!) 97.5 F (36.4 C)  TempSrc:   Oral Oral  SpO2: 98% 100% 97% 100%  Weight:      Height:        Intake/Output Summary (Last 24 hours) at 05/11/2024 0905 Last data filed at 05/11/2024 0459 Gross per 24 hour  Intake 960 ml  Output 900 ml  Net 60 ml   Wt Readings from Last 3 Encounters:  05/08/24 77.1 kg  12/06/23 65.8 kg  07/29/21 72 kg    Examination:  Constitutional: NAD Eyes: lids and conjunctivae normal, no scleral  icterus ENMT: mmm Neck: normal, supple Respiratory: clear to auscultation bilaterally, no wheezing, no crackles.  Cardiovascular: Regular rate and rhythm, no murmurs / rubs / gallops.  Abdomen: soft, no distention, no tenderness. Bowel sounds positive.    Data Reviewed: I have independently reviewed following labs and imaging studies   CBC Recent Labs  Lab 05/08/24 0731 05/09/24 0513 05/10/24 0354  WBC 20.4* 13.2* 12.1*  HGB 10.1* 8.7* 10.3*  HCT 33.0* 28.6* 35.2*  PLT 524* 451* 626*  MCV 72.5* 73.7* 76.2*  MCH 22.2* 22.4* 22.3*  MCHC 30.6 30.4 29.3*  RDW 18.3* 18.4* 19.0*  LYMPHSABS 4.2*  --  5.3*  MONOABS 1.8*  --  0.7  EOSABS 0.2  --  0.5  BASOSABS 0.1  --  0.1    Recent Labs  Lab 05/08/24 0731 05/08/24 0745 05/08/24 1058 05/09/24 0513 05/10/24 0354  NA 129*  --   --  137 140  K 3.7  --   --  3.9 4.0  CL 97*  --   --  104 104  CO2 21*  --   --  24 23  GLUCOSE 143*  --   --  135* 90  BUN 7  --   --  7 9  CREATININE 0.57*  --   --  0.61 0.65  CALCIUM 8.5*  --   --  8.1* 8.3*  AST 29  --   --  25  --   ALT 25  --   --  20  --   ALKPHOS 81  --   --  80  --   BILITOT 0.5  --   --  0.3  --   ALBUMIN 3.0*  --   --  2.4*  --   CRP  --   --  16.7*  --   --   LATICACIDVEN  --  0.7  --   --   --   INR 1.0  --   --   --   --     ------------------------------------------------------------------------------------------------------------------ No results for input(s): CHOL, HDL, LDLCALC, TRIG, CHOLHDL, LDLDIRECT in the last 72 hours.  Lab Results  Component Value Date   HGBA1C 5.3 07/10/2021   ------------------------------------------------------------------------------------------------------------------ No results for input(s): TSH, T4TOTAL, T3FREE, THYROIDAB in the last 72 hours.  Invalid input(s): FREET3  Cardiac Enzymes No results for input(s): CKMB, TROPONINI, MYOGLOBIN in the last 168 hours.  Invalid input(s):  CK ------------------------------------------------------------------------------------------------------------------ No results found for: BNP  CBG: No results for input(s): GLUCAP in the last 168 hours.  Recent Results (from the past 240 hours)  Culture, blood (Routine x 2)     Status: None (Preliminary result)   Collection Time: 05/08/24  7:33 AM   Specimen: BLOOD  Result Value Ref Range Status   Specimen Description BLOOD  Final   Special Requests  Final    BOTTLES DRAWN AEROBIC AND ANAEROBIC Blood Culture adequate volume   Culture   Final    NO GROWTH 3 DAYS Performed at [facility], [address] [place], [place] [address]    Report Status PENDING  Incomplete  Culture, blood (Routine x 2)     Status: None (Preliminary result)   Collection Time: 05/08/24  8:03 AM   Specimen: BLOOD RIGHT ARM  Result Value Ref Range Status   Specimen Description BLOOD RIGHT ARM  Final   Special Requests   Final    BOTTLES DRAWN AEROBIC AND ANAEROBIC Blood Culture adequate volume   Culture   Final    NO GROWTH 3 DAYS Performed at [facility], [address] [place] [address], [place], [place] [address]    Report Status PENDING  Incomplete  Aerobic/Anaerobic Culture w Gram Stain (surgical/deep wound)     Status: None (Preliminary result)   Collection Time: 05/08/24  6:38 PM   Specimen: Wound  Result Value Ref Range Status   Specimen Description WOUND LEFT ARM  Final   Special Requests NONE  Final   Gram Stain   Final    FEW WBC PRESENT,BOTH PMN AND MONONUCLEAR MODERATE GRAM POSITIVE COCCI FEW GRAM NEGATIVE RODS Performed at [facility], [address] [place], [place] [address]    Culture   Final    CULTURE REINCUBATED FOR BETTER GROWTH NO ANAEROBES ISOLATED; CULTURE IN PROGRESS FOR 5 DAYS    Report Status PENDING  Incomplete     Radiology Studies: No results found.   Nilda Fendt, MD, PhD [facility]  Between 7 am - 7 pm I am available, please contact  me via Amion (for emergencies) or Securechat (non urgent messages)  Between 7 pm - 7 am I am not available, please contact night coverage MD/APP via Amion

## 2024-05-11 NOTE — Anesthesia Postprocedure Evaluation (Signed)
 Anesthesia Post Note  Patient: Justin Fisher  Procedure(s) Performed: IRRIGATION AND DEBRIDEMENT WOUND (Left: Arm Upper)     Patient location during evaluation: PACU Anesthesia Type: General Level of consciousness: awake and alert Pain management: pain level controlled Vital Signs Assessment: post-procedure vital signs reviewed and stable Respiratory status: spontaneous breathing, nonlabored ventilation and respiratory function stable Cardiovascular status: blood pressure returned to baseline and stable Postop Assessment: no apparent nausea or vomiting Anesthetic complications: no   No notable events documented.               Piper Albro

## 2024-05-11 NOTE — Plan of Care (Signed)
   Problem: Education: Goal: Knowledge of General Education information will improve Description Including pain rating scale, medication(s)/side effects and non-pharmacologic comfort measures Outcome: Progressing   Problem: Health Behavior/Discharge Planning: Goal: Ability to manage health-related needs will improve Outcome: Progressing

## 2024-05-11 NOTE — Progress Notes (Signed)
 Brief ID Note -   Cultures updated to reflect streptococcus intermedius - will narrow to ceftriaxone while further information becomes available. Hopeful to discharge with oral recommendations by Saturday.   He should follow up with ortho team in 5-7d after discharge.  ID will also plan to follow him to get hepatitis C treated.   Dr. Chyrl Fenton is covering Friday 11/07 and will help provide updates. Dr. Luiz to see him back this weekend.     Corean Fireman, MSN, NP-C [facility] for Infectious Disease [facility]  [email or web address] Pager: [phone] Office: [phone] [facility] Main Line: [phone] *Secure Chat Communication Welcome    Recent Results (from the past 240 hours)  Culture, blood (Routine x 2)     Status: None (Preliminary result)   Collection Time: 05/08/24  7:33 AM   Specimen: BLOOD  Result Value Ref Range Status   Specimen Description BLOOD  Final   Special Requests   Final    BOTTLES DRAWN AEROBIC AND ANAEROBIC Blood Culture adequate volume   Culture   Final    NO GROWTH 3 DAYS Performed at [facility], [address] [place], [place] [address]    Report Status PENDING  Incomplete  Culture, blood (Routine x 2)     Status: None (Preliminary result)   Collection Time: 05/08/24  8:03 AM   Specimen: BLOOD RIGHT ARM  Result Value Ref Range Status   Specimen Description BLOOD RIGHT ARM  Final   Special Requests   Final    BOTTLES DRAWN AEROBIC AND ANAEROBIC Blood Culture adequate volume   Culture   Final    NO GROWTH 3 DAYS Performed at [facility], [address], [place], [place] [address]    Report Status PENDING  Incomplete  Aerobic/Anaerobic Culture w Gram Stain (surgical/deep wound)     Status: None (Preliminary result)   Collection Time: 05/08/24  6:38 PM   Specimen: Wound  Result Value Ref Range Status   Specimen Description WOUND LEFT ARM  Final   Special Requests NONE  Final   Gram Stain    Final    FEW WBC PRESENT,BOTH PMN AND MONONUCLEAR MODERATE GRAM POSITIVE COCCI FEW GRAM NEGATIVE RODS    Culture   Final    FEW STREPTOCOCCUS INTERMEDIUS SUSCEPTIBILITIES TO FOLLOW Performed at [facility], [address], [place], [place] [address]    Report Status PENDING  Incomplete

## 2024-05-12 ENCOUNTER — Other Ambulatory Visit (HOSPITAL_COMMUNITY): Payer: Self-pay

## 2024-05-12 DIAGNOSIS — L03114 Cellulitis of left upper limb: Secondary | ICD-10-CM

## 2024-05-12 DIAGNOSIS — B182 Chronic viral hepatitis C: Secondary | ICD-10-CM | POA: Diagnosis not present

## 2024-05-12 DIAGNOSIS — F1910 Other psychoactive substance abuse, uncomplicated: Secondary | ICD-10-CM | POA: Diagnosis not present

## 2024-05-12 DIAGNOSIS — L02419 Cutaneous abscess of limb, unspecified: Secondary | ICD-10-CM | POA: Diagnosis not present

## 2024-05-12 DIAGNOSIS — L02414 Cutaneous abscess of left upper limb: Secondary | ICD-10-CM | POA: Diagnosis not present

## 2024-05-12 DIAGNOSIS — L03119 Cellulitis of unspecified part of limb: Secondary | ICD-10-CM | POA: Diagnosis not present

## 2024-05-12 LAB — HCV RNA (INTERNATIONAL UNITS)
HCV RNA (International Units): 46500000 [IU]/mL
HCV log10: 7.667 {Log_IU}/mL

## 2024-05-12 LAB — HEPATITIS B SURFACE ANTIBODY, QUANTITATIVE: Hep B S AB Quant (Post): 335 m[IU]/mL

## 2024-05-12 LAB — HCV RNA QUANT

## 2024-05-12 MED ORDER — AMOXICILLIN-POT CLAVULANATE 875-125 MG PO TABS
1.0000 | ORAL_TABLET | Freq: Two times a day (BID) | ORAL | 0 refills | Status: AC
  Filled 2024-05-12: qty 18, 9d supply, fill #0

## 2024-05-12 MED ORDER — AMOXICILLIN-POT CLAVULANATE 875-125 MG PO TABS
1.0000 | ORAL_TABLET | Freq: Two times a day (BID) | ORAL | Status: DC
  Administered 2024-05-13 – 2024-05-14 (×3): 1 via ORAL
  Filled 2024-05-12 (×3): qty 1

## 2024-05-12 NOTE — Progress Notes (Signed)
 PROGRESS NOTE  Justin Fisher FMW:[id number] DOB: 07/01/1986 DOA: 05/08/2024 PCP: Billy Philippe SAUNDERS, NP   LOS: 4 days   Brief Narrative / Interim history: Patient with PMH of anxiety, depression, hep C, active IV drug abuse, active smoker presented to the hospital with complaints of swelling of his left arm. Currently been treated for cellulitis and abscess.  Subjective / 24h Interval events: No chest pain, no nausea / vomiting  Assesement and Plan: Principal problem Left upper extremity wound with abscess status post IND, olecranon bursectomy and ulnar nerve neurolysis as well as flexor pronator mass tendon tenolysis -orthopedic surgery consulted, he was taken to the OR on 11/3 status post I&D.  Wound culture is growing strep intermedius, gram-positive cocci and gram-negative rods, final speciation/sensitivities pending - Blood cultures remain negative, ID following  Active problems Chronic IVDU - On methadone , continue, no significant withdrawals  Chronic hep C - Outpatient follow-up recommended.   Hyponatremia - Likely from poor p.o. intake in setting of infection.  Sodium normalized   Microcytic anemia -counts are stable  Active smoker - Counseled to quit smoking. Nicotine  gum and nicotine  patch provided.   Mood disorder -continue medications as below  Scheduled Meds:  chlorhexidine    Topical Q8H   enoxaparin (LOVENOX) injection  40 mg Subcutaneous Q24H   gabapentin   600 mg Oral TID   methadone   180 mg Oral Daily   mirtazapine   30 mg Oral QHS   nicotine   14 mg Transdermal Daily   senna-docusate  2 tablet Oral BID   sertraline   150 mg Oral Daily   sodium chloride  flush  3 mL Intravenous Q12H   Continuous Infusions:  cefTRIAXone (ROCEPHIN)  IV 2 g (05/11/24 1504)   PRN Meds:.hydrOXYzine , nicotine  polacrilex, ondansetron  **OR** ondansetron  (ZOFRAN ) IV, oxyCODONE   Current Outpatient Medications  Medication Instructions   acetaminophen  (TYLENOL ) 650 mg, Oral,  Every 6 hours PRN   cloNIDine  (CATAPRES ) 0.1 mg, Oral, 3 times daily PRN   COLACE 2-IN-1 8.6-50 MG tablet 1 tablet, Daily   gabapentin  (NEURONTIN ) 400 mg, Oral, 3 times daily, For agitation   hydrOXYzine  (ATARAX ) 50 mg, Oral, Every 6 hours PRN   ibuprofen  (ADVIL ) 600 mg, Oral, Daily PRN   methadone  (DOLOPHINE ) 180 mg, Daily   mirtazapine  (REMERON ) 30 mg, Oral, Daily at bedtime   sertraline  (ZOLOFT ) 200 mg, Oral, Daily    Diet Orders (From admission, onward)     Start     Ordered   05/08/24 1932  Diet regular Room service appropriate? Yes; Fluid consistency: Thin  Diet effective now       Question Answer Comment  Room service appropriate? Yes   Fluid consistency: Thin      05/08/24 1931            DVT prophylaxis: enoxaparin (LOVENOX) injection 40 mg Start: 05/08/24 1130   Lab Results  Component Value Date   PLT 626 (H) 05/10/2024      Code Status: Full Code  Family Communication: No family at bedside  Status is: Inpatient Remains inpatient appropriate because: Severity of illness  Level of care: Telemetry  Consultants:  ID Orthopedic surgery  Objective: Vitals:   05/11/24 0907 05/11/24 1648 05/11/24 2233 05/12/24 0751  BP: 122/68 109/75 108/69 103/72  Pulse: 96 65 70 71  Resp: 18 18 16 18   Temp: 97.9 F (36.6 C) 98.2 F (36.8 C) 98 F (36.7 C) 97.7 F (36.5 C)  TempSrc:      SpO2: 98% 99% 99% 99%  Weight:      Height:        Intake/Output Summary (Last 24 hours) at 05/12/2024 1122 Last data filed at 05/12/2024 0511 Gross per 24 hour  Intake 100 ml  Output --  Net 100 ml   Wt Readings from Last 3 Encounters:  05/08/24 77.1 kg  12/06/23 65.8 kg  07/29/21 72 kg    Examination:  Constitutional: NAD Eyes: lids and conjunctivae normal, no scleral icterus ENMT: mmm Neck: normal, supple Respiratory: clear to auscultation bilaterally, no wheezing, no crackles. Normal respiratory effort.  Cardiovascular: Regular rate and rhythm, no murmurs /  rubs / gallops. No LE edema. Abdomen: soft, no distention, no tenderness. Bowel sounds positive.    Data Reviewed: I have independently reviewed following labs and imaging studies   CBC Recent Labs  Lab 05/08/24 0731 05/09/24 0513 05/10/24 0354  WBC 20.4* 13.2* 12.1*  HGB 10.1* 8.7* 10.3*  HCT 33.0* 28.6* 35.2*  PLT 524* 451* 626*  MCV 72.5* 73.7* 76.2*  MCH 22.2* 22.4* 22.3*  MCHC 30.6 30.4 29.3*  RDW 18.3* 18.4* 19.0*  LYMPHSABS 4.2*  --  5.3*  MONOABS 1.8*  --  0.7  EOSABS 0.2  --  0.5  BASOSABS 0.1  --  0.1    Recent Labs  Lab 05/08/24 0731 05/08/24 0745 05/08/24 1058 05/09/24 0513 05/10/24 0354  NA 129*  --   --  137 140  K 3.7  --   --  3.9 4.0  CL 97*  --   --  104 104  CO2 21*  --   --  24 23  GLUCOSE 143*  --   --  135* 90  BUN 7  --   --  7 9  CREATININE 0.57*  --   --  0.61 0.65  CALCIUM 8.5*  --   --  8.1* 8.3*  AST 29  --   --  25  --   ALT 25  --   --  20  --   ALKPHOS 81  --   --  80  --   BILITOT 0.5  --   --  0.3  --   ALBUMIN 3.0*  --   --  2.4*  --   CRP  --   --  16.7*  --   --   LATICACIDVEN  --  0.7  --   --   --   INR 1.0  --   --   --   --     ------------------------------------------------------------------------------------------------------------------ No results for input(s): CHOL, HDL, LDLCALC, TRIG, CHOLHDL, LDLDIRECT in the last 72 hours.  Lab Results  Component Value Date   HGBA1C 5.3 07/10/2021   ------------------------------------------------------------------------------------------------------------------ No results for input(s): TSH, T4TOTAL, T3FREE, THYROIDAB in the last 72 hours.  Invalid input(s): FREET3  Cardiac Enzymes No results for input(s): CKMB, TROPONINI, MYOGLOBIN in the last 168 hours.  Invalid input(s): CK ------------------------------------------------------------------------------------------------------------------ No results found for: BNP  CBG: No results  for input(s): GLUCAP in the last 168 hours.  Recent Results (from the past 240 hours)  Culture, blood (Routine x 2)     Status: None (Preliminary result)   Collection Time: 05/08/24  7:33 AM   Specimen: BLOOD  Result Value Ref Range Status   Specimen Description BLOOD  Final   Special Requests   Final    BOTTLES DRAWN AEROBIC AND ANAEROBIC Blood Culture adequate volume   Culture   Final    NO GROWTH 4 DAYS Performed at  [facility], [address], [place], [place] [address]    Report Status PENDING  Incomplete  Culture, blood (Routine x 2)     Status: None (Preliminary result)   Collection Time: 05/08/24  8:03 AM   Specimen: BLOOD RIGHT ARM  Result Value Ref Range Status   Specimen Description BLOOD RIGHT ARM  Final   Special Requests   Final    BOTTLES DRAWN AEROBIC AND ANAEROBIC Blood Culture adequate volume   Culture   Final    NO GROWTH 4 DAYS Performed at [facility], [address], [place], [place] [address]    Report Status PENDING  Incomplete  Aerobic/Anaerobic Culture w Gram Stain (surgical/deep wound)     Status: None (Preliminary result)   Collection Time: 05/08/24  6:38 PM   Specimen: Wound  Result Value Ref Range Status   Specimen Description WOUND LEFT ARM  Final   Special Requests NONE  Final   Gram Stain   Final    FEW WBC PRESENT,BOTH PMN AND MONONUCLEAR MODERATE GRAM POSITIVE COCCI FEW GRAM NEGATIVE RODS    Culture   Final    FEW STREPTOCOCCUS INTERMEDIUS SUSCEPTIBILITIES TO FOLLOW Performed at [facility], [address], [place], [place] [address]    Report Status PENDING  Incomplete     Radiology Studies: No results found.   Nilda Fendt, MD, PhD [facility]  Between 7 am - 7 pm I am available, please contact me via Amion (for emergencies) or Securechat (non urgent messages)  Between 7 pm - 7 am I am not available, please contact night coverage MD/APP via Amion

## 2024-05-12 NOTE — Plan of Care (Signed)
  Problem: Clinical Measurements: Goal: Ability to maintain clinical measurements within normal limits will improve Outcome: Progressing   Problem: Nutrition: Goal: Adequate nutrition will be maintained Outcome: Progressing   Problem: Coping: Goal: Level of anxiety will decrease Outcome: Progressing   

## 2024-05-12 NOTE — TOC Progression Note (Signed)
 Transition of Care [facility]) - Progression Note    Patient Details  Name: Justin Fisher MRN: [id number] Date of Birth: 1986-01-27  Transition of Care [facility]) CM/SW Contact  Lendia Dais, [place] Phone Number: 05/12/2024, 12:44 PM  Clinical Narrative:  Pt is not yet medically stable. Pt is still receiving wound care every 8 hours and IV abx every 24 hours.   No current TOC needs.    Expected Discharge Plan: Home/Self Care Barriers to Discharge: Continued Medical Work up               Expected Discharge Plan and Services In-house Referral: Clinical Social Work     Living arrangements for the past 2 months: Single Family Home                                       Social Drivers of Health (SDOH) Interventions SDOH Screenings   Food Insecurity: Food Insecurity Present (05/08/2024)  Housing: Low Risk  (05/08/2024)  Transportation Needs: No Transportation Needs (05/08/2024)  Utilities: Not At Risk (05/08/2024)  Alcohol Screen: Low Risk  (12/06/2023)  Depression (PHQ2-9): Medium Risk (12/06/2023)  Financial Resource Strain: High Risk (12/06/2023)  Physical Activity: Sufficiently Active (12/06/2023)  Social Connections: Socially Isolated (05/08/2024)  Stress: No Stress Concern Present (12/06/2023)  Tobacco Use: High Risk (05/08/2024)  Health Literacy: Adequate Health Literacy (12/06/2023)    Readmission Risk Interventions     No data to display

## 2024-05-12 NOTE — Progress Notes (Signed)
    [facility]    Date of Admission:  05/08/2024      ID: Justin Fisher is a 38 y.o. male with   Principal Problem:   Cellulitis and abscess of upper extremity Active Problems:   Microcytic anemia   Hyponatremia   Leukocytosis   Hepatitis C   Polysubstance abuse (HCC)   IVDU (intravenous drug user)    Subjective: No complaints Feels like arm swelling is better  Medications:   chlorhexidine    Topical Q8H   enoxaparin (LOVENOX) injection  40 mg Subcutaneous Q24H   gabapentin   600 mg Oral TID   methadone   180 mg Oral Daily   mirtazapine   30 mg Oral QHS   nicotine   14 mg Transdermal Daily   senna-docusate  2 tablet Oral BID   sertraline   150 mg Oral Daily   sodium chloride  flush  3 mL Intravenous Q12H    Objective: Vital signs in last 24 hours: Temp:  [97.7 F (36.5 C)-98.2 F (36.8 C)] 97.7 F (36.5 C) (11/07 0751) Pulse Rate:  [65-71] 71 (11/07 0751) Resp:  [16-18] 18 (11/07 0751) BP: (103-109)/(69-75) 103/72 (11/07 0751) SpO2:  [99 %] 99 % (11/07 0751)   General appearance: alert, cooperative, and no distress Resp: clear to auscultation bilaterally Cardio: regular rate and rhythm GI: normal findings: bowel sounds normal and soft, non-tender Incision/Wound: LUE dressed, mild distal edema.   Lab Results Recent Labs    05/10/24 0354  WBC 12.1*  HGB 10.3*  HCT 35.2*  NA 140  K 4.0  CL 104  CO2 23  BUN 9  CREATININE 0.65   Liver Panel No results for input(s): PROT, ALBUMIN, AST, ALT, ALKPHOS, BILITOT, BILIDIR, IBILI in the last 72 hours. Sedimentation Rate No results for input(s): ESRSEDRATE in the last 72 hours. C-Reactive Protein No results for input(s): CRP in the last 72 hours.  Microbiology:  Studies/Results: No results found.   Assessment/Plan: LUE wound, abscess  Strep intermedius Hep C (VL ) Hep B immune Needs Hep A serology  Home with augmentin   To f/u with ID  clinic for treatment of Hep C BCx (-) Available as needed.   [facility] for Infectious Diseases Pager: [phone]  05/12/2024, 2:40 PM

## 2024-05-12 NOTE — Progress Notes (Signed)
 Postop left arm I&D Improving, resting comfortably in bed with minimal pain  No systemic symptoms Dressing removed, wound healing well, repacked and redressed  Continue soapy soaks, wound care  Encouraged elbow, wrist and finger range of motion, elevation  Antibiotics per ID Follow up 5-7 days after discharge

## 2024-05-13 ENCOUNTER — Other Ambulatory Visit (HOSPITAL_COMMUNITY): Payer: Self-pay

## 2024-05-13 DIAGNOSIS — L03119 Cellulitis of unspecified part of limb: Secondary | ICD-10-CM | POA: Diagnosis not present

## 2024-05-13 DIAGNOSIS — L02419 Cutaneous abscess of limb, unspecified: Secondary | ICD-10-CM | POA: Diagnosis not present

## 2024-05-13 LAB — AEROBIC/ANAEROBIC CULTURE W GRAM STAIN (SURGICAL/DEEP WOUND)

## 2024-05-13 LAB — CULTURE, BLOOD (ROUTINE X 2)
Culture: NO GROWTH
Culture: NO GROWTH
Special Requests: ADEQUATE
Special Requests: ADEQUATE

## 2024-05-13 LAB — HEPATITIS C GENOTYPE: HCV Genotype: 3

## 2024-05-13 LAB — HEPATITIS A ANTIBODY, TOTAL: hep A Total Ab: REACTIVE — AB

## 2024-05-13 MED ORDER — SERTRALINE HCL 100 MG PO TABS
150.0000 mg | ORAL_TABLET | Freq: Every day | ORAL | 0 refills | Status: AC
Start: 2024-05-13 — End: ?
  Filled 2024-05-13: qty 45, 30d supply, fill #0

## 2024-05-13 MED ORDER — HYDROXYZINE HCL 25 MG PO TABS
50.0000 mg | ORAL_TABLET | Freq: Four times a day (QID) | ORAL | 0 refills | Status: AC | PRN
  Filled 2024-05-13: qty 60, 7d supply, fill #0

## 2024-05-13 MED ORDER — NICOTINE 14 MG/24HR TD PT24
14.0000 mg | MEDICATED_PATCH | Freq: Every day | TRANSDERMAL | 0 refills | Status: AC
  Filled 2024-05-13: qty 28, 28d supply, fill #0

## 2024-05-13 MED ORDER — ORAL CARE MOUTH RINSE: 15.0000 mL | OROMUCOSAL | Status: DC | PRN

## 2024-05-13 MED ORDER — MIRTAZAPINE 30 MG PO TABS
30.0000 mg | ORAL_TABLET | Freq: Every day | ORAL | 0 refills | Status: AC
  Filled 2024-05-13: qty 30, 30d supply, fill #0

## 2024-05-13 MED ORDER — GABAPENTIN 300 MG PO CAPS
600.0000 mg | ORAL_CAPSULE | Freq: Three times a day (TID) | ORAL | 0 refills | Status: AC
  Filled 2024-05-13: qty 180, 30d supply, fill #0

## 2024-05-13 NOTE — Plan of Care (Signed)
  Problem: Nutrition: Goal: Adequate nutrition will be maintained Outcome: Progressing   Problem: Coping: Goal: Level of anxiety will decrease Outcome: Progressing   Problem: Pain Managment: Goal: General experience of comfort will improve and/or be controlled Outcome: Progressing   Problem: Skin Integrity: Goal: Risk for impaired skin integrity will decrease Outcome: Progressing

## 2024-05-13 NOTE — Progress Notes (Signed)
 PROGRESS NOTE  Justin Fisher FMW:[id number] DOB: 1985-09-17 DOA: 05/08/2024 PCP: Billy Philippe SAUNDERS, NP   LOS: 5 days   Brief Narrative / Interim history: Patient with PMH of anxiety, depression, hep C, active IV drug abuse, active smoker presented to the hospital with complaints of swelling of his left arm. Currently been treated for cellulitis and abscess.  Subjective / 24h Interval events: Feels well  Assesement and Plan: Principal problem Left upper extremity wound with abscess status post IND, olecranon bursectomy and ulnar nerve neurolysis as well as flexor pronator mass tendon tenolysis -orthopedic surgery consulted, he was taken to the OR on 11/3 status post I&D.  Wound culture is growing strep intermedius, gram-positive cocci and gram-negative rods, ID recommending Augmentin  up until 11/17.  Stable for discharge however methadone  clinic is closed this weekend and will leave the hospital tomorrow after getting morning methadone  dose  Active problems Chronic IVDU - On methadone , continue, no significant withdrawals  Chronic hep C - Outpatient follow-up recommended.   Hyponatremia - Likely from poor p.o. intake in setting of infection.  Sodium normalized   Microcytic anemia -counts are stable  Active smoker - Counseled to quit smoking. Nicotine  gum and nicotine  patch provided.   Mood disorder -continue medications as below  Scheduled Meds:  amoxicillin -clavulanate  1 tablet Oral Q12H   chlorhexidine    Topical Q8H   enoxaparin (LOVENOX) injection  40 mg Subcutaneous Q24H   gabapentin   600 mg Oral TID   methadone   180 mg Oral Daily   mirtazapine   30 mg Oral QHS   nicotine   14 mg Transdermal Daily   senna-docusate  2 tablet Oral BID   sertraline   150 mg Oral Daily   sodium chloride  flush  3 mL Intravenous Q12H   Continuous Infusions:   PRN Meds:.hydrOXYzine , nicotine  polacrilex, ondansetron  **OR** ondansetron  (ZOFRAN ) IV, mouth rinse, oxyCODONE   Current  Outpatient Medications  Medication Instructions   acetaminophen  (TYLENOL ) 650 mg, Oral, Every 6 hours PRN   amoxicillin -clavulanate (AUGMENTIN ) 875-125 MG tablet 1 tablet, Oral, Every 12 hours   cloNIDine  (CATAPRES ) 0.1 mg, Oral, 3 times daily PRN   COLACE 2-IN-1 8.6-50 MG tablet 1 tablet, Daily   gabapentin  (NEURONTIN ) 600 mg, Oral, 3 times daily, For agitation   hydrOXYzine  (ATARAX ) 50 mg, Oral, Every 6 hours PRN   ibuprofen  (ADVIL ) 600 mg, Oral, Daily PRN   methadone  (DOLOPHINE ) 180 mg, Daily   mirtazapine  (REMERON ) 30 mg, Oral, Daily at bedtime   [START ON 05/14/2024] nicotine  (NICODERM CQ  - DOSED IN MG/24 HOURS) 14 mg, Transdermal, Daily   sertraline  (ZOLOFT ) 150 mg, Oral, Daily    Diet Orders (From admission, onward)     Start     Ordered   05/08/24 1932  Diet regular Room service appropriate? Yes; Fluid consistency: Thin  Diet effective now       Question Answer Comment  Room service appropriate? Yes   Fluid consistency: Thin      05/08/24 1931            DVT prophylaxis: enoxaparin (LOVENOX) injection 40 mg Start: 05/08/24 1130   Lab Results  Component Value Date   PLT 626 (H) 05/10/2024      Code Status: Full Code  Family Communication: No family at bedside  Status is: Inpatient Remains inpatient appropriate because: Severity of illness  Level of care: Telemetry  Consultants:  ID Orthopedic surgery  Objective: Vitals:   05/12/24 1653 05/12/24 2038 05/13/24 0444 05/13/24 0841  BP: 110/65 114/78 116/70  109/70  Pulse: 84 98 80 82  Resp:  17 13 18   Temp: 98.6 F (37 C) 97.9 F (36.6 C) 98 F (36.7 C) 98 F (36.7 C)  TempSrc:  Oral Oral   SpO2: 98% 97% 98% 98%  Weight:      Height:        Intake/Output Summary (Last 24 hours) at 05/13/2024 1007 Last data filed at 05/13/2024 0600 Gross per 24 hour  Intake 1060 ml  Output 0 ml  Net 1060 ml   Wt Readings from Last 3 Encounters:  05/08/24 77.1 kg  12/06/23 65.8 kg  07/29/21 72 kg     Examination: Constitutional: NAD Eyes: lids and conjunctivae normal, no scleral icterus ENMT: mmm Neck: normal, supple Respiratory: clear to auscultation bilaterally, no wheezing, no crackles. Normal respiratory effort.  Cardiovascular: Regular rate and rhythm, no murmurs / rubs / gallops. No LE edema. Abdomen: soft, no distention, no tenderness. Bowel sounds positive.    Data Reviewed: I have independently reviewed following labs and imaging studies   CBC Recent Labs  Lab 05/08/24 0731 05/09/24 0513 05/10/24 0354  WBC 20.4* 13.2* 12.1*  HGB 10.1* 8.7* 10.3*  HCT 33.0* 28.6* 35.2*  PLT 524* 451* 626*  MCV 72.5* 73.7* 76.2*  MCH 22.2* 22.4* 22.3*  MCHC 30.6 30.4 29.3*  RDW 18.3* 18.4* 19.0*  LYMPHSABS 4.2*  --  5.3*  MONOABS 1.8*  --  0.7  EOSABS 0.2  --  0.5  BASOSABS 0.1  --  0.1    Recent Labs  Lab 05/08/24 0731 05/08/24 0745 05/08/24 1058 05/09/24 0513 05/10/24 0354  NA 129*  --   --  137 140  K 3.7  --   --  3.9 4.0  CL 97*  --   --  104 104  CO2 21*  --   --  24 23  GLUCOSE 143*  --   --  135* 90  BUN 7  --   --  7 9  CREATININE 0.57*  --   --  0.61 0.65  CALCIUM 8.5*  --   --  8.1* 8.3*  AST 29  --   --  25  --   ALT 25  --   --  20  --   ALKPHOS 81  --   --  80  --   BILITOT 0.5  --   --  0.3  --   ALBUMIN 3.0*  --   --  2.4*  --   CRP  --   --  16.7*  --   --   LATICACIDVEN  --  0.7  --   --   --   INR 1.0  --   --   --   --     ------------------------------------------------------------------------------------------------------------------ No results for input(s): CHOL, HDL, LDLCALC, TRIG, CHOLHDL, LDLDIRECT in the last 72 hours.  Lab Results  Component Value Date   HGBA1C 5.3 07/10/2021   ------------------------------------------------------------------------------------------------------------------ No results for input(s): TSH, T4TOTAL, T3FREE, THYROIDAB in the last 72 hours.  Invalid input(s):  FREET3  Cardiac Enzymes No results for input(s): CKMB, TROPONINI, MYOGLOBIN in the last 168 hours.  Invalid input(s): CK ------------------------------------------------------------------------------------------------------------------ No results found for: BNP  CBG: No results for input(s): GLUCAP in the last 168 hours.  Recent Results (from the past 240 hours)  Culture, blood (Routine x 2)     Status: None   Collection Time: 05/08/24  7:33 AM   Specimen: BLOOD  Result Value Ref Range  Status   Specimen Description BLOOD  Final   Special Requests   Final    BOTTLES DRAWN AEROBIC AND ANAEROBIC Blood Culture adequate volume   Culture   Final    NO GROWTH 5 DAYS Performed at [facility], [address] [place], [place] [address]    Report Status 05/13/2024 FINAL  Final  Culture, blood (Routine x 2)     Status: None   Collection Time: 05/08/24  8:03 AM   Specimen: BLOOD RIGHT ARM  Result Value Ref Range Status   Specimen Description BLOOD RIGHT ARM  Final   Special Requests   Final    BOTTLES DRAWN AEROBIC AND ANAEROBIC Blood Culture adequate volume   Culture   Final    NO GROWTH 5 DAYS Performed at [facility], [address] [place], [place] [address]    Report Status 05/13/2024 FINAL  Final  Aerobic/Anaerobic Culture w Gram Stain (surgical/deep wound)     Status: None (Preliminary result)   Collection Time: 05/08/24  6:38 PM   Specimen: Wound  Result Value Ref Range Status   Specimen Description WOUND LEFT ARM  Final   Special Requests NONE  Final   Gram Stain   Final    FEW WBC PRESENT,BOTH PMN AND MONONUCLEAR MODERATE GRAM POSITIVE COCCI FEW GRAM NEGATIVE RODS Performed at [facility], [address] [place], [place] [address]    Culture   Final    FEW STREPTOCOCCUS INTERMEDIUS NO ANAEROBES ISOLATED; CULTURE IN PROGRESS FOR 5 DAYS    Report Status PENDING  Incomplete   Organism ID, Bacteria STREPTOCOCCUS INTERMEDIUS  Final       Susceptibility   Streptococcus intermedius - MIC*    PENICILLIN <=0.06 SENSITIVE Sensitive     CEFTRIAXONE 0.25 SENSITIVE Sensitive     LEVOFLOXACIN <=0.25 SENSITIVE Sensitive     VANCOMYCIN  0.25 SENSITIVE Sensitive     * FEW STREPTOCOCCUS INTERMEDIUS     Radiology Studies: No results found.   Nilda Fendt, MD, PhD [facility]  Between 7 am - 7 pm I am available, please contact me via Amion (for emergencies) or Securechat (non urgent messages)  Between 7 pm - 7 am I am not available, please contact night coverage MD/APP via Amion

## 2024-05-14 ENCOUNTER — Other Ambulatory Visit (HOSPITAL_COMMUNITY): Payer: Self-pay

## 2024-05-14 DIAGNOSIS — L03119 Cellulitis of unspecified part of limb: Secondary | ICD-10-CM | POA: Diagnosis not present

## 2024-05-14 DIAGNOSIS — L02419 Cutaneous abscess of limb, unspecified: Secondary | ICD-10-CM | POA: Diagnosis not present

## 2024-05-14 NOTE — Plan of Care (Signed)
 Patient ID: Justin Fisher, male   DOB: [birth year], 38 y.o.   MRN: [id number]  Problem: Health Behavior/Discharge Planning: Goal: Ability to manage health-related needs will improve Outcome: Adequate for Discharge   Problem: Clinical Measurements: Goal: Ability to maintain clinical measurements within normal limits will improve Outcome: Adequate for Discharge Goal: Will remain free from infection Outcome: Adequate for Discharge Goal: Diagnostic test results will improve Outcome: Adequate for Discharge Goal: Respiratory complications will improve Outcome: Adequate for Discharge Goal: Cardiovascular complication will be avoided Outcome: Adequate for Discharge   Problem: Activity: Goal: Risk for activity intolerance will decrease Outcome: Adequate for Discharge   Problem: Nutrition: Goal: Adequate nutrition will be maintained Outcome: Adequate for Discharge   Problem: Coping: Goal: Level of anxiety will decrease Outcome: Adequate for Discharge   Problem: Elimination: Goal: Will not experience complications related to bowel motility Outcome: Adequate for Discharge Goal: Will not experience complications related to urinary retention Outcome: Adequate for Discharge   Problem: Pain Managment: Goal: General experience of comfort will improve and/or be controlled Outcome: Adequate for Discharge   Problem: Safety: Goal: Ability to remain free from injury will improve Outcome: Adequate for Discharge   Problem: Skin Integrity: Goal: Risk for impaired skin integrity will decrease Outcome: Adequate for Discharge   Verdie JONETTA Collier, RN

## 2024-05-14 NOTE — Plan of Care (Signed)

## 2024-05-14 NOTE — Discharge Summary (Signed)
 Physician Discharge Summary  Justin Fisher FMW:[id number] DOB: Oct 11, 1985 DOA: 05/08/2024  PCP: Billy Philippe SAUNDERS, NP  Admit date: 05/08/2024 Discharge date: 05/14/2024  Admitted From: home Disposition:  home  Recommendations for Outpatient Follow-up:  Follow up with PCP in 1-2 weeks  Home Health: none Equipment/Devices: none  Discharge Condition: stable CODE STATUS: Full code Diet Orders (From admission, onward)     Start     Ordered   05/08/24 1932  Diet regular Room service appropriate? Yes; Fluid consistency: Thin  Diet effective now       Question Answer Comment  Room service appropriate? Yes   Fluid consistency: Thin      05/08/24 1931            Brief Narrative / Interim history: Patient with PMH of anxiety, depression, hep C, active IV drug abuse, active smoker presented to the hospital with complaints of swelling of his left arm  Hospital Course / Discharge diagnoses: Principal problem Left upper extremity wound with abscess status post IND, olecranon bursectomy and ulnar nerve neurolysis as well as flexor pronator mass tendon tenolysis -orthopedic surgery consulted, he was taken to the OR on 11/3 status post I&D.  Wound culture is growing strep intermedius, gram-positive cocci and gram-negative rods, ID recommending Augmentin  up until 11/17.  He will be discharged in stable condition  Active problems Chronic IVDU - On methadone , continue, no significant withdrawals Chronic hep C - Outpatient follow-up recommended. Hyponatremia - Likely from poor p.o. intake in setting of infection.  Sodium normalized Microcytic anemia -counts are stable Active smoker - Counseled to quit smoking. Nicotine  gum and nicotine  patch provided. Mood disorder -continue medications as below  Sepsis ruled out   Discharge Instructions   Allergies as of 05/14/2024   No Known Allergies      Medication List     STOP taking these medications    cloNIDine  0.1 MG  tablet Commonly known as: CATAPRES        TAKE these medications    acetaminophen  325 MG tablet Commonly known as: TYLENOL  Take 2 tablets (650 mg total) by mouth every 6 (six) hours as needed for mild pain (or Fever >/= 101). What changed:  when to take this reasons to take this   amoxicillin -clavulanate 875-125 MG tablet Commonly known as: AUGMENTIN  Take 1 tablet by mouth every 12 (twelve) hours for 9 days.   Colace 2-IN-1 8.6-50 MG tablet Generic drug: senna-docusate Take 1 tablet by mouth daily.   gabapentin  300 MG capsule Commonly known as: NEURONTIN  Take 2 capsules (600 mg total) by mouth 3 (three) times daily. For agitation What changed:  medication strength how much to take   hydrOXYzine  25 MG tablet Commonly known as: ATARAX  Take 2 tablets (50 mg total) by mouth every 6 (six) hours as needed for anxiety. What changed: medication strength   ibuprofen  200 MG tablet Commonly known as: ADVIL  Take 600 mg by mouth daily as needed for mild pain (pain score 1-3) or moderate pain (pain score 4-6).   methadone  10 MG tablet Commonly known as: DOLOPHINE  Take 180 mg by mouth daily.   mirtazapine  30 MG tablet Commonly known as: REMERON  Take 1 tablet (30 mg total) by mouth at bedtime.   nicotine  14 mg/24hr patch Commonly known as: NICODERM CQ  - dosed in mg/24 hours Place 1 patch (14 mg total) onto the skin daily.   sertraline  100 MG tablet Commonly known as: ZOLOFT  Take 1.5 tablets (150 mg total) by mouth daily.  Consultations: Orthopedic surgery ID  Procedures/Studies:  CT FOREARM LEFT W CONTRAST Result Date: 05/10/2024 EXAM: CT LEFT HUMERUS AND LEFT FOREARM, WITH IV CONTRAST TECHNIQUE: Axial images were acquired through the left humerus and left forearm with IV contrast. Reformatted images were reviewed. Automated exposure control, iterative reconstruction, and/or weight based adjustment of the mA/kV was utilized to reduce the radiation dose to as low  as reasonably achievable. COMPARISON: Left shoulder radiographs 12/06/2023. CLINICAL HISTORY: probable abscess/soft tissue infection FINDINGS: BONES AND JOINTS (Left Humerus): Markedly severe flattening and resorption of most of the humeral head. Free floating rotator cuff anchor screws in the joint. Severe flattening and erosion of the glenoid. The remainder of the humeral head and proximal metaphysis is subluxed anteriorly adjacent to the flattened and elongated coracoid which is likewise mildly eroded. Erosion of the posterior humerus metaphysis adjacent to the flattened and elongated glenoid with pseudoarticulation between these structures as on image 45 series 3. Large shoulder joint effusion. Septic arthritis of the glenohumeral joint is not excluded given the degree of bony erosion and flattening. Suspected unfused ossification center of the medial epicondyle of the distal humerus with well corticated ossification center shown for example on image 33 series 8. SOFT TISSUES (Left Humerus): Substantial supraclavicular, subpectoral, and axillary adenopathy. Index axillary node 1.5 cm in short axis on image 66 series 4. Medial fluid density collection measuring 4.5 x 3.6 x 5.0 cm, probably an abscess, in the vicinity of the humeral head of the pronator teres muscle as on image 161 series 4. Circumferential subcutaneous edema at the elbow; this is less striking proximally in the upper arm although notable on the distal deltoid and also posteromedial in the mid humeral region. CT LEFT FOREARM: No significant bony abnormalities in the forearm. Diffuse circumferential subcutaneous edema is present throughout the forearm and extending into the wrist and dorsum of the hand. Aside from the intracubital fluid collection described above, no additional drainable fluid collection is identified. IMPRESSION: 1. 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 2. Medial antecubital  fluid collection at the elbow region measuring 4.5 x 3.6 x 5.0 cm, likely abscess. 3. Extensive regional lymphadenopathy involving supraclavicular, subpectoral, and axillary stations; index axillary node measures 1.5 cm in short axis. 4. Diffuse circumferential subcutaneous edema throughout the forearm, extending into the wrist and dorsum of the hand. Originally Nurse, mental health by: Ryan Salvage MD 05/10/2024 10:43 AM EST RP Workstation: [id number] Electronically signed by: Ryan Salvage MD 05/10/2024 12:27 PM EST RP Workstation: [id number]   CT HUMERUS LEFT W CONTRAST Result Date: 05/10/2024 EXAM: CT LEFT HUMERUS AND LEFT FOREARM, WITH IV CONTRAST TECHNIQUE: Axial images were acquired through the left humerus and left forearm with IV contrast. Reformatted images were reviewed. Automated exposure control, iterative reconstruction, and/or weight based adjustment of the mA/kV was utilized to reduce the radiation dose to as low as reasonably achievable. COMPARISON: Left shoulder radiographs 12/06/2023. CLINICAL HISTORY: probable abscess/soft tissue infection FINDINGS: BONES AND JOINTS (Left Humerus): Markedly severe flattening and resorption of most of the humeral head. Free floating rotator cuff anchor screws in the joint. Severe flattening and erosion of the glenoid. The remainder of the humeral head and proximal metaphysis is subluxed anteriorly adjacent to the flattened and elongated coracoid which is likewise mildly eroded. Erosion of the posterior humerus metaphysis adjacent to the flattened and elongated glenoid with pseudoarticulation between these structures as  on image 45 series 3. Large shoulder joint effusion. Septic arthritis of the glenohumeral joint is not excluded given the degree of bony erosion and flattening. Suspected unfused ossification center of the medial  epicondyle of the distal humerus with well corticated ossification center shown for example on image 33 series 8. SOFT TISSUES (Left Humerus): Substantial supraclavicular, subpectoral, and axillary adenopathy. Index axillary node 1.5 cm in short axis on image 66 series 4. Medial fluid density collection measuring 4.5 x 3.6 x 5.0 cm, probably an abscess, in the vicinity of the humeral head of the pronator teres muscle as on image 161 series 4. Circumferential subcutaneous edema at the elbow; this is less striking proximally in the upper arm although notable on the distal deltoid and also posteromedial in the mid humeral region. CT LEFT FOREARM: No significant bony abnormalities in the forearm. Diffuse circumferential subcutaneous edema is present throughout the forearm and extending into the wrist and dorsum of the hand. Aside from the intracubital fluid collection described above, no additional drainable fluid collection is identified. IMPRESSION: 1. 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 2. Medial antecubital fluid collection at the elbow region measuring 4.5 x 3.6 x 5.0 cm, likely abscess. 3. Extensive regional lymphadenopathy involving supraclavicular, subpectoral, and axillary stations; index axillary node measures 1.5 cm in short axis. 4. Diffuse circumferential subcutaneous edema throughout the forearm, extending into the wrist and dorsum of the hand. Electronically signed by: Ryan Salvage MD 05/10/2024 10:43 AM EST RP Workstation: [id number]     Subjective: - no chest pain, shortness of breath, no abdominal pain, nausea or vomiting.   Discharge Exam: BP 119/78   Pulse 88   Temp 98.1 F (36.7 C)   Resp 18   Ht 5' 7 (1.702 m)   Wt 74.9 kg   SpO2 96%   BMI 25.86 kg/m   General: Pt is alert, awake, not in acute  distress Cardiovascular: RRR, S1/S2 +, no rubs, no gallops Respiratory: CTA bilaterally, no wheezing, no rhonchi Abdominal: Soft, NT, ND, bowel sounds + Extremities: no edema, no cyanosis    The results of significant diagnostics from this hospitalization (including imaging, microbiology, ancillary and laboratory) are listed below for reference.     Microbiology: Recent Results (from the past 240 hours)  Culture, blood (Routine x 2)     Status: None   Collection Time: 05/08/24  7:33 AM   Specimen: BLOOD  Result Value Ref Range Status   Specimen Description BLOOD  Final   Special Requests   Final    BOTTLES DRAWN AEROBIC AND ANAEROBIC Blood Culture adequate volume   Culture   Final    NO GROWTH 5 DAYS Performed at [facility], [address] [place], [place] [address]    Report Status 05/13/2024 FINAL  Final  Culture, blood (Routine x 2)     Status: None   Collection Time: 05/08/24  8:03 AM   Specimen: BLOOD RIGHT ARM  Result Value Ref Range Status   Specimen Description BLOOD RIGHT ARM  Final   Special Requests   Final    BOTTLES DRAWN AEROBIC AND ANAEROBIC Blood Culture adequate volume   Culture   Final    NO GROWTH 5 DAYS Performed at [facility], [address] [place], [place] [address]    Report Status 05/13/2024 FINAL  Final  Aerobic/Anaerobic Culture w  Gram Stain (surgical/deep wound)     Status: None   Collection Time: 05/08/24  6:38 PM   Specimen: Wound  Result Value Ref Range Status   Specimen Description WOUND LEFT ARM  Final   Special Requests NONE  Final   Gram Stain   Final    FEW WBC PRESENT,BOTH PMN AND MONONUCLEAR MODERATE GRAM POSITIVE COCCI FEW GRAM NEGATIVE RODS    Culture   Final    FEW STREPTOCOCCUS INTERMEDIUS NO ANAEROBES ISOLATED Performed at [facility], [address], [place], [place] [address]    Report Status 05/13/2024 FINAL  Final   Organism ID, Bacteria STREPTOCOCCUS INTERMEDIUS  Final      Susceptibility    Streptococcus intermedius - MIC*    PENICILLIN <=0.06 SENSITIVE Sensitive     CEFTRIAXONE 0.25 SENSITIVE Sensitive     LEVOFLOXACIN <=0.25 SENSITIVE Sensitive     VANCOMYCIN  0.25 SENSITIVE Sensitive     * FEW STREPTOCOCCUS INTERMEDIUS     Labs: Basic Metabolic Panel: Recent Labs  Lab 05/08/24 0731 05/09/24 0513 05/10/24 0354  NA 129* 137 140  K 3.7 3.9 4.0  CL 97* 104 104  CO2 21* 24 23  GLUCOSE 143* 135* 90  BUN 7 7 9   CREATININE 0.57* 0.61 0.65  CALCIUM 8.5* 8.1* 8.3*   Liver Function Tests: Recent Labs  Lab 05/08/24 0731 05/09/24 0513  AST 29 25  ALT 25 20  ALKPHOS 81 80  BILITOT 0.5 0.3  PROT 7.9 6.7  ALBUMIN 3.0* 2.4*   CBC: Recent Labs  Lab 05/08/24 0731 05/09/24 0513 05/10/24 0354  WBC 20.4* 13.2* 12.1*  NEUTROABS 14.0*  --  5.4  HGB 10.1* 8.7* 10.3*  HCT 33.0* 28.6* 35.2*  MCV 72.5* 73.7* 76.2*  PLT 524* 451* 626*   CBG: No results for input(s): GLUCAP in the last 168 hours. Hgb A1c No results for input(s): HGBA1C in the last 72 hours. Lipid Profile No results for input(s): CHOL, HDL, LDLCALC, TRIG, CHOLHDL, LDLDIRECT in the last 72 hours. Thyroid  function studies No results for input(s): TSH, T4TOTAL, T3FREE, THYROIDAB in the last 72 hours.  Invalid input(s): FREET3 Urinalysis    Component Value Date/Time   COLORURINE YELLOW 06/13/2016 2352   APPEARANCEUR CLEAR 06/13/2016 2352   LABSPEC 1.010 06/13/2016 2352   PHURINE 5.0 06/13/2016 2352   GLUCOSEU NEGATIVE 06/13/2016 2352   HGBUR NEGATIVE 06/13/2016 2352   BILIRUBINUR NEGATIVE 06/13/2016 2352   KETONESUR NEGATIVE 06/13/2016 2352   PROTEINUR NEGATIVE 06/13/2016 2352   NITRITE NEGATIVE 06/13/2016 2352   LEUKOCYTESUR NEGATIVE 06/13/2016 2352    FURTHER DISCHARGE INSTRUCTIONS:   Get Medicines reviewed and adjusted: Please take all your medications with you for your next visit with your Primary MD   Laboratory/radiological data: Please request your  Primary MD to go over all hospital tests and procedure/radiological results at the follow up, please ask your Primary MD to get all Hospital records sent to his/her office.   In some cases, they will be blood work, cultures and biopsy results pending at the time of your discharge. Please request that your primary care M.D. goes through all the records of your hospital data and follows up on these results.   Also Note the following: If you experience worsening of your admission symptoms, develop shortness of breath, life threatening emergency, suicidal or homicidal thoughts you must seek medical attention immediately by calling 911 or calling your MD immediately  if symptoms less severe.   You must read complete  instructions/literature along with all the possible adverse reactions/side effects for all the Medicines you take and that have been prescribed to you. Take any new Medicines after you have completely understood and accpet all the possible adverse reactions/side effects.    Do not drive when taking Pain medications or sleeping medications (Benzodaizepines)   Do not take more than prescribed Pain, Sleep and Anxiety Medications. It is not advisable to combine anxiety,sleep and pain medications without talking with your primary care practitioner   Special Instructions: If you have smoked or chewed Tobacco  in the last 2 yrs please stop smoking, stop any regular Alcohol  and or any Recreational drug use.   Wear Seat belts while driving.   Please note: You were cared for by a hospitalist during your hospital stay. Once you are discharged, your primary care physician will handle any further medical issues. Please note that NO REFILLS for any discharge medications will be authorized once you are discharged, as it is imperative that you return to your primary care physician (or establish a relationship with a primary care physician if you do not have one) for your post hospital discharge needs so  that they can reassess your need for medications and monitor your lab values.  Time coordinating discharge: 35 minutes  SIGNED:  Nilda Fendt, MD, PhD 05/14/2024, 8:40 AM

## 2024-05-15 ENCOUNTER — Telehealth: Payer: Self-pay

## 2024-05-15 NOTE — Transitions of Care (Post Inpatient/ED Visit) (Unsigned)
   05/15/2024  Name: Justin Fisher MRN: [id number] DOB: [birth year]  Today's TOC FU Call Status: Today's TOC FU Call Status:: Unsuccessful Call (1st Attempt) Unsuccessful Call (1st Attempt) Date: 05/15/24  Attempted to reach the patient regarding the most recent Inpatient/ED visit.  Follow Up Plan: Additional outreach attempts will be made to reach the patient to complete the Transitions of Care (Post Inpatient/ED visit) call.   Signature Julian Lemmings, LPN [facility] Nurse Health Advisor Direct Dial [phone]

## 2024-05-16 NOTE — Transitions of Care (Post Inpatient/ED Visit) (Unsigned)
   05/16/2024  Name: Justin Fisher MRN: [id number] DOB: 1986/06/30  Today's TOC FU Call Status: Today's TOC FU Call Status:: Unsuccessful Call (2nd Attempt) Unsuccessful Call (1st Attempt) Date: 05/15/24 Unsuccessful Call (2nd Attempt) Date: 05/16/24  Attempted to reach the patient regarding the most recent Inpatient/ED visit.  Follow Up Plan: Additional outreach attempts will be made to reach the patient to complete the Transitions of Care (Post Inpatient/ED visit) call.   Signature Julian Lemmings, LPN [facility] Nurse Health Advisor Direct Dial [phone]

## 2024-05-17 NOTE — Transitions of Care (Post Inpatient/ED Visit) (Signed)
   05/17/2024  Name: Justin Fisher MRN: [id number] DOB: 07-Oct-1985  Today's TOC FU Call Status: Today's TOC FU Call Status:: Unsuccessful Call (3rd Attempt) Unsuccessful Call (1st Attempt) Date: 05/15/24 Unsuccessful Call (2nd Attempt) Date: 05/16/24 Unsuccessful Call (3rd Attempt) Date: 05/17/24  Attempted to reach the patient regarding the most recent Inpatient/ED visit.  Follow Up Plan: No further outreach attempts will be made at this time. We have been unable to contact the patient.  Signature Julian Lemmings, LPN [facility] Nurse Health Advisor Direct Dial [phone]
